# Patient Record
Sex: Female | Born: 1950
Health system: Southern US, Community
[De-identification: ages and names within clinical notes are randomized; demographics above are authoritative.]

## PROBLEM LIST (undated history)

## (undated) DIAGNOSIS — M94269 Chondromalacia, unspecified knee: Secondary | ICD-10-CM

## (undated) DIAGNOSIS — N762 Acute vulvitis: Secondary | ICD-10-CM

## (undated) DIAGNOSIS — IMO0001 Reserved for inherently not codable concepts without codable children: Secondary | ICD-10-CM

## (undated) DIAGNOSIS — J189 Pneumonia, unspecified organism: Secondary | ICD-10-CM

## (undated) DIAGNOSIS — I10 Essential (primary) hypertension: Secondary | ICD-10-CM

## (undated) DIAGNOSIS — N926 Irregular menstruation, unspecified: Secondary | ICD-10-CM

## (undated) DIAGNOSIS — R011 Cardiac murmur, unspecified: Secondary | ICD-10-CM

## (undated) DIAGNOSIS — K5790 Diverticulosis of intestine, part unspecified, without perforation or abscess without bleeding: Secondary | ICD-10-CM

## (undated) DIAGNOSIS — IMO0002 Reserved for concepts with insufficient information to code with codable children: Secondary | ICD-10-CM

## (undated) DIAGNOSIS — Z8679 Personal history of other diseases of the circulatory system: Secondary | ICD-10-CM

## (undated) DIAGNOSIS — K648 Other hemorrhoids: Secondary | ICD-10-CM

## (undated) DIAGNOSIS — M659 Synovitis and tenosynovitis, unspecified: Secondary | ICD-10-CM

## (undated) DIAGNOSIS — I2510 Atherosclerotic heart disease of native coronary artery without angina pectoris: Secondary | ICD-10-CM

## (undated) DIAGNOSIS — M329 Systemic lupus erythematosus, unspecified: Secondary | ICD-10-CM

## (undated) DIAGNOSIS — K219 Gastro-esophageal reflux disease without esophagitis: Secondary | ICD-10-CM

## (undated) DIAGNOSIS — R7302 Impaired glucose tolerance (oral): Secondary | ICD-10-CM

## (undated) DIAGNOSIS — I639 Cerebral infarction, unspecified: Secondary | ICD-10-CM

## (undated) DIAGNOSIS — M65969 Unspecified synovitis and tenosynovitis, unspecified lower leg: Secondary | ICD-10-CM

## (undated) DIAGNOSIS — E7800 Pure hypercholesterolemia, unspecified: Secondary | ICD-10-CM

## (undated) DIAGNOSIS — Z8619 Personal history of other infectious and parasitic diseases: Secondary | ICD-10-CM

## (undated) DIAGNOSIS — D649 Anemia, unspecified: Secondary | ICD-10-CM

## (undated) DIAGNOSIS — I219 Acute myocardial infarction, unspecified: Secondary | ICD-10-CM

## (undated) DIAGNOSIS — Z8673 Personal history of transient ischemic attack (TIA), and cerebral infarction without residual deficits: Secondary | ICD-10-CM

## (undated) DIAGNOSIS — E669 Obesity, unspecified: Secondary | ICD-10-CM

## (undated) DIAGNOSIS — E119 Type 2 diabetes mellitus without complications: Secondary | ICD-10-CM

## (undated) DIAGNOSIS — R896 Abnormal cytological findings in specimens from other organs, systems and tissues: Secondary | ICD-10-CM

## (undated) DIAGNOSIS — Z8639 Personal history of other endocrine, nutritional and metabolic disease: Secondary | ICD-10-CM

## (undated) DIAGNOSIS — D219 Benign neoplasm of connective and other soft tissue, unspecified: Secondary | ICD-10-CM

## (undated) HISTORY — PX: CORONARY ARTERY BYPASS GRAFT: SHX141

## (undated) HISTORY — DX: Acute vulvitis: N76.2

## (undated) HISTORY — PX: CARDIAC CATHETERIZATION: SHX172

## (undated) HISTORY — DX: Pneumonia, unspecified organism: J18.9

## (undated) HISTORY — PX: ROTATOR CUFF REPAIR: SHX139

## (undated) HISTORY — DX: Impaired glucose tolerance (oral): R73.02

## (undated) HISTORY — DX: Type 2 diabetes mellitus without complications: E11.9

## (undated) HISTORY — DX: Personal history of other diseases of the circulatory system: Z86.79

## (undated) HISTORY — DX: Synovitis and tenosynovitis, unspecified: M65.9

## (undated) HISTORY — PX: TUBAL LIGATION: SHX77

## (undated) HISTORY — DX: Essential (primary) hypertension: I10

## (undated) HISTORY — DX: Benign neoplasm of connective and other soft tissue, unspecified: D21.9

## (undated) HISTORY — DX: Irregular menstruation, unspecified: N92.6

## (undated) HISTORY — DX: Personal history of other infectious and parasitic diseases: Z86.19

## (undated) HISTORY — DX: Abnormal cytological findings in specimens from other organs, systems and tissues: R89.6

## (undated) HISTORY — DX: Diverticulosis of intestine, part unspecified, without perforation or abscess without bleeding: K57.90

## (undated) HISTORY — DX: Chondromalacia, unspecified knee: M94.269

## (undated) HISTORY — DX: Reserved for inherently not codable concepts without codable children: IMO0001

## (undated) HISTORY — PX: CATARACT EXTRACTION, BILATERAL: SHX1313

## (undated) HISTORY — DX: Unspecified synovitis and tenosynovitis, unspecified lower leg: M65.969

## (undated) HISTORY — DX: Acute myocardial infarction, unspecified: I21.9

## (undated) HISTORY — DX: Other hemorrhoids: K64.8

## (undated) HISTORY — DX: Systemic lupus erythematosus, unspecified: M32.9

## (undated) HISTORY — DX: Personal history of other endocrine, nutritional and metabolic disease: Z86.39

## (undated) HISTORY — DX: Anemia, unspecified: D64.9

## (undated) HISTORY — DX: Obesity, unspecified: E66.9

---

## 1998-02-02 ENCOUNTER — Inpatient Hospital Stay (HOSPITAL_COMMUNITY): Admission: AD | Admit: 1998-02-02 | Discharge: 1998-02-04 | Payer: Self-pay | Admitting: Cardiology

## 1998-03-24 ENCOUNTER — Emergency Department (HOSPITAL_COMMUNITY): Admission: EM | Admit: 1998-03-24 | Discharge: 1998-03-24 | Payer: Self-pay | Admitting: Emergency Medicine

## 1998-04-02 ENCOUNTER — Encounter: Admission: RE | Admit: 1998-04-02 | Discharge: 1998-07-01 | Payer: Self-pay | Admitting: Sports Medicine

## 1998-07-09 ENCOUNTER — Ambulatory Visit (HOSPITAL_COMMUNITY): Admission: RE | Admit: 1998-07-09 | Discharge: 1998-07-09 | Payer: Self-pay | Admitting: Nephrology

## 1998-07-20 ENCOUNTER — Ambulatory Visit (HOSPITAL_COMMUNITY): Admission: RE | Admit: 1998-07-20 | Discharge: 1998-07-20 | Payer: Self-pay | Admitting: Interventional Cardiology

## 1998-08-05 ENCOUNTER — Ambulatory Visit (HOSPITAL_COMMUNITY): Admission: RE | Admit: 1998-08-05 | Discharge: 1998-08-05 | Payer: Self-pay | Admitting: Interventional Cardiology

## 1998-12-03 ENCOUNTER — Ambulatory Visit (HOSPITAL_BASED_OUTPATIENT_CLINIC_OR_DEPARTMENT_OTHER): Admission: RE | Admit: 1998-12-03 | Discharge: 1998-12-03 | Payer: Self-pay | Admitting: Orthopedic Surgery

## 1998-12-10 ENCOUNTER — Ambulatory Visit (HOSPITAL_COMMUNITY): Admission: RE | Admit: 1998-12-10 | Discharge: 1998-12-10 | Payer: Self-pay | Admitting: Nephrology

## 1998-12-14 ENCOUNTER — Encounter: Admission: RE | Admit: 1998-12-14 | Discharge: 1999-03-14 | Payer: Self-pay | Admitting: Orthopedic Surgery

## 1998-12-23 ENCOUNTER — Encounter: Payer: Self-pay | Admitting: Nephrology

## 1998-12-23 ENCOUNTER — Inpatient Hospital Stay (HOSPITAL_COMMUNITY): Admission: AD | Admit: 1998-12-23 | Discharge: 1998-12-25 | Payer: Self-pay | Admitting: *Deleted

## 1999-03-10 ENCOUNTER — Ambulatory Visit (HOSPITAL_COMMUNITY): Admission: RE | Admit: 1999-03-10 | Discharge: 1999-03-10 | Payer: Self-pay | Admitting: Obstetrics and Gynecology

## 1999-03-10 ENCOUNTER — Encounter: Payer: Self-pay | Admitting: Obstetrics and Gynecology

## 1999-06-08 ENCOUNTER — Ambulatory Visit (HOSPITAL_COMMUNITY): Admission: RE | Admit: 1999-06-08 | Discharge: 1999-06-08 | Payer: Self-pay | Admitting: Nephrology

## 1999-07-27 ENCOUNTER — Encounter: Payer: Self-pay | Admitting: Obstetrics and Gynecology

## 1999-07-27 ENCOUNTER — Ambulatory Visit (HOSPITAL_COMMUNITY): Admission: RE | Admit: 1999-07-27 | Discharge: 1999-07-27 | Payer: Self-pay | Admitting: Obstetrics and Gynecology

## 1999-08-02 ENCOUNTER — Encounter: Payer: Self-pay | Admitting: Obstetrics and Gynecology

## 1999-08-02 ENCOUNTER — Ambulatory Visit (HOSPITAL_COMMUNITY): Admission: RE | Admit: 1999-08-02 | Discharge: 1999-08-02 | Payer: Self-pay | Admitting: Obstetrics and Gynecology

## 1999-10-12 ENCOUNTER — Encounter: Payer: Self-pay | Admitting: Interventional Cardiology

## 1999-10-12 ENCOUNTER — Ambulatory Visit (HOSPITAL_COMMUNITY): Admission: RE | Admit: 1999-10-12 | Discharge: 1999-10-12 | Payer: Self-pay | Admitting: Interventional Cardiology

## 1999-11-18 ENCOUNTER — Other Ambulatory Visit: Admission: RE | Admit: 1999-11-18 | Discharge: 1999-11-18 | Payer: Self-pay | Admitting: Obstetrics and Gynecology

## 1999-11-29 ENCOUNTER — Encounter: Payer: Self-pay | Admitting: Neurology

## 1999-11-29 ENCOUNTER — Ambulatory Visit (HOSPITAL_COMMUNITY): Admission: RE | Admit: 1999-11-29 | Discharge: 1999-11-29 | Payer: Self-pay | Admitting: Neurology

## 1999-12-17 ENCOUNTER — Emergency Department (HOSPITAL_COMMUNITY): Admission: EM | Admit: 1999-12-17 | Discharge: 1999-12-17 | Payer: Self-pay | Admitting: Emergency Medicine

## 2000-03-29 ENCOUNTER — Encounter (INDEPENDENT_AMBULATORY_CARE_PROVIDER_SITE_OTHER): Payer: Self-pay

## 2000-03-29 ENCOUNTER — Other Ambulatory Visit: Admission: RE | Admit: 2000-03-29 | Discharge: 2000-03-29 | Payer: Self-pay | Admitting: Obstetrics and Gynecology

## 2000-03-29 HISTORY — PX: OTHER SURGICAL HISTORY: SHX169

## 2000-08-02 ENCOUNTER — Encounter: Payer: Self-pay | Admitting: Obstetrics and Gynecology

## 2000-08-02 ENCOUNTER — Ambulatory Visit (HOSPITAL_COMMUNITY): Admission: RE | Admit: 2000-08-02 | Discharge: 2000-08-02 | Payer: Self-pay | Admitting: Obstetrics and Gynecology

## 2000-10-31 DIAGNOSIS — I7090 Unspecified atherosclerosis: Secondary | ICD-10-CM | POA: Insufficient documentation

## 2000-11-30 ENCOUNTER — Other Ambulatory Visit: Admission: RE | Admit: 2000-11-30 | Discharge: 2000-11-30 | Payer: Self-pay | Admitting: Obstetrics and Gynecology

## 2001-02-12 ENCOUNTER — Other Ambulatory Visit: Admission: RE | Admit: 2001-02-12 | Discharge: 2001-02-12 | Payer: Self-pay | Admitting: Obstetrics and Gynecology

## 2001-05-16 ENCOUNTER — Ambulatory Visit (HOSPITAL_COMMUNITY): Admission: RE | Admit: 2001-05-16 | Discharge: 2001-05-16 | Payer: Self-pay | Admitting: Nephrology

## 2001-05-16 ENCOUNTER — Encounter: Payer: Self-pay | Admitting: Nephrology

## 2001-07-17 ENCOUNTER — Encounter: Payer: Self-pay | Admitting: Nephrology

## 2001-07-17 ENCOUNTER — Ambulatory Visit (HOSPITAL_COMMUNITY): Admission: RE | Admit: 2001-07-17 | Discharge: 2001-07-17 | Payer: Self-pay | Admitting: Nephrology

## 2001-08-07 ENCOUNTER — Ambulatory Visit (HOSPITAL_COMMUNITY): Admission: RE | Admit: 2001-08-07 | Discharge: 2001-08-07 | Payer: Self-pay | Admitting: Obstetrics and Gynecology

## 2001-08-07 ENCOUNTER — Encounter: Payer: Self-pay | Admitting: Obstetrics and Gynecology

## 2001-12-12 ENCOUNTER — Other Ambulatory Visit: Admission: RE | Admit: 2001-12-12 | Discharge: 2001-12-12 | Payer: Self-pay | Admitting: Obstetrics and Gynecology

## 2002-08-08 ENCOUNTER — Encounter: Payer: Self-pay | Admitting: Obstetrics and Gynecology

## 2002-08-08 ENCOUNTER — Ambulatory Visit (HOSPITAL_COMMUNITY): Admission: RE | Admit: 2002-08-08 | Discharge: 2002-08-08 | Payer: Self-pay

## 2002-12-11 ENCOUNTER — Ambulatory Visit (HOSPITAL_COMMUNITY): Admission: RE | Admit: 2002-12-11 | Discharge: 2002-12-11 | Payer: Self-pay | Admitting: Gastroenterology

## 2002-12-11 ENCOUNTER — Encounter (INDEPENDENT_AMBULATORY_CARE_PROVIDER_SITE_OTHER): Payer: Self-pay | Admitting: *Deleted

## 2002-12-11 DIAGNOSIS — IMO0001 Reserved for inherently not codable concepts without codable children: Secondary | ICD-10-CM

## 2002-12-11 HISTORY — PX: UPPER GASTROINTESTINAL ENDOSCOPY: SHX188

## 2002-12-11 HISTORY — DX: Reserved for inherently not codable concepts without codable children: IMO0001

## 2002-12-17 ENCOUNTER — Other Ambulatory Visit: Admission: RE | Admit: 2002-12-17 | Discharge: 2002-12-17 | Payer: Self-pay | Admitting: Obstetrics and Gynecology

## 2003-03-17 ENCOUNTER — Encounter: Admission: RE | Admit: 2003-03-17 | Discharge: 2003-04-03 | Payer: Self-pay | Admitting: Occupational Medicine

## 2003-03-27 ENCOUNTER — Ambulatory Visit (HOSPITAL_COMMUNITY): Admission: RE | Admit: 2003-03-27 | Discharge: 2003-03-28 | Payer: Self-pay | Admitting: Interventional Cardiology

## 2003-03-27 ENCOUNTER — Encounter: Payer: Self-pay | Admitting: Interventional Cardiology

## 2003-03-29 ENCOUNTER — Inpatient Hospital Stay (HOSPITAL_COMMUNITY): Admission: EM | Admit: 2003-03-29 | Discharge: 2003-04-12 | Payer: Self-pay

## 2003-03-30 ENCOUNTER — Encounter: Payer: Self-pay | Admitting: Surgery

## 2003-03-31 ENCOUNTER — Encounter: Payer: Self-pay | Admitting: Surgery

## 2003-04-01 ENCOUNTER — Encounter: Payer: Self-pay | Admitting: Surgery

## 2003-04-04 ENCOUNTER — Encounter: Payer: Self-pay | Admitting: Surgery

## 2003-04-08 ENCOUNTER — Encounter: Payer: Self-pay | Admitting: Surgery

## 2003-05-05 ENCOUNTER — Encounter (HOSPITAL_COMMUNITY): Admission: RE | Admit: 2003-05-05 | Discharge: 2003-05-15 | Payer: Self-pay | Admitting: Interventional Cardiology

## 2003-05-19 ENCOUNTER — Encounter (HOSPITAL_COMMUNITY): Admission: RE | Admit: 2003-05-19 | Discharge: 2003-08-17 | Payer: Self-pay | Admitting: Interventional Cardiology

## 2003-08-12 ENCOUNTER — Encounter: Payer: Self-pay | Admitting: Obstetrics and Gynecology

## 2003-08-12 ENCOUNTER — Ambulatory Visit (HOSPITAL_COMMUNITY): Admission: RE | Admit: 2003-08-12 | Discharge: 2003-08-12 | Payer: Self-pay | Admitting: Obstetrics and Gynecology

## 2004-01-13 ENCOUNTER — Emergency Department (HOSPITAL_COMMUNITY): Admission: AD | Admit: 2004-01-13 | Discharge: 2004-01-13 | Payer: Self-pay | Admitting: Family Medicine

## 2004-04-07 ENCOUNTER — Other Ambulatory Visit: Admission: RE | Admit: 2004-04-07 | Discharge: 2004-04-07 | Payer: Self-pay | Admitting: Obstetrics and Gynecology

## 2004-08-24 ENCOUNTER — Ambulatory Visit (HOSPITAL_COMMUNITY): Admission: RE | Admit: 2004-08-24 | Discharge: 2004-08-24 | Payer: Self-pay | Admitting: Nephrology

## 2005-05-17 ENCOUNTER — Other Ambulatory Visit: Admission: RE | Admit: 2005-05-17 | Discharge: 2005-05-17 | Payer: Self-pay | Admitting: Obstetrics and Gynecology

## 2005-08-30 ENCOUNTER — Ambulatory Visit (HOSPITAL_COMMUNITY): Admission: RE | Admit: 2005-08-30 | Discharge: 2005-08-30 | Payer: Self-pay | Admitting: Obstetrics and Gynecology

## 2005-12-04 ENCOUNTER — Emergency Department (HOSPITAL_COMMUNITY): Admission: EM | Admit: 2005-12-04 | Discharge: 2005-12-04 | Payer: Self-pay | Admitting: Emergency Medicine

## 2006-03-01 ENCOUNTER — Encounter: Payer: Self-pay | Admitting: Interventional Cardiology

## 2006-07-25 ENCOUNTER — Encounter: Admission: RE | Admit: 2006-07-25 | Discharge: 2006-07-25 | Payer: Self-pay | Admitting: Obstetrics and Gynecology

## 2006-09-19 ENCOUNTER — Encounter: Admission: RE | Admit: 2006-09-19 | Discharge: 2006-09-19 | Payer: Self-pay | Admitting: Occupational Medicine

## 2006-09-27 ENCOUNTER — Other Ambulatory Visit: Admission: RE | Admit: 2006-09-27 | Discharge: 2006-09-27 | Payer: Self-pay | Admitting: Obstetrics and Gynecology

## 2007-01-08 ENCOUNTER — Ambulatory Visit (HOSPITAL_COMMUNITY): Admission: RE | Admit: 2007-01-08 | Discharge: 2007-01-08 | Payer: Self-pay | Admitting: Orthopedic Surgery

## 2007-08-02 ENCOUNTER — Ambulatory Visit (HOSPITAL_COMMUNITY): Admission: RE | Admit: 2007-08-02 | Discharge: 2007-08-02 | Payer: Self-pay | Admitting: Obstetrics and Gynecology

## 2007-11-18 ENCOUNTER — Emergency Department (HOSPITAL_COMMUNITY): Admission: EM | Admit: 2007-11-18 | Discharge: 2007-11-18 | Payer: Self-pay | Admitting: Emergency Medicine

## 2008-01-01 ENCOUNTER — Encounter: Admission: RE | Admit: 2008-01-01 | Discharge: 2008-01-01 | Payer: Self-pay | Admitting: Internal Medicine

## 2008-05-13 ENCOUNTER — Encounter (HOSPITAL_COMMUNITY): Admission: RE | Admit: 2008-05-13 | Discharge: 2008-07-02 | Payer: Self-pay | Admitting: Interventional Cardiology

## 2008-05-23 ENCOUNTER — Emergency Department (HOSPITAL_COMMUNITY): Admission: EM | Admit: 2008-05-23 | Discharge: 2008-05-23 | Payer: Self-pay | Admitting: Emergency Medicine

## 2008-07-01 DIAGNOSIS — J189 Pneumonia, unspecified organism: Secondary | ICD-10-CM

## 2008-07-01 HISTORY — DX: Pneumonia, unspecified organism: J18.9

## 2008-08-04 ENCOUNTER — Encounter: Admission: RE | Admit: 2008-08-04 | Discharge: 2008-08-04 | Payer: Self-pay | Admitting: Obstetrics and Gynecology

## 2008-08-19 ENCOUNTER — Ambulatory Visit (HOSPITAL_COMMUNITY): Admission: RE | Admit: 2008-08-19 | Discharge: 2008-08-19 | Payer: Self-pay | Admitting: Internal Medicine

## 2009-07-03 ENCOUNTER — Emergency Department (HOSPITAL_COMMUNITY): Admission: EM | Admit: 2009-07-03 | Discharge: 2009-07-03 | Payer: Self-pay | Admitting: Family Medicine

## 2009-07-26 ENCOUNTER — Emergency Department (HOSPITAL_COMMUNITY): Admission: EM | Admit: 2009-07-26 | Discharge: 2009-07-26 | Payer: Self-pay | Admitting: Emergency Medicine

## 2009-08-10 ENCOUNTER — Encounter: Admission: RE | Admit: 2009-08-10 | Discharge: 2009-08-10 | Payer: Self-pay | Admitting: Obstetrics and Gynecology

## 2010-02-08 ENCOUNTER — Emergency Department (HOSPITAL_COMMUNITY): Admission: EM | Admit: 2010-02-08 | Discharge: 2010-02-08 | Payer: Self-pay | Admitting: Family Medicine

## 2010-02-28 DIAGNOSIS — Z8619 Personal history of other infectious and parasitic diseases: Secondary | ICD-10-CM

## 2010-02-28 HISTORY — DX: Personal history of other infectious and parasitic diseases: Z86.19

## 2010-06-17 ENCOUNTER — Encounter (INDEPENDENT_AMBULATORY_CARE_PROVIDER_SITE_OTHER): Payer: Self-pay | Admitting: Interventional Cardiology

## 2010-06-17 ENCOUNTER — Ambulatory Visit (HOSPITAL_COMMUNITY): Admission: RE | Admit: 2010-06-17 | Discharge: 2010-06-17 | Payer: Self-pay | Admitting: Interventional Cardiology

## 2010-06-17 ENCOUNTER — Ambulatory Visit: Payer: Self-pay | Admitting: Vascular Surgery

## 2010-08-17 ENCOUNTER — Encounter: Admission: RE | Admit: 2010-08-17 | Discharge: 2010-08-17 | Payer: Self-pay | Admitting: Nephrology

## 2010-11-21 ENCOUNTER — Encounter: Payer: Self-pay | Admitting: Obstetrics and Gynecology

## 2010-11-21 ENCOUNTER — Encounter: Payer: Self-pay | Admitting: Orthopedic Surgery

## 2011-02-27 ENCOUNTER — Ambulatory Visit (INDEPENDENT_AMBULATORY_CARE_PROVIDER_SITE_OTHER): Payer: 59

## 2011-02-27 ENCOUNTER — Emergency Department (HOSPITAL_COMMUNITY): Payer: 59

## 2011-02-27 ENCOUNTER — Emergency Department (HOSPITAL_COMMUNITY)
Admission: EM | Admit: 2011-02-27 | Discharge: 2011-02-27 | Disposition: A | Discharge status: Home / Self Care | Payer: 59 | Attending: Emergency Medicine | Admitting: Emergency Medicine

## 2011-02-27 ENCOUNTER — Inpatient Hospital Stay (INDEPENDENT_AMBULATORY_CARE_PROVIDER_SITE_OTHER)
Admission: RE | Admit: 2011-02-27 | Discharge: 2011-02-27 | Disposition: A | Discharge status: Home / Self Care | Payer: 59 | Source: Ambulatory Visit | Attending: Family Medicine | Admitting: Family Medicine

## 2011-02-27 DIAGNOSIS — R509 Fever, unspecified: Secondary | ICD-10-CM

## 2011-02-27 DIAGNOSIS — E7800 Pure hypercholesterolemia, unspecified: Secondary | ICD-10-CM | POA: Insufficient documentation

## 2011-02-27 DIAGNOSIS — M329 Systemic lupus erythematosus, unspecified: Secondary | ICD-10-CM | POA: Insufficient documentation

## 2011-02-27 DIAGNOSIS — J029 Acute pharyngitis, unspecified: Secondary | ICD-10-CM | POA: Insufficient documentation

## 2011-02-27 DIAGNOSIS — I10 Essential (primary) hypertension: Secondary | ICD-10-CM | POA: Insufficient documentation

## 2011-02-27 DIAGNOSIS — I252 Old myocardial infarction: Secondary | ICD-10-CM | POA: Insufficient documentation

## 2011-02-27 DIAGNOSIS — Z951 Presence of aortocoronary bypass graft: Secondary | ICD-10-CM | POA: Insufficient documentation

## 2011-02-27 LAB — POCT URINALYSIS DIP (DEVICE)
Bilirubin Urine: NEGATIVE
Hgb urine dipstick: NEGATIVE
Ketones, ur: NEGATIVE mg/dL
pH: 5 (ref 5.0–8.0)

## 2011-02-27 LAB — CBC
HCT: 33.3 % — ABNORMAL LOW (ref 36.0–46.0)
Hemoglobin: 10.8 g/dL — ABNORMAL LOW (ref 12.0–15.0)
MCH: 27.8 pg (ref 26.0–34.0)
MCHC: 32.4 g/dL (ref 30.0–36.0)

## 2011-02-27 MED ORDER — IOHEXOL 300 MG/ML  SOLN
100.0000 mL | Freq: Once | INTRAMUSCULAR | Status: AC | PRN
  Administered 2011-02-27: 80 mL via INTRAVENOUS

## 2011-03-18 NOTE — Cardiovascular Report (Signed)
   NAME:  Deanna Simmons, Deanna Simmons NO.:  [id number]   MEDICAL RECORD NO.:  [id number]                   PATIENT TYPE:  REC   LOCATION:                                       FACILITY:  [facility]   PHYSICIAN:  Lesleigh Noe, M.D.            DATE OF BIRTH:  03/08/1951   DATE OF PROCEDURE:  DATE OF DISCHARGE:                              CARDIAC CATHETERIZATION   INDICATIONS:  Acute inferior myocardial infarction status post recent drug  eluting stent implantation for in-stent restenosis.   PROCEDURE PERFORMED:  1. Left heart catheterization.  2. Rescue angioplasty of the right coronary.  3. Failed AngioJet thrombectomy of the right coronary.   DESCRIPTION OF PROCEDURE:  The patient had undergone stent implantation on  Mar 27, 2003 as treatment for in-stent restenosis of the right coronary.  She was treated with overlapping Cypher stents.  She was discharged on Mar 28, 2003 and returned acutely on Mar 29, 2003 with onset of chest pain  abruptly.  In the ER she had tombstone ST elevation and was quickly prepped  and brought to the catheterization laboratory.  In the catheterization  laboratory   Dictation Ended At This Point.                                                Lesleigh Noe, M.D.    HWS/MEDQ  D:  06/16/2003  T:  06/17/2003  Job:  [id number]   cc:   Fayrene Fearing L. Deterding, M.D.  [address]  [place]  [place] [address]  Fax: [phone]

## 2011-03-18 NOTE — Op Note (Signed)
NAME:  Deanna Simmons, Deanna Simmons                         ACCOUNT NO.:  [id number]   MEDICAL RECORD NO.:  [id number]                   PATIENT TYPE:  INP   LOCATION:  2399                                 FACILITY:  [facility]   PHYSICIAN:  Evelene Croon, M.D.                  DATE OF BIRTH:  1951/03/23   DATE OF PROCEDURE:  03/30/2003  DATE OF DISCHARGE:                                 OPERATIVE REPORT   PREOPERATIVE DIAGNOSIS:  Two vessel coronary disease, status post acute  right coronary artery stent thrombosis.   POSTOPERATIVE DIAGNOSIS:  Two vessel coronary disease, status post acute  right coronary artery stent thrombosis.   OPERATION:  Median sternotomy, extracorporeal circulation, coronary artery  bypass graft surgery x1 using the saphenous vein graft to the posterior  descending coronary artery, endoscopic vein harvesting from the left thigh.   SURGEON:  Evelene Croon, M.D.   ASSISTANT:  Villa Herb, P.A.-C.   ANESTHESIA:  General endotracheal anesthesia.   CLINICAL HISTORY:  This patient is a 60 year old woman with a long history  of coronary disease who has undergone multiple percutaneous interventions on  the right coronary artery since 1991, by Dr. Katrinka Blazing.  The patient underwent  stenting of her right coronary artery last Thursday with Cypher stents due  to in-stent restenosis and de novo high grade stenosis between 2 stents that  had already been placed.   The patient returned last night with acute right coronary artery stent  thrombosis. She was taken emergently to the cath lab and Dr. Katrinka Blazing was able  to successfully open the right coronary artery with TIMII-III flow but some  residual thrombus.  The patient remained stable on Integrelin, heparin and  nitroglycerin.  After reviewing the angiogram, it was felt that coronary  artery bypass graft surgery was the best treatment to prevent further  ischemia and infarction.   Review of her old catheterization from last Thursday  showed that there was  also about 50% ostial stenosis of a second marginal branch. I discussed the  operative procedure of coronary bypass surgery with the patient and her  husband, and children; including alternatives, benefits and risks including  bleeding, blood transfusion, infection, graft failure, stroke, myocardial  infarction and death.  She understood and agreed to proceed.  She was anemic  and blood bank notified us this morning that she had antibodies in her  blood.  She had a previous blood transfusion. This took several hours for  the blood bank to identify the antibodies and to find blood that was  suitable for her.   DESCRIPTION OF PROCEDURE:  The patient was taken to the operating room,  placed on the table in the supine position.  After induction of general  endotracheal anesthesia, a Foley catheter was placed in the bladder using  sterile technique.  Then the chest, abdomen and both lower extremities were  prepped  draped in  the usual sterile manner.   The chest was entered through a median sternotomy incision and the  pericardium was opened in the midline. She had old pericarditis with dense  adhesions completely obliterating the pericardial cavity.  At the same time,  a segment of the greater saphenous vein was harvested from the left leg  using endoscopic vein harvest technique.  There was approximately 1 scissor  length of vein in the upper thigh which was of good size and good quality.  Below this, the vein became small and very diffusely diseased, and I did not  feel it was suitable for using to bypass the obtuse marginal branch. This  vessel only had about 50% stenosis and certainly, was not clinically  significant at this time. Given the quality of the vein, and the fact that  it would require a stented dissection on the lateral wall of the heart; due  to the adhesions, I did not feel that the benefit outweighed the risk.  Therefore, plans were made only to  graft the posterior descending coronary  artery.   Then dissection was performed to expose the right atrium and ascending  aorta. Then the patient was heparinized and when an adequate active clotting  time was achieved, the distal ascending aorta was cannulated using a 20  French aortic cannula for arterial inflow.  Venous outflow was achieved  using a 2 stage venous cannula through the right atrial appendage.  An  antegrade cardioplegia and vent cannula was inserted in the aortic root.   The patient was placed on cardiopulmonary bypass. The inferior wall of the  heart was dissected from the adhesions enough to expose the posterior  descending coronary artery. This was a medium sized vessel.  Then the aorta  was cross-clamped and 600 cc of cold blood antegrade cardioplegia was  administered in the aortic root with quick arrest of the heart.  The patient  was kept at normothermia. Topical hypothermic iced saline was used.   Then the distal anastomosis was performed of the posterior descending  coronary artery. The internal diameter of this vessel was about 1.75 mm. The  conduit used was a segment of greater saphenous vein and the anastomosis  performed in an end-to-side manner using continuous 7-0 Prolene suture. Flow  was administered through the graft and was excellent.  Then the proximal  anastomosis was performed to the aortic root in a end-to-side manner using  continuous 6-0 Prolene suture.  The cross-clamp was then removed, with a  time of 20 minutes.  There was spontaneous return of sinus rhythm. The  proximal and distal anastomoses appeared hemostatic and lie of the graft was  satisfactory.  A graft marker was placed around the proximal anastomosis.  Two temporary right ventricular and right atrial pacing wires were placed  above the skin.   When the patient had rewarmed to 37 degrees Centigrade, she was weaned from cardiopulmonary bypass on no inotropic agents.  Total bypass  time was 36  minutes.  Cardiac function appeared excellent, with cardiac output 4.5  L/minute.  Protamine was given, and the aortic and venous cannulas were  removed without difficulty.  Hemostasis was achieved. The patient was given  Aprotinin for this case, and she was on heparin, Plavix, aspirin and  Integrelin. Then a single chest tube was placed in the anterior mediastinum.  The sternum was then closed with #6 stainless steel wires.  The fascia was  closed with continuous #1 Vicryl suture, subcutaneous tissue was closed  with  continuous 2-0 Vicryl and skin with 3-0 Vicryl subcuticular closure. The  lower extremity vein harvest sites were closed in layers in a similar  manner.  The sponge, instrument and needle counts were correct according to  the scrub  nurse. Dry sterile dressings were applied around the incisions around the  chest tubes, which were hooked to Pleur-evac suction. The patient remained  hemodynamically stable and was transported to the SICU in guarded but stable  condition.                                                  Evelene Croon, M.D.    BB/MEDQ  D:  03/30/2003  T:  03/30/2003  Job:  [id number]   cc:   Lyn Records III, M.D.  [address]  [address]  [place]  [place] [address]  Fax: [phone]   Cath Lab  [facility]

## 2011-03-18 NOTE — Consult Note (Signed)
NAME:  Deanna Simmons, Deanna Simmons                         ACCOUNT NO.:  [id number]   MEDICAL RECORD NO.:  [id number]                   PATIENT TYPE:  INP   LOCATION:  2924                                 FACILITY:  [facility]   PHYSICIAN:  Evelene Croon, M.D.                  DATE OF BIRTH:  29-Oct-1951   DATE OF CONSULTATION:  03/30/2003  DATE OF DISCHARGE:                                   CONSULTATION   REFERRING PHYSICIAN:  Lyn Records, M.D.   REASON FOR CONSULTATION:  Two-vessel coronary artery disease with acute  inferior MI secondary to right coronary artery stent thrombosis.   HISTORY OF PRESENT ILLNESS:  This patient is a 60 year old black female with  history of coronary artery disease who has undergone multiple percutaneous  interventions on the right coronary artery since 1991.  Most recently on Mar 27, 2003, she returned with restenosis in her right coronary artery and had  60 mm overlapping Cipher stents in the right coronary artery to treat this  restenosis as well as a de novo high-grade mid right coronary artery  stenosis between two previously placed stents.  She has been on Plavix and  aspirin.  About 7:15 p.m. last night, she developed sudden severe chest  pressure, nausea and diaphoresis that was unrelieved by sublingual  nitroglycerin.  She came to the emergency room and was diagnosed with an  acute inferior MI with ST elevations inferiorly.  Emergent catheterization  was performed which showed acute stent thrombosis initial the right coronary  artery.  This was successfully opened to less than 20% stenosis with diffuse  irregularity within the stents and thrombus present.  There was TIMI-3 flow.  Her symptoms resolved.  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  She has remained stable overnight on IV Integrilin, nitroglycerin,  and Plavix.   PAST MEDICAL HISTORY:  1. Significant for coronary disease mentioned above, status post multiple  percutaneous interventions.  2. She has history of hypercholesterolemia.  3. Status post hysterectomy.  4. Status post right rotator cuff repair.   REVIEW OF SYSTEMS:  General:  She denies any recent weight changes.  She  denies any fever or chills.  Eyes negative.  HEENT:  Negative.  Endocrine:  She denies diabetes or hypothyroidism.  Cardiovascular:  As above.  She  denies PND and orthopnea.  She has had no peripheral edema or palpitations.  Respiratory:  She denies cough or sputum production.  GI:  She has had no  nausea or vomiting.  She denies melena and bright red blood per rectum.  GU:  She denies dysuria and hematuria.  Neurological:  She has had no focal  weakness or numbness.  She denies dizziness and syncope.  She has never had  a TIA or stroke.  Musculoskeletal:  Negative.  Vascular:  She denies  claudication and phlebitis.   ALLERGIES:  PENICILLIN, TICLID.   FAMILY HISTORY:  Strongly positive for coronary disease in both her parents.  A brother died in his late 61s of myocardial infarction.   SOCIAL HISTORY:  She works in Herbalist at [employer].  She is a  nonsmoker.   PHYSICAL EXAMINATION:  VITAL SIGNS:  Blood pressure 165/85, pulse 65 and  regular, respiratory rate 16 and nonlabored.  Oxygen saturation is 100% on 2  L nasal cannula.  GENERAL:  She is an obese black female in no distress.  HEENT:  Normocephalic, atraumatic.  Pupils are equal and reactive to light  and accommodation.  Extraocular muscles are intact.  Throat is clear.  NECK:  Normal carotid pulses bilaterally.  No bruits.  There is no  adenopathy or thyromegaly.  CARDIAC:  Regular rate and rhythm with normal S1 and S2.  No murmur, rub, or  gallop.  LUNGS:  Clear.  ABDOMEN:  Active bowel sounds.  Abdomen is soft and obese.  No palpable  masses or organomegaly.  EXTREMITIES:  No peripheral edema.  There is some oozing from the right  groin where her sheath is located.  NEUROLOGIC:  Alert and  oriented x 3.  Motor and sensory exam grossly normal.  SKIN:  Warm and dry.   IMPRESSION:  [name] has two-vessel coronary disease, status post acute  right coronary artery stent thrombosis opened with percutaneous  intervention.  She is at high risk for rethrombosis.  I agree with  proceeding with coronary artery bypass graft surgery as the best treatment  to prevent this complication.  I will probably also plan on bypassing her  second obtuse marginal branch which has about 50% ostial stenosis.  I  discussed the operative procedure with the patient including alternatives,  benefits and risks including bleeding, blood transfusion, infection, stroke,  myocardial infarction, and death.  She understands and agrees to proceed.  Will plan to do this sometime this morning.                                                Evelene Croon, M.D.    BB/MEDQ  D:  03/30/2003  T:  03/30/2003  Job:  [id number]   cc:   Lyn Records III, M.D.  [address]  [address]  [place]  [place] [address]  Fax: [phone]

## 2011-03-18 NOTE — Discharge Summary (Signed)
NAME:  Deanna Simmons, Deanna Simmons                         ACCOUNT NO.:  [id number]   MEDICAL RECORD NO.:  [id number]                   PATIENT TYPE:  INP   LOCATION:  2041                                 FACILITY:  [facility]   PHYSICIAN:  Ranelle Oyster, M.D.             DATE OF BIRTH:  07-15-1951   DATE OF ADMISSION:  03/29/2003  DATE OF DISCHARGE:  04/12/2003                                 DISCHARGE SUMMARY   ADMISSION DIAGNOSIS:  Coronary artery disease with subacute stent thrombosis  of the right coronary artery.   PAST MEDICAL HISTORY:  1. Coronary artery disease, status post multiple percutaneous interventions     since 1991.  2. Hypercholesteremia.  3. Lupus erythematosus, followed  by Dr. Darrick Penna.   PAST SURGICAL HISTORY:  1. Hysterectomy.  2. Right rotator cuff repair.   ALLERGIES:  PENICILLIN AND TICLID.   POSTOPERATIVE DIAGNOSIS:  Two-vessel coronary artery disease, status post  acute right coronary artery stent thrombosis, status post coronary artery  bypass grafting (emergent ).   BRIEF HISTORY:  Deanna Simmons is a 60 year old African American female. As  above she has had multiple  percutaneous interventions in the right coronary  artery since 1991, most recently on Mar 27, 2003. She had a 60-mm  overlapping Cypher stents placed in the right coronary artery to treat  restenosis of her right coronary artery. In the evening of April 29, 2003,  she developed sudden severe chest pressure, nausea and diaphoresis which was  unrelieved by her sublingual nitroglycerin.   HOSPITAL COURSE:  She came to the emergency room at [facility] An  admitting EKG revealed changes consistent with an acute inferior myocardial  infarction. She was taken emergently to the cardiac catheterization lab. The  catheterization revealed acute stent thrombosis in the right coronary  artery. This was successfully opened to less than 20% of stenosis with  diffuse irregularity within the stent  and thrombus present.   Because of these findings, a cardiac surgery consult was requested. Dr.  Laneta Simmers evaluated her early the next morning. Dr. Laneta Simmers felt that Mrs.  Simmons was at a high risk for re thrombosis of her stents, therefore he  recommended proceeding with coronary artery bypass grafting. The procedure  and its benefits were discussed  with Deanna Simmons and she agreed with the  surgery.   Later that day Deanna Simmons was brought to the operating room and underwent  the following  surgical procedure with Dr. Laneta Simmers:  Coronary artery bypass  grafting x1, the saphenous vein was grafted to the posterior descending  artery. The findings at the time of the procedure included old pericarditis  with diffuse disease and pericardial adhesions. She had only 1 vein graft  placed because there was only enough  good vein to graft on the posterior  descending artery.   She tolerated this procedure reasonably well, transferring in stable  condition to the SICU. She  remained hemodynamically stable in the immediate  postoperative period. She was  extubated several hours after arrival on the intensive care unit and awoke  from anesthesia neurologically intact. Postoperative issues have been mainly  anemia and fever.   On postoperative day #4 her hemoglobin was 7.1. She was transfused with 1  unit of packed cells. The next morning her temperature was  101. Her left  leg incision was clean and dry but there was some erythema noted. She was  started on a course of vancomycin for position wound infection for  the  following  findings. Her symptoms of cellulitis cleared up very quickly,  however, her temperature continued to persist intermittently.   On postoperative day #9, she had a temperature spike to 102.9. Again on  postoperative day #9 her hemoglobin was 7.1. She was typed and crossed and  transfused with 2 units of packed cells. During the type and cross matching,  Dr. Luisa Hart found  that she had high levels of 2 very significant antibodies  that were not present when her blood was cross matched in the perioperative  period. He felt that she must have had an undetectable level  of these  antibodies and then transfusion for surgery acted as a booster. This could  be a reason for her fevers as well as her drop in hemoglobin. Deanna Simmons  was afebrile on the morning of postoperative day #10 and has remained that  way since.   Because of her persistent elevated temperature  and her history of lupus, a  primary care consultation  was requested. Her primary care physician for  this is Dr. Darrick Penna. His partner, Dr. Marjory Sneddon consulted with  her.  Since the blood antibody seemed to be a probable cause of her fever.  Dr. Kathrene Bongo felt that lupus was probably not really causing the fever,  however, she felt it was prudent to run compliment levels.  The results of  these blood tests were not immediately available.   Deanna Simmons is now making very good progress in recovery from her surgery.  On the morning of April 11, 2003, postoperative day #12, she was feeling  pretty well. Her blood pressure is 100/70. She had a low-grade temperature  of 100.1.  Her lungs were clear. Her heart is in normal sinus rhythm. Her  incisions are clean and dry with no signs of infection. Her ambulation is  improving. Her pain is well controlled. Her vancomycin was discontinued  today. If she remains afebrile overnight, she will be ready for discharge to  home in the morning.  Her hemoglobin will be checked prior to discharge.   LABORATORY DATA:  On April 10, 2003, CBC, white blood cells 10.2, hemoglobin  8.8, hematocrit 24.6, platelets 302. Chemistries:  Sodium 140, potassium  4.0, BUN 11, creatinine 0.8, glucose  104. Calcium 9.0.   CONDITION ON DISCHARGE:  Improved.   DISCHARGE MEDICATIONS:  Toprol XL 25 mg p.o. every day. Resume the  following  home medications:  1. Lipitor 80  mg p.o. every day.  2. Enteric coated aspirin 325 mg p.o. every day.  3. Iron, multivitamin and Tums she used prior to admission.  4. For pain she can have Tylox 1 to  2 tablets q.4-6h. p.r.n. pain or     Tylenol  325 mg 1 to  2 tablets q.4-6h. p.r.n. mild pain.   DISCHARGE INSTRUCTIONS:  For activity she has been asked to refrain from any  driving, heavy lifting, pushing or  pulling. Also she was instructed to  continue her breathing exercises and daily walking. Her diet should be a  heart healthy diet. Wound care she may shower at home.  If the incisions  become red, hot, swollen or draining, she is to call Dr. Sharee Pimple office.   FOLLOW UP:  1. Dr. Katrinka Blazing would like to see her in his  office in approximately 2 weeks.     She has been asked to call to arrange an appointment. She will have a     chest x-ray within that day.  2.     Dr. Laneta Simmers would like to see her  at the [facility] office on Tuesday, May 06, 2003, at 11:30 a.m.  3. She has been instructed to follow up with Dr. Darrick Penna in approximately     1 month to follow up on his compliment blood tests.                                               Ranelle Oyster, M.D.    ZTS/MEDQ  D:  04/11/2003  T:  04/12/2003  Job:  [id number]   cc:   Lyn Records III, M.D.  [address]  [address]  [place]  [place] [address]  Fax: [phone]   Llana Aliment. Deterding, M.D.  [address]  [place]  [place] [address]  Fax: [phone]

## 2011-05-02 ENCOUNTER — Encounter: Payer: 59 | Attending: Internal Medicine

## 2011-07-01 ENCOUNTER — Inpatient Hospital Stay (INDEPENDENT_AMBULATORY_CARE_PROVIDER_SITE_OTHER)
Admission: RE | Admit: 2011-07-01 | Discharge: 2011-07-01 | Disposition: A | Discharge status: Home / Self Care | Payer: 59 | Source: Ambulatory Visit | Attending: Emergency Medicine | Admitting: Emergency Medicine

## 2011-07-01 DIAGNOSIS — J309 Allergic rhinitis, unspecified: Secondary | ICD-10-CM

## 2011-07-01 DIAGNOSIS — R05 Cough: Secondary | ICD-10-CM

## 2011-07-01 DIAGNOSIS — R059 Cough, unspecified: Secondary | ICD-10-CM

## 2011-08-01 ENCOUNTER — Other Ambulatory Visit: Payer: Self-pay | Admitting: Obstetrics and Gynecology

## 2011-08-01 DIAGNOSIS — Z1231 Encounter for screening mammogram for malignant neoplasm of breast: Secondary | ICD-10-CM

## 2011-08-23 ENCOUNTER — Ambulatory Visit
Admission: RE | Admit: 2011-08-23 | Discharge: 2011-08-23 | Disposition: A | Discharge status: Home / Self Care | Payer: 59 | Source: Ambulatory Visit | Attending: Obstetrics and Gynecology | Admitting: Obstetrics and Gynecology

## 2011-08-23 DIAGNOSIS — Z1231 Encounter for screening mammogram for malignant neoplasm of breast: Secondary | ICD-10-CM

## 2011-09-11 ENCOUNTER — Emergency Department (INDEPENDENT_AMBULATORY_CARE_PROVIDER_SITE_OTHER)
Admission: EM | Admit: 2011-09-11 | Discharge: 2011-09-11 | Disposition: A | Discharge status: Home / Self Care | Payer: 59 | Source: Home / Self Care | Attending: Emergency Medicine | Admitting: Emergency Medicine

## 2011-09-11 DIAGNOSIS — R05 Cough: Secondary | ICD-10-CM

## 2011-09-11 DIAGNOSIS — J040 Acute laryngitis: Secondary | ICD-10-CM

## 2011-09-11 DIAGNOSIS — R059 Cough, unspecified: Secondary | ICD-10-CM

## 2011-09-11 HISTORY — DX: Atherosclerotic heart disease of native coronary artery without angina pectoris: I25.10

## 2011-09-11 HISTORY — DX: Pure hypercholesterolemia, unspecified: E78.00

## 2011-09-11 MED ORDER — DOXYCYCLINE HYCLATE 100 MG PO CAPS: 100.0000 mg | ORAL_CAPSULE | Freq: Two times a day (BID) | ORAL | Status: AC

## 2011-09-11 MED ORDER — PREDNISONE 20 MG PO TABS: 20.0000 mg | ORAL_TABLET | Freq: Every day | ORAL | Status: AC

## 2011-09-11 NOTE — ED Provider Notes (Addendum)
History     CSN: [id number] Arrival date & time: 09/11/2011  7:52 PM   First MD Initiated Contact with Patient 09/11/11 1915      Chief Complaint  Patient presents with  . URI    Pt has URI and was seen at PCP and now has laryngitis    (Consider location/radiation/quality/duration/timing/severity/associated sxs/prior treatment) Patient is a 60 y.o. female presenting with URI. The history is provided by the patient.  URI The primary symptoms include sore throat and cough. Primary symptoms do not include fever, nausea or rash. Primary symptoms comment: hoarssness The problem has not changed since onset. The cough is recurrent. The cough is non-productive. The sputum is clear.  Symptoms associated with the illness include congestion. The illness is not associated with chills.    Past Medical History  Diagnosis Date  . Asthma   . Coronary artery disease   . Hypercholesteremia     Past Surgical History  Procedure Date  . Coronary artery bypass graft   . Rotator cuff repair     History reviewed. No pertinent family history.  History  Substance Use Topics  . Smoking status: Not on file  . Smokeless tobacco: Not on file  . Alcohol Use: No    OB History    Grav Para Term Preterm Abortions TAB SAB Ect Mult Living                  Review of Systems  Constitutional: Negative for fever and chills.  HENT: Positive for congestion and sore throat.   Respiratory: Positive for cough.   Gastrointestinal: Negative for nausea.  Skin: Negative for rash.    Allergies  Erythromycin base; Penicillins; and Singulair  Home Medications   Current Outpatient Rx  Name Route Sig Dispense Refill  . PROAIR HFA IN Inhalation Inhale into the lungs.      . BECLOMETHASONE DIPROPIONATE 40 MCG/ACT IN AERS Inhalation Inhale 2 puffs into the lungs 2 (two) times daily.      Marland Kitchen CETIRIZINE HCL 1 MG/ML PO SYRP Oral Take by mouth daily.      Marland Kitchen FLUTICASONE PROPIONATE (INHAL) 50 MCG/BLIST IN AEPB  Inhalation Inhale 1 puff into the lungs 2 (two) times daily.      Marland Kitchen METOPROLOL TARTRATE 25 MG PO TABS Oral Take 25 mg by mouth 2 (two) times daily.      Marland Kitchen ROSUVASTATIN CALCIUM 40 MG PO TABS Oral Take by mouth daily.      Marland Kitchen DOXYCYCLINE HYCLATE 100 MG PO CAPS Oral Take 1 capsule (100 mg total) by mouth 2 (two) times daily. 20 capsule 0  . PREDNISONE 20 MG PO TABS Oral Take 1 tablet (20 mg total) by mouth daily. 5 tablet 0    BP 113/61  Pulse 74  Temp(Src) 98 F (36.7 C) (Oral)  Resp 16  SpO2 98%  Physical Exam  Constitutional: She appears well-nourished.  HENT:  Head: Normocephalic.  Right Ear: External ear normal.  Left Ear: External ear normal.  Mouth/Throat: Uvula is midline, oropharynx is clear and moist and mucous membranes are normal.  Neck: Normal range of motion.  Pulmonary/Chest: Effort normal and breath sounds normal. She has no wheezes.  Neurological: She is alert.  Skin: Skin is warm.    ED Course  Procedures (including critical care time)  Labs Reviewed - No data to display No results found.   1. Laryngitis acute   2. Cough       MDM  Uncomplicated laryngitis  Jimmie Molly, MD 09/11/11 2039  Jimmie Molly, MD 09/11/11 2040

## 2012-01-21 ENCOUNTER — Emergency Department (HOSPITAL_COMMUNITY)
Admission: EM | Admit: 2012-01-21 | Discharge: 2012-01-22 | Disposition: A | Discharge status: Home / Self Care | Payer: 59 | Attending: Emergency Medicine | Admitting: Emergency Medicine

## 2012-01-21 DIAGNOSIS — J45909 Unspecified asthma, uncomplicated: Secondary | ICD-10-CM | POA: Insufficient documentation

## 2012-01-21 DIAGNOSIS — E7800 Pure hypercholesterolemia, unspecified: Secondary | ICD-10-CM | POA: Insufficient documentation

## 2012-01-21 DIAGNOSIS — S93409A Sprain of unspecified ligament of unspecified ankle, initial encounter: Secondary | ICD-10-CM | POA: Insufficient documentation

## 2012-01-21 DIAGNOSIS — X500XXA Overexertion from strenuous movement or load, initial encounter: Secondary | ICD-10-CM | POA: Insufficient documentation

## 2012-01-21 DIAGNOSIS — S80212A Abrasion, left knee, initial encounter: Secondary | ICD-10-CM

## 2012-01-21 DIAGNOSIS — S93402A Sprain of unspecified ligament of left ankle, initial encounter: Secondary | ICD-10-CM

## 2012-01-21 DIAGNOSIS — I2510 Atherosclerotic heart disease of native coronary artery without angina pectoris: Secondary | ICD-10-CM | POA: Insufficient documentation

## 2012-01-21 DIAGNOSIS — M25519 Pain in unspecified shoulder: Secondary | ICD-10-CM | POA: Insufficient documentation

## 2012-01-21 DIAGNOSIS — Z79899 Other long term (current) drug therapy: Secondary | ICD-10-CM | POA: Insufficient documentation

## 2012-01-21 DIAGNOSIS — IMO0002 Reserved for concepts with insufficient information to code with codable children: Secondary | ICD-10-CM | POA: Insufficient documentation

## 2012-01-21 DIAGNOSIS — M25512 Pain in left shoulder: Secondary | ICD-10-CM

## 2012-01-22 ENCOUNTER — Emergency Department (HOSPITAL_COMMUNITY): Payer: 59

## 2012-01-22 ENCOUNTER — Encounter (HOSPITAL_COMMUNITY): Payer: Self-pay | Admitting: Emergency Medicine

## 2012-01-22 MED ORDER — HYDROCODONE-ACETAMINOPHEN 5-325 MG PO TABS: 1.0000 | ORAL_TABLET | ORAL | Status: AC | PRN

## 2012-01-22 MED ORDER — HYDROCODONE-ACETAMINOPHEN 5-325 MG PO TABS
1.0000 | ORAL_TABLET | Freq: Once | ORAL | Status: AC
  Administered 2012-01-22: 1 via ORAL
  Filled 2012-01-22: qty 1

## 2012-01-22 NOTE — ED Provider Notes (Signed)
Medical screening examination/treatment/procedure(s) were performed by non-physician practitioner and as supervising physician I was immediately available for consultation/collaboration.   Glynn Octave, MD 01/22/12 [phone]

## 2012-01-22 NOTE — ED Provider Notes (Signed)
History     CSN: [id number]  Arrival date & time 01/21/12  2305   First MD Initiated Contact with Patient 01/22/12 0004      Chief Complaint  Patient presents with  . Ankle Injury  . Abrasion    (Consider location/radiation/quality/duration/timing/severity/associated sxs/prior treatment) HPI Comments: Patient here with left ankle pain s/p twisting the ankle after tripping up some stairs, has abrasion to left knee as well and mild pain with movement of left shoulder - patient with swelling noted to both medial and lateral malleolus - unable to bear weight on the ankle since then - denies pain to the knee.  Patient is a 61 y.o. female presenting with lower extremity injury. The history is provided by the patient. No language interpreter was used.  Ankle Injury This is a new problem. The current episode started today. The problem occurs constantly. The problem has been unchanged. Associated symptoms include arthralgias, joint swelling and myalgias. Pertinent negatives include no abdominal pain, anorexia, change in bowel habit, chest pain, chills, congestion, coughing, diaphoresis, fatigue, fever, headaches, nausea, neck pain, numbness, rash, sore throat, swollen glands, urinary symptoms, vertigo, visual change, vomiting or weakness. The symptoms are aggravated by standing and walking. She has tried nothing for the symptoms. The treatment provided no relief.    Past Medical History  Diagnosis Date  . Asthma   . Coronary artery disease   . Hypercholesteremia     Past Surgical History  Procedure Date  . Coronary artery bypass graft   . Rotator cuff repair     History reviewed. No pertinent family history.  History  Substance Use Topics  . Smoking status: Not on file  . Smokeless tobacco: Not on file  . Alcohol Use: No    OB History    Grav Para Term Preterm Abortions TAB SAB Ect Mult Living                  Review of Systems  Constitutional: Negative for fever, chills,  diaphoresis and fatigue.  HENT: Negative for congestion, sore throat and neck pain.   Respiratory: Negative for cough.   Cardiovascular: Negative for chest pain.  Gastrointestinal: Negative for nausea, vomiting, abdominal pain, anorexia and change in bowel habit.  Musculoskeletal: Positive for myalgias, joint swelling and arthralgias.  Skin: Negative for rash.  Neurological: Negative for vertigo, weakness, numbness and headaches.  All other systems reviewed and are negative.    Allergies  Erythromycin base; Penicillins; Singulair; and Ticlid  Home Medications   Current Outpatient Rx  Name Route Sig Dispense Refill  . BECLOMETHASONE DIPROPIONATE 40 MCG/ACT IN AERS Inhalation Inhale 2 puffs into the lungs 2 (two) times daily.      Marland Kitchen CETIRIZINE HCL 10 MG PO TABS Oral Take 10 mg by mouth daily.    Marland Kitchen FLUTICASONE PROPIONATE (INHAL) 50 MCG/BLIST IN AEPB Inhalation Inhale 1 puff into the lungs 2 (two) times daily.      Marland Kitchen METOPROLOL SUCCINATE ER 25 MG PO TB24 Oral Take 25 mg by mouth daily.    Marland Kitchen ROSUVASTATIN CALCIUM 40 MG PO TABS Oral Take 40 mg by mouth daily.     Marland Kitchen PROAIR HFA IN Inhalation Inhale into the lungs.        BP 125/71  Pulse 75  Temp 98.6 F (37 C)  Resp 18  SpO2 96%  Physical Exam  Nursing note and vitals reviewed. Constitutional: She is oriented to person, place, and time. She appears well-developed and well-nourished. No distress.  HENT:  Head: Normocephalic and atraumatic.  Right Ear: External ear normal.  Left Ear: External ear normal.  Nose: Nose normal.  Mouth/Throat: Oropharynx is clear and moist. No oropharyngeal exudate.  Eyes: Conjunctivae are normal. Pupils are equal, round, and reactive to light. No scleral icterus.  Neck: Normal range of motion. Neck supple.  Cardiovascular: Normal rate, regular rhythm and normal heart sounds.  Exam reveals no gallop and no friction rub.   No murmur heard. Pulmonary/Chest: Effort normal and breath sounds normal. No  respiratory distress. She exhibits no tenderness.  Abdominal: Soft. Bowel sounds are normal. She exhibits no distension. There is no tenderness.  Musculoskeletal:       Left shoulder: She exhibits tenderness. She exhibits normal range of motion, no bony tenderness, no swelling, no effusion, no crepitus and no deformity.       Left knee: She exhibits normal range of motion, no swelling and no effusion. no tenderness found.       Left ankle: She exhibits swelling. She exhibits normal range of motion, no ecchymosis, no deformity, no laceration and normal pulse. tenderness. Lateral malleolus and medial malleolus tenderness found. Achilles tendon normal.       Legs:      Feet:  Lymphadenopathy:    She has no cervical adenopathy.  Neurological: She is alert and oriented to person, place, and time. No cranial nerve deficit.  Skin: Skin is warm and dry. No rash noted. No erythema. No pallor.  Psychiatric: She has a normal mood and affect. Her behavior is normal. Judgment and thought content normal.    ED Course  Procedures (including critical care time)  Labs Reviewed - No data to display Dg Ankle Complete Left  01/22/2012  *RADIOLOGY REPORT*  Clinical Data: Ankle injury.  Ankle pain and swelling.  LEFT ANKLE COMPLETE - 3+ VIEW  Comparison: None.  Findings: Prominent soft tissue swelling is seen along the lateral and anterior aspect of the ankle joint.  No evidence of ankle joint effusion.  No evidence of fracture or dislocation.  Mild peripheral vascular calcification is seen as well as a plantar calcaneal spur.  IMPRESSION: Anterior and lateral soft tissue swelling.  No evidence of fracture or dislocation.  Original Report Authenticated By: Danae Orleans, M.D.     Left ankle sprain Left knee abrasion    MDM  Patient without evidence of fracture or dislocation to the ankle - marked swelling noted particularly lateral portion of ankle - will place patient in CAM walker because I believe that  she will be quite unsteady on crutches.  She already has an orthopedist and will follow up with them.          Izola Price [place], [place] 01/22/12 0111

## 2012-01-22 NOTE — ED Notes (Signed)
Per pt: twisted left ankle while walking on stairs and fell with abrasion to left knee. Ankle is swollen on lateral left side. And painful, unable to bear weight.

## 2012-01-22 NOTE — Discharge Instructions (Signed)
Abrasions Abrasions are skin scrapes. Their treatment depends on how large and deep the abrasion is. Abrasions do not extend through all layers of the skin. A cut or lesion through all skin layers is called a laceration. HOME CARE INSTRUCTIONS   If you were given a dressing, change it at least once a day or as instructed by your caregiver. If the bandage sticks, soak it off with a solution of water or hydrogen peroxide.   Twice a day, wash the area with soap and water to remove all the cream/ointment. You may do this in a sink, under a tub faucet, or in a shower. Rinse off the soap and pat dry with a clean towel. Look for signs of infection (see below).   Reapply cream/ointment according to your caregiver's instruction. This will help prevent infection and keep the bandage from sticking. Telfa or gauze over the wound and under the dressing or wrap will also help keep the bandage from sticking.   If the bandage becomes wet, dirty, or develops a foul smell, change it as soon as possible.   Only take over-the-counter or prescription medicines for pain, discomfort, or fever as directed by your caregiver.  SEEK IMMEDIATE MEDICAL CARE IF:   Increasing pain in the wound.   Signs of infection develop: redness, swelling, surrounding area is tender to touch, or pus coming from the wound.   You have a fever.   Any foul smell coming from the wound or dressing.  Most skin wounds heal within ten days. Facial wounds heal faster. However, an infection may occur despite proper treatment. You should have the wound checked for signs of infection within 24 to 48 hours or sooner if problems arise. If you were not given a wound-check appointment, look closely at the wound yourself on the second day for early signs of infection listed above. MAKE SURE YOU:   Understand these instructions.   Will watch your condition.   Will get help right away if you are not doing well or get worse.  Document Released:  07/27/2005 Document Revised: 10/06/2011 Document Reviewed: 09/20/2011 [facility] Patient Information 2012 [place], [place].Ankle Sprain An ankle sprain is an injury to the strong, fibrous tissues (ligaments) that hold the bones of your ankle joint together.  CAUSES Ankle sprain usually is caused by a fall or by twisting your ankle. People who participate in sports are more prone to these types of injuries.  SYMPTOMS  Symptoms of ankle sprain include:  Pain in your ankle. The pain may be present at rest or only when you are trying to stand or walk.   Swelling.   Bruising. Bruising may develop immediately or within 1 to 2 days after your injury.   Difficulty standing or walking.  DIAGNOSIS  Your caregiver will ask you details about your injury and perform a physical exam of your ankle to determine if you have an ankle sprain. During the physical exam, your caregiver will press and squeeze specific areas of your foot and ankle. Your caregiver will try to move your ankle in certain ways. An X-ray exam may be done to be sure a bone was not broken or a ligament did not separate from one of the bones in your ankle (avulsion).  TREATMENT  Certain types of braces can help stabilize your ankle. Your caregiver can make a recommendation for this. Your caregiver may recommend the use of medication for pain. If your sprain is severe, your caregiver may refer you to a surgeon who  helps to restore function to parts of your skeletal system (orthopedist) or a physical therapist. HOME CARE INSTRUCTIONS  Apply ice to your injury for 1 to 2 days or as directed by your caregiver. Applying ice helps to reduce inflammation and pain.  Put ice in a plastic bag.   Place a towel between your skin and the bag.   Leave the ice on for 15 to 20 minutes at a time, every 2 hours while you are awake.   Take over-the-counter or prescription medicines for pain, discomfort, or fever only as directed by your caregiver.   Keep  your injured leg elevated, when possible, to lessen swelling.   If your caregiver recommends crutches, use them as instructed. Gradually, put weight on the affected ankle. Continue to use crutches or a cane until you can walk without feeling pain in your ankle.   If you have a plaster splint, wear the splint as directed by your caregiver. Do not rest it on anything harder than a pillow the first 24 hours. Do not put weight on it. Do not get it wet. You may take it off to take a shower or bath.   You may have been given an elastic bandage to wear around your ankle to provide support. If the elastic bandage is too tight (you have numbness or tingling in your foot or your foot becomes cold and blue), adjust the bandage to make it comfortable.   If you have an air splint, you may blow more air into it or let air out to make it more comfortable. You may take your splint off at night and before taking a shower or bath.   Wiggle your toes in the splint several times per day if you are able.  SEEK MEDICAL CARE IF:   You have an increase in bruising, swelling, or pain.   Your toes feel cold.   Pain relief is not achieved with medication.  SEEK IMMEDIATE MEDICAL CARE IF: Your toes are numb or blue or you have severe pain. MAKE SURE YOU:   Understand these instructions.   Will watch your condition.   Will get help right away if you are not doing well or get worse.  Document Released: 10/17/2005 Document Revised: 10/06/2011 Document Reviewed: 05/21/2008 [facility] Patient Information 2012 [place], [place].Shoulder Pain The shoulder is a ball and socket joint. Many muscles and tendons hold the joint together. Many types of injuries and medical problems can cause pain in one or more parts of the shoulder. HOME CARE  If your doctor feels the problem is not serious, it may help to do the following:  Put ice on the area.   Put ice in a plastic bag.   Place a towel between your skin and the bag.    Leave the ice on for 15 to 20 minutes, 3 to 4 times a day.   Do this for the first 2 day or as told by your doctor.   Stop using cold packs if they do not help with the pain.   Do not take your sling off (except to shower or bathe) until you see your doctor. When taking off the sling, move the arm as little as possible.   Take medicine as told by your doctor.   Keep all follow-up appointments.  GET HELP RIGHT AWAY IF:   The arm, hand, or fingers are numb or tingling.   The arm, hand, or fingers are puffy (swollen), painful, or turn white or blue.  You have trouble moving your hand and fingers on the injured side.   You have chest pain or shortness of breath.   New pain happens in the arm, hand, or fingers.   The hand or fingers on the injured side become cold.   The medicine is not helping the pain go away.  MAKE SURE YOU:   Understand these instructions.   Will watch your condition.   Will get help right away if you are not doing well or get worse.  Document Released: 04/04/2008 Document Revised: 10/06/2011 Document Reviewed: 04/04/2008 [facility] Patient Information 2012 [place], [place].

## 2012-04-18 ENCOUNTER — Ambulatory Visit: Payer: Self-pay | Admitting: Obstetrics and Gynecology

## 2012-06-05 ENCOUNTER — Other Ambulatory Visit: Payer: Self-pay | Admitting: Obstetrics and Gynecology

## 2012-06-05 DIAGNOSIS — Z1231 Encounter for screening mammogram for malignant neoplasm of breast: Secondary | ICD-10-CM

## 2012-06-18 DIAGNOSIS — D219 Benign neoplasm of connective and other soft tissue, unspecified: Secondary | ICD-10-CM | POA: Insufficient documentation

## 2012-06-18 DIAGNOSIS — Z8639 Personal history of other endocrine, nutritional and metabolic disease: Secondary | ICD-10-CM

## 2012-06-18 DIAGNOSIS — M659 Synovitis and tenosynovitis, unspecified: Secondary | ICD-10-CM | POA: Insufficient documentation

## 2012-06-18 DIAGNOSIS — Z8619 Personal history of other infectious and parasitic diseases: Secondary | ICD-10-CM

## 2012-06-18 DIAGNOSIS — D649 Anemia, unspecified: Secondary | ICD-10-CM | POA: Insufficient documentation

## 2012-06-18 DIAGNOSIS — I10 Essential (primary) hypertension: Secondary | ICD-10-CM | POA: Insufficient documentation

## 2012-06-18 DIAGNOSIS — N926 Irregular menstruation, unspecified: Secondary | ICD-10-CM | POA: Insufficient documentation

## 2012-06-18 DIAGNOSIS — Z8679 Personal history of other diseases of the circulatory system: Secondary | ICD-10-CM | POA: Insufficient documentation

## 2012-06-18 DIAGNOSIS — I252 Old myocardial infarction: Secondary | ICD-10-CM | POA: Insufficient documentation

## 2012-06-18 DIAGNOSIS — M329 Systemic lupus erythematosus, unspecified: Secondary | ICD-10-CM | POA: Insufficient documentation

## 2012-06-18 DIAGNOSIS — N762 Acute vulvitis: Secondary | ICD-10-CM | POA: Insufficient documentation

## 2012-06-18 DIAGNOSIS — K648 Other hemorrhoids: Secondary | ICD-10-CM | POA: Insufficient documentation

## 2012-06-18 DIAGNOSIS — IMO0001 Reserved for inherently not codable concepts without codable children: Secondary | ICD-10-CM | POA: Insufficient documentation

## 2012-06-18 DIAGNOSIS — I219 Acute myocardial infarction, unspecified: Secondary | ICD-10-CM

## 2012-06-18 DIAGNOSIS — I25810 Atherosclerosis of coronary artery bypass graft(s) without angina pectoris: Secondary | ICD-10-CM | POA: Insufficient documentation

## 2012-06-18 DIAGNOSIS — M94269 Chondromalacia, unspecified knee: Secondary | ICD-10-CM | POA: Insufficient documentation

## 2012-06-18 DIAGNOSIS — E669 Obesity, unspecified: Secondary | ICD-10-CM | POA: Insufficient documentation

## 2012-06-18 DIAGNOSIS — E7800 Pure hypercholesterolemia, unspecified: Secondary | ICD-10-CM | POA: Insufficient documentation

## 2012-06-21 ENCOUNTER — Ambulatory Visit (INDEPENDENT_AMBULATORY_CARE_PROVIDER_SITE_OTHER): Payer: 59 | Admitting: Obstetrics and Gynecology

## 2012-06-21 ENCOUNTER — Encounter: Payer: Self-pay | Admitting: Obstetrics and Gynecology

## 2012-06-21 VITALS — BP 128/70 | Ht 63.25 in | Wt 247.5 lb

## 2012-06-21 DIAGNOSIS — Z01419 Encounter for gynecological examination (general) (routine) without abnormal findings: Secondary | ICD-10-CM

## 2012-06-21 DIAGNOSIS — D259 Leiomyoma of uterus, unspecified: Secondary | ICD-10-CM

## 2012-06-21 DIAGNOSIS — D219 Benign neoplasm of connective and other soft tissue, unspecified: Secondary | ICD-10-CM

## 2012-06-21 NOTE — Progress Notes (Signed)
Subjective:    Deanna Simmons is a 61 y.o. female G3P3 who presents for annual exam.    Pt with occ night sweats and hot flashes. Pt uses OTC regimen when sx's worsen with relief.   Last Pap: 02/16/2010 WNL: Yes Regular Periods:no Contraception: none  Monthly Breast exam:yes Tetanus<7yrs:yes Nl.Bladder Function:yes Daily BMs:yes Healthy Diet:yes Calcium:yes Mammogram:yes Date of Mammogram: 08/22/2012 Exercise:yes Have often Exercise: daily Seatbelt: yes Abuse at home: no Stressful work:no Sigmoid-colonoscopy: approx 30yrs ago-wnl per pt. Due 2014 per Dr. Wilmon Pali Density: Yes 03/02/2010-wnl PCP: Dr. Kevan Ny Change in PMH: no change Change in FMH:no change BMI=44   The following portions of the patient's history were reviewed and updated as appropriate: allergies, current medications, past family history, past medical history, past social history, past surgical history and problem list.  Review of Systems Pertinent items are noted in HPI. Gastrointestinal:No change in bowel habits, no abdominal pain, no rectal bleeding Genitourinary:negative for dysuria, frequency, hematuria, nocturia and urinary incontinence    Objective:     BP 128/70  Ht 5' 3.25" (1.607 m)  Wt 247 lb 8 oz (112.265 kg)  BMI 43.50 kg/m2  Weight:  Wt Readings from Last 1 Encounters:  06/21/12 247 lb 8 oz (112.265 kg)     BMI: Body mass index is 43.50 kg/(m^2). General Appearance: Alert, appropriate appearance for age. No acute distress HEENT: Grossly normal Neck / Thyroid: Supple, no masses, nodes or enlargement Lungs: clear to auscultation bilaterally Back: No CVA tenderness Breast Exam: Exam is limited by body habituswith large pendolous breasts, but No masses or nodes.No dimpling, nipple retraction or discharge. Cardiovascular: Regular rate and rhythm. S1, S2, no murmur Gastrointestinal: Soft, non-tender, no masses or organomegaly Pelvic Exam: External genitalia: normal general  appearance Vaginal: atrophic mucosa Cervix: normal appearance Adnexa: non palpable Uterus: Exam is limited by body habitus but no enlargement appreciated Rectovaginal: normal rectal, no masses Lymphatic Exam: Non-palpable nodes in neck, clavicular, axillary, or inguinal regions Skin: no rash or abnormalities Neurologic: Normal gait and speech, no tremor  Psychiatric: Alert and oriented, appropriate affect.    Urinalysis:Not done    Assessment:  Asymptomatic with hx of fibroids  Normal gyn exam Menopause    Plan:   mammogram pap smear due 2014 return annually or prn

## 2012-08-23 ENCOUNTER — Ambulatory Visit
Admission: RE | Admit: 2012-08-23 | Discharge: 2012-08-23 | Disposition: A | Discharge status: Home / Self Care | Payer: 59 | Source: Ambulatory Visit | Attending: Obstetrics and Gynecology | Admitting: Obstetrics and Gynecology

## 2012-08-23 DIAGNOSIS — Z1231 Encounter for screening mammogram for malignant neoplasm of breast: Secondary | ICD-10-CM

## 2012-10-27 ENCOUNTER — Ambulatory Visit (INDEPENDENT_AMBULATORY_CARE_PROVIDER_SITE_OTHER): Payer: 59 | Admitting: Family Medicine

## 2012-10-27 VITALS — BP 148/88 | HR 80 | Temp 98.8°F | Resp 18 | Ht 63.5 in | Wt 248.0 lb

## 2012-10-27 DIAGNOSIS — R0789 Other chest pain: Secondary | ICD-10-CM

## 2012-10-27 DIAGNOSIS — R071 Chest pain on breathing: Secondary | ICD-10-CM

## 2012-10-27 MED ORDER — NITROGLYCERIN 0.4 MG SL SUBL: 0.4000 mg | SUBLINGUAL_TABLET | SUBLINGUAL | Status: DC | PRN

## 2012-10-27 MED ORDER — METAXALONE 800 MG PO TABS: 800.0000 mg | ORAL_TABLET | Freq: Three times a day (TID) | ORAL | Status: DC

## 2012-10-27 NOTE — Progress Notes (Signed)
Subjective:    Patient ID: Deanna Simmons, female    DOB: 08-Sep-1951, 61 y.o.   MRN: [id number] Chief Complaint  Patient presents with  . Cough    has had cold/ cough for a while now with some left sided pain in chest    HPI  Pt is having pain under her left breast, it began a few days prev. She was coming down w/ a cold - told to use saline nasal spray and she got better but then pain begins. Left-sided chest pain travels underneath axilla to back.  It goes away if she takes ibuprofen but will come back after it wears off for few hours.  Is worse when she lays on that left side. Is not tender to palpation, feels better w/ stretching it.  Has not doen any exertion since she develops pain but ambulation does not make it worse. No SHoB, no diaphoresis, no n/v.    Has had a MI w/ 1 vessel cabg around 2002 - last seen by cardiology Dr. Verdis Prime about 6 mos ago. Last stress test was about 1 1/2 yrs ago.  No CP until now.   No indigestion or heartburn.   No cough Did not take BP medication today - just didn't but has it with her and will take now.  Pain is unchanged w/ deep breathing, feels a little better with palpation, unchanged with arm movement.    CP is not typical of her prior anginal pain.  Is just dull, constant pain.  Only thing that makes it worse is trying to sit up, only thing that makes it better is palpation or ibuprofen.  Past Medical History  Diagnosis Date  . Asthma   . Coronary artery disease   . Hypercholesteremia   . Obesity   . Systemic lupus erythematosus     with nephritis  . Anemia   . Chondromalacia of knee   . Synovitis of knee   . Hypertension   . Pneumonia 07/2008  . Fibroids     h/o  . Internal hemorrhoids     h/o  . Diverticulum 12/11/2002    in cecum  . H/O tinea cruris 02/2010  . History of ASCVD     s/p CABG  . H/O elevated lipids   . Vulvitis     h/o  . Abnormal finding on Pap smear, ASCUS   . Irregular menses     h/o  . MI (myocardial  infarction) age 68   Current Outpatient Prescriptions on File Prior to Visit  Medication Sig Dispense Refill  . Albuterol Sulfate (PROAIR HFA IN) Inhale 1-2 puffs into the lungs every 6 (six) hours as needed. wheezing      . aspirin 81 MG EC tablet Take 81 mg by mouth daily. Swallow whole.      . beclomethasone (QVAR) 40 MCG/ACT inhaler Inhale 2 puffs into the lungs 2 (two) times daily.        Marland Kitchen Bioflavonoid Products (VITAMIN C) CHEW Chew 1 tablet by mouth daily.      . cetirizine (ZYRTEC) 10 MG tablet Take 10 mg by mouth daily.      . Cholecalciferol (VITAMIN D3) 2000 UNITS TABS Take 1 tablet by mouth daily.      . fish oil-omega-3 fatty acids 1000 MG capsule Take 2 g by mouth daily.      . metoprolol succinate (TOPROL-XL) 25 MG 24 hr tablet Take 25 mg by mouth daily.      Marland Kitchen  Multiple Vitamins-Minerals (MULTIVITAL PO) Take 1 tablet by mouth daily.      . rosuvastatin (CRESTOR) 40 MG tablet Take 40 mg by mouth daily.       . [DISCONTINUED] metoprolol tartrate (LOPRESSOR) 25 MG tablet Take 25 mg by mouth daily.        No current facility-administered medications on file prior to visit.   Allergies  Allergen Reactions  . Biaxin (Clarithromycin) Itching  . Cardiolite (Technetium-27m) Itching  . Ciprofloxacin Itching  . Erythromycin Base Itching  . Montelukast Sodium Itching  . Niaspan (Niacin Er) Other (See Comments)    flushing  . Penicillins Itching  . Prilosec (Omeprazole) Itching  . Ticlid (Ticlopidine Hcl) Itching    Review of Systems  Constitutional: Negative for diaphoresis.  Respiratory: Negative for cough, chest tightness, shortness of breath and wheezing.   Cardiovascular: Positive for chest pain.  Gastrointestinal: Negative for nausea, vomiting and abdominal pain.  Musculoskeletal: Positive for myalgias, back pain and arthralgias. Negative for joint swelling and gait problem.  Skin: Negative for rash.  Neurological: Negative for weakness and numbness.  Hematological:  Negative for adenopathy.  Psychiatric/Behavioral: Positive for sleep disturbance.      BP 148/88  Pulse 80  Temp(Src) 98.8 F (37.1 C)  Resp 18  Ht 5' 3.5" (1.613 m)  Wt 248 lb (112.492 kg)  BMI 43.24 kg/m2  SpO2 99% Objective:   Physical Exam  Constitutional: She is oriented to person, place, and time. She appears well-developed and well-nourished. No distress.  HENT:  Head: Normocephalic and atraumatic.  Right Ear: Tympanic membrane, external ear and ear canal normal.  Left Ear: Tympanic membrane, external ear and ear canal normal.  Nose: Nose normal. No mucosal edema or rhinorrhea.  Mouth/Throat: Uvula is midline, oropharynx is clear and moist and mucous membranes are normal. No oropharyngeal exudate.  Eyes: Conjunctivae are normal. Right eye exhibits no discharge. Left eye exhibits no discharge. No scleral icterus.  Neck: Normal range of motion. Neck supple.  Cardiovascular: Normal rate, regular rhythm and intact distal pulses.   Murmur heard.  Decrescendo systolic murmur is present with a grade of 3/6  Pulmonary/Chest: Effort normal and breath sounds normal. She exhibits tenderness and bony tenderness.  Abdominal: Soft. Bowel sounds are normal. She exhibits no distension and no mass. There is no tenderness. There is no rebound and no guarding.  Lymphadenopathy:    She has no cervical adenopathy.  Neurological: She is alert and oriented to person, place, and time.  Skin: Skin is warm and dry. She is not diaphoretic. No erythema.  Psychiatric: She has a normal mood and affect. Her behavior is normal.      [facility] reading (PRIMARY) by  Dr. Clelia Croft. EKG:  NSR, no ischemic changes.    Assessment & Plan:  Left-sided chest wall pain - Plan: EKG 12-Lead - no nsaids due to h/o CAD but try muscle relaxant and ice.  To ER for cardiac w/u if worsens.  Meds ordered this encounter  Medications  . nitroGLYCERIN (NITROSTAT) 0.4 MG SL tablet    Sig: Place 1 tablet (0.4 mg total) under  the tongue every 5 (five) minutes as needed for chest pain.    Dispense:  50 tablet    Refill:  3  . metaxalone (SKELAXIN) 800 MG tablet    Sig: Take 1 tablet (800 mg total) by mouth 3 (three) times daily.    Dispense:  30 tablet    Refill:  0

## 2012-10-28 ENCOUNTER — Emergency Department (HOSPITAL_COMMUNITY): Payer: 59

## 2012-10-28 ENCOUNTER — Encounter (HOSPITAL_COMMUNITY): Payer: Self-pay | Admitting: *Deleted

## 2012-10-28 ENCOUNTER — Emergency Department (HOSPITAL_COMMUNITY)
Admission: EM | Admit: 2012-10-28 | Discharge: 2012-10-28 | Disposition: A | Discharge status: Home / Self Care | Payer: 59 | Attending: Emergency Medicine | Admitting: Emergency Medicine

## 2012-10-28 DIAGNOSIS — Z7982 Long term (current) use of aspirin: Secondary | ICD-10-CM | POA: Insufficient documentation

## 2012-10-28 DIAGNOSIS — E669 Obesity, unspecified: Secondary | ICD-10-CM | POA: Insufficient documentation

## 2012-10-28 DIAGNOSIS — Z79899 Other long term (current) drug therapy: Secondary | ICD-10-CM | POA: Insufficient documentation

## 2012-10-28 DIAGNOSIS — Z8742 Personal history of other diseases of the female genital tract: Secondary | ICD-10-CM | POA: Insufficient documentation

## 2012-10-28 DIAGNOSIS — Z862 Personal history of diseases of the blood and blood-forming organs and certain disorders involving the immune mechanism: Secondary | ICD-10-CM | POA: Insufficient documentation

## 2012-10-28 DIAGNOSIS — I2510 Atherosclerotic heart disease of native coronary artery without angina pectoris: Secondary | ICD-10-CM | POA: Insufficient documentation

## 2012-10-28 DIAGNOSIS — J45909 Unspecified asthma, uncomplicated: Secondary | ICD-10-CM | POA: Insufficient documentation

## 2012-10-28 DIAGNOSIS — Z8701 Personal history of pneumonia (recurrent): Secondary | ICD-10-CM | POA: Insufficient documentation

## 2012-10-28 DIAGNOSIS — R079 Chest pain, unspecified: Secondary | ICD-10-CM | POA: Insufficient documentation

## 2012-10-28 DIAGNOSIS — Z951 Presence of aortocoronary bypass graft: Secondary | ICD-10-CM | POA: Insufficient documentation

## 2012-10-28 DIAGNOSIS — Z8719 Personal history of other diseases of the digestive system: Secondary | ICD-10-CM | POA: Insufficient documentation

## 2012-10-28 DIAGNOSIS — Z872 Personal history of diseases of the skin and subcutaneous tissue: Secondary | ICD-10-CM | POA: Insufficient documentation

## 2012-10-28 DIAGNOSIS — I252 Old myocardial infarction: Secondary | ICD-10-CM | POA: Insufficient documentation

## 2012-10-28 DIAGNOSIS — E7800 Pure hypercholesterolemia, unspecified: Secondary | ICD-10-CM | POA: Insufficient documentation

## 2012-10-28 DIAGNOSIS — I10 Essential (primary) hypertension: Secondary | ICD-10-CM | POA: Insufficient documentation

## 2012-10-28 DIAGNOSIS — Z8739 Personal history of other diseases of the musculoskeletal system and connective tissue: Secondary | ICD-10-CM | POA: Insufficient documentation

## 2012-10-28 LAB — CBC WITH DIFFERENTIAL/PLATELET
Basophils Absolute: 0 10*3/uL (ref 0.0–0.1)
Basophils Relative: 0 % (ref 0–1)
Eosinophils Absolute: 0.2 10*3/uL (ref 0.0–0.7)
Eosinophils Relative: 3 % (ref 0–5)
MCH: 27.7 pg (ref 26.0–34.0)
MCHC: 31.7 g/dL (ref 30.0–36.0)
MCV: 87.4 fL (ref 78.0–100.0)
Neutrophils Relative %: 47 % (ref 43–77)
Platelets: 267 10*3/uL (ref 150–400)
RBC: 3.82 MIL/uL — ABNORMAL LOW (ref 3.87–5.11)
RDW: 12.5 % (ref 11.5–15.5)

## 2012-10-28 LAB — TROPONIN I: Troponin I: <0.3 ng/mL (ref <0.30)

## 2012-10-28 LAB — BASIC METABOLIC PANEL
BUN: 8 mg/dL (ref 6–23)
Calcium: 9.3 mg/dL (ref 8.4–10.5)
Creatinine, Ser: 0.56 mg/dL (ref 0.50–1.10)
GFR calc non Af Amer: >90 mL/min (ref >90)
Glucose, Bld: 136 mg/dL — ABNORMAL HIGH (ref 70–99)
Sodium: 133 mEq/L — ABNORMAL LOW (ref 135–145)

## 2012-10-28 MED ORDER — HYDROCODONE-ACETAMINOPHEN 5-325 MG PO TABS
2.0000 | ORAL_TABLET | Freq: Once | ORAL | Status: AC
  Administered 2012-10-28: 2 via ORAL
  Filled 2012-10-28: qty 2

## 2012-10-28 MED ORDER — HYDROCODONE-ACETAMINOPHEN 5-325 MG PO TABS: 1.0000 | ORAL_TABLET | Freq: Three times a day (TID) | ORAL | Status: DC | PRN

## 2012-10-28 NOTE — ED Notes (Signed)
Wickline MD at bedside  

## 2012-10-28 NOTE — ED Notes (Signed)
PT states understanding of discharge instructions 

## 2012-10-28 NOTE — ED Provider Notes (Signed)
History     CSN: [id number]  Arrival date & time 10/28/12  0229   First MD Initiated Contact with Patient 10/28/12 0401      Chief Complaint  Patient presents with  . Chest Pain     Patient is a 61 y.o. female presenting with chest pain. The history is provided by the patient.  Chest Pain The chest pain began 2 days ago. Chest pain occurs constantly. The chest pain is unchanged. The severity of the pain is moderate. The quality of the pain is described as aching. Radiates to: left axilla. Chest pain is worsened by certain positions. Pertinent negatives for primary symptoms include no fever, no fatigue, no syncope, no shortness of breath, no cough, no abdominal pain, no vomiting and no dizziness.  Pertinent negatives for associated symptoms include no diaphoresis and no weakness. She tried nitroglycerin and NSAIDs for the symptoms.   pt reports constant left sided CP for over 2 days No trauma/fall No sob/weakness/nausea/vomiting No focal weakness She went to bed with pain and she woke up with pain and wanted to be evaluated Seen at urgent care for similar complaint and given skelaxin with minimal improvement   She took NTG but no improvement in symptoms She took ibuprofen with some improvement She reports this is not similar to prior anginal symptoms She has otherwise been well though did report some symptoms of cold recently  Past Medical History  Diagnosis Date  . Asthma   . Coronary artery disease   . Hypercholesteremia   . Obesity   . Systemic lupus erythematosus     with nephritis  . Anemia   . Chondromalacia of knee   . Synovitis of knee   . Hypertension   . Pneumonia 07/2008  . Fibroids     h/o  . Internal hemorrhoids     h/o  . Diverticulum 12/11/2002    in cecum  . H/O tinea cruris 02/2010  . History of ASCVD     s/p CABG  . H/O elevated lipids   . Vulvitis     h/o  . Abnormal finding on Pap smear, ASCUS   . Irregular menses     h/o  . MI (myocardial  infarction) age 74    Past Surgical History  Procedure Date  . Coronary artery bypass graft   . Rotator cuff repair   . Cesarean section   . Tubal ligation   . Upper gastrointestinal endoscopy 12/11/2002  . Endometrial curettage 03/29/2000    Family History  Problem Relation Age of Onset  . Breast cancer Mother   . Prostate cancer Father   . Diabetes Mother   . Diabetes Father   . Stroke Mother   . Heart attack Paternal Uncle     2 paternal uncles  . Ovarian cancer Cousin     History  Substance Use Topics  . Smoking status: Never Smoker   . Smokeless tobacco: Never Used  . Alcohol Use: No    OB History    Grav Para Term Preterm Abortions TAB SAB Ect Mult Living   3 3       1 4       Review of Systems  Constitutional: Negative for fever, diaphoresis and fatigue.  Respiratory: Negative for cough and shortness of breath.   Cardiovascular: Positive for chest pain. Negative for syncope.  Gastrointestinal: Negative for vomiting and abdominal pain.  Neurological: Negative for dizziness and weakness.  All other systems reviewed and are negative.  Allergies  Biaxin; Cardiolite; Ciprofloxacin; Erythromycin base; Montelukast sodium; Niaspan; Penicillins; Prilosec; and Ticlid  Home Medications   Current Outpatient Rx  Name  Route  Sig  Dispense  Refill  . PROAIR HFA IN   Inhalation   Inhale 1-2 puffs into the lungs every 6 (six) hours as needed. wheezing         . ASPIRIN 81 MG PO TBEC   Oral   Take 81 mg by mouth daily. Swallow whole.         . BECLOMETHASONE DIPROPIONATE 40 MCG/ACT IN AERS   Inhalation   Inhale 2 puffs into the lungs 2 (two) times daily.           Marland Kitchen CETIRIZINE HCL 10 MG PO TABS   Oral   Take 10 mg by mouth daily.         Marland Kitchen VITAMIN D3 2000 UNITS PO TABS   Oral   Take 1 tablet by mouth daily.         Marland Kitchen ESOMEPRAZOLE MAGNESIUM 20 MG PO PACK   Oral   Take 20 mg by mouth daily before breakfast.         . OMEGA-3 FATTY ACIDS  1000 MG PO CAPS   Oral   Take 2 g by mouth daily.         Marland Kitchen FLUTICASONE PROPIONATE 50 MCG/ACT NA SUSP   Nasal   Place 2 sprays into the nose daily as needed. Nasal congestion         . METAXALONE 800 MG PO TABS   Oral   Take 1 tablet (800 mg total) by mouth 3 (three) times daily.   30 tablet   0   . METOPROLOL SUCCINATE ER 25 MG PO TB24   Oral   Take 25 mg by mouth daily.         . MULTIVITAL PO   Oral   Take 1 tablet by mouth daily.         Marland Kitchen NITROGLYCERIN 0.4 MG SL SUBL   Sublingual   Place 1 tablet (0.4 mg total) under the tongue every 5 (five) minutes as needed for chest pain.   50 tablet   3   . ROSUVASTATIN CALCIUM 40 MG PO TABS   Oral   Take 40 mg by mouth daily.          Marland Kitchen VITAMIN C PO CHEW   Oral   Chew 1 tablet by mouth daily.           BP 174/73  Pulse 94  Temp 98.5 F (36.9 C) (Oral)  Resp 18  SpO2 94%  Physical Exam CONSTITUTIONAL: Well developed/well nourished HEAD AND FACE: Normocephalic/atraumatic EYES: EOMI/PERRL ENMT: Mucous membranes moist NECK: supple no meningeal signs SPINE:entire spine nontender CV: S1/S2 noted,murmur noted (pt reports this is chronic) No rub or gallop noted LUNGS: Lungs are clear to auscultation bilaterally, no apparent distress Chest - tenderness reproduced with movement in bed and lying on that side ABDOMEN: soft, nontender, no rebound or guarding GU:no cva tenderness NEURO: Pt is awake/alert, moves all extremitiesx4, no focal motor deficit EXTREMITIES: pulses normal, full ROM, no edema or calf tenderness noted SKIN: warm, color normal PSYCH: no abnormalities of mood noted  ED Course  Procedures Labs Reviewed  BASIC METABOLIC PANEL - Abnormal; Notable for the following:    Sodium 133 (*)     Glucose, Bld 136 (*)     All other components within normal limits  CBC WITH DIFFERENTIAL -  Abnormal; Notable for the following:    RBC 3.82 (*)     Hemoglobin 10.6 (*)     HCT 33.4 (*)     All other  components within normal limits  TROPONIN I  TROPONIN I   Dg Chest 2 View  10/28/2012  *RADIOLOGY REPORT*  Clinical Data: Chest pain.  CHEST - 2 VIEW  Comparison: Chest radiograph performed 02/27/2011  Findings: The lungs are well-aerated and clear.  There is no evidence of focal opacification, pleural effusion or pneumothorax.  The heart is borderline normal in size; the patient is status post median sternotomy, with evidence of prior CABG.  No acute osseous abnormalities are seen.  IMPRESSION: No acute cardiopulmonary process seen.   Original Report Authenticated By: Tonia Ghent, M.D.       5:36 AM Pt well appearing, no distress.  Has had persistent CP for two days.  No other symptoms.  Seen at local urgent care (records reviewed) and similar story.  Her history is very atypical for ACS.  I doubt PE/Dissection at this time 6:07 AM Pt improved.  Reports most of her pain is worse lying on left side.  Not worse with lying flat or sitting forward No exertional pain.  No pleuritic pain Two troponins negative and no sig. Change in her EKG.  Though this does not rule out ACS completely, it is reassuring given her story is atypical.  Doubt pericarditis/myocarditis MDM  Nursing notes including past medical history and social history reviewed and considered in documentation Labs/vital reviewed and considered xrays reviewed and considered Previous records reviewed and considered - h/o CABG        Date: 10/28/2012 0241am  Rate: 86  Rhythm: normal sinus rhythm  QRS Axis: normal  Intervals: normal  ST/T Wave abnormalities: nonspecific ST changes  Conduction Disutrbances:none    Date: 10/28/2012 0435  Rate: 61  Rhythm: normal sinus rhythm  QRS Axis: normal  Intervals: normal  ST/T Wave abnormalities: nonspecific ST changes  Conduction Disutrbances:none  Narrative Interpretation:   Old EKG Reviewed: unchanged from previous EKG earlier tonight      Joya Gaskins,  MD 10/28/12 [phone]

## 2012-10-28 NOTE — ED Notes (Addendum)
C/o CP, seen by PCP in office (see H&P & EKG), denies change or difference from exam, "just not better and woke me up", intermittant, now more constant, worse with movement, some relief with ibuprofen, no change with ntg, h/o MI & CABG. "different from anginal pain in the past". Alert, NAD, calm, interactive, skin W&D, resps e/u, speaking in clear complete sentences. LS CTA. No labs drawn this am at PCP office or CXR. Acuity made according to card hx.

## 2012-11-20 ENCOUNTER — Other Ambulatory Visit: Payer: Self-pay | Admitting: Obstetrics and Gynecology

## 2012-12-11 ENCOUNTER — Telehealth: Payer: Self-pay | Admitting: Obstetrics and Gynecology

## 2012-12-15 ENCOUNTER — Other Ambulatory Visit: Payer: Self-pay

## 2013-01-09 ENCOUNTER — Encounter: Payer: Self-pay | Admitting: *Deleted

## 2013-01-09 ENCOUNTER — Encounter: Payer: 59 | Attending: Internal Medicine | Admitting: *Deleted

## 2013-01-09 DIAGNOSIS — Z713 Dietary counseling and surveillance: Secondary | ICD-10-CM | POA: Insufficient documentation

## 2013-01-09 DIAGNOSIS — E669 Obesity, unspecified: Secondary | ICD-10-CM | POA: Insufficient documentation

## 2013-01-09 NOTE — Progress Notes (Signed)
Medical Nutrition Therapy:  Appt start time: 1645 end time:  1745.  Assessment:  Patient is a Runner, broadcasting/film/video at [employer]. She is here today for weight loss counseling. She reports that she has started exercising and being more mindful about healthy food choices and portion size. She reports snacking on sweets in the evening.   WEIGHT: 250.9 pounds BMI: 41.8   MEDICATIONS: Reviewed in chart   DIETARY INTAKE:   Usual eating pattern includes 3 meals and 3 or more snacks per day.  24-hr recall:  B ( AM): cereal (honey nut cheerios, special K fruit and yogurt) with 1% milk, or oatmeal with water, coffee/tea, boiled egg, slice of bacon Snk ( AM): Honey nut cheerios dry, chex mix, almonds  L ( PM): In cafeteria, salad or chicken salad sandwich with pretzels, water, yogurt Snk ( PM): Popcorn, crackers D ( PM): Grilled cheese, cereal, meat with vegetables, pasta/beans/rice, slice of bread, water Snk ( PM): cereal Beverages: Green tea, water, unsweetened tea  Usual physical activity: Elliptical at home 4 days weekly 30 min   Estimated energy needs: 1700 calories 213 g carbohydrates 106 g protein 47 g fat  Progress Towards Goal(s):  In progress.   Nutritional Diagnosis:  [place]-3.3 Overweight/obesity As related to history of excessive energy intake and physical inactivity.  As evidenced by BMI 41.8.    Intervention:  Nutrition counseling. Discussed strategies for weight loss including eating regular meals and snacks, label reading, portion control, and exercise.   Goals:  1. 1700 calories daily for 1 pound weight loss per week.  2. Read labels for serving size and calories.  3. Monitor portion size.  4. Continue to exercise 4 days weekly for at least 30 minutes.   Handouts given during visit include:  Weight loss tips  5 day sample meal plan  Yellow portion card  Monitoring/Evaluation:  Dietary intake, exercise, and body weight in 1 month(s).

## 2013-02-20 ENCOUNTER — Encounter: Payer: 59 | Attending: Internal Medicine | Admitting: *Deleted

## 2013-02-20 ENCOUNTER — Encounter: Payer: Self-pay | Admitting: *Deleted

## 2013-02-20 VITALS — Ht 65.0 in | Wt 252.9 lb

## 2013-02-20 DIAGNOSIS — Z713 Dietary counseling and surveillance: Secondary | ICD-10-CM | POA: Insufficient documentation

## 2013-02-20 DIAGNOSIS — E669 Obesity, unspecified: Secondary | ICD-10-CM | POA: Insufficient documentation

## 2013-02-20 NOTE — Progress Notes (Addendum)
Medical Nutrition Therapy:  Appt start time: 1700 end time:  1730.  Assessment:  Patient here for a follow up visit for weight loss. Her weight is up 2.9 pounds (from 150 pounds 1 month ago). She reports that she has not been exercising due to her granddaughter staying with her. However, she reports that she can exercise (elliptical, bike, walking) at work during breaks. She has been limiting fried food, but snacking on sweets and portion control are still issues.   MEDICATIONS: Reviewed   DIETARY INTAKE:   Usual eating pattern includes 3 meals and 3 snacks per day.  24-hr recall:  B ( AM): Egg whites, [place] sausage, oatmeal, coffee, water  Snk ( AM): Honey nut cheerios dry, chex mix, almonds  L ( PM): In cafeteria, salad or chicken salad sandwich with pretzels, water, yogurt  Snk ( PM): Popcorn, crackers  D ( PM): Grilled cheese, cereal, meat with vegetables, pasta/beans/rice, slice of bread, water  Snk ( PM): cereal  Beverages: Green tea, water, unsweetened tea  Usual physical activity: None currently, was using elliptical at home 4 days weekly for 30 minutes  Estimated energy needs: 1700 calories  213 g carbohydrates  106 g protein  47 g fat  Progress Towards Goal(s):  In progress.   Nutritional Diagnosis:  [place]-3.3 Overweight/obesity As related to history of excessive energy intake and physical inactivity. As evidenced by BMI 41.8.    Intervention:  Nutrition counseling. I reinforced basic concepts of weight loss including, healthy snacking, portion control, and exercise.   Goals:  1. 1500-1700 calories daily for >1 pound weight loss per week.  2.  Monitor portion size. Limit meat portions to 3 ounces, 1-2 servings of starch at meals, and fill half of plate with vegetables. Use MyFitnessPal to track diet.  3. Eat moderate portions of sweets if desired. Choose healthy snacks (fruit, yogurt, popcorn, nuts) more frequently 4. Work back up to exercising 4 days weekly for at least  30 minutes.   Handouts given during visit include:  1200 calorie 5 day meal plan  Monitoring/Evaluation:  Dietary intake, exercise, and body weight in 2 month(s).

## 2013-02-20 NOTE — Patient Instructions (Signed)
Goals:  1. 1500-1700 calories daily for >1 pound weight loss per week.  2.  Monitor portion size. Limit meat portions to 3 ounces, 1-2 servings of starch at meals, and fill half of plate with vegetables.  3. Eat moderate portions of sweets if desired. Choose healthy snacks (fruit, yogurt, popcorn, nuts) more frequently 4. Work back up to exercising 4 days weekly for at least 30 minutes.

## 2013-03-22 ENCOUNTER — Ambulatory Visit (INDEPENDENT_AMBULATORY_CARE_PROVIDER_SITE_OTHER): Payer: Self-pay | Admitting: Family Medicine

## 2013-03-22 DIAGNOSIS — E119 Type 2 diabetes mellitus without complications: Secondary | ICD-10-CM

## 2013-03-22 NOTE — Progress Notes (Signed)
Patient presents for 3 month follow up DM as part of the employee sponsored Link to Wellness Program. Medications have been reviewed. I have also discussed with patient lifestyle interventions such as diet and exercise. Full documentation of this visit can be found in the Caretracker documenting system through [facility] ([facility]). Patient has set a series of personal goals and will follow up in 3 months for further review of DM.  

## 2013-03-28 NOTE — Progress Notes (Signed)
Patient ID: Deanna Simmons, female   DOB: 09/16/1951, 62 y.o.   MRN: [id number] ATTENDING PHYSICIAN NOTE: I have reviewed the chart and agree with the plan as detailed above. Denny Levy MD Pager [phone]

## 2013-04-24 ENCOUNTER — Ambulatory Visit: Payer: 59 | Admitting: *Deleted

## 2013-06-11 ENCOUNTER — Other Ambulatory Visit (HOSPITAL_COMMUNITY): Payer: Self-pay | Admitting: Interventional Cardiology

## 2013-06-11 DIAGNOSIS — I2510 Atherosclerotic heart disease of native coronary artery without angina pectoris: Secondary | ICD-10-CM

## 2013-06-11 DIAGNOSIS — I350 Nonrheumatic aortic (valve) stenosis: Secondary | ICD-10-CM

## 2013-06-17 ENCOUNTER — Encounter (HOSPITAL_COMMUNITY)
Admission: RE | Admit: 2013-06-17 | Discharge: 2013-06-17 | Disposition: A | Discharge status: Home / Self Care | Payer: 59 | Source: Ambulatory Visit | Attending: Interventional Cardiology | Admitting: Interventional Cardiology

## 2013-06-17 VITALS — BP 142/62

## 2013-06-17 DIAGNOSIS — I2510 Atherosclerotic heart disease of native coronary artery without angina pectoris: Secondary | ICD-10-CM

## 2013-06-17 DIAGNOSIS — I219 Acute myocardial infarction, unspecified: Secondary | ICD-10-CM

## 2013-06-17 MED ORDER — REGADENOSON 0.4 MG/5ML IV SOLN
INTRAVENOUS | Status: AC
  Administered 2013-06-17: 0.4 mg via INTRAVENOUS
  Filled 2013-06-17: qty 5

## 2013-06-17 MED ORDER — REGADENOSON 0.4 MG/5ML IV SOLN
0.4000 mg | Freq: Once | INTRAVENOUS | Status: AC
  Administered 2013-06-17: 0.4 mg via INTRAVENOUS

## 2013-06-18 ENCOUNTER — Encounter (HOSPITAL_COMMUNITY)
Admission: RE | Admit: 2013-06-18 | Discharge: 2013-06-18 | Disposition: A | Discharge status: Home / Self Care | Payer: 59 | Source: Ambulatory Visit | Attending: Interventional Cardiology | Admitting: Interventional Cardiology

## 2013-06-18 ENCOUNTER — Ambulatory Visit (HOSPITAL_COMMUNITY)
Admission: RE | Admit: 2013-06-18 | Discharge: 2013-06-18 | Disposition: A | Discharge status: Home / Self Care | Payer: 59 | Source: Ambulatory Visit | Attending: Interventional Cardiology | Admitting: Interventional Cardiology

## 2013-06-18 DIAGNOSIS — I080 Rheumatic disorders of both mitral and aortic valves: Secondary | ICD-10-CM | POA: Insufficient documentation

## 2013-06-18 DIAGNOSIS — E119 Type 2 diabetes mellitus without complications: Secondary | ICD-10-CM | POA: Insufficient documentation

## 2013-06-18 DIAGNOSIS — I2510 Atherosclerotic heart disease of native coronary artery without angina pectoris: Secondary | ICD-10-CM | POA: Insufficient documentation

## 2013-06-18 DIAGNOSIS — I350 Nonrheumatic aortic (valve) stenosis: Secondary | ICD-10-CM

## 2013-06-18 DIAGNOSIS — I10 Essential (primary) hypertension: Secondary | ICD-10-CM | POA: Insufficient documentation

## 2013-06-18 DIAGNOSIS — I252 Old myocardial infarction: Secondary | ICD-10-CM | POA: Insufficient documentation

## 2013-06-18 DIAGNOSIS — E785 Hyperlipidemia, unspecified: Secondary | ICD-10-CM | POA: Insufficient documentation

## 2013-06-18 MED ORDER — TECHNETIUM TC 99M SESTAMIBI GENERIC - CARDIOLITE
30.0000 | Freq: Once | INTRAVENOUS | Status: AC | PRN
  Administered 2013-06-18: 30 via INTRAVENOUS

## 2013-06-18 MED ORDER — TECHNETIUM TC 99M SESTAMIBI GENERIC - CARDIOLITE
30.0000 | Freq: Once | INTRAVENOUS | Status: AC | PRN
  Administered 2013-06-17: 30 via INTRAVENOUS

## 2013-06-18 NOTE — Progress Notes (Signed)
[facility]  2D Echocardiogram completed.  Norelle Runnion L Kasondra Junod, RDCS 06/18/2013 9:58 AM

## 2013-07-05 ENCOUNTER — Other Ambulatory Visit: Payer: Self-pay

## 2013-07-05 DIAGNOSIS — Z1231 Encounter for screening mammogram for malignant neoplasm of breast: Secondary | ICD-10-CM

## 2013-08-14 ENCOUNTER — Ambulatory Visit (INDEPENDENT_AMBULATORY_CARE_PROVIDER_SITE_OTHER): Payer: 59 | Admitting: Family Medicine

## 2013-08-14 VITALS — BP 140/90 | HR 76 | Temp 98.6°F | Resp 16 | Ht 65.0 in | Wt 254.0 lb

## 2013-08-14 DIAGNOSIS — T310 Burns involving less than 10% of body surface: Secondary | ICD-10-CM

## 2013-08-14 MED ORDER — SILVER SULFADIAZINE 1 % EX CREA: TOPICAL_CREAM | Freq: Two times a day (BID) | CUTANEOUS | Status: DC

## 2013-08-14 MED ORDER — CEPHALEXIN 500 MG PO CAPS: 500.0000 mg | ORAL_CAPSULE | Freq: Four times a day (QID) | ORAL | Status: DC

## 2013-08-14 NOTE — Patient Instructions (Signed)
Continue the sulfadiazine cream twice daily until completely healed.  If you have any worsening of the burn - any more tenderness, more swelling, more redness, any drainage, any mass develop - then start the oral antibiotic cephalexin and come back to clinic for recheck.   Burn Care Your skin is a natural barrier to infection. It is the largest organ of your body. Burns damage this natural protection. To help prevent infection, it is very important to follow your caregiver's instructions in the care of your burn. Burns are classified as:  First degree. There is only redness of the skin (erythema). No scarring is expected.  Second degree. There is blistering of the skin. Scarring may occur with deeper burns.  Third degree. All layers of the skin are injured, and scarring is expected. HOME CARE INSTRUCTIONS   Wash your hands well before changing your bandage.  Change your bandage as often as directed by your caregiver.  Remove the old bandage. If the bandage sticks, you may soak it off with cool, clean water.  Cleanse the burn thoroughly but gently with mild soap and water.  Pat the area dry with a clean, dry cloth.  Apply a thin layer of antibacterial cream to the burn.  Apply a clean bandage as instructed by your caregiver.  Keep the bandage as clean and dry as possible.  Elevate the affected area for the first 24 hours, then as instructed by your caregiver.  Only take over-the-counter or prescription medicines for pain, discomfort, or fever as directed by your caregiver. SEEK IMMEDIATE MEDICAL CARE IF:   You develop excessive pain.  You develop redness, tenderness, swelling, or red streaks near the burn.  The burned area develops yellowish-white fluid (pus) or a bad smell.  You have a fever. MAKE SURE YOU:   Understand these instructions.  Will watch your condition.  Will get help right away if you are not doing well or get worse. Document Released: 10/17/2005  Document Revised: 01/09/2012 Document Reviewed: 03/09/2011 [facility] Patient Information 2014 [place], [place].
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
## 2013-08-20 ENCOUNTER — Telehealth: Payer: Self-pay

## 2013-08-20 NOTE — Telephone Encounter (Signed)
Patient states she faxed over some ppw on the 16th to be completed by Dr. Clelia Croft. States that it is in regards to insurance information. Wants to know if we have received it.   [phone]

## 2013-08-23 NOTE — Telephone Encounter (Signed)
Did not see any ppw in Dr Alver Fisher box for this patient or at the Disability desk. Will check again.

## 2013-08-26 ENCOUNTER — Ambulatory Visit
Admission: RE | Admit: 2013-08-26 | Discharge: 2013-08-26 | Disposition: A | Discharge status: Home / Self Care | Payer: 59 | Source: Ambulatory Visit

## 2013-08-26 DIAGNOSIS — Z1231 Encounter for screening mammogram for malignant neoplasm of breast: Secondary | ICD-10-CM

## 2013-08-26 NOTE — Telephone Encounter (Signed)
Haven't seen anything on this patient.

## 2013-08-27 NOTE — Telephone Encounter (Signed)
I received the form and will put in Dr Alver Fisher box for completion.

## 2013-08-28 ENCOUNTER — Telehealth: Payer: Self-pay
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
## 2013-08-29 ENCOUNTER — Ambulatory Visit (INDEPENDENT_AMBULATORY_CARE_PROVIDER_SITE_OTHER): Payer: 59 | Admitting: Physician Assistant

## 2013-08-29 VITALS — BP 130/72 | HR 72 | Temp 98.3°F | Resp 18 | Ht 64.0 in | Wt 254.8 lb

## 2013-08-29 DIAGNOSIS — J069 Acute upper respiratory infection, unspecified: Secondary | ICD-10-CM

## 2013-08-29 MED ORDER — IPRATROPIUM BROMIDE 0.03 % NA SOLN: 2.0000 | Freq: Two times a day (BID) | NASAL | Status: DC

## 2013-08-29 NOTE — Patient Instructions (Signed)
Use the Qvar inhaler, 2 puffs twice each day. Use Flonase nasal spray, 2 sprays in each nostril, each day. Use the Zyrtec as before. Use the Ventolin (albuterol) inhaler every 4 hours, as needed for cough, shortness of breath or wheezing. The NEW nasal spray, Atrovent (ipratropium bromide) is to use 2 sprays in each nostril, twice each day.  Get plenty of rest and drink at least 64 ounces of water daily.

## 2013-08-29 NOTE — Telephone Encounter (Signed)
Notified pt form is ready 

## 2013-08-29 NOTE — Progress Notes (Signed)
  Subjective:    Patient ID: Deanna Simmons, female    DOB: 04/26/1951, 62 y.o.   MRN: [id number]  HPI This 62 y.o. female presents for evaluation of illness that began this afternoon. Was around someone who was coughing yesterday.  After lunch today (which she took outside, where it was "kind of chilly"), she developed nasal congestion and cough.  Took a dose on her Ventolin and Flonase. Took 2 Advil.  Was coming here to pick up some papers, so decided to check in an be seen.  "I just didn't feel good."  No fever/chills, no N/V/D.  No unexplained myalgias or arthralgias.  Cough is non-productive.  Feels a tickle in the throat.  Medications, allergies, past medical history, surgical history, family history, social history and problem list reviewed.   Review of Systems As above.    Objective:   Physical Exam  Blood pressure 130/72, pulse 72, temperature 98.3 F (36.8 C), temperature source Oral, resp. rate 18, height 5\' 4"  (1.626 m), weight 254 lb 12.8 oz (115.577 kg), SpO2 99.00%. Body mass index is 43.71 kg/(m^2). Well-developed, well nourished BF who is awake, alert and oriented, in NAD. HEENT: Arma/AT, PERRL, EOMI.  Sclera and conjunctiva are clear.  EAC are patent, TMs are normal in appearance. Nasal mucosa is pink and moist. OP is clear. Neck: supple, non-tender, no lymphadenopathy, thyromegaly. Heart: RRR, systolic murmur (previously evaluated) Lungs: normal effort, CTA Abdomen: normo-active bowel sounds, supple, non-tender, no mass or organomegaly. Extremities: no cyanosis, clubbing or edema. Skin: warm and dry without rash. Psychologic: good mood and appropriate affect, normal speech and behavior.      Assessment & Plan:  Viral URI with cough - Plan: ipratropium (ATROVENT) 0.03 % nasal spray  Anticipatory guidance.  Supportive care.  RTC PRN.  Fernande Bras, PA-C Physician Assistant-Certified [facility]

## 2013-08-29 NOTE — Telephone Encounter (Signed)
Form completed.

## 2013-09-20 ENCOUNTER — Encounter: Payer: Self-pay | Admitting: Family Medicine

## 2013-12-24 ENCOUNTER — Ambulatory Visit (INDEPENDENT_AMBULATORY_CARE_PROVIDER_SITE_OTHER): Payer: 59 | Admitting: Family Medicine

## 2013-12-24 VITALS — BP 132/78 | HR 73 | Temp 97.9°F | Resp 18 | Ht 64.0 in | Wt 256.4 lb

## 2013-12-24 DIAGNOSIS — R7309 Other abnormal glucose: Secondary | ICD-10-CM

## 2013-12-24 DIAGNOSIS — H9193 Unspecified hearing loss, bilateral: Secondary | ICD-10-CM

## 2013-12-24 DIAGNOSIS — R42 Dizziness and giddiness: Secondary | ICD-10-CM

## 2013-12-24 DIAGNOSIS — J309 Allergic rhinitis, unspecified: Secondary | ICD-10-CM

## 2013-12-24 DIAGNOSIS — H6980 Other specified disorders of Eustachian tube, unspecified ear: Secondary | ICD-10-CM

## 2013-12-24 DIAGNOSIS — H9190 Unspecified hearing loss, unspecified ear: Secondary | ICD-10-CM

## 2013-12-24 DIAGNOSIS — R739 Hyperglycemia, unspecified: Secondary | ICD-10-CM

## 2013-12-24 LAB — POCT CBC
Granulocyte percent: 42 %G (ref 37–80)
HEMATOCRIT: 38.4 % (ref 37.7–47.9)
Hemoglobin: 11.4 g/dL — AB (ref 12.2–16.2)
LYMPH, POC: 3.8 — AB (ref 0.6–3.4)
MCH, POC: 27.8 pg (ref 27–31.2)
MCHC: 29.7 g/dL — AB (ref 31.8–35.4)
MCV: 93.6 fL (ref 80–97)
MID (cbc): 0.6 (ref 0–0.9)
MPV: 8.2 fL (ref 0–99.8)
POC Granulocyte: 3.1 (ref 2–6.9)
POC LYMPH %: 50.3 % — AB (ref 10–50)
POC MID %: 7.7 %M (ref 0–12)
Platelet Count, POC: 264 10*3/uL (ref 142–424)
RBC: 4.1 M/uL (ref 4.04–5.48)
RDW, POC: 12.3 %
WBC: 7.5 10*3/uL (ref 4.6–10.2)

## 2013-12-24 LAB — GLUCOSE, POCT (MANUAL RESULT ENTRY): POC Glucose: 193 mg/dl — AB (ref 70–99)

## 2013-12-24 LAB — POCT GLYCOSYLATED HEMOGLOBIN (HGB A1C): HEMOGLOBIN A1C: 5.6

## 2013-12-24 NOTE — Progress Notes (Addendum)
Subjective:  This chart was scribed for Deanna Forts, MD by Donato Schultz, Medical Scribe. This patient was seen in Room 9 and the patient's care was started at 7:14 PM.   Patient ID: Deanna Simmons, female    DOB: 1951/06/05, 63 y.o.   MRN: [id number]  HPI HPI Comments: Deanna Simmons is a 63 y.o. female who presents to the [facility] complaining of intermittent, mild lightheadedness and constant ear "muffledness" that started yesterday.  The patient states that she has not been very lightheaded today.  She lists intermittent, associated dizziness with the lightheadedness.  She states that the ear "muffledness" is aggravated when she speaks.  She denies headache, blurred vision, numbness or tingling, tinnitus, nausea, post nasal drip, hearing loss, ear pain, cough, chest pain, sneezing, itchy eyes or throat, rhinorrhea, and nasal congestion as associated symptoms.  The patient denies using a nasal spray for her symptoms; she does have Flonase and Ocean nasal saline mist at home but has not been using them.  She denies experiencing these symptoms in the past.  She states that she takes Zyrtec everyday.  She denies starting any new medications.  The patient denies having a history of Vertigo.  She states that her PCP is Dr. Inda Merlin.  The patient states that she works at [employer].   She has known glucose intolerance; her fasting sugars run 100-120.  Recent HgbA1c with Dr. Inda Merlin was "normal".   Past Medical History  Diagnosis Date  . Asthma   . Coronary artery disease   . Hypercholesteremia   . Obesity   . Systemic lupus erythematosus     with nephritis  . Anemia   . Chondromalacia of knee   . Synovitis of knee   . Hypertension   . Pneumonia 07/2008  . Fibroids     h/o  . Internal hemorrhoids     h/o  . Diverticulum 12/11/2002    in cecum  . H/O tinea cruris 02/2010  . History of ASCVD     s/p CABG  . H/O elevated lipids   . Vulvitis     h/o  . Abnormal  finding on Pap smear, ASCUS   . Irregular menses     h/o  . MI (myocardial infarction) age 2   Past Surgical History  Procedure Laterality Date  . Coronary artery bypass graft    . Rotator cuff repair    . Cesarean section    . Tubal ligation    . Upper gastrointestinal endoscopy  12/11/2002  . Endometrial curettage  03/29/2000   Family History  Problem Relation Age of Onset  . Breast cancer Mother   . Diabetes Mother   . Stroke Mother   . Prostate cancer Father   . Diabetes Father   . Hyperlipidemia Father   . Heart attack Paternal Uncle     2 paternal uncles  . Ovarian cancer Cousin    History   Social History  . Marital Status: Married    Spouse Name: Alvester Chou    Number of Children: 4  . Years of Education: 12+   Occupational History  . C[employer] MANAGEMENT [employer]   Social History Main Topics  . Smoking status: Former Research scientist (life sciences)  . Smokeless tobacco: Never Used  . Alcohol Use: Yes     Comment: occasional  . Drug Use: No  . Sexual Activity: Not on file   Other Topics Concern  . Not on file   Social  History Narrative   Lives with her husband. Their children are grown, 1 lives in [place], 1 in [place], [place],  1 in [place], [place], and one in [place], [place].   Allergies  Allergen Reactions  . Biaxin [Clarithromycin] Itching  . Cardiolite [Technetium-91m] Itching  . Ciprofloxacin Itching  . Erythromycin Base Itching  . Montelukast Sodium Itching  . Niaspan [Niacin Er] Other (See Comments)    flushing  . Penicillins Itching  . Prilosec [Omeprazole] Itching  . Ticlid [Ticlopidine Hcl] Itching    Review of Systems  Constitutional: Negative for fever, chills, diaphoresis and fatigue.  HENT: Positive for hearing loss. Negative for congestion, ear discharge, ear pain, facial swelling, postnasal drip, rhinorrhea, sinus pressure, sneezing, sore throat, tinnitus, trouble swallowing and voice change.   Eyes: Negative for itching and visual disturbance.    Respiratory: Negative for cough and shortness of breath.   Cardiovascular: Negative for chest pain.  Gastrointestinal: Negative for nausea.  Neurological: Positive for dizziness and light-headedness. Negative for tremors, syncope, facial asymmetry, speech difficulty, weakness, numbness and headaches.     Objective:  Physical Exam  Nursing note and vitals reviewed. Constitutional: She is oriented to person, place, and time. She appears well-developed and well-nourished. No distress.  HENT:  Head: Normocephalic and atraumatic.  Right Ear: External ear normal.  Left Ear: External ear normal.  Nose: Nose normal. No mucosal edema or rhinorrhea. Right sinus exhibits no maxillary sinus tenderness and no frontal sinus tenderness. Left sinus exhibits no maxillary sinus tenderness and no frontal sinus tenderness.  Mouth/Throat: Oropharynx is clear and moist. No oropharyngeal exudate.  Eyes: Conjunctivae and EOM are normal. Pupils are equal, round, and reactive to light.  Neck: Normal range of motion. Neck supple. No thyromegaly present.  Cardiovascular: Normal rate and regular rhythm.   Murmur heard.  Systolic murmur is present with a grade of 2/6  Pulmonary/Chest: Effort normal and breath sounds normal. She has no wheezes. She has no rales.  Musculoskeletal: Normal range of motion.  Lymphadenopathy:    She has no cervical adenopathy.  Neurological: She is alert and oriented to person, place, and time. No cranial nerve deficit. She exhibits normal muscle tone. She displays a negative Romberg sign. Coordination normal.  Finger-to-nose intact.  Skin: Skin is warm and dry. She is not diaphoretic.  Psychiatric: She has a normal mood and affect. Her behavior is normal. Judgment and thought content normal.   Results for orders placed in visit on 12/24/13  POCT CBC      Result Value Ref Range   WBC 7.5  4.6 - 10.2 K/uL   Lymph, poc 3.8 (*) 0.6 - 3.4   POC LYMPH PERCENT 50.3 (*) 10 - 50 %L   MID  (cbc) 0.6  0 - 0.9   POC MID % 7.7  0 - 12 %M   POC Granulocyte 3.1  2 - 6.9   Granulocyte percent 42.0  37 - 80 %G   RBC 4.10  4.04 - 5.48 M/uL   Hemoglobin 11.4 (*) 12.2 - 16.2 g/dL   HCT, POC 38.4  37.7 - 47.9 %   MCV 93.6  80 - 97 fL   MCH, POC 27.8  27 - 31.2 pg   MCHC 29.7 (*) 31.8 - 35.4 g/dL   RDW, POC 12.3     Platelet Count, POC 264  142 - 424 K/uL   MPV 8.2  0 - 99.8 fL  GLUCOSE, POCT (MANUAL RESULT ENTRY)      Result  Value Ref Range   POC Glucose 193 (*) 70 - 99 mg/dl   AUDIOMETRY PERFORMED AND NORMAL.  BP 132/78  Pulse 73  Temp(Src) 97.9 F (36.6 C) (Oral)  Resp 18  Ht 5\' 4"  (1.626 m)  Wt 256 lb 6.4 oz (116.302 kg)  BMI 43.99 kg/m2  SpO2 96% Assessment & Plan:  Decreased hearing of both ears  Dizziness - Plan: POCT CBC, POCT glucose (manual entry), Comprehensive metabolic panel  Allergic rhinitis, cause unspecified  Hyperglycemia - Plan: POCT glycosylated hemoglobin (Hb A1C)  1.  Decreased hearing B: New. Consistent with eustachian tube dysfunction.  Continue Zyrtec 10mg  daily.  Start Flonase and Ocean nasal saline spray daily.   2 Eustachian tube dysfunction B:  New.  Continue Zyrtec; start Flonase and Ocean nasal saline spray daily. 3. Dizziness/lightheaded:  New.  Associated with elevated blood sugar.  Obtain CMET. Mild symptoms currently.  RTC for acute worsening. 4.  Allergic Rhinitis:  Persistent with likely worsening; restart Flonase; continue Zyrtec. 5.  Glucose Intolerance/hyperglycemia: worsening; advised to monitor sugars daily alternating with fasting and post-prandial checks.  Follow-up with PCP; likely contributing to lightheaded sensation.  No orders of the defined types were placed in this encounter.    I personally performed the services described in this documentation, which was scribed in my presence. The recorded information has been reviewed and is accurate.  Deanna Simmons, M.D.  [facility] [address] [place], [place]  [address] [phone] phone [phone] fax

## 2013-12-24 NOTE — Patient Instructions (Signed)
1.  Restart Flonase 2 sprays in each nostril daily.   2.  Recommend using nasal saline once daily. 3.  Check blood sugar once daily.

## 2013-12-25 ENCOUNTER — Encounter: Payer: Self-pay | Admitting: Family Medicine

## 2013-12-25 LAB — COMPREHENSIVE METABOLIC PANEL
ALT: 24 U/L (ref 0–35)
AST: 24 U/L (ref 0–37)
Albumin: 4.1 g/dL (ref 3.5–5.2)
Alkaline Phosphatase: 81 U/L (ref 39–117)
BILIRUBIN TOTAL: 0.3 mg/dL (ref 0.2–1.2)
BUN: 13 mg/dL (ref 6–23)
CALCIUM: 9.3 mg/dL (ref 8.4–10.5)
CHLORIDE: 102 meq/L (ref 96–112)
CO2: 29 meq/L (ref 19–32)
CREATININE: 0.78 mg/dL (ref 0.50–1.10)
GLUCOSE: 167 mg/dL — AB (ref 70–99)
Potassium: 4.7 mEq/L (ref 3.5–5.3)
Sodium: 137 mEq/L (ref 135–145)
TOTAL PROTEIN: 7.2 g/dL (ref 6.0–8.3)

## 2013-12-26 ENCOUNTER — Encounter: Payer: Self-pay | Admitting: Family Medicine

## 2014-01-14 ENCOUNTER — Other Ambulatory Visit (HOSPITAL_COMMUNITY): Payer: Self-pay | Admitting: Interventional Cardiology

## 2014-02-21 ENCOUNTER — Other Ambulatory Visit: Payer: Self-pay | Admitting: Obstetrics and Gynecology

## 2014-02-21 DIAGNOSIS — N63 Unspecified lump in unspecified breast: Secondary | ICD-10-CM

## 2014-02-28 ENCOUNTER — Ambulatory Visit
Admission: RE | Admit: 2014-02-28 | Discharge: 2014-02-28 | Disposition: A | Discharge status: Home / Self Care | Payer: 59 | Source: Ambulatory Visit | Attending: Obstetrics and Gynecology | Admitting: Obstetrics and Gynecology

## 2014-02-28 DIAGNOSIS — N63 Unspecified lump in unspecified breast: Secondary | ICD-10-CM

## 2014-03-02 ENCOUNTER — Ambulatory Visit (INDEPENDENT_AMBULATORY_CARE_PROVIDER_SITE_OTHER): Payer: 59 | Admitting: Physician Assistant

## 2014-03-02 VITALS — BP 110/62 | HR 68 | Temp 98.2°F | Resp 18 | Ht 64.0 in | Wt 254.0 lb

## 2014-03-02 DIAGNOSIS — M79609 Pain in unspecified limb: Secondary | ICD-10-CM

## 2014-03-02 DIAGNOSIS — M79659 Pain in unspecified thigh: Secondary | ICD-10-CM

## 2014-03-02 DIAGNOSIS — T24119A Burn of first degree of unspecified thigh, initial encounter: Secondary | ICD-10-CM

## 2014-03-02 DIAGNOSIS — T310 Burns involving less than 10% of body surface: Secondary | ICD-10-CM

## 2014-03-02 DIAGNOSIS — T24111A Burn of first degree of right thigh, initial encounter: Secondary | ICD-10-CM
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
## 2014-03-02 NOTE — Patient Instructions (Signed)
Use the silvadene cream at home if the skin becomes raw and if it feels better on the burn. Call me if there is any sign of infection around the burn - this will be low likelihood due to the type of burn that you have.

## 2014-03-07 ENCOUNTER — Other Ambulatory Visit: Payer: 59

## 2014-03-11 ENCOUNTER — Ambulatory Visit (INDEPENDENT_AMBULATORY_CARE_PROVIDER_SITE_OTHER): Payer: 59 | Admitting: *Deleted

## 2014-03-11 DIAGNOSIS — E7800 Pure hypercholesterolemia, unspecified: Secondary | ICD-10-CM

## 2014-03-11 DIAGNOSIS — I10 Essential (primary) hypertension: Secondary | ICD-10-CM

## 2014-03-11 LAB — LIPID PANEL
CHOLESTEROL: 120 mg/dL (ref 0–200)
HDL: 41 mg/dL (ref >39.00)
LDL Cholesterol: 66 mg/dL (ref 0–99)
Total CHOL/HDL Ratio: 3
Triglycerides: 65 mg/dL (ref 0.0–149.0)
VLDL: 13 mg/dL (ref 0.0–40.0)

## 2014-03-11 LAB — HEPATIC FUNCTION PANEL
ALT: 23 U/L (ref 0–35)
AST: 22 U/L (ref 0–37)
Albumin: 3.6 g/dL (ref 3.5–5.2)
Alkaline Phosphatase: 61 U/L (ref 39–117)
BILIRUBIN DIRECT: 0 mg/dL (ref 0.0–0.3)
TOTAL PROTEIN: 7.1 g/dL (ref 6.0–8.3)
Total Bilirubin: 0.5 mg/dL (ref 0.2–1.2)

## 2014-03-13 ENCOUNTER — Telehealth: Payer: Self-pay

## 2014-03-13 NOTE — Telephone Encounter (Signed)
lmom.All at target. Recheck 1 year

## 2014-03-13 NOTE — Telephone Encounter (Signed)
Message copied by Lamar Laundry on Thu Mar 13, 2014 10:50 AM ------      Message from: Daneen Schick      Created: Wed Mar 12, 2014  8:09 AM       All at target. Recheck 1 year ------

## 2014-04-05 ENCOUNTER — Ambulatory Visit: Payer: 59

## 2014-04-05 ENCOUNTER — Encounter: Payer: 59 | Attending: Internal Medicine

## 2014-04-05 VITALS — Ht 65.0 in | Wt 253.6 lb

## 2014-04-05 DIAGNOSIS — Z713 Dietary counseling and surveillance: Secondary | ICD-10-CM | POA: Insufficient documentation

## 2014-04-05 DIAGNOSIS — E119 Type 2 diabetes mellitus without complications: Secondary | ICD-10-CM | POA: Insufficient documentation

## 2014-04-05 NOTE — Progress Notes (Signed)
Patient was seen on 04/05/14 for the complete diabetes self-management series at [facility]. This is a part of the [employer].  Current A1c = 6.5%  Handouts given during class include:  Living Well with Diabetes book  Carb Counting and Meal Planning book  Meal Plan Card  Carbohydrate guide  Meal planning worksheet  Low Sodium Flavoring Tips  The diabetes portion plate  Low Carbohydrate Snack Suggestions  A1c to eAG Conversion Chart  Diabetes Medications  Stress Management  Diabetes Recommended Care Schedule  Diabetes Success Plan  Core Class Satisfaction Survey  The following learning objectives were met by the patient during this course:  Describe diabetes  State some common risk factors for diabetes  Defines the role of glucose and insulin  Identifies type of diabetes and pathophysiology  Describe the relationship between diabetes and cardiovascular risk  State the members of the Healthcare Team  States the rationale for glucose monitoring  State when to test glucose  State their individual Target Range  State the importance of logging glucose readings  Describe how to interpret glucose readings  Identifies A1C target  Explain the correlation between A1c and eAG values  State symptoms and treatment of high blood glucose  State symptoms and treatment of low blood glucose  Explain proper technique for glucose testing  Identifies proper sharps disposal  Describe the role of different macronutrients on glucose  Explain how carbohydrates affect blood glucose  State what foods contain the most carbohydrates  Demonstrate carbohydrate counting  Demonstrate how to read Nutrition Facts food label  Describe effects of various fats on heart health  Describe the importance of good nutrition for health and healthy eating strategies  Describe techniques for managing your shopping, cooking and meal  planning  List strategies to follow meal plan when dining out  Describe the effects of alcohol on glucose and how to use it safely   State the amount of activity recommended for healthy living   Describe activities suitable for individual needs   Identify ways to regularly incorporate activity into daily life   Identify barriers to activity and ways to over come these barriers  Identify diabetes medications being personally used and their primary action for lowering glucose and possible side effects   Describe role of stress on blood glucose and develop strategies to address psychosocial issues   Identify diabetes complications and ways to prevent them  Explain how to manage diabetes during illness   Evaluate success in meeting personal goal   Establish 2-3 goals that they will plan to diligently work on until they return for the  61-monthfollow-up visit  Goals:  Follow Diabetes Meal Plan as instructed  Eat 3 meals and 2 snacks, every 3-5 hrs  Limit carbohydrate intake to 45 grams carbohydrate/meal Limit carbohydrate intake to 15 grams carbohydrate/snack Add lean protein foods to meals/snacks  Monitor glucose levels as instructed by your doctor  Aim for 15-30 mins of physical activity daily as tolerated  Bring food record and glucose log to all healthcare visits  Your patient has established the following 4 month goals in their individualized success plan: I will count my carb choices at most meals and snacks I will increase my activity level at least 5 days a week I will take my diabetes medications as scheduled Will reduce my risk factors by eating less carbohydrates and lower my fat intake I will test my glucose at least 2 times a day, 3  days a week To help manage stress I will do exercise at least 3 times a week  Your patient has identified these potential barriers to change:  Motivation   Your patient has identified their diabetes self-care support plan as  [employer]  Support Group  American Diabetes Association web site  Self motivation  spiritual   Plan: Attend Core 4 in 4 months

## 2014-05-08 ENCOUNTER — Telehealth: Payer: Self-pay

## 2014-05-08 ENCOUNTER — Other Ambulatory Visit (HOSPITAL_COMMUNITY): Payer: Self-pay | Admitting: Interventional Cardiology

## 2014-05-08 MED ORDER — ROSUVASTATIN CALCIUM 40 MG PO TABS: 40.0000 mg | ORAL_TABLET | Freq: Every day | ORAL | Status: DC

## 2014-05-08 MED ORDER — EZETIMIBE 10 MG PO TABS: 10.0000 mg | ORAL_TABLET | Freq: Every day | ORAL | Status: DC

## 2014-05-09 NOTE — Telephone Encounter (Signed)
refilled 

## 2014-05-10 ENCOUNTER — Ambulatory Visit: Payer: 59

## 2014-07-14 ENCOUNTER — Other Ambulatory Visit: Payer: Self-pay

## 2014-07-14 DIAGNOSIS — Z1231 Encounter for screening mammogram for malignant neoplasm of breast: Secondary | ICD-10-CM

## 2014-07-18 ENCOUNTER — Ambulatory Visit (INDEPENDENT_AMBULATORY_CARE_PROVIDER_SITE_OTHER): Payer: 59 | Admitting: Interventional Cardiology

## 2014-07-18 ENCOUNTER — Encounter: Payer: Self-pay | Admitting: Interventional Cardiology

## 2014-07-18 VITALS — BP 146/80 | HR 72 | Ht 65.0 in | Wt 256.0 lb

## 2014-07-18 DIAGNOSIS — I350 Nonrheumatic aortic (valve) stenosis: Secondary | ICD-10-CM

## 2014-07-18 DIAGNOSIS — E7800 Pure hypercholesterolemia, unspecified: Secondary | ICD-10-CM

## 2014-07-18 DIAGNOSIS — I252 Old myocardial infarction: Secondary | ICD-10-CM

## 2014-07-18 DIAGNOSIS — I25810 Atherosclerosis of coronary artery bypass graft(s) without angina pectoris: Secondary | ICD-10-CM

## 2014-07-18 DIAGNOSIS — I10 Essential (primary) hypertension: Secondary | ICD-10-CM

## 2014-07-18 DIAGNOSIS — I359 Nonrheumatic aortic valve disorder, unspecified: Secondary | ICD-10-CM

## 2014-07-18 NOTE — Patient Instructions (Signed)
Your physician recommends that you continue on your current medications as directed. Please refer to the Current Medication list given to you today.  Your physician has requested that you have an echocardiogram. Echocardiography is a painless test that uses sound waves to create images of your heart. It provides your doctor with information about the size and shape of your heart and how well your heart's chambers and valves are working. This procedure takes approximately one hour. There are no restrictions for this procedure. (To be scheduled 3-4 weeks prior to office visit)  Your physician wants you to follow-up in: 1 year with Dr.Smith  You will receive a reminder letter in the mail two months in advance. If you don't receive a letter, please call our office to schedule the follow-up appointment.

## 2014-07-18 NOTE — Progress Notes (Signed)
Patient ID: FLOSSIE WEXLER, female   DOB: Mar 02, 1951, 63 y.o.   MRN: [id number]    [address] [place], [place]  [address] Phone: [phone] Fax:  [phone]  Date:  07/18/2014   ID:  IKEA DEMICCO, DOB May 27, 1951, MRN [id number]  PCP:  Henrine Screws, MD   ASSESSMENT:  1. Coronary atherosclerosis with prior bypass surgery, saphenous vein graft to RCA do to restenosis and acute stent thrombosis. Asymptomatic 2. Moderate asymptomatic aortic stenosis 3. Hypertension 4. Hyperlipidemia 5. Diabetes mellitus  PLAN:  1. Maintain an active lifestyle 2. 2-D Doppler echocardiogram 2-4 weeks prior to the next office visit in one year to followup aortic stenosis and compare with a 2014 study 3. Call if angina, dyspnea, or syncope.   SUBJECTIVE: MELONEY FELD is a 63 y.o. female denies cardiopulmonary complaints. Having left knee trouble and therefore not able to be as active as she would like. She denies orthopnea PND. No palpitations or syncope. No lower extremity swelling.   Wt Readings from Last 3 Encounters:  07/18/14 256 lb (116.121 kg)  04/05/14 253 lb 9.6 oz (115.032 kg)  03/02/14 254 lb (115.214 kg)     Past Medical History  Diagnosis Date  . Asthma   . Coronary artery disease   . Hypercholesteremia   . Obesity   . Systemic lupus erythematosus     with nephritis  . Anemia   . Chondromalacia of knee   . Synovitis of knee   . Hypertension   . Pneumonia 07/2008  . Fibroids     h/o  . Internal hemorrhoids     h/o  . Diverticulum 12/11/2002    in cecum  . H/O tinea cruris 02/2010  . History of ASCVD     s/p CABG  . H/O elevated lipids   . Vulvitis     h/o  . Abnormal finding on Pap smear, ASCUS   . Irregular menses     h/o  . MI (myocardial infarction) age 41  . Glucose intolerance (impaired glucose tolerance)   . Diabetes mellitus without complication     Current Outpatient Prescriptions  Medication Sig Dispense Refill  . Albuterol  Sulfate (PROAIR HFA IN) Inhale 1-2 puffs into the lungs every 6 (six) hours as needed. wheezing      . aspirin 81 MG EC tablet Take 81 mg by mouth daily. Swallow whole.      . beclomethasone (QVAR) 40 MCG/ACT inhaler Inhale 2 puffs into the lungs 2 (two) times daily.        Marland Kitchen Bioflavonoid Products (VITAMIN C) CHEW Chew 1 tablet by mouth daily.      . Calcium-Vitamin D (CALTRATE 600 PLUS-VIT D PO) Take 1 tablet by mouth daily.      . cetirizine (ZYRTEC) 10 MG tablet Take 10 mg by mouth daily.      . Cholecalciferol (VITAMIN D3) 2000 UNITS TABS Take 1 tablet by mouth daily.      . CRESTOR 40 MG tablet TAKE 1 TABLET BY MOUTH ONCE DAILY  90 tablet  0  . ezetimibe (ZETIA) 10 MG tablet Take 1 tablet (10 mg total) by mouth daily.  30 tablet  2  . fish oil-omega-3 fatty acids 1000 MG capsule Take 2 g by mouth daily.      . fluticasone (FLONASE) 50 MCG/ACT nasal spray Place 2 sprays into the nose daily as needed. Nasal congestion      . ipratropium (ATROVENT) 0.03 % nasal  spray Place 2 sprays into the nose 2 (two) times daily.  30 mL  0  . metoprolol succinate (TOPROL-XL) 25 MG 24 hr tablet Take 25 mg by mouth daily.      . Multiple Vitamins-Minerals (MULTIVITAL PO) Take 1 tablet by mouth daily.      . nitroGLYCERIN (NITROSTAT) 0.4 MG SL tablet Place 1 tablet (0.4 mg total) under the tongue every 5 (five) minutes as needed for chest pain.  50 tablet  3  . pantoprazole (PROTONIX) 20 MG tablet Take 20 mg by mouth daily.       . silver sulfADIAZINE (SILVADENE) 1 % cream Apply topically 2 (two) times daily.  50 g  0  . VALTREX 500 MG tablet TAKE 1 TABLET BY MOUTH TWICE DAILY AS NEEDED  60 tablet  7  . [DISCONTINUED] metoprolol tartrate (LOPRESSOR) 25 MG tablet Take 25 mg by mouth daily.        No current facility-administered medications for this visit.    Allergies:    Allergies  Allergen Reactions  . Biaxin [Clarithromycin] Itching  . Cardiolite [Technetium-2m] Itching  . Ciprofloxacin Itching  .  Erythromycin Base Itching  . Montelukast Sodium Itching  . Niaspan [Niacin Er] Other (See Comments)    flushing  . Penicillins Itching  . Prilosec [Omeprazole] Itching  . Ticlid [Ticlopidine Hcl] Itching    Social History:  The patient  reports that she has quit smoking. She has never used smokeless tobacco. She reports that she drinks alcohol. She reports that she does not use illicit drugs.   ROS:  Please see the history of present illness.   Appetite is stable. No claudication. No neurological complaints   All other systems reviewed and negative.   OBJECTIVE: VS:  BP 146/80  Pulse 72  Ht 5\' 5"  (1.651 m)  Wt 256 lb (116.121 kg)  BMI 42.60 kg/m2 Well nourished, well developed, in no acute distress, obese HEENT: normal Neck: JVD flat. Carotid bruit bilateral and transmitted from the aortic valve  Cardiac:  normal S1, S2; RRR; 2-3/6 crescendo decrescendo murmur of aortic stenosis Lungs:  clear to auscultation bilaterally, no wheezing, rhonchi or rales Abd: soft, nontender, no hepatomegaly Ext: Edema absent. Pulses 2+  Skin: warm and dry Neuro:  CNs 2-12 intact, no focal abnormalities noted  EKG: Normal sinus rhythm and    small inferior Q waves    Signed, Illene Labrador III, MD 07/18/2014 1:51 PM

## 2014-08-04 ENCOUNTER — Encounter: Payer: 59 | Attending: Internal Medicine

## 2014-08-04 DIAGNOSIS — Z713 Dietary counseling and surveillance: Secondary | ICD-10-CM | POA: Diagnosis not present

## 2014-08-04 DIAGNOSIS — E119 Type 2 diabetes mellitus without complications: Secondary | ICD-10-CM | POA: Insufficient documentation

## 2014-08-05 NOTE — Progress Notes (Signed)
Class start time: 9:10   End time: 10:05  Patient was seen on 08/04/2014 for a review of the series of three diabetes self-management courses at [facility]. The following learning objectives were met by the patient during this class:    Reviewed blood glucose monitoring and interpretation including the recommended target ranges and Hgb A1c.    Reviewed on carb counting, importance of regularly scheduled meals/snacks, and meal planning.    Reviewed the effects of physical activity on glucose levels and long-term glucose control.  Recommended goal of 150 minutes of physical activity/week.   Reviewed patient medications and discussed role of medication on blood glucose and possible side effects.   Discussed strategies to manage stress, psychosocial issues, and other obstacles to diabetes management.   Encouraged moderate weight reduction to improve glucose levels.     Reviewed short-term complications: hyper- and hypo-glycemia.  Discussed causes, symptoms, and treatment options.   Reviewed prevention, detection, and treatment of long-term complications.  Discussed the role of prolonged elevated glucose levels on body systems.  Goals:  Follow Diabetes Meal Plan as instructed  Eat 3 meals and 2 snacks, every 3-5 hrs  Limit carbohydrate intake to 45 grams carbohydrate/meal Limit carbohydrate intake to 15 grams carbohydrate/snack Add lean protein foods to meals/snacks  Monitor glucose levels as instructed by your doctor  Aim for goal of 15-30 mins of physical activity daily as tolerated  Bring food record and glucose log to your next nutrition visit

## 2014-08-27 ENCOUNTER — Ambulatory Visit
Admission: RE | Admit: 2014-08-27 | Discharge: 2014-08-27 | Disposition: A | Discharge status: Home / Self Care | Payer: 59 | Source: Ambulatory Visit

## 2014-08-27 DIAGNOSIS — Z1231 Encounter for screening mammogram for malignant neoplasm of breast: Secondary | ICD-10-CM

## 2014-08-28 ENCOUNTER — Other Ambulatory Visit: Payer: Self-pay | Admitting: Obstetrics and Gynecology

## 2014-08-28 DIAGNOSIS — R928 Other abnormal and inconclusive findings on diagnostic imaging of breast: Secondary | ICD-10-CM

## 2014-09-01 ENCOUNTER — Other Ambulatory Visit: Payer: Self-pay | Admitting: Interventional Cardiology

## 2014-09-01 ENCOUNTER — Encounter: Payer: Self-pay | Admitting: Interventional Cardiology

## 2014-09-12 ENCOUNTER — Ambulatory Visit
Admission: RE | Admit: 2014-09-12 | Discharge: 2014-09-12 | Disposition: A | Discharge status: Home / Self Care | Payer: 59 | Source: Ambulatory Visit | Attending: Obstetrics and Gynecology | Admitting: Obstetrics and Gynecology

## 2014-09-12 DIAGNOSIS — R928 Other abnormal and inconclusive findings on diagnostic imaging of breast: Secondary | ICD-10-CM

## 2015-01-12 ENCOUNTER — Telehealth: Payer: Self-pay | Admitting: Interventional Cardiology

## 2015-01-12 NOTE — Telephone Encounter (Signed)
New Msg         Pt calling.   Would like a call back no details given.

## 2015-01-12 NOTE — Telephone Encounter (Signed)
Returned pt call.pt sts that her husband has been prescribed Welchol. She likes the way it taste and has been taking it periodically.sher would like to know if Dr.Smith would recommend it, instead of one of her other cholesterol medications.adv her that I will fwd the question to Dr.Smith and call back with his recommendation.pt verbalized understanding.

## 2015-01-13 NOTE — Telephone Encounter (Signed)
I would not change to Welchol.

## 2015-01-14 NOTE — Telephone Encounter (Signed)
Pt aware of Dr.Smith's response. He would not change to [facility]. Pt verbalized understanding.

## 2015-01-14 NOTE — Telephone Encounter (Signed)
Follow up  ° ° ° °Returning call back to nurse  °

## 2015-01-14 NOTE — Telephone Encounter (Signed)
called to give pt Dr.Smith's recommendation.lmtcb

## 2015-02-06 ENCOUNTER — Other Ambulatory Visit: Payer: Self-pay | Admitting: Obstetrics and Gynecology

## 2015-02-06 DIAGNOSIS — R921 Mammographic calcification found on diagnostic imaging of breast: Secondary | ICD-10-CM

## 2015-03-06 ENCOUNTER — Ambulatory Visit (INDEPENDENT_AMBULATORY_CARE_PROVIDER_SITE_OTHER): Payer: 59 | Admitting: Internal Medicine

## 2015-03-06 ENCOUNTER — Ambulatory Visit (INDEPENDENT_AMBULATORY_CARE_PROVIDER_SITE_OTHER): Payer: 59

## 2015-03-06 VITALS — BP 132/80 | HR 82 | Temp 98.1°F | Resp 16 | Ht 64.0 in | Wt 250.0 lb

## 2015-03-06 DIAGNOSIS — M79644 Pain in right finger(s): Secondary | ICD-10-CM

## 2015-03-06 DIAGNOSIS — M25531 Pain in right wrist: Secondary | ICD-10-CM

## 2015-03-06 NOTE — Patient Instructions (Signed)
Your thumb xrays were normal. No fractures or dislocations. Please wear the splint when you are busy doing things for the next week. When home please be sure to take the splint off and get good range of motion with your thumb. Applying ice to the area several times day for next few days will help. Please let us know if you're not better in 1 week.

## 2015-03-06 NOTE — Progress Notes (Signed)
   Subjective:    Patient ID: Deanna Simmons, female    DOB: [birth year], 64 y.o.   MRN: [id number]  Chief Complaint  Patient presents with  . Hand Pain    fell on right hand today at noon    No chest pain, SOB, HA, dizziness, vision change, N/V, diarrhea, constipation, dysuria, urinary urgency or frequency, myalgias, arthralgias or rash.  HPI  64 yof presents after fall onto right hand today.   Was walking and tripped. Fell onto right hand, unsure how exactly her hand landed. Base of right thumb has been hurting since fall. Did not hit head. No other pain.   Review of Systems No fever. No chills.     Objective:   Physical Exam  Constitutional: She is oriented to person, place, and time. She appears well-developed and well-nourished.  Non-toxic appearance. She does not have a sickly appearance. She does not appear ill. No distress.  BP 132/80 mmHg  Pulse 82  Temp(Src) 98.1 F (36.7 C)  Resp 16  Ht 5\' 4"  (1.626 m)  Wt 250 lb (113.399 kg)  BMI 42.89 kg/m2  SpO2 98%   Musculoskeletal:       Right wrist: She exhibits normal range of motion, no tenderness, no bony tenderness, no effusion and no deformity.       Right hand: She exhibits tenderness, bony tenderness and swelling. She exhibits normal capillary refill. Normal sensation noted. Normal strength noted.       Hands: Tenderness, swelling over right thumb mcp joint. Decreased rom thumb due to pain. Normal cap refill. Normal sensation. No ttp over wrist bones. No anatomic snuffbox tenderness. No laxity with thumb movement.   Neurological: She is alert and oriented to person, place, and time.   [facility] reading (PRIMARY) by  Dr. Laney Pastor. Right wrist Findings: Normal Right thumb Findings: Normal     Assessment & Plan:   64 yof presents after fall onto right hand today.   Thumb pain, right - Plan: DG Finger Thumb Right Wrist pain, acute, right - Plan: DG Wrist Complete Right --Normal xrays --suspect strain --thumb spica  when active, range of motion with thumb while at home resting --rest, ice for next couple days --rtc if no improvement one wk  Julieta Gutting, PA-C Physician Assistant-Certified [facility]  03/06/2015 5:47 PM  I have participated in the care of this patient with the Advanced Practice Provider and agree with Diagnosis and Plan as documented. Robert P. Laney Pastor, M.D.

## 2015-03-16 ENCOUNTER — Ambulatory Visit
Admission: RE | Admit: 2015-03-16 | Discharge: 2015-03-16 | Disposition: A | Discharge status: Home / Self Care | Payer: 59 | Source: Ambulatory Visit | Attending: Obstetrics and Gynecology | Admitting: Obstetrics and Gynecology

## 2015-03-16 DIAGNOSIS — R921 Mammographic calcification found on diagnostic imaging of breast: Secondary | ICD-10-CM

## 2015-04-27 ENCOUNTER — Other Ambulatory Visit: Payer: Self-pay

## 2015-04-30 ENCOUNTER — Telehealth: Payer: Self-pay

## 2015-05-05 ENCOUNTER — Other Ambulatory Visit: Payer: Self-pay | Admitting: Interventional Cardiology

## 2015-05-26 ENCOUNTER — Ambulatory Visit: Payer: 59 | Admitting: *Deleted

## 2015-06-02 ENCOUNTER — Other Ambulatory Visit: Payer: Self-pay | Admitting: *Deleted

## 2015-06-02 NOTE — Patient Outreach (Signed)
[facility]) Care Management   06/02/2015  Deanna Simmons 1951-04-11 [id number]  Deanna Simmons is an 64 y.o. female who presents for routine Link To Wellness follow up for Type II DM self management assistance.  Subjective:  Deanna Simmons has no complaints. Says she continues to exercise at least 5 times a week but is frustrated that she cannot lose weight. Says she is very proud of her son who has lost 30+ lbs by healthy eating and exercise. Says she has an appointment for her annual wellness exam on 07/01/15.  Objective:   Review of Systems  Constitutional: Negative.     Physical Exam  Constitutional: She is oriented to person, place, and time.  Neurological: She is alert and oriented to person, place, and time.  Skin: Skin is warm and dry.  Psychiatric: She has a normal mood and affect. Her behavior is normal. Judgment and thought content normal.   Filed Vitals:   06/02/15 1531  BP: 108/72    Current Medications:   Current Outpatient Prescriptions  Medication Sig Dispense Refill  . Albuterol Sulfate (PROAIR HFA IN) Inhale 1-2 puffs into the lungs every 6 (six) hours as needed. wheezing    . aspirin 81 MG EC tablet Take 81 mg by mouth daily. Swallow whole.    . beclomethasone (QVAR) 40 MCG/ACT inhaler Inhale 2 puffs into the lungs 2 (two) times daily.      Marland Kitchen BIOGAIA PROBIOTIC (BIOGAIA PROBIOTIC) LIQD Take by mouth daily at 8 pm.    . cetirizine (ZYRTEC) 10 MG tablet Take 10 mg by mouth daily.    . Cholecalciferol (VITAMIN D3) 2000 UNITS TABS Take 1 tablet by mouth daily.    . CRESTOR 40 MG tablet TAKE 1 TABLET BY MOUTH ONCE DAILY 90 tablet 0  . ezetimibe (ZETIA) 10 MG tablet Take 1 tablet (10 mg total) by mouth daily. 30 tablet 2  . fluticasone (FLONASE) 50 MCG/ACT nasal spray Place 2 sprays into the nose daily as needed. Nasal congestion    . ipratropium (ATROVENT) 0.03 % nasal spray Place 2 sprays into the nose 2 (two) times daily. 30 mL 0  . magnesium gluconate  (MAGONATE) 500 MG tablet Take 500 mg by mouth once.    . metFORMIN (GLUCOPHAGE) 500 MG tablet Take by mouth 2 (two) times daily with a meal. 1/2 tablet daily    . metoprolol succinate (TOPROL-XL) 25 MG 24 hr tablet TAKE 1 TABLET BY MOUTH ONCE DAILY 90 tablet 3  . Multiple Vitamins-Minerals (MULTIVITAL PO) Take 1 tablet by mouth daily.    . pantoprazole (PROTONIX) 20 MG tablet Take 20 mg by mouth daily.     Marland Kitchen VALTREX 500 MG tablet TAKE 1 TABLET BY MOUTH TWICE DAILY AS NEEDED 60 tablet 7  . Bioflavonoid Products (VITAMIN C) CHEW Chew 1 tablet by mouth daily.    . fish oil-omega-3 fatty acids 1000 MG capsule Take 2 g by mouth daily.    . nitroGLYCERIN (NITROSTAT) 0.4 MG SL tablet Place 1 tablet (0.4 mg total) under the tongue every 5 (five) minutes as needed for chest pain. (Patient not taking: Reported on 06/02/2015) 50 tablet 3  . [DISCONTINUED] metoprolol tartrate (LOPRESSOR) 25 MG tablet Take 25 mg by mouth daily.      No current facility-administered medications for this visit.   Functional Status Survey: Is the patient deaf or have difficulty hearing?: No Does the patient have difficulty seeing, even when wearing glasses/contacts?: No Does the patient have difficulty  concentrating, remembering, or making decisions?: No Does the patient have difficulty walking or climbing stairs?: No Does the patient have difficulty dressing or bathing?: No Does the patient have difficulty doing errands alone such as visiting a doctor's office or shopping?: No  THN CM Care Plan Problem One        Patient Outreach from 06/02/2015 in [place] Problem One  Type II DM with good glycemic control as evidenced by 99% of home CBG readings meeting target and A1C= 6.7% on 06/23/14   Care Plan for Problem One  Active   THN Long Term Goal (31-90 days)  Ongoing good glycemic control as evidenced by meeting A1C target of <7.0% and 75% of home CBG reading meeting target   [place] Goal Start  Date  06/03/15   Interventions for Problem One Long Term Goal  reviewed basic pathophysiology of Type II DM, reviewed CBG meter history, discussed treatment of hypoglycemia as she had one CBG reading= 59,  congratulated Deanna Simmons on her consistent exercise routine, discussed positive effect of exercise on insulin sensitivity, reviewed DM medication of Metformin and mechanism of action , reviewed upcoming appointments with MD, advised Deanna Simmons she will be contacted about  timing of Link To Wellness based on lab results when she sees Dr. Inda Merlin  on 07/01/15       Fall/Depression Screening:    PHQ 2/9 Scores 04/05/2014  PHQ - 2 Score 0    Assessment:  [employer] employee and Link To Wellness member with Type II Dm, HTN and dyslipidemia currently meeting treatment targets.  Plan:  RNCM to fax today's office visit note to Dr. Inda Merlin. RNCM will meet at least twice yearly and as needed with patient per Link To Wellness program guidelines to assist with Type II DM, HTN and dyslipidemia self-management and assess patient's progress toward mutually set goals.  Barrington Ellison RN,CCM,CDE [place] Management Coordinator Link To Wellness Office Phone [phone] Office Fax [phone]

## 2015-06-04 NOTE — Addendum Note (Signed)
Addended by: Barrington Ellison on: 06/04/2015 01:36 PM   Modules accepted: Medications

## 2015-06-05 ENCOUNTER — Other Ambulatory Visit: Payer: Self-pay

## 2015-06-05 ENCOUNTER — Other Ambulatory Visit: Payer: Self-pay | Admitting: Interventional Cardiology

## 2015-06-05 MED ORDER — EZETIMIBE 10 MG PO TABS: 10.0000 mg | ORAL_TABLET | Freq: Every day | ORAL | Status: DC

## 2015-07-01 ENCOUNTER — Encounter: Payer: Self-pay | Admitting: *Deleted

## 2015-07-23 NOTE — Patient Outreach (Signed)
Deanna Simmons saw Dr. Inda Merlin on 8/31 for her complete physical exam and her A1C was 6.5%. She will see him again on 10/20/15. Will contact Zonya about Link To Wellness follow up in January. Barrington Ellison RN,CCM,CDE [place] Management Coordinator Link To Wellness Office Phone [phone] Office Fax [phone]

## 2015-08-03 ENCOUNTER — Encounter: Payer: Self-pay | Admitting: Interventional Cardiology

## 2015-08-03 ENCOUNTER — Ambulatory Visit (INDEPENDENT_AMBULATORY_CARE_PROVIDER_SITE_OTHER): Payer: 59 | Admitting: Interventional Cardiology

## 2015-08-03 DIAGNOSIS — I25810 Atherosclerosis of coronary artery bypass graft(s) without angina pectoris: Secondary | ICD-10-CM | POA: Diagnosis not present

## 2015-08-03 DIAGNOSIS — R0989 Other specified symptoms and signs involving the circulatory and respiratory systems: Secondary | ICD-10-CM

## 2015-08-03 DIAGNOSIS — I350 Nonrheumatic aortic (valve) stenosis: Secondary | ICD-10-CM

## 2015-08-03 DIAGNOSIS — E7800 Pure hypercholesterolemia, unspecified: Secondary | ICD-10-CM

## 2015-08-03 DIAGNOSIS — I10 Essential (primary) hypertension: Secondary | ICD-10-CM

## 2015-08-03 NOTE — Patient Instructions (Signed)
Medication Instructions:  Your physician recommends that you continue on your current medications as directed. Please refer to the Current Medication list given to you today.   Labwork: None ordered  Testing/Procedures: Your physician has requested that you have an echocardiogram. Echocardiography is a painless test that uses sound waves to create images of your heart. It provides your doctor with information about the size and shape of your heart and how well your heart's chambers and valves are working. This procedure takes approximately one hour. There are no restrictions for this procedure.   Your physician has requested that you have a carotid duplex. This test is an ultrasound of the carotid arteries in your neck. It looks at blood flow through these arteries that supply the brain with blood. Allow one hour for this exam. There are no restrictions or special instructions.    Follow-Up: Your physician wants you to follow-up in: 1 year with Dr.Smith You will receive a reminder letter in the mail two months in advance. If you don't receive a letter, please call our office to schedule the follow-up appointment.   Any Other Special Instructions Will Be Listed Below (If Applicable). Your physician discussed the importance of regular exercise and recommended that you start or continue a regular exercise program for good health.

## 2015-08-03 NOTE — Progress Notes (Signed)
Cardiology Office Note   Date:  08/03/2015   ID:  Deanna Simmons, DOB [birth year], MRN [id number]  PCP:  Henrine Screws, MD  Cardiologist:  Sinclair Grooms, MD   Chief Complaint  Patient presents with  . Coronary Artery Disease  . Cardiac Valve Problem    AS      History of Present Illness: Deanna Simmons is a 64 y.o. female who presents for CAD with emergency CABG for failed PCI (remote), systolic murmur compatible with aortic stenosis, hypertension, hyperlipidemia, obesity, and diabetes mellitus.  Deanna Simmons is doing well. She has had some dyspnea related to diabetes. This has been a seasonal issue with her over time. She has not had angina. There is some dyspnea on exertion. She denies orthopnea, PND, and chest pain.  Past Medical History  Diagnosis Date  . Asthma   . Coronary artery disease   . Hypercholesteremia   . Obesity   . Systemic lupus erythematosus ([place])     with nephritis  . Anemia   . Chondromalacia of knee   . Synovitis of knee   . Hypertension   . Pneumonia 07/2008  . Fibroids     h/o  . Internal hemorrhoids     h/o  . Diverticulum 12/11/2002    in cecum  . H/O tinea cruris 02/2010  . History of ASCVD     s/p CABG  . H/O elevated lipids   . Vulvitis     h/o  . Abnormal finding on Pap smear, ASCUS   . Irregular menses     h/o  . MI (myocardial infarction) ([place]) age 71  . Glucose intolerance (impaired glucose tolerance)   . Diabetes mellitus without complication [facility])     Past Surgical History  Procedure Laterality Date  . Coronary artery bypass graft    . Rotator cuff repair    . Cesarean section    . Tubal ligation    . Upper gastrointestinal endoscopy  12/11/2002  . Endometrial curettage  03/29/2000     Current Outpatient Prescriptions  Medication Sig Dispense Refill  . Albuterol Sulfate (PROAIR HFA IN) Inhale 1-2 puffs into the lungs every 6 (six) hours as needed. wheezing    . aspirin 81 MG EC tablet Take 81 mg by mouth  daily. Swallow whole.    . beclomethasone (QVAR) 40 MCG/ACT inhaler Inhale 2 puffs into the lungs 2 (two) times daily.      . benzonatate (TESSALON) 200 MG capsule Take 200 mg by mouth daily.  0  . BIOGAIA PROBIOTIC (BIOGAIA PROBIOTIC) LIQD Take 15 mLs by mouth daily at 8 pm.     . cetirizine (ZYRTEC) 10 MG tablet Take 10 mg by mouth daily.    . Cholecalciferol (VITAMIN D3) 2000 UNITS TABS Take 1 tablet by mouth daily.    . CRESTOR 40 MG tablet TAKE 1 TABLET BY MOUTH ONCE DAILY 90 tablet 0  . ezetimibe (ZETIA) 10 MG tablet Take 1 tablet (10 mg total) by mouth daily. 30 tablet 11  . fluticasone (FLONASE) 50 MCG/ACT nasal spray Place 2 sprays into the nose daily as needed. Nasal congestion    . Magnesium 500 MG TABS Take 1 tablet by mouth daily.     . metFORMIN (GLUCOPHAGE) 500 MG tablet Take by mouth 2 (two) times daily with a meal. 1/2 tablet daily    . metFORMIN (GLUCOPHAGE) 500 MG tablet Take 250 mg by mouth daily.    . Multiple Vitamins-Minerals (MULTIVITAL PO)  Take 1 tablet by mouth daily.    . nitroGLYCERIN (NITROSTAT) 0.4 MG SL tablet Place 0.4 mg under the tongue every 5 (five) minutes as needed for chest pain (3 doses MAX).    Marland Kitchen pantoprazole (PROTONIX) 40 MG tablet Take 40 mg by mouth daily.  0  . valACYclovir (VALTREX) 500 MG tablet Take 500 mg by mouth daily as needed (cold sores/blisters).    . [DISCONTINUED] metoprolol tartrate (LOPRESSOR) 25 MG tablet Take 25 mg by mouth daily.      No current facility-administered medications for this visit.    Allergies:   Biaxin; Cardiolite; Ciprofloxacin; Erythromycin base; Montelukast sodium; Niaspan; Penicillins; Prilosec; and Ticlid    Social History:  The patient  reports that she has quit smoking. She has never used smokeless tobacco. She reports that she drinks alcohol. She reports that she does not use illicit drugs.   Family History:  The patient's family history includes Breast cancer in her mother; Diabetes in her father and  mother; Heart attack in her paternal uncle; Hyperlipidemia in her father; Ovarian cancer in her cousin; Prostate cancer in her father; Stroke in her mother.    ROS:  Please see the history of present illness.   Otherwise, review of systems are positive for snoring, excessive sweating, increasing weight, exertional intolerance, and occasional wheezing when asthma is poorly controlled.   All other systems are reviewed and negative.    PHYSICAL EXAM: VS:  BP 134/70 mmHg  Pulse 72  Ht 5\' 5"  (1.651 m)  Wt 114.034 kg (251 lb 6.4 oz)  BMI 41.84 kg/m2 , BMI Body mass index is 41.84 kg/(m^2). GEN: Well nourished, well developed, in no acute distress HEENT: normal Neck: no JVD, but there is a right carotid bruit. Cardiac: RRR.  There is 5-6/3 systolic murmur of AS. There is no rrub, or gallop. There is no edema. Respiratory:  clear to auscultation bilaterally, normal work of breathing. GI: soft, nontender, nondistended, + BS MS: no deformity or atrophy Skin: warm and dry, no rash Neuro:  Strength and sensation are intact Psych: euthymic mood, full affect   EKG:  EKG is ordered today. The ekg reveals a normal tracing with the exception of a nondiagnostic inferior Q waves. Overall essentially a normal tracing   Recent Labs: No results found for requested labs within last 365 days.    Lipid Panel    Component Value Date/Time   CHOL 120 03/11/2014 0841   TRIG 65.0 03/11/2014 0841   HDL 41.00 03/11/2014 0841   CHOLHDL 3 03/11/2014 0841   VLDL 13.0 03/11/2014 0841   LDLCALC 66 03/11/2014 0841      Wt Readings from Last 3 Encounters:  08/03/15 114.034 kg (251 lb 6.4 oz)  03/06/15 113.399 kg (250 lb)  07/18/14 116.121 kg (256 lb)      Other studies Reviewed: Additional studies/ records that were reviewed today include: No evidence of prior carotid Doppler evaluation. The findings include prior echo 2 years ago demonstrated mild left ear..    ASSESSMENT AND PLAN:  1. Coronary  artery disease involving autologous vein coronary bypass graft without angina pectoris No angina  2. Essential hypertension Good control  3. Moderate aortic stenosis Significant murmur heard right upper sternal border  4. Hypercholesteremia On therapy and followed by primary care  5. Right carotid bruit  Current medicines are reviewed at length with the patient today.  The patient has the following concerns regarding medicines: None.  The following changes/actions have been instituted:  2-D Doppler echocardiogram for aortic stenosis follow-up  Carotid Doppler bilateral  Aerobic activity and weight loss  Labs/ tests ordered today include:   Orders Placed This Encounter  Procedures  . EKG 12-Lead  . Echocardiogram     Disposition:   FU with HS in 1 year  Signed, Sinclair Grooms, MD  08/03/2015 11:17 AM    [address], [place], [place]  [address] Phone: [phone]; Fax: [phone]

## 2015-08-05 ENCOUNTER — Encounter (HOSPITAL_COMMUNITY): Payer: 59

## 2015-08-11 ENCOUNTER — Other Ambulatory Visit: Payer: Self-pay | Admitting: Obstetrics and Gynecology

## 2015-08-11 DIAGNOSIS — R921 Mammographic calcification found on diagnostic imaging of breast: Secondary | ICD-10-CM

## 2015-08-20 ENCOUNTER — Ambulatory Visit (HOSPITAL_COMMUNITY): Payer: 59

## 2015-08-25 ENCOUNTER — Other Ambulatory Visit: Payer: Self-pay

## 2015-08-25 ENCOUNTER — Ambulatory Visit (HOSPITAL_COMMUNITY)
Admission: RE | Admit: 2015-08-25 | Discharge: 2015-08-25 | Disposition: A | Discharge status: Home / Self Care | Payer: 59 | Source: Ambulatory Visit | Attending: Interventional Cardiology | Admitting: Interventional Cardiology

## 2015-08-25 ENCOUNTER — Ambulatory Visit (HOSPITAL_BASED_OUTPATIENT_CLINIC_OR_DEPARTMENT_OTHER): Payer: 59

## 2015-08-25 DIAGNOSIS — M329 Systemic lupus erythematosus, unspecified: Secondary | ICD-10-CM | POA: Diagnosis not present

## 2015-08-25 DIAGNOSIS — R0989 Other specified symptoms and signs involving the circulatory and respiratory systems: Secondary | ICD-10-CM | POA: Diagnosis not present

## 2015-08-25 DIAGNOSIS — Z951 Presence of aortocoronary bypass graft: Secondary | ICD-10-CM | POA: Insufficient documentation

## 2015-08-25 DIAGNOSIS — Z87891 Personal history of nicotine dependence: Secondary | ICD-10-CM | POA: Diagnosis not present

## 2015-08-25 DIAGNOSIS — E119 Type 2 diabetes mellitus without complications: Secondary | ICD-10-CM | POA: Diagnosis not present

## 2015-08-25 DIAGNOSIS — E785 Hyperlipidemia, unspecified: Secondary | ICD-10-CM | POA: Diagnosis not present

## 2015-08-25 DIAGNOSIS — Z8249 Family history of ischemic heart disease and other diseases of the circulatory system: Secondary | ICD-10-CM | POA: Diagnosis not present

## 2015-08-25 DIAGNOSIS — J45909 Unspecified asthma, uncomplicated: Secondary | ICD-10-CM | POA: Insufficient documentation

## 2015-08-25 DIAGNOSIS — I352 Nonrheumatic aortic (valve) stenosis with insufficiency: Secondary | ICD-10-CM | POA: Diagnosis not present

## 2015-08-25 DIAGNOSIS — I350 Nonrheumatic aortic (valve) stenosis: Secondary | ICD-10-CM

## 2015-08-25 DIAGNOSIS — I10 Essential (primary) hypertension: Secondary | ICD-10-CM | POA: Diagnosis not present

## 2015-08-25 DIAGNOSIS — I6523 Occlusion and stenosis of bilateral carotid arteries: Secondary | ICD-10-CM | POA: Diagnosis not present

## 2015-08-25 DIAGNOSIS — I2510 Atherosclerotic heart disease of native coronary artery without angina pectoris: Secondary | ICD-10-CM | POA: Insufficient documentation

## 2015-08-26 ENCOUNTER — Other Ambulatory Visit: Payer: Self-pay | Admitting: Cardiovascular Disease

## 2015-08-26 ENCOUNTER — Telehealth: Payer: Self-pay | Admitting: Interventional Cardiology

## 2015-08-26 ENCOUNTER — Other Ambulatory Visit: Payer: Self-pay | Admitting: *Deleted

## 2015-08-26 DIAGNOSIS — I350 Nonrheumatic aortic (valve) stenosis: Secondary | ICD-10-CM

## 2015-08-26 MED ORDER — METOPROLOL TARTRATE 25 MG PO TABS: 25.0000 mg | ORAL_TABLET | Freq: Two times a day (BID) | ORAL | Status: DC

## 2015-08-26 NOTE — Telephone Encounter (Signed)
New problem    Pt stated someone called her yesterday.

## 2015-08-26 NOTE — Telephone Encounter (Signed)
-----   Message from Belva Crome, MD sent at 08/25/2015  8:05 PM EDT ----- Aortic valve problem has progressed to moderate severity. This still requires close clinical follow-up. Repeat 2-D Doppler echo in one year. Aggressive blood pressure control is also necessary as she has significant LVH. Increase metoprolol tartrate to 25 mg twice a day if she is taking this medication only once per day as the medication list suggests.

## 2015-08-26 NOTE — Telephone Encounter (Signed)
Informed patient of results and verbal understanding expressed.  Repeat ECHO ordered to be scheduled in 1 year.  Instructed patient to INCREASE METOPROLOL to 25 mg BID as she verified she is taking medication once daily now. Patient agrees with treatment plan.

## 2015-08-26 NOTE — Telephone Encounter (Signed)
F/u   Pt returning Rn phone call concerning Echo results. Please call back and discuss.

## 2015-09-17 ENCOUNTER — Ambulatory Visit
Admission: RE | Admit: 2015-09-17 | Discharge: 2015-09-17 | Disposition: A | Discharge status: Home / Self Care | Payer: 59 | Source: Ambulatory Visit | Attending: Obstetrics and Gynecology | Admitting: Obstetrics and Gynecology

## 2015-09-17 DIAGNOSIS — R921 Mammographic calcification found on diagnostic imaging of breast: Secondary | ICD-10-CM

## 2015-09-22 ENCOUNTER — Ambulatory Visit: Payer: 59 | Admitting: Allergy and Immunology

## 2015-09-23 ENCOUNTER — Other Ambulatory Visit: Payer: Self-pay | Admitting: Interventional Cardiology

## 2015-09-23 NOTE — Telephone Encounter (Signed)
Pt's Rx was sent to pt's pharmacy as requested. Confirmation received.  °

## 2015-10-13 ENCOUNTER — Other Ambulatory Visit: Payer: Self-pay | Admitting: Allergy and Immunology

## 2015-11-03 ENCOUNTER — Other Ambulatory Visit: Payer: Self-pay | Admitting: *Deleted

## 2015-11-03 NOTE — Patient Outreach (Signed)
Secure e-mail to [facility] at her [facility] e-mail address requesting she provide dates and times that she can meet this month for routine Link To Wellness follow up. Await response from [facility].  Barrington Ellison RN,CCM,CDE [place] Management Coordinator Link To Wellness Office Phone [phone] Office Fax [phone]

## 2015-11-10 DIAGNOSIS — J40 Bronchitis, not specified as acute or chronic: Secondary | ICD-10-CM | POA: Diagnosis not present

## 2015-11-10 DIAGNOSIS — K219 Gastro-esophageal reflux disease without esophagitis: Secondary | ICD-10-CM | POA: Diagnosis not present

## 2015-11-10 DIAGNOSIS — J342 Deviated nasal septum: Secondary | ICD-10-CM | POA: Diagnosis not present

## 2015-11-10 DIAGNOSIS — R490 Dysphonia: Secondary | ICD-10-CM | POA: Diagnosis not present

## 2015-11-10 DIAGNOSIS — J329 Chronic sinusitis, unspecified: Secondary | ICD-10-CM | POA: Diagnosis not present

## 2015-11-10 MED FILL — PANTOPRAZOLE SOD DR 20 MG T: 20 | 90 days supply | Qty: 180 | Fill #0

## 2015-11-10 MED FILL — AZITHROMYCIN 250 MG TABLET: 250 | 10 days supply | Qty: 11 | Fill #0

## 2015-11-10 MED FILL — FLUCONAZOLE 150 MG TABLET: 150 | 9 days supply | Qty: 3 | Fill #0

## 2015-11-11 ENCOUNTER — Other Ambulatory Visit: Payer: Self-pay | Admitting: *Deleted

## 2015-11-11 NOTE — Patient Outreach (Signed)
Left message on patient's confidential work extension with request for Cinda to e-mail dates and times that she can meet for routine Link To Wellness follow up. Barrington Ellison RN,CCM,CDE [place] Management Coordinator Link To Wellness Office Phone [phone] Office Fax [phone]

## 2015-11-24 MED FILL — EZETIMIBE 10 MG TABLET: 10 | 90 days supply | Qty: 90 | Fill #4

## 2015-11-24 MED FILL — METOPROLOL TARTRATE 25 MG T: 25 | 30 days supply | Qty: 60 | Fill #2

## 2015-11-26 MED FILL — MAGNESIUM OXIDE 500 MG TAB: 500 | 100 days supply | Qty: 100 | Fill #2

## 2015-11-30 DIAGNOSIS — J069 Acute upper respiratory infection, unspecified: Secondary | ICD-10-CM | POA: Diagnosis not present

## 2015-11-30 DIAGNOSIS — J45909 Unspecified asthma, uncomplicated: Secondary | ICD-10-CM | POA: Diagnosis not present

## 2015-12-01 ENCOUNTER — Other Ambulatory Visit: Payer: Self-pay | Admitting: *Deleted

## 2015-12-01 MED FILL — VITAMIN D 2,000 UNIT SOFTGE: 50 MCG | 100 days supply | Qty: 100 | Fill #2

## 2015-12-02 ENCOUNTER — Encounter: Payer: Self-pay | Admitting: *Deleted

## 2015-12-02 NOTE — Patient Outreach (Signed)
[facility]) Care Management   12/01/15  Deanna Simmons 10/27/1951 [id number]  [name] is an 65 y.o. female who presents to the [place] Management office for routine Link To Wellness follow up for self management assistance with Type II DM, HTN and hyperlipidemia.  Subjective:  Deanna Simmons says she saw a MD yesterday for symptoms of head cold and asthma exacerbation. She said she also saw a ENT for chronic hoarseness and he increased her Protonix. Says she also saw Dr. Pernell Dupre for cardiology follow up on 08/03/15, had a cardiac echo and carotid duplex and received a good check up.  Objective:   Review of Systems  Constitutional: Negative.   Nose with frequent clear discharge.  Physical Exam  Constitutional: She is oriented to person, place, and time. She appears well-developed and well-nourished.  Respiratory: Effort normal.  Neurological: She is alert and oriented to person, place, and time.  Skin: Skin is warm and dry.  Psychiatric: She has a normal mood and affect. Her behavior is normal. Judgment and thought content normal.   Filed Vitals:   12/01/15 1636  BP: 125/80   Filed Weights   12/01/15 1636  Weight: 249 lb 12.8 oz (113.309 kg)    Current Medications:   Current Outpatient Prescriptions  Medication Sig Dispense Refill  . Albuterol Sulfate (PROAIR HFA IN) Inhale 1-2 puffs into the lungs every 6 (six) hours as needed. wheezing    . aspirin 81 MG EC tablet Take 81 mg by mouth daily. Swallow whole.    . beclomethasone (QVAR) 40 MCG/ACT inhaler Inhale 2 puffs into the lungs 2 (two) times daily.      Marland Kitchen BIOGAIA PROBIOTIC (BIOGAIA PROBIOTIC) LIQD Take 15 mLs by mouth daily at 8 pm.     . cetirizine (ZYRTEC) 10 MG tablet Take 10 mg by mouth daily.    . Cholecalciferol (VITAMIN D3) 2000 UNITS TABS Take 1 tablet by mouth daily.    Marland Kitchen ezetimibe (ZETIA) 10 MG tablet Take 1 tablet (10 mg total) by mouth daily. 30 tablet  11  . fluticasone (FLONASE) 50 MCG/ACT nasal spray Place 2 sprays into the nose daily as needed. Nasal congestion    . Magnesium 500 MG TABS Take 1 tablet by mouth daily.     . metFORMIN (GLUCOPHAGE) 500 MG tablet Take by mouth 2 (two) times daily with a meal. 1/2 tablet daily    . metFORMIN (GLUCOPHAGE) 500 MG tablet Take 250 mg by mouth daily.    . metoprolol tartrate (LOPRESSOR) 25 MG tablet Take 1 tablet (25 mg total) by mouth 2 (two) times daily. 60 tablet 6  . Multiple Vitamins-Minerals (MULTIVITAL PO) Take 1 tablet by mouth daily.    . pantoprazole (PROTONIX) 40 MG tablet TAKE 1 TABLET BY MOUTH ONCE DAILY FOR REFLUX 90 tablet 0  . rosuvastatin (CRESTOR) 40 MG tablet Take 1 tablet (40 mg total) by mouth daily. 90 tablet 3  . sodium chloride (OCEAN) 0.65 % SOLN nasal spray Place 1 spray into both nostrils as needed for congestion.    . benzonatate (TESSALON) 200 MG capsule Take 200 mg by mouth daily. Reported on 12/01/2015  0  . nitroGLYCERIN (NITROSTAT) 0.4 MG SL tablet Place 0.4 mg under the tongue every 5 (five) minutes as needed for chest pain (3 doses MAX). Reported on 12/01/2015    . valACYclovir (VALTREX) 500 MG tablet Take 500 mg by mouth daily as needed (cold sores/blisters). Reported on 12/01/2015  No current facility-administered medications for this visit.    Functional Status:   In your present state of health, do you have any difficulty performing the following activities: 12/01/2015 12/01/2015  Hearing? N N  Vision? N N  Difficulty concentrating or making decisions? N N  Walking or climbing stairs? N N  Dressing or bathing? N N  Doing errands, shopping? N N    Fall/Depression Screening:    PHQ 2/9 Scores 12/01/2015 06/02/2015 04/05/2014  PHQ - 2 Score 0 0 0    Assessment:   [employer] employee with Type II DM, HTN and hyperlipidemia and CAD. Meeting  treatment targets for all chronic disease states.   Plan:  [facility] CM Care Plan Problem One        Most Recent Value    Care Plan Problem One  Type II DM with good glycemic control as evidenced by 99% of home CBG readings meeting target and A1C= 6.7% on 06/23/14   Role Documenting the Problem One  Care Management [place] for Problem One  Active   [facility] Long Term Goal (31-90 days)  Ongoing good glycemic control as evidenced by meeting Hgb A1C target of <7.0% and 75% of home CBG reading meeting target and ongoing good control of HTN and hyperlipidemia as evidenced by BP readings <140/<90 and normal lipid profile at each assessment    [facility] Long Term Goal Start Date  12/01/15   [facility] Long Term Goal Met Date     Interventions for Problem One Long Term Goal  reviewed basic pathophysiology of Type II DM, reviewed changes to health history, reviewed CBG meter history, reviewed DM medication of Metformin and mechanism of action , reviewed upcoming appointments with MD, advised [name] she will be contacted about  timing of next Link To Wellness visit based on lab results when she sees Dr. Inda Merlin on 02/23/15    RNCM to fax today's office visit note to Dr. Inda Merlin. RNCM will meet quarterly and as needed with patient per Link To Wellness program guidelines to assist with Type II DM, HTN and hyperlipidemia self-management and assess patient's progress toward mutually set goals. Barrington Ellison RN,CCM,CDE [place] Management Coordinator Link To Wellness Office Phone [phone] Office Fax [phone]

## 2015-12-22 DIAGNOSIS — J069 Acute upper respiratory infection, unspecified: Secondary | ICD-10-CM | POA: Diagnosis not present

## 2015-12-22 DIAGNOSIS — J45909 Unspecified asthma, uncomplicated: Secondary | ICD-10-CM | POA: Diagnosis not present

## 2015-12-24 DIAGNOSIS — J069 Acute upper respiratory infection, unspecified: Secondary | ICD-10-CM | POA: Diagnosis not present

## 2015-12-24 MED FILL — predniSONE 10 MG (21) TBPK: 10 | 6 days supply | Qty: 21 | Fill #0

## 2015-12-24 MED FILL — HYDROCODONE-CHLORPHENIRAM S: 10-8 | 10 days supply | Qty: 100 | Fill #0

## 2015-12-24 MED FILL — AZITHROMYCIN 250 MG TABLET: 250 | 5 days supply | Qty: 6 | Fill #0

## 2015-12-25 MED FILL — TRUE METRIX GLUCOSE TEST ST: 90 days supply | Qty: 100 | Fill #0

## 2015-12-25 MED FILL — TRUEplus LANCETS 30G MISC: 90 days supply | Qty: 100 | Fill #0

## 2016-01-01 MED FILL — METOPROLOL TARTRATE 25 MG T: 25 | 90 days supply | Qty: 180 | Fill #3

## 2016-01-01 MED FILL — FLUCONAZOLE 150 MG TABLET: 150 | 9 days supply | Qty: 3 | Fill #1

## 2016-01-05 ENCOUNTER — Ambulatory Visit (INDEPENDENT_AMBULATORY_CARE_PROVIDER_SITE_OTHER): Payer: 59 | Admitting: Allergy and Immunology

## 2016-01-05 ENCOUNTER — Encounter: Payer: Self-pay | Admitting: Allergy and Immunology

## 2016-01-05 VITALS — BP 112/70 | HR 80 | Resp 16

## 2016-01-05 DIAGNOSIS — J387 Other diseases of larynx: Secondary | ICD-10-CM

## 2016-01-05 DIAGNOSIS — H1010 Acute atopic conjunctivitis, unspecified eye: Secondary | ICD-10-CM

## 2016-01-05 DIAGNOSIS — J4521 Mild intermittent asthma with (acute) exacerbation: Secondary | ICD-10-CM

## 2016-01-05 DIAGNOSIS — J309 Allergic rhinitis, unspecified: Secondary | ICD-10-CM

## 2016-01-05 DIAGNOSIS — K219 Gastro-esophageal reflux disease without esophagitis: Secondary | ICD-10-CM

## 2016-01-05 MED ORDER — MONTELUKAST SODIUM 10 MG PO TABS: ORAL_TABLET | ORAL | Status: DC

## 2016-01-05 MED ORDER — RANITIDINE HCL 300 MG PO TABS: ORAL_TABLET | ORAL | Status: DC

## 2016-01-05 NOTE — Progress Notes (Signed)
Follow-up Note  Referring Provider: Josetta Huddle, MD Primary Provider: Henrine Screws, MD Date of Office Visit: 01/05/2016  Subjective:   Deanna Simmons (DOB: 22-Dec-1950) is a 65 y.o. female who returns to the [facility] on 01/05/2016 in re-evaluation of the following:  HPI Comments: Nohelani returns to this clinic in evaluation of respiratory tract symptoms that developed over 1 month ago. At that point in time she developed a constellation of symptoms including cough and nasal congestion and raspiness and hoarseness and phlegm production or car her to go to the urgent care center on 2 occasions and have prednisone administered twice and azithromycin as well as Tussionex. She did start her action plan for asthma flare which included high-dose inhaled Qvar and used Ventolin. She is slightly better at this point in time although she still continues to have cough and some phlegm production and a little bit raspy. She no longer has any nasal congestion or ugly nasal discharge or anosmia. Apparently she had her pantoprazole increase to twice a day for some persistent issues with laryngitis earlier this year which appeared to take care of that issue. Prior to this point in time she really did not have any significant problems with her asthma or her nose.    Current outpatient prescriptions:  .  albuterol (VENTOLIN HFA) 108 (90 Base) MCG/ACT inhaler, Inhale 2 puffs into the lungs every 6 (six) hours as needed for wheezing or shortness of breath., Disp: , Rfl:  .  aspirin 81 MG EC tablet, Take 81 mg by mouth daily. Swallow whole., Disp: , Rfl:  .  beclomethasone (QVAR) 40 MCG/ACT inhaler, Inhale 2 puffs into the lungs 2 (two) times daily.  , Disp: , Rfl:  .  cetirizine (ZYRTEC) 10 MG tablet, Take 10 mg by mouth daily., Disp: , Rfl:  .  chlorpheniramine-HYDROcodone (TUSSIONEX) 10-8 MG/5ML SUER, , Disp: , Rfl: 0 .  Cholecalciferol (VITAMIN D3) 2000 UNITS TABS, Take 1 tablet by  mouth daily., Disp: , Rfl:  .  Dextromethorphan-Guaifenesin (MUCINEX DM PO), Take by mouth as needed., Disp: , Rfl:  .  ezetimibe (ZETIA) 10 MG tablet, Take 1 tablet (10 mg total) by mouth daily., Disp: 30 tablet, Rfl: 11 .  fluconazole (DIFLUCAN) 150 MG tablet, , Disp: , Rfl: 2 .  fluticasone (FLONASE) 50 MCG/ACT nasal spray, Place 2 sprays into the nose daily as needed. Nasal congestion, Disp: , Rfl:  .  Magnesium Oxide 500 MG TABS, , Disp: , Rfl: 3 .  metFORMIN (GLUCOPHAGE) 500 MG tablet, Take 250 mg by mouth daily., Disp: , Rfl:  .  metoprolol tartrate (LOPRESSOR) 25 MG tablet, Take 1 tablet (25 mg total) by mouth 2 (two) times daily., Disp: 60 tablet, Rfl: 6 .  Multiple Vitamins-Minerals (MULTIVITAL PO), Take 1 tablet by mouth daily., Disp: , Rfl:  .  nitroGLYCERIN (NITROSTAT) 0.4 MG SL tablet, Place 0.4 mg under the tongue every 5 (five) minutes as needed for chest pain (3 doses MAX). Reported on 12/01/2015, Disp: , Rfl:  .  pantoprazole (PROTONIX) 20 MG tablet, , Disp: , Rfl: 6 .  Probiotic Product (PROBIOTIC DAILY PO), Take by mouth daily., Disp: , Rfl:  .  rosuvastatin (CRESTOR) 40 MG tablet, Take 1 tablet (40 mg total) by mouth daily., Disp: 90 tablet, Rfl: 3 .  sodium chloride (OCEAN) 0.65 % SOLN nasal spray, Place 1 spray into both nostrils as needed for congestion., Disp: , Rfl:  .  valACYclovir (VALTREX) 500 MG  tablet, Take 500 mg by mouth daily as needed (cold sores/blisters). Reported on 12/01/2015, Disp: , Rfl:  .  benzonatate (TESSALON) 200 MG capsule, Take 200 mg by mouth daily. Reported on 01/05/2016, Disp: , Rfl: 0 .  BIOGAIA PROBIOTIC (BIOGAIA PROBIOTIC) LIQD, Take 15 mLs by mouth daily at 8 pm. Reported on 01/05/2016, Disp: , Rfl:  .  pantoprazole (PROTONIX) 40 MG tablet, TAKE 1 TABLET BY MOUTH ONCE DAILY FOR REFLUX, Disp: 90 tablet, Rfl: 0 .  TRUE METRIX BLOOD GLUCOSE TEST test strip, , Disp: , Rfl: 3 .  TRUEPLUS LANCETS 30G MISC, , Disp: , Rfl: 3  Outpatient Prescriptions  Prior to Visit  Medication Sig Dispense Refill  . aspirin 81 MG EC tablet Take 81 mg by mouth daily. Swallow whole.    . beclomethasone (QVAR) 40 MCG/ACT inhaler Inhale 2 puffs into the lungs 2 (two) times daily.      . cetirizine (ZYRTEC) 10 MG tablet Take 10 mg by mouth daily.    . Cholecalciferol (VITAMIN D3) 2000 UNITS TABS Take 1 tablet by mouth daily.    Marland Kitchen ezetimibe (ZETIA) 10 MG tablet Take 1 tablet (10 mg total) by mouth daily. 30 tablet 11  . fluticasone (FLONASE) 50 MCG/ACT nasal spray Place 2 sprays into the nose daily as needed. Nasal congestion    . metFORMIN (GLUCOPHAGE) 500 MG tablet Take 250 mg by mouth daily.    . metoprolol tartrate (LOPRESSOR) 25 MG tablet Take 1 tablet (25 mg total) by mouth 2 (two) times daily. 60 tablet 6  . Multiple Vitamins-Minerals (MULTIVITAL PO) Take 1 tablet by mouth daily.    . nitroGLYCERIN (NITROSTAT) 0.4 MG SL tablet Place 0.4 mg under the tongue every 5 (five) minutes as needed for chest pain (3 doses MAX). Reported on 12/01/2015    . rosuvastatin (CRESTOR) 40 MG tablet Take 1 tablet (40 mg total) by mouth daily. 90 tablet 3  . sodium chloride (OCEAN) 0.65 % SOLN nasal spray Place 1 spray into both nostrils as needed for congestion.    . valACYclovir (VALTREX) 500 MG tablet Take 500 mg by mouth daily as needed (cold sores/blisters). Reported on 12/01/2015    . Magnesium 500 MG TABS Take 1 tablet by mouth daily.     . benzonatate (TESSALON) 200 MG capsule Take 200 mg by mouth daily. Reported on 01/05/2016  0  . BIOGAIA PROBIOTIC (BIOGAIA PROBIOTIC) LIQD Take 15 mLs by mouth daily at 8 pm. Reported on 01/05/2016    . pantoprazole (PROTONIX) 40 MG tablet TAKE 1 TABLET BY MOUTH ONCE DAILY FOR REFLUX 90 tablet 0  . Albuterol Sulfate (PROAIR HFA IN) Inhale 1-2 puffs into the lungs every 6 (six) hours as needed. Reported on 01/05/2016    . metFORMIN (GLUCOPHAGE) 500 MG tablet Take by mouth 2 (two) times daily with a meal. 1/2 tablet daily     No  facility-administered medications prior to visit.    Past Medical History  Diagnosis Date  . Asthma   . Coronary artery disease   . Hypercholesteremia   . Obesity   . Systemic lupus erythematosus ([place])     with nephritis  . Anemia   . Chondromalacia of knee   . Synovitis of knee   . Hypertension   . Pneumonia 07/2008  . Fibroids     h/o  . Internal hemorrhoids     h/o  . Diverticulum 12/11/2002    in cecum  . H/O tinea cruris 02/2010  . History  of ASCVD     s/p CABG  . H/O elevated lipids   . Vulvitis     h/o  . Abnormal finding on Pap smear, ASCUS   . Irregular menses     h/o  . MI (myocardial infarction) ([place]) age 73  . Glucose intolerance (impaired glucose tolerance)   . Diabetes mellitus without complication [facility])     Past Surgical History  Procedure Laterality Date  . Coronary artery bypass graft    . Rotator cuff repair    . Cesarean section    . Tubal ligation    . Upper gastrointestinal endoscopy  12/11/2002  . Endometrial curettage  03/29/2000    Allergies  Allergen Reactions  . Biaxin [Clarithromycin] Itching  . Cardiolite [Technetium-37m] Itching  . Ciprofloxacin Itching  . Erythromycin Base Itching  . Montelukast Sodium Itching  . Niaspan [Niacin Er] Other (See Comments)    flushing  . Penicillins Itching  . Prilosec [Omeprazole] Itching  . Ticlid [Ticlopidine Hcl] Itching    Review of systems negative except as noted in HPI / PMHx or noted below:  Review of Systems  Constitutional: Negative.   HENT: Negative.   Eyes: Negative.   Respiratory: Negative.   Cardiovascular: Negative.   Gastrointestinal: Negative.   Genitourinary: Negative.   Musculoskeletal: Negative.   Skin: Negative.   Neurological: Negative.   Endo/Heme/Allergies: Negative.   Psychiatric/Behavioral: Negative.      Objective:   Filed Vitals:   01/05/16 1741  BP: 112/70  Pulse: 80  Resp: 16          Physical Exam  Constitutional: She is well-developed,  well-nourished, and in no distress.  Slight cough, raspy voice  HENT:  Head: Normocephalic.  Right Ear: Tympanic membrane, external ear and ear canal normal.  Left Ear: Tympanic membrane, external ear and ear canal normal.  Nose: Mucosal edema (Erythematous) present. No rhinorrhea.  Mouth/Throat: Uvula is midline, oropharynx is clear and moist and mucous membranes are normal. No oropharyngeal exudate.  Eyes: Conjunctivae are normal.  Neck: Trachea normal. No tracheal tenderness present. No tracheal deviation present. No thyromegaly present.  Cardiovascular: Normal rate, regular rhythm, S1 normal and S2 normal.   Murmur (systolic ) heard. Pulmonary/Chest: Breath sounds normal. No stridor. No respiratory distress. She has no wheezes. She has no rales.  Musculoskeletal: She exhibits no edema.  Lymphadenopathy:       Head (right side): No tonsillar adenopathy present.       Head (left side): No tonsillar adenopathy present.    She has no cervical adenopathy.    She has no axillary adenopathy.  Neurological: She is alert. Gait normal.  Skin: No rash noted. She is not diaphoretic. No erythema. Nails show no clubbing.  Psychiatric: Mood and affect normal.    Diagnostics:    Spirometry was performed and demonstrated an FEV1 of 2.05 at 101 % of predicted.  The patient had an Asthma Control Test with the following results: ACT Total Score: 15.    Assessment and Plan:   1. Asthma, not well controlled, mild intermittent, with acute exacerbation   2. Allergic rhinoconjunctivitis   3. LPRD (laryngopharyngeal reflux disease)     1. "Action plan" for asthma flare:   A. Qvar 80 3 inhalations 3 times per day with spacer  B. add ranitidine 300 mg in the evening to Protonix 40 mg twice a day  C. use Proventil HFA 2 puffs every 4-6 hours if needed  2. Flonase 1 spray each nostril  twice a day  3. Montelukast 10 mg one tablet one time per day  4. Zyrtec 10 mg one tablet one time per day  5.  If needed:   A. Mucinex DM 2 tablets twice a day  B. May continue Tussionex prescription  C. nasal saline wash several times a day  6. Further evaluation and treatment?  7. Return to clinic in 6 months or earlier if problem   Giavanna will use the therapy described above in an attempt to clear out her respiratory tract inflammation and irritation. I suspect that this was probably viral in nature as the initiating event and now she is just dealing with a lot of the damage left behind. Given her rather significant reflux disease this is probably also contributing to part of this problem as she is probably refluxing into an inflamed airway while she coughs. We'll have her get very aggressive about treating both inflammation and reflux as noted above and she'll keep in contact with me noting her response. If she does well I will see her back in this clinic in 6 months or earlier if there is a problem.  Allena Katz, MD [place]

## 2016-01-05 NOTE — Patient Instructions (Addendum)
  1. "Action plan" for asthma flare:   A. Qvar 80 3 inhalations 3 times per day with spacer  B. add ranitidine 300 mg in the evening to Protonix 40 mg twice a day  C. use Proventil HFA 2 puffs every 4-6 hours if needed  2. Flonase 1 spray each nostril twice a day  3. Montelukast 10 mg one tablet one time per day  4. Zyrtec 10 mg one tablet one time per day  5. If needed:   A. Mucinex DM 2 tablets twice a day  B. May continue Tussionex prescription  C. nasal saline wash several times a day  6. Further evaluation and treatment?  7. Return to clinic in 6 months or earlier if problem

## 2016-01-18 MED FILL — raNITIdine HCL 300 MG TABS: 300 | 30 days supply | Qty: 30 | Fill #0

## 2016-01-29 ENCOUNTER — Encounter: Payer: 59 | Attending: Internal Medicine | Admitting: *Deleted

## 2016-01-29 ENCOUNTER — Encounter: Payer: Self-pay | Admitting: *Deleted

## 2016-01-29 VITALS — Ht 65.0 in | Wt 248.6 lb

## 2016-01-29 DIAGNOSIS — Z713 Dietary counseling and surveillance: Secondary | ICD-10-CM | POA: Diagnosis not present

## 2016-01-29 DIAGNOSIS — E119 Type 2 diabetes mellitus without complications: Secondary | ICD-10-CM

## 2016-01-29 NOTE — Patient Instructions (Signed)
Plan:  Aim for 2 Carb Choices per meal (30 grams) +/- 1 either way  Aim for 0-1 Carbs per snack if hungry  Include protein in moderation with your meals and snacks Consider reading food labels for Total Carbohydrate of foods Continue with your activity level daily as tolerated Consider checking BG at alternate times per day   Continue taking medication as directed by MD

## 2016-01-29 NOTE — Progress Notes (Signed)
Diabetes Self-Management Education  Visit Type: First/Initial  Appt. Start Time: 0800 Appt. End Time: 0930  01/29/2016  [name], identified by name and date of birth, is a 65 y.o. female with a diagnosis of Diabetes: Type 2.   ASSESSMENT  Height 5' 5"  (1.651 m), weight 248 lb 9.6 oz (112.764 kg). Body mass index is 41.37 kg/(m^2).      Diabetes Self-Management Education - 01/29/16 0818    Visit Information   Visit Type First/Initial   Initial Visit   Diabetes Type Type 2   Are you currently following a meal plan? No   Are you taking your medications as prescribed? Yes   Date Diagnosed 3 years ago   Health Coping   How would you rate your overall health? Good   Psychosocial Assessment   Patient Belief/Attitude about Diabetes Motivated to manage diabetes   Self-care barriers None   Self-management support Doctor's office;Church;Friends;CDE visits;Family;Diabetes magazine or newsletters   Other persons present Patient   Patient Concerns Nutrition/Meal planning   Special Needs None   Preferred Learning Style Auditory   Learning Readiness Ready   What is the last grade level you completed in school? some college   Complications   Last HgB A1C per patient/outside source 6.7 %   How often do you check your blood sugar? 3-4 times / week   Fasting Blood glucose range (mg/dL) 70-129   Number of hypoglycemic episodes per month 0   Have you had a dilated eye exam in the past 12 months? Yes   Have you had a dental exam in the past 12 months? Yes   Are you checking your feet? Yes   How many days per week are you checking your feet? 7   Dietary Intake   Breakfast before breakfast at work, will have a shake with kale or other greens, fresh fruit and sometimes OJ, almond milk, Later inAM: bagel with cream cheese, 2 bacon, boiled egg OR yogurt with granola   Snack (morning) occasionally fresh fruit OR yogurt   Lunch left overs OR cafeteria hot meal OR soup and salad or  sandwich   Snack (afternoon) popcorn if anything   Dinner lean meat, starch, vegetables, occasionally bread   Snack (evening) if anythiing, ginger snaps OR sesame seed snacks   Beverage(s) coffee with creamer, water OR 1/2 tea with lemonade,    Exercise   Exercise Type Light (walking / raking leaves)   How many days per week to you exercise? 3   How many minutes per day do you exercise? 30   Total minutes per week of exercise 90   Patient Education   Previous Diabetes Education Yes (please comment)  attended Core Classes and has met with RD and CDE several times   Disease state  Definition of diabetes, type 1 and 2, and the diagnosis of diabetes;Factors that contribute to the development of diabetes   Nutrition management  Role of diet in the treatment of diabetes and the relationship between the three main macronutrients and blood glucose level;Food label reading, portion sizes and measuring food.;Carbohydrate counting   Medications Reviewed patients medication for diabetes, action, purpose, timing of dose and side effects.   Monitoring Identified appropriate SMBG and/or A1C goals.   Psychosocial adjustment Helped patient identify a support system for diabetes management   Individualized Goals (developed by patient)   Nutrition Follow meal plan discussed   Physical Activity 30 minutes per day   Medications take my medication as prescribed  Monitoring  test blood glucose pre and post meals as discussed   Outcomes   Expected Outcomes Demonstrated interest in learning. Expect positive outcomes   Future DMSE PRN   Program Status Completed      Individualized Plan for Diabetes Self-Management Training:   Learning Objective:  Patient will have a greater understanding of diabetes self-management. Patient education plan is to attend individual and/or group sessions per assessed needs and concerns.   Plan:   Patient Instructions  Plan:  Aim for 2 Carb Choices per meal (30 grams) +/- 1  either way  Aim for 0-1 Carbs per snack if hungry  Include protein in moderation with your meals and snacks Consider reading food labels for Total Carbohydrate of foods Continue with your activity level daily as tolerated Consider checking BG at alternate times per day   Continue taking medication as directed by MD      Expected Outcomes:  Demonstrated interest in learning. Expect positive outcomes  Education material provided: Food label handouts, A1C conversion sheet, Meal plan card and Carbohydrate counting sheet, Support Group flyer  If problems or questions, patient to contact team via:  Phone and Email  Future DSME appointment: PRN

## 2016-02-02 ENCOUNTER — Telehealth: Payer: Self-pay

## 2016-02-02 NOTE — Telephone Encounter (Signed)
Patient was last seen on 01/05/16 by Dr. Neldon Mc. She sated she thought Dr. Neldon Mc knew she is allergic to Montelukast and can not take it. Also She stated she has tried 1/2 of fexofenadine because zyrtec isn't working for her anymore. She has been having a lot of coughing,sneezing and asthma flare ups.  [place] patient pharmacy.  Please Advise  Thanks

## 2016-02-02 NOTE — Telephone Encounter (Signed)
Please advise 

## 2016-02-03 ENCOUNTER — Other Ambulatory Visit: Payer: Self-pay

## 2016-02-03 MED ORDER — BUDESONIDE-FORMOTEROL FUMARATE 160-4.5 MCG/ACT IN AERO: 2.0000 | INHALATION_SPRAY | Freq: Two times a day (BID) | RESPIRATORY_TRACT | Status: DC

## 2016-02-03 MED FILL — SYMBICORT 160-4.5 MCG INH: 160-4.5 | 30 days supply | Qty: 10 | Fill #0

## 2016-02-03 NOTE — Telephone Encounter (Signed)
Spoke with pt, informed her of the plan, and sent in symbicort to the pharmacy

## 2016-02-03 NOTE — Telephone Encounter (Signed)
Please inform patient that she can increase zyrtec 10mg  to twice a day, use mucinex DM twice a day, and start Symbicort 160 two inhalations twice a day. Can hold off on Qvar for now while using symbicort. If this does not work will need a CXR and a visit with me in clinic.

## 2016-02-11 MED FILL — ROSUVASTATIN CALCIUM 40 MG: 40 | 90 days supply | Qty: 90 | Fill #2

## 2016-02-11 MED FILL — PANTOPRAZOLE SOD DR 20 MG T: 20 | 90 days supply | Qty: 180 | Fill #1

## 2016-02-11 MED FILL — metFORMIN HCL 500 MG TABS: 500 | 90 days supply | Qty: 90 | Fill #0

## 2016-02-23 DIAGNOSIS — M1990 Unspecified osteoarthritis, unspecified site: Secondary | ICD-10-CM | POA: Diagnosis not present

## 2016-02-23 DIAGNOSIS — M3214 Glomerular disease in systemic lupus erythematosus: Secondary | ICD-10-CM | POA: Diagnosis not present

## 2016-02-23 DIAGNOSIS — E669 Obesity, unspecified: Secondary | ICD-10-CM | POA: Diagnosis not present

## 2016-02-23 DIAGNOSIS — E785 Hyperlipidemia, unspecified: Secondary | ICD-10-CM | POA: Diagnosis not present

## 2016-02-23 DIAGNOSIS — I2510 Atherosclerotic heart disease of native coronary artery without angina pectoris: Secondary | ICD-10-CM | POA: Diagnosis not present

## 2016-02-23 DIAGNOSIS — E119 Type 2 diabetes mellitus without complications: Secondary | ICD-10-CM | POA: Diagnosis not present

## 2016-02-23 DIAGNOSIS — I129 Hypertensive chronic kidney disease with stage 1 through stage 4 chronic kidney disease, or unspecified chronic kidney disease: Secondary | ICD-10-CM | POA: Diagnosis not present

## 2016-02-23 DIAGNOSIS — D649 Anemia, unspecified: Secondary | ICD-10-CM | POA: Diagnosis not present

## 2016-02-23 DIAGNOSIS — Z889 Allergy status to unspecified drugs, medicaments and biological substances status: Secondary | ICD-10-CM | POA: Diagnosis not present

## 2016-03-02 ENCOUNTER — Other Ambulatory Visit: Payer: Self-pay

## 2016-03-02 ENCOUNTER — Telehealth: Payer: Self-pay | Admitting: Allergy and Immunology

## 2016-03-02 NOTE — Telephone Encounter (Signed)
Spoke with patient. She states that her primary care provider would like for her to stop her Protonix secondary to possible long term damage to her kidneys. Patient states that he gave her the OK to continue Zantac. Do you recommend anything different?

## 2016-03-02 NOTE — Telephone Encounter (Signed)
Patient cal

## 2016-03-02 NOTE — Telephone Encounter (Signed)
Please inform patient that she can see how everything goes while using only her ranitidine. Please have her keep in contact with [place] about response to treatment change.

## 2016-03-02 NOTE — Telephone Encounter (Signed)
Pt called and said that her dr wants her to stop the nexium because it can cause some kind damage. Call her [phone].

## 2016-03-22 MED FILL — VITAMIN D 2,000 UNIT SOFTGE: 50 MCG | 100 days supply | Qty: 100 | Fill #3

## 2016-03-22 MED FILL — raNITIdine HCL 300 MG TABS: 300 | 30 days supply | Qty: 30 | Fill #1

## 2016-03-22 MED FILL — CERTAVITE-ANTIOXIDANT TAB: 130 days supply | Qty: 130 | Fill #1

## 2016-03-23 MED FILL — MAGNESIUM OXIDE 500 MG TAB: 500 | 100 days supply | Qty: 100 | Fill #0

## 2016-03-29 ENCOUNTER — Ambulatory Visit (INDEPENDENT_AMBULATORY_CARE_PROVIDER_SITE_OTHER): Payer: 59 | Admitting: Allergy and Immunology

## 2016-03-29 ENCOUNTER — Encounter: Payer: Self-pay | Admitting: Allergy and Immunology

## 2016-03-29 VITALS — BP 114/60 | HR 76 | Resp 18

## 2016-03-29 DIAGNOSIS — J309 Allergic rhinitis, unspecified: Secondary | ICD-10-CM

## 2016-03-29 DIAGNOSIS — J4540 Moderate persistent asthma, uncomplicated: Secondary | ICD-10-CM

## 2016-03-29 DIAGNOSIS — J387 Other diseases of larynx: Secondary | ICD-10-CM | POA: Diagnosis not present

## 2016-03-29 DIAGNOSIS — K219 Gastro-esophageal reflux disease without esophagitis: Secondary | ICD-10-CM

## 2016-03-29 DIAGNOSIS — H1010 Acute atopic conjunctivitis, unspecified eye: Secondary | ICD-10-CM | POA: Diagnosis not present

## 2016-03-29 NOTE — Patient Instructions (Addendum)
  1. Symbicort 160 two inhalations one time per day  2. "Action plan" for asthma flare:   A. Qvar 80 3 inhalations 3 times per day with spacer  B. use Proventil HFA 2 puffs every 4-6 hours if needed  C. Increase Symbicort to two inhalations two times per day  3. Flonase 1 spray each nostril one -two times a day  4. Zyrtec 10 mg one tablet one time per day if needed  5. Ranitidine 300 mg one tablet in evening. Can try to add an additional 150 or 300 mg in morning to see if this helps with reflux  6. Decrease caffeine consumption: aim for none  7. Further evaluation and treatment?  8. Return to clinic in 3 months or earlier if problem

## 2016-03-29 NOTE — Progress Notes (Signed)
Follow-up Note  Referring Provider: Josetta Huddle, MD Primary Provider: Henrine Screws, MD Date of Office Visit: 03/29/2016  Subjective:   Deanna Simmons (DOB: 06-15-1951) is a 65 y.o. female who returns to the [place] on 03/29/2016 in re-evaluation of the following:  HPI: Sia returns to this clinic in evaluation of her asthma and allergic rhinitis and LPR.   This spring we changed her from Qvar to Symbicort because she continued to have significant problems with wheezing and coughing and was using her bronchodilator commonly. She is doing much better at this point in time while utilizing this medication and rarely uses a short acting bronchodilator at this point. However it should be noted that she always tapers off her controller agent for asthma once she starts feeling well better. This has been a repetitive issue concerning these agents.  Her reflux been active. Her kidney doctor who she sees one time per year told her to stop pantoprazole because of the risk of kidney damage. It does not sound as though she has any kidney damage and it sounds as though she sees her nephrologist as a remnant of seeing his practice for primary care a long time ago. Since she has stopped this medication she has had very significant reflux with regurgitation on a common basis while only using ranitidine. She questions whether or not she can increase her ranitidine. She does continue to drink about 20 ounces of coffee a day. Fortunately, she is not been having a tremendous amount of throat problems associated with her reflux while using her ranitidine.  Her nose is been doing quite well while intermittently using Flonase. She's not had any episodes of sinusitis.    Medication List           aspirin 81 MG EC tablet  Take 81 mg by mouth daily. Swallow whole.     budesonide-formoterol 160-4.5 MCG/ACT inhaler  Commonly known as:  SYMBICORT  Inhale 2 puffs into the lungs 2  (two) times daily.     cetirizine 10 MG tablet  Commonly known as:  ZYRTEC  Take 10 mg by mouth daily.     chlorpheniramine-HYDROcodone 10-8 MG/5ML Suer  Commonly known as:  TUSSIONEX     ezetimibe 10 MG tablet  Commonly known as:  ZETIA  Take 1 tablet (10 mg total) by mouth daily.     fluconazole 150 MG tablet  Commonly known as:  DIFLUCAN     fluticasone 50 MCG/ACT nasal spray  Commonly known as:  FLONASE  Place 2 sprays into the nose daily as needed. Nasal congestion     Magnesium Oxide 500 MG Tabs     metFORMIN 500 MG tablet  Commonly known as:  GLUCOPHAGE  Take 250 mg by mouth daily.     metoprolol tartrate 25 MG tablet  Commonly known as:  LOPRESSOR  Take 1 tablet (25 mg total) by mouth 2 (two) times daily.     [place] DM PO  Take by mouth as needed. Reported on 03/29/2016     MULTIVITAL PO  Take 1 tablet by mouth daily.     nitroGLYCERIN 0.4 MG SL tablet  Commonly known as:  NITROSTAT  Place 0.4 mg under the tongue every 5 (five) minutes as needed for chest pain (3 doses MAX). Reported on 12/01/2015     pantoprazole 40 MG tablet  Commonly known as:  PROTONIX  TAKE 1 TABLET BY MOUTH ONCE DAILY FOR REFLUX  PROBIOTIC DAILY PO  Take by mouth daily.     ranitidine 300 MG tablet  Commonly known as:  ZANTAC  Take one tablet each evening during flare up as directed     rosuvastatin 40 MG tablet  Commonly known as:  CRESTOR  Take 1 tablet (40 mg total) by mouth daily.     sodium chloride 0.65 % Soln nasal spray  Commonly known as:  OCEAN  Place 1 spray into both nostrils as needed for congestion.     TRUE METRIX BLOOD GLUCOSE TEST test strip  Generic drug:  glucose blood     TRUEPLUS LANCETS 30G Misc     valACYclovir 500 MG tablet  Commonly known as:  VALTREX  Take 500 mg by mouth daily as needed (cold sores/blisters). Reported on 12/01/2015     VENTOLIN HFA 108 (90 Base) MCG/ACT inhaler  Generic drug:  albuterol  Inhale 2 puffs into the lungs every  6 (six) hours as needed for wheezing or shortness of breath.     Vitamin D3 2000 units Tabs  Take 1 tablet by mouth daily.        Past Medical History  Diagnosis Date  . Asthma   . Coronary artery disease   . Hypercholesteremia   . Obesity   . Systemic lupus erythematosus ([place])     with nephritis  . Anemia   . Chondromalacia of knee   . Synovitis of knee   . Hypertension   . Pneumonia 07/2008  . Fibroids     h/o  . Internal hemorrhoids     h/o  . Diverticulum 12/11/2002    in cecum  . H/O tinea cruris 02/2010  . History of ASCVD     s/p CABG  . H/O elevated lipids   . Vulvitis     h/o  . Abnormal finding on Pap smear, ASCUS   . Irregular menses     h/o  . MI (myocardial infarction) ([place]) age 27  . Glucose intolerance (impaired glucose tolerance)   . Diabetes mellitus without complication [facility])     Past Surgical History  Procedure Laterality Date  . Coronary artery bypass graft    . Rotator cuff repair    . Cesarean section    . Tubal ligation    . Upper gastrointestinal endoscopy  12/11/2002  . Endometrial curettage  03/29/2000    Allergies  Allergen Reactions  . Biaxin [Clarithromycin] Itching  . Cardiolite [Technetium-25m] Itching  . Ciprofloxacin Itching  . Erythromycin Base Itching  . Montelukast Sodium Itching  . Niaspan [Niacin Er] Other (See Comments)    flushing  . Penicillins Itching  . Prilosec [Omeprazole] Itching  . Ticlid [Ticlopidine Hcl] Itching    Review of systems negative except as noted in HPI / PMHx or noted below:  Review of Systems  Constitutional: Negative.   HENT: Negative.   Eyes: Negative.   Respiratory: Negative.   Cardiovascular: Negative.   Gastrointestinal: Negative.   Genitourinary: Negative.   Musculoskeletal: Negative.   Skin: Negative.   Neurological: Negative.   Endo/Heme/Allergies: Negative.   Psychiatric/Behavioral: Negative.      Objective:   Filed Vitals:   03/29/16 1754  BP: 114/60  Pulse: 76    Resp: 18          Physical Exam  Constitutional: She is well-developed, well-nourished, and in no distress.  HENT:  Head: Normocephalic.  Right Ear: Tympanic membrane, external ear and ear canal normal.  Left Ear: Tympanic membrane, external  ear and ear canal normal.  Nose: Nose normal. No mucosal edema or rhinorrhea.  Mouth/Throat: Uvula is midline, oropharynx is clear and moist and mucous membranes are normal. No oropharyngeal exudate.  Eyes: Conjunctivae are normal.  Neck: Trachea normal. No tracheal tenderness present. No tracheal deviation present. No thyromegaly present.  Cardiovascular: Normal rate, regular rhythm, S1 normal, S2 normal and normal heart sounds.   No murmur heard. Pulmonary/Chest: Breath sounds normal. No stridor. No respiratory distress. She has no wheezes. She has no rales.  Musculoskeletal: She exhibits no edema.  Lymphadenopathy:       Head (right side): No tonsillar adenopathy present.       Head (left side): No tonsillar adenopathy present.    She has no cervical adenopathy.  Neurological: She is alert. Gait normal.  Skin: No rash noted. She is not diaphoretic. No erythema. Nails show no clubbing.  Psychiatric: Mood and affect normal.    Diagnostics:    Spirometry was performed and demonstrated an FEV1 of 1.79 at 88 % of predicted.  The patient had an Asthma Control Test with the following results: ACT Total Score: 18.    Assessment and Plan:   1. Asthma, moderate persistent, well-controlled   2. Allergic rhinoconjunctivitis   3. LPRD (laryngopharyngeal reflux disease)     1. Symbicort 160 two inhalations one time per day  2. "Action plan" for asthma flare:   A. Qvar 80 3 inhalations 3 times per day with spacer  B. use Proventil HFA 2 puffs every 4-6 hours if needed  C. Increase Symbicort to two inhalations two times per day  3. Flonase 1 spray each nostril one -two times a day  4. Zyrtec 10 mg one tablet one time per day if  needed  5. Ranitidine 300 mg one tablet in evening. Can try to add an additional 150 or 300 mg in morning to see if this helps with reflux  6. Decrease caffeine consumption: aim for none  7. Further evaluation and treatment?  8. Return to clinic in 3 months or earlier if problem  Brielyn appears to have very significant reflux and I don't know if she's going to do well on just ranitidine. I did recommend that she decrease and try to taper off caffeine and she can certainly try to increase her ranitidine doses as mentioned above. I think she would do better to use Symbicort on a regular basis rather than intermittently to prevent her from developing significant problems with her lungs and of course she can always use Flonase during periods in time in which she is developing significant upper airway symptoms. To make things as convenient as possible for her and to hopefully alleviate her fear of using medications on a regular basis i will try to just give her Symbicort one time per day as a preventative agent. I did give her an action plan to initiate should she develop significant asthma flare which would include the addition of Qvar to Symbicort. We'll see up things go over the course of the next 3 months. I'll see her back in his clinic at that point in time or earlier if there is a problem.  Allena Katz, MD [place]

## 2016-03-31 ENCOUNTER — Other Ambulatory Visit: Payer: Self-pay | Admitting: Allergy and Immunology

## 2016-03-31 MED FILL — QVAR 80 MCG ORAL INHALER: 80 | 30 days supply | Qty: 9 | Fill #0

## 2016-03-31 MED FILL — VENTOLIN HFA 90 MCG INHALER: 108 (90 BAS | 16 days supply | Qty: 18 | Fill #0

## 2016-03-31 MED FILL — SYMBICORT 160-4.5 MCG INH: 160-4.5 | 30 days supply | Qty: 10 | Fill #1

## 2016-04-14 DIAGNOSIS — R109 Unspecified abdominal pain: Secondary | ICD-10-CM | POA: Diagnosis not present

## 2016-04-14 DIAGNOSIS — K219 Gastro-esophageal reflux disease without esophagitis: Secondary | ICD-10-CM | POA: Diagnosis not present

## 2016-04-15 MED FILL — raNITIdine HCL 300 MG TABS: 300 | 90 days supply | Qty: 90 | Fill #2

## 2016-04-19 DIAGNOSIS — D509 Iron deficiency anemia, unspecified: Secondary | ICD-10-CM | POA: Diagnosis not present

## 2016-04-19 DIAGNOSIS — E559 Vitamin D deficiency, unspecified: Secondary | ICD-10-CM | POA: Diagnosis not present

## 2016-04-19 DIAGNOSIS — I2510 Atherosclerotic heart disease of native coronary artery without angina pectoris: Secondary | ICD-10-CM | POA: Diagnosis not present

## 2016-04-19 DIAGNOSIS — Z7984 Long term (current) use of oral hypoglycemic drugs: Secondary | ICD-10-CM | POA: Diagnosis not present

## 2016-04-19 DIAGNOSIS — E785 Hyperlipidemia, unspecified: Secondary | ICD-10-CM | POA: Diagnosis not present

## 2016-04-19 DIAGNOSIS — E119 Type 2 diabetes mellitus without complications: Secondary | ICD-10-CM | POA: Diagnosis not present

## 2016-04-19 DIAGNOSIS — Z23 Encounter for immunization: Secondary | ICD-10-CM | POA: Diagnosis not present

## 2016-04-19 DIAGNOSIS — Z6841 Body Mass Index (BMI) 40.0 and over, adult: Secondary | ICD-10-CM | POA: Diagnosis not present

## 2016-04-22 ENCOUNTER — Other Ambulatory Visit: Payer: Self-pay | Admitting: Gastroenterology

## 2016-05-04 MED FILL — EZETIMIBE 10 MG TABLET: 10 | 90 days supply | Qty: 90 | Fill #5

## 2016-05-04 MED FILL — METOPROLOL TARTRATE 25 MG T: 25 | 30 days supply | Qty: 60 | Fill #4

## 2016-05-04 MED FILL — MAGNESIUM OXIDE 500 MG TAB: 500 | 80 days supply | Qty: 80 | Fill #1

## 2016-05-04 MED FILL — metFORMIN HCL 500 MG TABS: 500 | 90 days supply | Qty: 90 | Fill #1

## 2016-05-05 MED FILL — ROSUVASTATIN CALCIUM 40 MG: 40 | 90 days supply | Qty: 90 | Fill #3

## 2016-06-01 ENCOUNTER — Other Ambulatory Visit: Payer: Self-pay | Admitting: Interventional Cardiology

## 2016-06-01 MED FILL — METOPROLOL TARTRATE 25 MG T: 25 | 90 days supply | Qty: 180 | Fill #0

## 2016-06-16 ENCOUNTER — Ambulatory Visit: Payer: Self-pay | Admitting: *Deleted

## 2016-06-16 ENCOUNTER — Other Ambulatory Visit: Payer: Self-pay | Admitting: Obstetrics and Gynecology

## 2016-06-16 DIAGNOSIS — R921 Mammographic calcification found on diagnostic imaging of breast: Secondary | ICD-10-CM

## 2016-06-21 ENCOUNTER — Other Ambulatory Visit: Payer: Self-pay | Admitting: *Deleted

## 2016-06-22 ENCOUNTER — Ambulatory Visit (INDEPENDENT_AMBULATORY_CARE_PROVIDER_SITE_OTHER): Payer: 59 | Admitting: Family Medicine

## 2016-06-22 ENCOUNTER — Encounter: Payer: Self-pay | Admitting: Family Medicine

## 2016-06-22 ENCOUNTER — Ambulatory Visit (INDEPENDENT_AMBULATORY_CARE_PROVIDER_SITE_OTHER): Payer: 59

## 2016-06-22 ENCOUNTER — Encounter: Payer: Self-pay | Admitting: *Deleted

## 2016-06-22 VITALS — BP 120/74 | HR 82 | Temp 97.9°F | Resp 17 | Ht 65.0 in | Wt 253.0 lb

## 2016-06-22 DIAGNOSIS — M25571 Pain in right ankle and joints of right foot: Secondary | ICD-10-CM | POA: Diagnosis not present

## 2016-06-22 DIAGNOSIS — M7989 Other specified soft tissue disorders: Secondary | ICD-10-CM | POA: Diagnosis not present

## 2016-06-22 DIAGNOSIS — S99921A Unspecified injury of right foot, initial encounter: Secondary | ICD-10-CM | POA: Diagnosis not present

## 2016-06-22 MED ORDER — TRAMADOL HCL 50 MG PO TABS: 50.0000 mg | ORAL_TABLET | Freq: Three times a day (TID) | ORAL | 0 refills | Status: DC | PRN

## 2016-06-22 NOTE — Patient Instructions (Addendum)
  Your xray looks good.  Use the Tramadol for pain relief as needed.  You can take this every 6 - 8 hours if you need to.  Use ice as well for relief.  You should feel much better over the next several days.  If not, come back and see [place]   IF you received an x-ray today, you will receive an invoice from [facility]. Please contact [facility] at [phone] with questions or concerns regarding your invoice.   IF you received labwork today, you will receive an invoice from [employer]. Please contact [facility] at [phone] with questions or concerns regarding your invoice.   Our billing staff will not be able to assist you with questions regarding bills from these companies.  You will be contacted with the lab results as soon as they are available. The fastest way to get your results is to activate your My Chart account. Instructions are located on the last page of this paperwork. If you have not heard from [place] regarding the results in 2 weeks, please contact this office.

## 2016-06-22 NOTE — Progress Notes (Signed)
Deanna Simmons is a 65 y.o. female who presents to Urgent Care today for Right ankle pain:  1.  Right ankle pain:  Started on Monday when she dropped a pallet of about 40 water bottles onto her Right ankle.  Immediate pain. Noted bruising that started soon after.  She's been taking some infrequent OTC analgesics without much pain.  Has not needed a brace or crutches or other support to walk.    ROS as above.    PMH reviewed. Patient is a nonsmoker.   Past Medical History:  Diagnosis Date  . Abnormal finding on Pap smear, ASCUS   . Anemia   . Asthma   . Chondromalacia of knee   . Coronary artery disease   . Diabetes mellitus without complication ([place])   . Diverticulum 12/11/2002   in cecum  . Fibroids    h/o  . Glucose intolerance (impaired glucose tolerance)   . H/O elevated lipids   . H/O tinea cruris 02/2010  . History of ASCVD    s/p CABG  . Hypercholesteremia   . Hypertension   . Internal hemorrhoids    h/o  . Irregular menses    h/o  . MI (myocardial infarction) ([place]) age 9  . Obesity   . Pneumonia 07/2008  . Synovitis of knee   . Systemic lupus erythematosus ([place])    with nephritis  . Vulvitis    h/o   Past Surgical History:  Procedure Laterality Date  . CESAREAN SECTION    . CORONARY ARTERY BYPASS GRAFT    . Endometrial curettage  03/29/2000  . ROTATOR CUFF REPAIR    . TUBAL LIGATION    . UPPER GASTROINTESTINAL ENDOSCOPY  12/11/2002    Medications reviewed. Current Outpatient Prescriptions  Medication Sig Dispense Refill  . aspirin 81 MG EC tablet Take 81 mg by mouth daily. Swallow whole.    . budesonide-formoterol (SYMBICORT) 160-4.5 MCG/ACT inhaler Inhale 2 puffs into the lungs 2 (two) times daily. 10.2 g 5  . cetirizine (ZYRTEC) 10 MG tablet Take 10 mg by mouth daily.    . chlorpheniramine-HYDROcodone (TUSSIONEX) 10-8 MG/5ML SUER   0  . Cholecalciferol (VITAMIN D3) 2000 UNITS TABS Take 1 tablet by mouth daily.    Marland Kitchen Dextromethorphan-Guaifenesin (MUCINEX  DM PO) Take by mouth as needed. Reported on 03/29/2016    . ezetimibe (ZETIA) 10 MG tablet Take 1 tablet (10 mg total) by mouth daily. 30 tablet 11  . fluconazole (DIFLUCAN) 150 MG tablet   2  . fluticasone (FLONASE) 50 MCG/ACT nasal spray Place 2 sprays into the nose daily as needed. Nasal congestion    . Magnesium Oxide 500 MG TABS   3  . metFORMIN (GLUCOPHAGE) 500 MG tablet Take 250 mg by mouth daily.    . metoprolol tartrate (LOPRESSOR) 25 MG tablet TAKE 1 TABLET BY MOUTH TWICE A DAY 180 tablet 0  . Multiple Vitamins-Minerals (MULTIVITAL PO) Take 1 tablet by mouth daily.    . nitroGLYCERIN (NITROSTAT) 0.4 MG SL tablet Place 0.4 mg under the tongue every 5 (five) minutes as needed for chest pain (3 doses MAX). Reported on 12/01/2015    . Probiotic Product (PROBIOTIC DAILY PO) Take by mouth daily.    Marland Kitchen QVAR 80 MCG/ACT inhaler INHALE 2 PUFFS DAILY TO PREVENT COUGH/WHEEZE. RINSE, GARGLE, AND SPIT AFTER USE (3 PUFFS 3 X DAILY WITH FLARE UPS) 26.1 g 5  . ranitidine (ZANTAC) 300 MG tablet Take one tablet each evening during flare up as directed  30 tablet 5  . rosuvastatin (CRESTOR) 40 MG tablet Take 1 tablet (40 mg total) by mouth daily. 90 tablet 3  . sodium chloride (OCEAN) 0.65 % SOLN nasal spray Place 1 spray into both nostrils as needed for congestion.    . TRUE METRIX BLOOD GLUCOSE TEST test strip   3  . TRUEPLUS LANCETS 30G MISC   3  . valACYclovir (VALTREX) 500 MG tablet Take 500 mg by mouth daily as needed (cold sores/blisters). Reported on 12/01/2015    . VENTOLIN HFA 108 (90 Base) MCG/ACT inhaler INHALE 2 PUFFS BY MOUTH EVERY 4-6 HOURS AS NEEDED FOR COUGH OR WHEEZE 18 g 1  . pantoprazole (PROTONIX) 40 MG tablet TAKE 1 TABLET BY MOUTH ONCE DAILY FOR REFLUX (Patient not taking: Reported on 03/29/2016) 90 tablet 0   No current facility-administered medications for this visit.      Physical Exam:  BP 120/74 (BP Location: Right Arm, Patient Position: Sitting, Cuff Size: Large)   Pulse 82    Temp 97.9 F (36.6 C) (Oral)   Resp 17   Ht 5\' 5"  (1.651 m)   Wt 253 lb (114.8 kg)   SpO2 98%   BMI 42.10 kg/m  Gen:  Alert, cooperative patient who appears stated age in no acute distress.  Vital signs reviewed. HEENT: EOMI,  MMM Pulm:  Clear to auscultation bilaterally with good air movement.  No wheezes or rales noted.   Cardiac:  Regular rate and rhythm  Exts: Non edematous BL  LE, warm and well perfused.  MSK:  Has area of bruising that's about 2.5 cm in diameter noted about 1 cm superior to medial malleolus on the Right.  TTP here.  Starting to lighten in color.  Also with dependent bruising noted inferior to medial malleolus.  Non tender throughout ankle except where the area of impact was above the malleolus.  No joint laxity of ankle.  No syndesmotic pain.  Drawer test negative.   Assessment and Plan:  1.  Bruising and ankle pain: - secondary to impact from water bottles falling on her ankle - negative xrays, no fracture.  Only moderate level of tenderness at bruising, which corresponds with negative fracture. - She is able to ambulate well around the room and up and down the hall without limping/antalgic gait.  - Plan to treat with analgesia.  Ice.  It is healing already.  Ankle is strong and nontender, no evidence of ankle sprain.  - FU with [place] if no improvement.  FU sooner if worsening.

## 2016-06-22 NOTE — Patient Outreach (Signed)
Deanna Simmons) Care Management   06/21/2016  Deanna Simmons January 16, 1951 [id number]  Deanna Simmons is an 65 y.o. female who presents to the [place] [facility] office for routine Link To Wellness follow up for self management assistance with Type II DM, HTN, hyperlipidemia and morbid obesity.  Subjective: Deanna Simmons says she enrolled in the Diabetes Prevention Program being offered by [facility] with weekly 45 minute classes held at [facility]. She says she's disappointed that she has gained weight despite being an active participate in the program. She says she has been told by her orthopedist that she needs left total knee replacement due to a work related injury years ago but she has not decided if she will pursue the surgery as she has concerns related to her comorbidities.  She says she dropped a case of water on her right ankle and is having some pain along with bruising. She says if it is not better by tomorrow , she will see a MD. She says she saw Dr. Jimmy Footman for her chronic kidney disease and she was told to stop Protonix due to side effect of potential worsening kidney damage.  She also says she was told her lupus is in remission. She continues to monitor her blood pressure and blood sugar at home.  Objective:   Review of Systems  Constitutional: Negative.     Physical Exam  Constitutional: She is oriented to person, place, and time. She appears well-developed and well-nourished.  Respiratory: Effort normal.  Neurological: She is alert and oriented to person, place, and time.  Psychiatric: She has a normal mood and affect. Her behavior is normal. Judgment and thought content normal.   Filed Weights   06/21/16 1002  Weight: 250 lb 6.4 oz (113.6 kg)   Vitals:   06/21/16 1002  BP: 110/88   Feet examined, moderate size bruise on right lower extremity, above right foot, inner aspect of leg. Bilateral pedal  pulses.  Encounter Medications:   Outpatient Encounter Prescriptions as of 06/21/2016  Medication Sig Note  . aspirin 81 MG EC tablet Take 81 mg by mouth daily. Swallow whole.   . budesonide-formoterol (SYMBICORT) 160-4.5 MCG/ACT inhaler Inhale 2 puffs into the lungs 2 (two) times daily. 06/21/2016: Takes as needed , usually needs on very hot days and at season change  . cetirizine (ZYRTEC) 10 MG tablet Take 10 mg by mouth daily.   . Cholecalciferol (VITAMIN D3) 2000 UNITS TABS Take 1 tablet by mouth daily.   Marland Kitchen ezetimibe (ZETIA) 10 MG tablet Take 1 tablet (10 mg total) by mouth daily.   . fluconazole (DIFLUCAN) 150 MG tablet  06/21/2016: Takes for yeast infection after antibiotic use  . fluticasone (FLONASE) 50 MCG/ACT nasal spray Place 2 sprays into the nose daily as needed. Nasal congestion 06/21/2016: Takes prn  . Magnesium Oxide 500 MG TABS  06/21/2016: For leg cramps at night  . metFORMIN (GLUCOPHAGE) 500 MG tablet Take 250 mg by mouth daily. 06/21/2016: Takes one 500 mg tablet daily  . metoprolol tartrate (LOPRESSOR) 25 MG tablet TAKE 1 TABLET BY MOUTH TWICE A DAY   . Multiple Vitamins-Minerals (MULTIVITAL PO) Take 1 tablet by mouth daily.   . Probiotic Product (PROBIOTIC DAILY PO) Take by mouth daily.   Marland Kitchen QVAR 80 MCG/ACT inhaler INHALE 2 PUFFS DAILY TO PREVENT COUGH/WHEEZE. RINSE, GARGLE, AND SPIT AFTER USE (3 PUFFS 3 X DAILY WITH FLARE UPS) 06/21/2016: uses as needed  . ranitidine (ZANTAC) 300  MG tablet Take one tablet each evening during flare up as directed 06/21/2016: 150 mg tablet twice daily  . rosuvastatin (CRESTOR) 40 MG tablet Take 1 tablet (40 mg total) by mouth daily.   . sodium chloride (OCEAN) 0.65 % SOLN nasal spray Place 1 spray into both nostrils as needed for congestion. 06/21/2016: Uses prn  . TRUE METRIX BLOOD GLUCOSE TEST test strip  01/05/2016: Received from: External Pharmacy  . TRUEPLUS LANCETS 30G MISC  01/05/2016: Received from: External Pharmacy  . valACYclovir (VALTREX)  500 MG tablet Take 500 mg by mouth daily as needed (cold sores/blisters). Reported on 12/01/2015   . VENTOLIN HFA 108 (90 Base) MCG/ACT inhaler INHALE 2 PUFFS BY MOUTH EVERY 4-6 HOURS AS NEEDED FOR COUGH OR WHEEZE 06/21/2016: Uses prn  . chlorpheniramine-HYDROcodone (TUSSIONEX) 10-8 MG/5ML SUER  01/05/2016: Received from: External Pharmacy  . Dextromethorphan-Guaifenesin (MUCINEX DM PO) Take by mouth as needed. Reported on 03/29/2016   . nitroGLYCERIN (NITROSTAT) 0.4 MG SL tablet Place 0.4 mg under the tongue every 5 (five) minutes as needed for chest pain (3 doses MAX). Reported on 12/01/2015    No facility-administered encounter medications on file as of 06/21/2016.     Functional Status:   In your present state of health, do you have any difficulty performing the following activities: 12/01/2015 12/01/2015  Hearing? N N  Vision? N N  Difficulty concentrating or making decisions? N N  Walking or climbing stairs? N N  Dressing or bathing? N N  Doing errands, shopping? N N  Some recent data might be hidden    Fall/Depression Screening:    PHQ 2/9 Scores 06/22/2016 01/29/2016 12/01/2015 06/02/2015 04/05/2014  PHQ - 2 Score 0 0 0 0 0    Assessment:  [employer] employee with Type II DM, HTN, hyperlipidemia and morbid obesity. Meeting treatment targets for DM and HTN.  Plan:  [facility] CM Care Plan Problem One   Flowsheet Row Most Recent Value  Care Plan Problem One Type II DM with good glycemic control as evidenced by 99% of home CBG readings meeting target and A1C= 6.6% on 04/19/16, HTN meeting treatment targets, hyperlipidemia with LDL= 100 and triglycerides= 183 on 04/19/16, morbid obesity with 2 lbs weight gain recently  Role Documenting the Problem One  Care Management Coordinator  Care Plan for Problem One  Active  [facility] Long Term Goal (31-90 days)  Ongoing good glycemic control as evidenced by meeting A1C target of <7.0% and 75% of home CBG reading meeting target and ongoing good control of HTN, improved  hyperlipidemia as evidenced by normal lipid profile at next check, ongoing good control of HTN as evidenced by BP readings <140/<90, morbid obesity with evidence of weight loss or no weight gain at each assessment  Interventions for Problem One Long Term Goal  reviewed basic pathophysiology of Type II DM, reviewed changes to health history, reviewed CBG meter history, reviewed DM medication of Metformin and mechanism of action , reviewed home BP readings and reviewed treatment targets, reviewed all labs drawn at MD office visit on 6/20 and provided handout on lipid management and discussed strategies to  improve LDL and triglycerides, discussed 2018 program changes, since Journie is participating in the Diabetes Prevention Program, will arrange Link To Wellness follow up in 2018     RNCM to fax today's office visit note to Dr. Inda Merlin. RNCM will meet as needed, since Ayako is participating in the Diabetes Prevention Program until May 2018, with patient per [facility] To Wellness program  guidelines to assist with Type II DM, HTN, hyperlipidemia and morbid obesity self-management and assess patient's progress toward mutually set goals.   Barrington Ellison RN,CCM,CDE [place] Management Coordinator Link To Wellness Office Phone [phone] Office Fax [phone]

## 2016-06-23 ENCOUNTER — Telehealth: Payer: Self-pay | Admitting: Interventional Cardiology

## 2016-06-23 ENCOUNTER — Other Ambulatory Visit: Payer: Self-pay | Admitting: Interventional Cardiology

## 2016-06-23 ENCOUNTER — Ambulatory Visit (HOSPITAL_COMMUNITY)
Admission: RE | Admit: 2016-06-23 | Discharge: 2016-06-23 | Disposition: A | Discharge status: Home / Self Care | Payer: 59 | Source: Ambulatory Visit | Attending: Cardiology | Admitting: Cardiology

## 2016-06-23 DIAGNOSIS — H3589 Other specified retinal disorders: Secondary | ICD-10-CM | POA: Diagnosis not present

## 2016-06-23 DIAGNOSIS — H3509 Other intraretinal microvascular abnormalities: Secondary | ICD-10-CM

## 2016-06-23 DIAGNOSIS — H34211 Partial retinal artery occlusion, right eye: Secondary | ICD-10-CM | POA: Diagnosis not present

## 2016-06-23 DIAGNOSIS — I6523 Occlusion and stenosis of bilateral carotid arteries: Secondary | ICD-10-CM | POA: Insufficient documentation

## 2016-06-23 DIAGNOSIS — E119 Type 2 diabetes mellitus without complications: Secondary | ICD-10-CM | POA: Diagnosis not present

## 2016-06-23 DIAGNOSIS — I2510 Atherosclerotic heart disease of native coronary artery without angina pectoris: Secondary | ICD-10-CM | POA: Insufficient documentation

## 2016-06-23 DIAGNOSIS — I10 Essential (primary) hypertension: Secondary | ICD-10-CM | POA: Insufficient documentation

## 2016-06-23 DIAGNOSIS — E7800 Pure hypercholesterolemia, unspecified: Secondary | ICD-10-CM | POA: Diagnosis not present

## 2016-06-23 DIAGNOSIS — H579 Unspecified disorder of eye and adnexa: Secondary | ICD-10-CM

## 2016-06-23 DIAGNOSIS — H34219 Partial retinal artery occlusion, unspecified eye: Secondary | ICD-10-CM

## 2016-06-23 DIAGNOSIS — R0989 Other specified symptoms and signs involving the circulatory and respiratory systems: Secondary | ICD-10-CM

## 2016-06-23 NOTE — Telephone Encounter (Signed)
New Message  Jeani Hawking from [facility] Call to request an order for a STAT Carotid to be preformed for the findings of Vascular Occlusion in eye. Please call back to discuss

## 2016-06-23 NOTE — Telephone Encounter (Signed)
Returned call to Lennon at [employer] office.She stated patient was in today with blurred vision in rt eye.Dr.Gould requesting carotid dopplers to be done ASAP for vascular occulusion rt eye.Carotid dopplers scheduled today at 4:15 pm at [facility] office.Jeani Hawking requested report to be faxed to Dr.Sigmund Delman Cheadle at fax # [phone].

## 2016-06-24 DIAGNOSIS — H34211 Partial retinal artery occlusion, right eye: Secondary | ICD-10-CM | POA: Diagnosis not present

## 2016-06-24 NOTE — Telephone Encounter (Signed)
Copy of carotid results faxed to Dr.Gould's office to the fax # listed below

## 2016-06-29 ENCOUNTER — Telehealth: Payer: Self-pay | Admitting: Interventional Cardiology

## 2016-06-29 DIAGNOSIS — H34211 Partial retinal artery occlusion, right eye: Secondary | ICD-10-CM | POA: Diagnosis not present

## 2016-06-29 MED ORDER — ASPIRIN EC 325 MG PO TBEC: 325.0000 mg | DELAYED_RELEASE_TABLET | Freq: Every day | ORAL | 0 refills | Status: DC

## 2016-06-29 NOTE — Telephone Encounter (Signed)
-----   Message from Belva Crome, MD sent at 06/26/2016  7:17 PM EDT ----- Less than 40% obstruction noted bilaterally.

## 2016-06-29 NOTE — Telephone Encounter (Signed)
New Message:    Pt would like to know if her doppler results are back from last week please?

## 2016-06-29 NOTE — Telephone Encounter (Signed)
Spoke with pt. Pt aware of Carotid results with verbal understanding. Pt wanted to make Dr.Smith aware that she is being treated by Dr.Gould in Opthalmology for a vascular occulusion rt eye, this is the reason why her Carotid dopp was ordered to be repeated. Pt sts that she was given eye drops, adv to increase Asa to 325mg  daily. Adv pt that I will fwd the update to Dr.Smith and call back if he has anything to add. Pt agreeable Pt sts that she is due to see Dr.Smith in October 2017. appt sch for 10/3 @ 9am  Pt voiced appreciation for the assistance.

## 2016-07-07 DIAGNOSIS — N644 Mastodynia: Secondary | ICD-10-CM | POA: Diagnosis not present

## 2016-07-11 ENCOUNTER — Other Ambulatory Visit: Payer: Self-pay | Admitting: Obstetrics and Gynecology

## 2016-07-11 DIAGNOSIS — R921 Mammographic calcification found on diagnostic imaging of breast: Secondary | ICD-10-CM

## 2016-07-11 DIAGNOSIS — N644 Mastodynia: Secondary | ICD-10-CM

## 2016-07-12 DIAGNOSIS — H539 Unspecified visual disturbance: Secondary | ICD-10-CM | POA: Diagnosis not present

## 2016-07-13 DIAGNOSIS — H34211 Partial retinal artery occlusion, right eye: Secondary | ICD-10-CM | POA: Diagnosis not present

## 2016-07-14 ENCOUNTER — Ambulatory Visit
Admission: RE | Admit: 2016-07-14 | Discharge: 2016-07-14 | Disposition: A | Discharge status: Home / Self Care | Payer: 59 | Source: Ambulatory Visit | Attending: Obstetrics and Gynecology | Admitting: Obstetrics and Gynecology

## 2016-07-14 DIAGNOSIS — N644 Mastodynia: Secondary | ICD-10-CM

## 2016-07-15 ENCOUNTER — Telehealth: Payer: Self-pay

## 2016-07-15 NOTE — Telephone Encounter (Signed)
Patient is faxing form that the insurance needs filled out by Dr. Mingo Amber. Please look out for fax and fax back to the patient. Patients needs it filled out soon as possible! Fax: [phone]

## 2016-07-18 ENCOUNTER — Other Ambulatory Visit: Payer: Self-pay | Admitting: Interventional Cardiology

## 2016-07-18 ENCOUNTER — Other Ambulatory Visit: Payer: Self-pay | Admitting: Allergy and Immunology

## 2016-07-18 MED FILL — VITAMIN D 2,000 UNIT SOFTGE: 50 MCG | 90 days supply | Qty: 90 | Fill #0

## 2016-07-18 MED FILL — raNITIdine HCL 300 MG TABS: 300 | 30 days supply | Qty: 30 | Fill #3

## 2016-07-18 NOTE — Telephone Encounter (Signed)
I can complete this when I'm back at [facility] on Wednesday.

## 2016-07-19 MED FILL — MAGNESIUM OXIDE 500 MG TAB: 500 | 120 days supply | Qty: 120 | Fill #0

## 2016-07-19 MED FILL — raNITIdine HCL 300 MG TABS: 300 | 30 days supply | Qty: 30 | Fill #0

## 2016-07-19 NOTE — Telephone Encounter (Signed)
Please make sure this form is placed in the FMLA/DIsability box once it is completed so that we can get a copy of it placed in the patients chart and faxed to the right place.   Not sure why this wasn't sent to me first, I handle all insurance request forms and record releases. So just FYI for next time please place these in my box at the check out desk.   Thank you!

## 2016-07-20 NOTE — Telephone Encounter (Signed)
I have completed this.  Based on my note, I didn't take her out of work.  I also don't see a letter in that stating anything about work.  I'll put this in the box.

## 2016-07-21 NOTE — Telephone Encounter (Signed)
Paperwork scanned and faxed back to patient on 07/21/16

## 2016-08-02 ENCOUNTER — Encounter: Payer: Self-pay | Admitting: Interventional Cardiology

## 2016-08-02 ENCOUNTER — Ambulatory Visit (INDEPENDENT_AMBULATORY_CARE_PROVIDER_SITE_OTHER): Payer: 59 | Admitting: Interventional Cardiology

## 2016-08-02 VITALS — BP 126/72 | HR 68 | Ht 65.0 in | Wt 250.4 lb

## 2016-08-02 DIAGNOSIS — I639 Cerebral infarction, unspecified: Secondary | ICD-10-CM

## 2016-08-02 DIAGNOSIS — I4891 Unspecified atrial fibrillation: Secondary | ICD-10-CM

## 2016-08-02 DIAGNOSIS — E7800 Pure hypercholesterolemia, unspecified: Secondary | ICD-10-CM | POA: Diagnosis not present

## 2016-08-02 DIAGNOSIS — I25708 Atherosclerosis of coronary artery bypass graft(s), unspecified, with other forms of angina pectoris: Secondary | ICD-10-CM | POA: Diagnosis not present

## 2016-08-02 DIAGNOSIS — I10 Essential (primary) hypertension: Secondary | ICD-10-CM

## 2016-08-02 DIAGNOSIS — I350 Nonrheumatic aortic (valve) stenosis: Secondary | ICD-10-CM

## 2016-08-02 NOTE — Patient Instructions (Signed)
Medication Instructions:  Your physician recommends that you continue on your current medications as directed. Please refer to the Current Medication list given to you today.   Labwork: none  Testing/Procedures: Your physician has recommended that you wear an event monitor. Event monitors are medical devices that record the heart's electrical activity. Doctors most often [place] these monitors to diagnose arrhythmias. Arrhythmias are problems with the speed or rhythm of the heartbeat. The monitor is a small, portable device. You can wear one while you do your normal daily activities. This is usually used to diagnose what is causing palpitations/syncope (passing out).    Follow-Up: Your physician wants you to follow-up in: 12 months.  You will receive a reminder letter in the mail two months in advance. If you don't receive a letter, please call our office to schedule the follow-up appointment.   Any Other Special Instructions Will Be Listed Below (If Applicable).     If you need a refill on your cardiac medications before your next appointment, please call your pharmacy.

## 2016-08-02 NOTE — Progress Notes (Signed)
Cardiology Office Note    Date:  08/02/2016   ID:  Deanna Simmons, DOB Jun 14, 1951, MRN [id number]  PCP:  Henrine Screws, MD  Cardiologist: Sinclair Grooms, MD   Chief Complaint  Patient presents with  . Coronary Artery Disease  . Congestive Heart Failure    History of Present Illness:  Deanna Simmons is a 65 y.o. female with CAD, prior coronary bypass grafting, essential hypertension, asthma, diabetes mellitus, recent cryptogenic stroke manifesting as right retinal embolus, moderate aortic stenosis, morbid obesity, and suspicion for sleep apnea.  In August 2017, [place] developed partial blindness in her right eye. She was evaluated by ophthalmology and felt to have had a retinal embolus. Carotid Doppler did not reveal evidence of ruptured plaque or significant obstruction although there was 40% bilateral plaque. Previous echoes have not demonstrated embolic sources. Her echo for this year is pending. Overall, vision has improved in the right eye. Ophthalmology is increased aspirin to 325 mg daily. She is tolerating that well.  She is exercising on a regular basis. Weight has been difficult to lose. She denies angina. She denies palpitations. She snores significantly.    Past Medical History:  Diagnosis Date  . Abnormal finding on Pap smear, ASCUS   . Anemia   . Asthma   . Chondromalacia of knee   . Coronary artery disease   . Diabetes mellitus without complication ([place])   . Diverticulum 12/11/2002   in cecum  . Fibroids    h/o  . Glucose intolerance (impaired glucose tolerance)   . H/O elevated lipids   . H/O tinea cruris 02/2010  . History of ASCVD    s/p CABG  . Hypercholesteremia   . Hypertension   . Internal hemorrhoids    h/o  . Irregular menses    h/o  . MI (myocardial infarction) age 3  . Obesity   . Pneumonia 07/2008  . Synovitis of knee   . Systemic lupus erythematosus ([place])    with nephritis  . Vulvitis    h/o    Past Surgical History:    Procedure Laterality Date  . CESAREAN SECTION    . CORONARY ARTERY BYPASS GRAFT    . Endometrial curettage  03/29/2000  . ROTATOR CUFF REPAIR    . TUBAL LIGATION    . UPPER GASTROINTESTINAL ENDOSCOPY  12/11/2002    Current Medications: Outpatient Medications Prior to Visit  Medication Sig Dispense Refill  . aspirin EC 325 MG tablet Take 1 tablet (325 mg total) by mouth daily.  0  . budesonide-formoterol (SYMBICORT) 160-4.5 MCG/ACT inhaler Inhale 2 puffs into the lungs 2 (two) times daily. 10.2 g 5  . cetirizine (ZYRTEC) 10 MG tablet Take 10 mg by mouth daily.    . chlorpheniramine-HYDROcodone (TUSSIONEX) 10-8 MG/5ML SUER   0  . Cholecalciferol (VITAMIN D3) 2000 UNITS TABS Take 1 tablet by mouth daily.    Marland Kitchen Dextromethorphan-Guaifenesin (MUCINEX DM PO) Take by mouth as needed. Reported on 03/29/2016    . ezetimibe (ZETIA) 10 MG tablet TAKE 1 TABLET BY MOUTH ONCE DAILY 30 tablet 1  . fluconazole (DIFLUCAN) 150 MG tablet Take 150 mg by mouth as needed (itching/ yeast infections after antibiotic use).   2  . fluticasone (FLONASE) 50 MCG/ACT nasal spray Place 2 sprays into the nose daily as needed. Nasal congestion    . Magnesium Oxide 500 MG TABS   3  . metFORMIN (GLUCOPHAGE) 500 MG tablet Take 250 mg by mouth daily.    Marland Kitchen  metoprolol tartrate (LOPRESSOR) 25 MG tablet TAKE 1 TABLET BY MOUTH TWICE A DAY 180 tablet 0  . Multiple Vitamins-Minerals (MULTIVITAL PO) Take 1 tablet by mouth daily.    . nitroGLYCERIN (NITROSTAT) 0.4 MG SL tablet Place 0.4 mg under the tongue every 5 (five) minutes as needed for chest pain (3 doses MAX). Reported on 12/01/2015    . QVAR 80 MCG/ACT inhaler INHALE 2 PUFFS DAILY TO PREVENT COUGH/WHEEZE. RINSE, GARGLE, AND SPIT AFTER USE (3 PUFFS 3 X DAILY WITH FLARE UPS) 26.1 g 5  . ranitidine (ZANTAC) 300 MG tablet TAKE 1 TABLET BY MOUTH EACH EVENING DURING FLARE UP AS DIRECTED 30 tablet 1  . rosuvastatin (CRESTOR) 40 MG tablet Take 1 tablet (40 mg total) by mouth daily. 90  tablet 3  . sodium chloride (OCEAN) 0.65 % SOLN nasal spray Place 1 spray into both nostrils as needed for congestion.    . traMADol (ULTRAM) 50 MG tablet Take 1 tablet (50 mg total) by mouth every 8 (eight) hours as needed. 30 tablet 0  . TRUE METRIX BLOOD GLUCOSE TEST test strip   3  . TRUEPLUS LANCETS 30G MISC   3  . valACYclovir (VALTREX) 500 MG tablet Take 500 mg by mouth daily as needed (cold sores/blisters). Reported on 12/01/2015    . VENTOLIN HFA 108 (90 Base) MCG/ACT inhaler INHALE 2 PUFFS BY MOUTH EVERY 4-6 HOURS AS NEEDED FOR COUGH OR WHEEZE 18 g 1  . pantoprazole (PROTONIX) 40 MG tablet TAKE 1 TABLET BY MOUTH ONCE DAILY FOR REFLUX (Patient not taking: Reported on 08/02/2016) 90 tablet 0  . Probiotic Product (PROBIOTIC DAILY PO) Take by mouth daily.     No facility-administered medications prior to visit.      Allergies:   Biaxin [clarithromycin]; Cardiolite [technetium-78m]; Ciprofloxacin; Erythromycin base; Montelukast sodium; Niaspan [niacin er]; Penicillins; Prilosec [omeprazole]; Singulair [montelukast]; and Ticlid [ticlopidine hcl]   Social History   Social History  . Marital status: Married    Spouse name: Alvester Chou  . Number of children: 4  . Years of education: 12+   Occupational History  . CARE MANAGEMENT [employer]   Social History Main Topics  . Smoking status: Former Research scientist (life sciences)  . Smokeless tobacco: Never Used  . Alcohol use Yes     Comment: occasional  . Drug use: No  . Sexual activity: Not Asked   Other Topics Concern  . None   Social History Narrative   Lives with her husband. Their children are grown, 1 lives in [place], 1 in [place], [place],  1 in [place], [place], and one in [place], [place].     Family History:  The patient's family history includes Breast cancer in her mother; Diabetes in her father and mother; Heart attack in her paternal uncle; Hyperlipidemia in her father; Ovarian cancer in her cousin; Prostate cancer in her father; Stroke in  her mother.   ROS:   Please see the history of present illness.    Lower back discomfort. Decreased vision in the right eye as noted above.  All other systems reviewed and are negative.   PHYSICAL EXAM:   VS:  BP 126/72   Pulse 68   Ht 5\' 5"  (1.651 m)   Wt 250 lb 6.4 oz (113.6 kg)   BMI 41.67 kg/m    GEN: Well nourished, well developed, in no acute distress  HEENT: normal  Neck: no JVD, carotid bruits, or masses Cardiac: RRR; no murmurs, rubs, or gallops,no edema  Respiratory:  clear  to auscultation bilaterally, normal work of breathing GI: soft, nontender, nondistended, + BS MS: no deformity or atrophy  Skin: warm and dry, no rash Neuro:  Alert and Oriented x 3, Strength and sensation are intact Psych: euthymic mood, full affect  Wt Readings from Last 3 Encounters:  08/02/16 250 lb 6.4 oz (113.6 kg)  06/22/16 253 lb (114.8 kg)  06/21/16 250 lb 6.4 oz (113.6 kg)      Studies/Labs Reviewed:   EKG:  EKG  ECG reveals normal sinus rhythm and normal appearance.  Recent Labs: No results found for requested labs within last 8760 hours.   Lipid Panel    Component Value Date/Time   CHOL 120 03/11/2014 0841   TRIG 65.0 03/11/2014 0841   HDL 41.00 03/11/2014 0841   CHOLHDL 3 03/11/2014 0841   VLDL 13.0 03/11/2014 0841   LDLCALC 66 03/11/2014 0841    Additional studies/ records that were reviewed today include:  Reviewed carotid Doppler study. Bilateral 40% plaque. Heterogeneous plaque, bilaterally. Stable 1-39% bilateral ICA stenosis. Normal subclavian arteries, bilaterally. Patent vertebral arteries with antegrade flow.   Echo done last October 2016: Study Conclusions  - Left ventricle: The cavity size was normal. Wall thickness was   increased in a pattern of moderate LVH. Systolic function was   normal. The estimated ejection fraction was in the range of 60%   to 65%. Wall motion was normal; there were no regional wall   motion abnormalities. Doppler  parameters are consistent with   abnormal left ventricular relaxation (grade 1 diastolic   dysfunction). The E/e&' ratio is between 8-15, suggesting   indeterminate LV filling pressure. - Aortic valve: Moderate calcific aortic stenosis. There was mild   regurgitation. Mean gradient (S): 21 mm Hg. Peak gradient (S): 48   mm Hg. Valve area (VTI): 1.49 cm^2. Valve area (Vmax): 1.45 cm^2.   Valve area (Vmean): 1.62 cm^2. - Mitral valve: Calcified annulus. There was mild regurgitation. - Left atrium: The atrium was moderately dilated at 42 ml/m2. - Right atrium: The atrium was mildly dilated. - Tricuspid valve: There was trivial regurgitation. - Pulmonary arteries: PA peak pressure: 22 mm Hg (S). - Inferior vena cava: The vessel was normal in size. The   respirophasic diameter changes were in the normal range (= 50%),   consistent with normal central venous pressure.  Impressions:  - Compared to a prior echo in 2014, there is now moderate aortic   stenosis - AVA around 1.4-1.5 cm2 - mean gradient has increased   from 18 mmHg to 21 mmHg.   ASSESSMENT:    1. Coronary artery disease of bypass graft of native heart with stable angina pectoris ([place])   2. Essential hypertension   3. Moderate aortic stenosis   4. Hypercholesteremia   5. Cryptogenic stroke ([place])   6. Atrial fibrillation, unspecified type ([place])      PLAN:  In order of problems listed above:  1. Asymptomatic with reference to angina. Encourage aerobic activity and risk factor modification. 2. Blood pressure is under really good control. Target is 130/90 mmHg or less with history of diabetes and vascular disease. 3. Echocardiogram will be repeated shortly. She had moderate aortic stenosis/year. We discussed the natural history of aortic stenosis and we'll continue to follow closely. Aortic valve could've been a potential source of embolic debris that caused her right eye embolus. 4. Followed by primary care. LDL target  should be less than 70. 5. This involved her right eye. Significant carotid disease  is been excluded. We need to exclude the possibility of occult atrial fibrillation neck are an embolic source. 6. This is a suspected diagnosis. The monitor will be done to exclude this diagnosis. She has multiple risk factors including obesity, diabetes, hypertension, vascular disease, suspected sleep apnea.    Medication Adjustments/Labs and Tests Ordered: Current medicines are reviewed at length with the patient today.  Concerns regarding medicines are outlined above.  Medication changes, Labs and Tests ordered today are listed in the Patient Instructions below. Patient Instructions  Medication Instructions:  Your physician recommends that you continue on your current medications as directed. Please refer to the Current Medication list given to you today.   Labwork: none  Testing/Procedures: Your physician has recommended that you wear an event monitor. Event monitors are medical devices that record the heart's electrical activity. Doctors most often [place] these monitors to diagnose arrhythmias. Arrhythmias are problems with the speed or rhythm of the heartbeat. The monitor is a small, portable device. You can wear one while you do your normal daily activities. This is usually used to diagnose what is causing palpitations/syncope (passing out).    Follow-Up: Your physician wants you to follow-up in: 12 months.  You will receive a reminder letter in the mail two months in advance. If you don't receive a letter, please call our office to schedule the follow-up appointment.   Any Other Special Instructions Will Be Listed Below (If Applicable).     If you need a refill on your cardiac medications before your next appointment, please call your pharmacy.      Signed, Sinclair Grooms, MD  08/02/2016 9:43 AM    [facility] [place], [place], [place]  [address] Phone: [phone]; Fax: [phone]

## 2016-08-10 ENCOUNTER — Ambulatory Visit (INDEPENDENT_AMBULATORY_CARE_PROVIDER_SITE_OTHER): Payer: 59

## 2016-08-10 DIAGNOSIS — I4891 Unspecified atrial fibrillation: Secondary | ICD-10-CM

## 2016-08-10 DIAGNOSIS — I639 Cerebral infarction, unspecified: Secondary | ICD-10-CM

## 2016-08-12 DIAGNOSIS — I4891 Unspecified atrial fibrillation: Secondary | ICD-10-CM | POA: Diagnosis not present

## 2016-08-19 ENCOUNTER — Other Ambulatory Visit: Payer: Self-pay | Admitting: Interventional Cardiology

## 2016-08-19 DIAGNOSIS — H2513 Age-related nuclear cataract, bilateral: Secondary | ICD-10-CM | POA: Diagnosis not present

## 2016-08-19 DIAGNOSIS — H34211 Partial retinal artery occlusion, right eye: Secondary | ICD-10-CM | POA: Diagnosis not present

## 2016-08-19 MED FILL — ROSUVASTATIN CALCIUM 40 MG: 40 | 60 days supply | Qty: 60 | Fill #4

## 2016-08-19 MED FILL — EZETIMIBE 10 MG TABLET: 10 | 30 days supply | Qty: 30 | Fill #0

## 2016-08-19 NOTE — Telephone Encounter (Signed)
If followed by PCP, they should order meds.  Thanks!

## 2016-08-19 NOTE — Telephone Encounter (Signed)
Please advise on refill requests as the patient was just seen but last lipid panel in epic is from 2015. Per office visit lipids are followed by pcp. Thanks, MI

## 2016-08-22 DIAGNOSIS — Z6841 Body Mass Index (BMI) 40.0 and over, adult: Secondary | ICD-10-CM | POA: Diagnosis not present

## 2016-08-22 DIAGNOSIS — D259 Leiomyoma of uterus, unspecified: Secondary | ICD-10-CM | POA: Diagnosis not present

## 2016-08-22 DIAGNOSIS — Z01411 Encounter for gynecological examination (general) (routine) with abnormal findings: Secondary | ICD-10-CM | POA: Diagnosis not present

## 2016-08-24 ENCOUNTER — Ambulatory Visit (HOSPITAL_COMMUNITY): Payer: 59 | Attending: Cardiovascular Disease

## 2016-08-24 ENCOUNTER — Other Ambulatory Visit: Payer: Self-pay

## 2016-08-24 DIAGNOSIS — E785 Hyperlipidemia, unspecified: Secondary | ICD-10-CM | POA: Diagnosis not present

## 2016-08-24 DIAGNOSIS — E559 Vitamin D deficiency, unspecified: Secondary | ICD-10-CM | POA: Diagnosis not present

## 2016-08-24 DIAGNOSIS — Z79899 Other long term (current) drug therapy: Secondary | ICD-10-CM | POA: Diagnosis not present

## 2016-08-24 DIAGNOSIS — I080 Rheumatic disorders of both mitral and aortic valves: Secondary | ICD-10-CM | POA: Diagnosis not present

## 2016-08-24 DIAGNOSIS — H349 Unspecified retinal vascular occlusion: Secondary | ICD-10-CM | POA: Diagnosis not present

## 2016-08-24 DIAGNOSIS — Z0000 Encounter for general adult medical examination without abnormal findings: Secondary | ICD-10-CM | POA: Diagnosis not present

## 2016-08-24 DIAGNOSIS — I2510 Atherosclerotic heart disease of native coronary artery without angina pectoris: Secondary | ICD-10-CM | POA: Diagnosis not present

## 2016-08-24 DIAGNOSIS — I10 Essential (primary) hypertension: Secondary | ICD-10-CM | POA: Diagnosis not present

## 2016-08-24 DIAGNOSIS — E119 Type 2 diabetes mellitus without complications: Secondary | ICD-10-CM | POA: Diagnosis not present

## 2016-08-24 DIAGNOSIS — I350 Nonrheumatic aortic (valve) stenosis: Secondary | ICD-10-CM

## 2016-08-24 DIAGNOSIS — R252 Cramp and spasm: Secondary | ICD-10-CM | POA: Diagnosis not present

## 2016-08-24 DIAGNOSIS — D509 Iron deficiency anemia, unspecified: Secondary | ICD-10-CM | POA: Diagnosis not present

## 2016-08-31 ENCOUNTER — Ambulatory Visit (INDEPENDENT_AMBULATORY_CARE_PROVIDER_SITE_OTHER): Payer: 59 | Admitting: Physician Assistant

## 2016-08-31 VITALS — BP 110/68 | HR 79 | Temp 98.2°F | Resp 16 | Ht 65.0 in | Wt 250.0 lb

## 2016-08-31 DIAGNOSIS — J069 Acute upper respiratory infection, unspecified: Secondary | ICD-10-CM | POA: Diagnosis not present

## 2016-08-31 DIAGNOSIS — B9789 Other viral agents as the cause of diseases classified elsewhere: Secondary | ICD-10-CM | POA: Diagnosis not present

## 2016-08-31 MED ORDER — BENZONATATE 100 MG PO CAPS: 100.0000 mg | ORAL_CAPSULE | Freq: Three times a day (TID) | ORAL | 0 refills | Status: DC | PRN

## 2016-08-31 NOTE — Progress Notes (Signed)
[facility] [address], [place] [address] [phone]  Date:  08/31/2016   Name:  Deanna Simmons   DOB:  07-Feb-1951   MRN:  [id number]  PCP:  Henrine Screws, MD    History of Present Illness:  Deanna Simmons is a 65 y.o. female patient who presents to [facility] for cc of cough, congestion. Patient has had several days of congestion and cough. At first she assumed that this may be some allergies. She is having cough that is nonproductive. She feels some tightness in her chest however no shortness of breath. For the tightness in her chest, she has used her Qvar and Symbicort. She is not using the albuterol at this time. She does not do the Qvar and Symbicort daily as instructed. She will likely due to the Qvar more because the Symbicort makes her throat scratchy. She denies any fever. There is no ear pain. She has mild sore throat. She hydrates very little.     Patient Active Problem List   Diagnosis Date Noted  . Right carotid bruit 08/03/2015  . Moderate aortic stenosis 07/18/2014  . CAD (coronary artery disease) of artery bypass graft   . Hypercholesteremia   . Obesity   . Systemic lupus erythematosus ([place])   . Anemia   . Chondromalacia of knee   . Synovitis of knee   . Hypertension   . Fibroids   . Internal hemorrhoids   . Diverticulum   . H/O tinea cruris   . H/O elevated lipids   . Vulvitis   . Abnormal finding on Pap smear, ASCUS   . Irregular menses   . Old MI (myocardial infarction)     Past Medical History:  Diagnosis Date  . Abnormal finding on Pap smear, ASCUS   . Anemia   . Asthma   . Chondromalacia of knee   . Coronary artery disease   . Diabetes mellitus without complication ([place])   . Diverticulum 12/11/2002   in cecum  . Fibroids    h/o  . Glucose intolerance (impaired glucose tolerance)   . H/O elevated lipids   . H/O tinea cruris 02/2010  . History of ASCVD    s/p CABG  . Hypercholesteremia   . Hypertension   .  Internal hemorrhoids    h/o  . Irregular menses    h/o  . MI (myocardial infarction) age 49  . Obesity   . Pneumonia 07/2008  . Synovitis of knee   . Systemic lupus erythematosus ([place])    with nephritis  . Vulvitis    h/o    Past Surgical History:  Procedure Laterality Date  . CESAREAN SECTION    . CORONARY ARTERY BYPASS GRAFT    . Endometrial curettage  03/29/2000  . ROTATOR CUFF REPAIR    . TUBAL LIGATION    . UPPER GASTROINTESTINAL ENDOSCOPY  12/11/2002    Social History  Substance Use Topics  . Smoking status: Former Research scientist (life sciences)  . Smokeless tobacco: Never Used  . Alcohol use Yes     Comment: occasional    Family History  Problem Relation Age of Onset  . Breast cancer Mother   . Diabetes Mother   . Stroke Mother   . Prostate cancer Father   . Diabetes Father   . Hyperlipidemia Father   . Heart attack Paternal Uncle     2 paternal uncles  . Ovarian cancer Cousin     Allergies  Allergen Reactions  .  Biaxin [Clarithromycin] Itching  . Cardiolite [Technetium-54m] Itching  . Ciprofloxacin Itching  . Erythromycin Base Itching  . Montelukast Sodium Itching  . Niaspan [Niacin Er] Other (See Comments)    flushing  . Penicillins Itching  . Prilosec [Omeprazole] Itching  . Singulair [Montelukast]   . Ticlid [Ticlopidine Hcl] Itching    Medication list has been reviewed and updated.  Current Outpatient Prescriptions on File Prior to Visit  Medication Sig Dispense Refill  . budesonide-formoterol (SYMBICORT) 160-4.5 MCG/ACT inhaler Inhale 2 puffs into the lungs 2 (two) times daily. 10.2 g 5  . cetirizine (ZYRTEC) 10 MG tablet Take 10 mg by mouth daily.    . Cholecalciferol (VITAMIN D3) 2000 UNITS TABS Take 1 tablet by mouth daily.    Marland Kitchen Dextromethorphan-Guaifenesin (MUCINEX DM PO) Take by mouth as needed. Reported on 03/29/2016    . ezetimibe (ZETIA) 10 MG tablet TAKE 1 TABLET BY MOUTH ONCE DAILY 30 tablet 1  . fluconazole (DIFLUCAN) 150 MG tablet Take 150 mg by mouth  as needed (itching/ yeast infections after antibiotic use).   2  . fluticasone (FLONASE) 50 MCG/ACT nasal spray Place 2 sprays into the nose daily as needed. Nasal congestion    . Magnesium Oxide 500 MG TABS   3  . metFORMIN (GLUCOPHAGE) 500 MG tablet Take 250 mg by mouth daily.    . metoprolol tartrate (LOPRESSOR) 25 MG tablet TAKE 1 TABLET BY MOUTH TWICE A DAY 180 tablet 0  . Multiple Vitamins-Minerals (MULTIVITAL PO) Take 1 tablet by mouth daily.    . nitroGLYCERIN (NITROSTAT) 0.4 MG SL tablet Place 0.4 mg under the tongue every 5 (five) minutes as needed for chest pain (3 doses MAX). Reported on 12/01/2015    . Probiotic Product (ULTRAFLORA IMMUNE HEALTH PO) Take 1 tablet by mouth at bedtime.    Marland Kitchen QVAR 80 MCG/ACT inhaler INHALE 2 PUFFS DAILY TO PREVENT COUGH/WHEEZE. RINSE, GARGLE, AND SPIT AFTER USE (3 PUFFS 3 X DAILY WITH FLARE UPS) 26.1 g 5  . ranitidine (ZANTAC) 300 MG tablet TAKE 1 TABLET BY MOUTH EACH EVENING DURING FLARE UP AS DIRECTED 30 tablet 1  . rosuvastatin (CRESTOR) 40 MG tablet Take 1 tablet (40 mg total) by mouth daily. 90 tablet 3  . sodium chloride (OCEAN) 0.65 % SOLN nasal spray Place 1 spray into both nostrils as needed for congestion.    . TRUE METRIX BLOOD GLUCOSE TEST test strip   3  . TRUEPLUS LANCETS 30G MISC   3  . valACYclovir (VALTREX) 500 MG tablet Take 500 mg by mouth daily as needed (cold sores/blisters). Reported on 12/01/2015    . VENTOLIN HFA 108 (90 Base) MCG/ACT inhaler INHALE 2 PUFFS BY MOUTH EVERY 4-6 HOURS AS NEEDED FOR COUGH OR WHEEZE 18 g 1  . aspirin EC 325 MG tablet Take 1 tablet (325 mg total) by mouth daily. (Patient not taking: Reported on 08/31/2016)  0  . chlorpheniramine-HYDROcodone (TUSSIONEX) 10-8 MG/5ML SUER   0  . traMADol (ULTRAM) 50 MG tablet Take 1 tablet (50 mg total) by mouth every 8 (eight) hours as needed. (Patient not taking: Reported on 08/31/2016) 30 tablet 0   No current facility-administered medications on file prior to visit.      ROS ROS otherwise unremarkable unless listed above.  Physical Examination: BP 110/68 (BP Location: Right Arm, Patient Position: Sitting, Cuff Size: Small)   Pulse 79   Temp 98.2 F (36.8 C) (Oral)   Resp 16   Ht 5\' 5"  (1.651 m)  Wt 250 lb (113.4 kg)   SpO2 97%   BMI 41.60 kg/m  Ideal Body Weight: Weight in (lb) to have BMI = 25: 149.9  Physical Exam  Constitutional: She is oriented to person, place, and time. She appears well-developed and well-nourished. No distress.  HENT:  Head: Normocephalic and atraumatic.  Right Ear: Tympanic membrane, external ear and ear canal normal.  Left Ear: Tympanic membrane, external ear and ear canal normal.  Nose: Mucosal edema and rhinorrhea present. Right sinus exhibits no maxillary sinus tenderness and no frontal sinus tenderness. Left sinus exhibits no maxillary sinus tenderness and no frontal sinus tenderness.  Mouth/Throat: No uvula swelling. No oropharyngeal exudate, posterior oropharyngeal edema or posterior oropharyngeal erythema.  Eyes: Conjunctivae and EOM are normal. Pupils are equal, round, and reactive to light.  Cardiovascular: Normal rate and regular rhythm.  Exam reveals no gallop, no distant heart sounds and no friction rub.   No murmur heard. Pulmonary/Chest: Effort normal. No apnea. No respiratory distress. She has no decreased breath sounds. She has no wheezes. She has no rhonchi.  Lymphadenopathy:       Head (right side): No submandibular, no tonsillar, no preauricular and no posterior auricular adenopathy present.       Head (left side): No submandibular, no tonsillar, no preauricular and no posterior auricular adenopathy present.  Neurological: She is alert and oriented to person, place, and time.  Skin: She is not diaphoretic.  Psychiatric: She has a normal mood and affect. Her behavior is normal.     Assessment and Plan: Deanna Simmons is a 65 y.o. female who is here today for cough and congestion. This appears  to be an upper respiratory infection of viral etiology. There is postnasal drip. I'm advising that she continue the Flonase and her Zyrtec. We will also to the Qvar daily for preventative treatment. Educated her on the use of this preventative drug as well as using the inhaler for rescue and when to seek urgent treatment. She'll follow-up if her symptoms do not improve in the next 6 days.  If her symptoms do not improve, we can suggest doxycycline 100mg  bid for 7 days at that time. Advised hydration as well as Mucinex. Viral URI - Plan: benzonatate (TESSALON) 100 MG capsule  Ivar Drape, PA-C [facility] 11/3/201710:52 AM

## 2016-08-31 NOTE — Patient Instructions (Addendum)
Please do the mucinex 1200mg  every 12 hours.  I would like you to make sure you are hydrating well with 64 oz of water.   Do the qvar as prescribed.  This is a preventative inhaler to avoid asthma and bronchospasms.  So is the symbicort.  The albuterol is your rescue inhaler and used when you are having trouble breathing. Upper Respiratory Infection, Adult Most upper respiratory infections (URIs) are a viral infection of the air passages leading to the lungs. A URI affects the nose, throat, and upper air passages. The most common type of URI is nasopharyngitis and is typically referred to as "the common cold." URIs run their course and usually go away on their own. Most of the time, a URI does not require medical attention, but sometimes a bacterial infection in the upper airways can follow a viral infection. This is called a secondary infection. Sinus and middle ear infections are common types of secondary upper respiratory infections. Bacterial pneumonia can also complicate a URI. A URI can worsen asthma and chronic obstructive pulmonary disease (COPD). Sometimes, these complications can require emergency medical care and may be life threatening.  CAUSES Almost all URIs are caused by viruses. A virus is a type of germ and can spread from one person to another.  RISKS FACTORS You may be at risk for a URI if:   You smoke.   You have chronic heart or lung disease.  You have a weakened defense (immune) system.   You are very young or very old.   You have nasal allergies or asthma.  You work in crowded or poorly ventilated areas.  You work in health care facilities or schools. SIGNS AND SYMPTOMS  Symptoms typically develop 2-3 days after you come in contact with a cold virus. Most viral URIs last 7-10 days. However, viral URIs from the influenza virus (flu virus) can last 14-18 days and are typically more severe. Symptoms may include:   Runny or stuffy (congested) nose.   Sneezing.    Cough.   Sore throat.   Headache.   Fatigue.   Fever.   Loss of appetite.   Pain in your forehead, behind your eyes, and over your cheekbones (sinus pain).  Muscle aches.  DIAGNOSIS  Your health care provider may diagnose a URI by:  Physical exam.  Tests to check that your symptoms are not due to another condition such as:  Strep throat.  Sinusitis.  Pneumonia.  Asthma. TREATMENT  A URI goes away on its own with time. It cannot be cured with medicines, but medicines may be prescribed or recommended to relieve symptoms. Medicines may help:  Reduce your fever.  Reduce your cough.  Relieve nasal congestion. HOME CARE INSTRUCTIONS   Take medicines only as directed by your health care provider.   Gargle warm saltwater or take cough drops to comfort your throat as directed by your health care provider.  Use a warm mist humidifier or inhale steam from a shower to increase air moisture. This may make it easier to breathe.  Drink enough fluid to keep your urine clear or pale yellow.   Eat soups and other clear broths and maintain good nutrition.   Rest as needed.   Return to work when your temperature has returned to normal or as your health care provider advises. You may need to stay home longer to avoid infecting others. You can also use a face mask and careful hand washing to prevent spread of the virus.  Increase the usage of your inhaler if you have asthma.   Do not use any tobacco products, including cigarettes, chewing tobacco, or electronic cigarettes. If you need help quitting, ask your health care provider. PREVENTION  The best way to protect yourself from getting a cold is to practice good hygiene.   Avoid oral or hand contact with people with cold symptoms.   Wash your hands often if contact occurs.  There is no clear evidence that vitamin C, vitamin E, echinacea, or exercise reduces the chance of developing a cold. However, it is  always recommended to get plenty of rest, exercise, and practice good nutrition.  SEEK MEDICAL CARE IF:   You are getting worse rather than better.   Your symptoms are not controlled by medicine.   You have chills.  You have worsening shortness of breath.  You have brown or red mucus.  You have yellow or brown nasal discharge.  You have pain in your face, especially when you bend forward.  You have a fever.  You have swollen neck glands.  You have pain while swallowing.  You have white areas in the back of your throat. SEEK IMMEDIATE MEDICAL CARE IF:   You have severe or persistent:  Headache.  Ear pain.  Sinus pain.  Chest pain.  You have chronic lung disease and any of the following:  Wheezing.  Prolonged cough.  Coughing up blood.  A change in your usual mucus.  You have a stiff neck.  You have changes in your:  Vision.  Hearing.  Thinking.  Mood. MAKE SURE YOU:   Understand these instructions.  Will watch your condition.  Will get help right away if you are not doing well or get worse.   This information is not intended to replace advice given to you by your health care provider. Make sure you discuss any questions you have with your health care provider.   Document Released: 04/12/2001 Document Revised: 03/03/2015 Document Reviewed: 01/22/2014 Elsevier Interactive Patient Education 2016 [employer].   IF you received an x-ray today, you will receive an invoice from [facility]. Please contact [facility] at [phone] with questions or concerns regarding your invoice.   IF you received labwork today, you will receive an invoice from [employer]. Please contact [facility] at [phone] with questions or concerns regarding your invoice.   Our billing staff will not be able to assist you with questions regarding bills from these companies.  You will be contacted with the lab results as  soon as they are available. The fastest way to get your results is to activate your My Chart account. Instructions are located on the last page of this paperwork. If you have not heard from [place] regarding the results in 2 weeks, please contact this office.

## 2016-09-01 ENCOUNTER — Other Ambulatory Visit: Payer: Self-pay | Admitting: Interventional Cardiology

## 2016-09-01 MED FILL — raNITIdine HCL 300 MG TABS: 300 | 30 days supply | Qty: 30 | Fill #1

## 2016-09-01 MED FILL — BENZONATATE 100 MG CAPSULE: 100 | 6 days supply | Qty: 40 | Fill #0

## 2016-09-01 MED FILL — METOPROLOL TARTRATE 25 MG T: 25 | 90 days supply | Qty: 180 | Fill #0

## 2016-09-01 MED FILL — VENTOLIN HFA 90 MCG INHALER: 108 (90 BAS | 16 days supply | Qty: 18 | Fill #1

## 2016-09-06 ENCOUNTER — Encounter (INDEPENDENT_AMBULATORY_CARE_PROVIDER_SITE_OTHER): Payer: Self-pay | Admitting: Orthopaedic Surgery

## 2016-09-06 ENCOUNTER — Ambulatory Visit (INDEPENDENT_AMBULATORY_CARE_PROVIDER_SITE_OTHER): Payer: Self-pay

## 2016-09-06 ENCOUNTER — Ambulatory Visit (INDEPENDENT_AMBULATORY_CARE_PROVIDER_SITE_OTHER): Payer: Worker's Compensation | Admitting: Orthopaedic Surgery

## 2016-09-06 VITALS — BP 122/75 | HR 72 | Ht 65.0 in | Wt 248.0 lb

## 2016-09-06 DIAGNOSIS — G8929 Other chronic pain: Secondary | ICD-10-CM | POA: Insufficient documentation

## 2016-09-06 DIAGNOSIS — M25562 Pain in left knee: Secondary | ICD-10-CM

## 2016-09-06 NOTE — Progress Notes (Addendum)
Office Visit Note   Patient: Deanna Simmons           Date of Birth: May 22, 1951           MRN: [id number] Visit Date: 09/06/2016 Requested by: insurance company workers comp PCP: Henrine Screws, MD  Subjective: Chief Complaint  Patient presents with  . Left Knee - Pain   65 yo female with OTJI to left knee.  Patient is here complaining of left knee pain, s/p DOI  February 2007, OTJI when she hit her knee on a desk.  She has been seen at [facility] by Dr Noemi Chapel since then.  When her knee hurts her, it throbs anteriorly.  It occasionaly gives way and she has to sit down, she has not fallen.  She says the left knee is always swollen.  She has had many cortisone injections, then had Supartz injection.  That helped her.  Then [facility] sent her here for second opinion.  Patient states WC wants her to have surgery, TKA.  Patient says she is fine with the shots.                  Review of Systems  Constitutional: Negative for chills and diaphoresis.  HENT: Negative for ear discharge, ear pain and nosebleeds.   Eyes: Negative for discharge and visual disturbance.  Respiratory: Negative for cough, choking and shortness of breath.   Cardiovascular: Negative for chest pain and palpitations.  Gastrointestinal: Negative for abdominal distention and abdominal pain.  Endocrine: Negative for cold intolerance and heat intolerance.  Genitourinary: Negative for flank pain and hematuria.  Skin: Negative for rash and wound.  Neurological: Negative for seizures and speech difficulty.  Hematological: Negative for adenopathy. Does not bruise/bleed easily.  Psychiatric/Behavioral: Negative for agitation and suicidal ideas.     Assessment & Plan: Visit Diagnoses:  1. Chronic pain of left knee   On-the-job injury with some knee osteoarthritis primarily medial worse than the lateral and patellofemoral.  Plan: Currently patient sleeping well she is able to go wherever she wants she states although cut the knee  occasionally aches it does not stop her from doing anything that she wants to do. She had open heart surgery with single bypass which is doing well. Currently she does not want to consider total knee arthroplasty and once to wait until her symptoms are bad enough that it stops her from doing things that she needs to do. If she did not have coronary artery disease hypertension should be a good candidate for occasional or daily anti-inflammatories. With our condition and hypertension this is increases her cardiac risks. She's done well with occasional disco injection she states they gave her better relief than the cortisone. Currently there is no need for further surgery and she'll continue to work on walking work on weight loss to help her heart stay with her cardiac rehabilitation exercise program she could add an exercise bike which should bother her knee and also consider elliptical. Her [facility] rehabilitation nurse Sydnee Levans RN, CCM was present today fax number [phone] Follow-Up Instructions: Return if symptoms worsen or fail to improve.   Orders:  Orders Placed This Encounter  Procedures  . XR Knee 1-2 Views Left   No orders of the defined types were placed in this encounter.     Procedures: No procedures performed   Clinical Data: No additional findings.  Objective: Vital Signs: BP 122/75   Pulse 72   Ht 5\' 5"  (1.651 m)   Wt  248 lb (112.5 kg)   BMI 41.27 kg/m   Physical Exam  Constitutional: She is oriented to person, place, and time. She appears well-developed.  HENT:  Head: Normocephalic.  Right Ear: External ear normal.  Left Ear: External ear normal.  Eyes: Pupils are equal, round, and reactive to light.  Neck: No tracheal deviation present. No thyromegaly present.  Cardiovascular: Normal rate.   Pulmonary/Chest: Effort normal.  Abdominal: Soft.  Neurological: She is alert and oriented to person, place, and time.  Skin: Skin is warm and dry.  Psychiatric: She  has a normal mood and affect. Her behavior is normal.    Ortho Exam patient is able to get from sitting to standing position temporally. She is ambulatory without significant knee limp. She has good hip range of motion crepitus with both knee range of motion. Chest full extension good quad strength flexion 120 bilaterally. Tenderness more on the medial lateral joint line on the left knee minimal on the right knee. Patellar tracking is good she has considerable patellofemoral crepitus with extension negative apprehension no subluxation. No pitting edema ankle range of motion is normal negative straight leg raise negative positive compression test.  Specialty Comments:  No specialty comments available.  Imaging: Xr Knee 1-2 Views Left  Result Date: 09/06/2016 Standing AP x-ray of the knee demonstrates the medial and lateral spurring with medial joint space narrowing worse on the left knee than right knee. She has some patellar spurring as well noted on AP x-ray. Impression  moderate left knee OA. Mild right knee OA.:    PMFS History: Patient Active Problem List   Diagnosis Date Noted  . Chronic pain of left knee 09/06/2016  . Right carotid bruit 08/03/2015  . Moderate aortic stenosis 07/18/2014  . CAD (coronary artery disease) of artery bypass graft   . Hypercholesteremia   . Obesity   . Systemic lupus erythematosus ([place])   . Anemia   . Chondromalacia of knee   . Synovitis of knee   . Hypertension   . Fibroids   . Internal hemorrhoids   . Diverticulum   . H/O tinea cruris   . H/O elevated lipids   . Vulvitis   . Abnormal finding on Pap smear, ASCUS   . Irregular menses   . Old MI (myocardial infarction)    Past Medical History:  Diagnosis Date  . Abnormal finding on Pap smear, ASCUS   . Anemia   . Asthma   . Chondromalacia of knee   . Coronary artery disease   . Diabetes mellitus without complication ([place])   . Diverticulum 12/11/2002   in cecum  . Fibroids    h/o  .  Glucose intolerance (impaired glucose tolerance)   . H/O elevated lipids   . H/O tinea cruris 02/2010  . History of ASCVD    s/p CABG  . Hypercholesteremia   . Hypertension   . Internal hemorrhoids    h/o  . Irregular menses    h/o  . MI (myocardial infarction) age 72  . Obesity   . Pneumonia 07/2008  . Synovitis of knee   . Systemic lupus erythematosus ([place])    with nephritis  . Vulvitis    h/o    Family History  Problem Relation Age of Onset  . Breast cancer Mother   . Diabetes Mother   . Stroke Mother   . Prostate cancer Father   . Diabetes Father   . Hyperlipidemia Father   . Heart attack Paternal Uncle  2 paternal uncles  . Ovarian cancer Cousin     Past Surgical History:  Procedure Laterality Date  . CESAREAN SECTION    . CORONARY ARTERY BYPASS GRAFT    . Endometrial curettage  03/29/2000  . ROTATOR CUFF REPAIR    . TUBAL LIGATION    . UPPER GASTROINTESTINAL ENDOSCOPY  12/11/2002   Social History   Occupational History  . CARE MANAGEMENT [employer]   Social History Main Topics  . Smoking status: Former Research scientist (life sciences)  . Smokeless tobacco: Never Used  . Alcohol use Yes     Comment: occasional  . Drug use: No  . Sexual activity: Not on file

## 2016-09-14 ENCOUNTER — Telehealth: Payer: Self-pay | Admitting: Interventional Cardiology

## 2016-09-14 NOTE — Telephone Encounter (Signed)
Pt is calling to speak with nurse wanting to ask about heart monitor/ and results

## 2016-09-14 NOTE — Telephone Encounter (Signed)
Informed pt we are still waiting for monitor to be downloaded for Dr. Tamala Julian to review and that I will call as soon as results are available.  Pt verbalized understanding and was appreciative for results.

## 2016-09-16 ENCOUNTER — Other Ambulatory Visit: Payer: Self-pay | Admitting: Allergy and Immunology

## 2016-09-16 MED ORDER — RANITIDINE HCL 300 MG PO TABS: ORAL_TABLET | ORAL | 0 refills | Status: DC

## 2016-09-16 MED FILL — EZETIMIBE 10 MG TABLET: 10 | 30 days supply | Qty: 30 | Fill #1

## 2016-09-16 MED FILL — metFORMIN HCL 500 MG TABS: 500 | 90 days supply | Qty: 90 | Fill #2

## 2016-09-16 NOTE — Telephone Encounter (Signed)
Patient called requesting a refill for Zantac. She said the pharmacy told her that our office said she needed to be seen. I looked up her last visit and it was 03-29-16. She told me no, she has been here recently. All I could find was a prescription for Zantac that was sent on 07-19-16. She said she was here recently because she was given a breathing test.

## 2016-09-16 NOTE — Telephone Encounter (Signed)
Gave pt a 90 day refill. Spoke with pt, had her schedule an appointment to be seen.

## 2016-09-20 ENCOUNTER — Ambulatory Visit
Admission: RE | Admit: 2016-09-20 | Discharge: 2016-09-20 | Disposition: A | Discharge status: Home / Self Care | Payer: 59 | Source: Ambulatory Visit | Attending: Obstetrics and Gynecology | Admitting: Obstetrics and Gynecology

## 2016-09-20 DIAGNOSIS — R921 Mammographic calcification found on diagnostic imaging of breast: Secondary | ICD-10-CM | POA: Diagnosis not present

## 2016-09-27 ENCOUNTER — Encounter: Payer: Self-pay | Admitting: Allergy and Immunology

## 2016-09-27 ENCOUNTER — Ambulatory Visit (INDEPENDENT_AMBULATORY_CARE_PROVIDER_SITE_OTHER): Payer: 59 | Admitting: Allergy and Immunology

## 2016-09-27 VITALS — BP 114/78 | HR 68 | Resp 20

## 2016-09-27 DIAGNOSIS — J4540 Moderate persistent asthma, uncomplicated: Secondary | ICD-10-CM | POA: Diagnosis not present

## 2016-09-27 DIAGNOSIS — J3089 Other allergic rhinitis: Secondary | ICD-10-CM

## 2016-09-27 DIAGNOSIS — K219 Gastro-esophageal reflux disease without esophagitis: Secondary | ICD-10-CM | POA: Diagnosis not present

## 2016-09-27 MED ORDER — RANITIDINE HCL 300 MG PO TABS: ORAL_TABLET | ORAL | 1 refills | Status: DC

## 2016-09-27 NOTE — Progress Notes (Signed)
Follow-up Note  Referring Provider: Josetta Huddle, MD Primary Provider: Henrine Screws, MD Date of Office Visit: 09/27/2016  Subjective:   Deanna Simmons (DOB: 1951/01/14) is a 65 y.o. female who returns to the [facility] [place] on 09/27/2016 in re-evaluation of the following:  HPI: Deanna Simmons returns to this clinic in evaluation of her asthma and allergic rhinitis and LPR. I've not seen her in his clinic since May 2017.  She has had very good control of her asthma. She uses her medications relatively unconventional only using Symbicort at points in time when she does develop some problems with her lower airways. She has not had activate an action plan which includes a combination of Symbicort and Qvar. She has not required a systemic steroid to treat an asthma exacerbation. She rarely uses a short acting bronchodilator.  Her upper airways are doing relatively well while intermittently using Flonase.  Her reflux has been doing relatively well while using ranitidine 300 mg daily. She still has about 16 ounces of coffee per day.  She did receive the flu vaccine this year.    Medication List      aspirin 81 MG tablet Take 81 mg by mouth daily.   budesonide-formoterol 160-4.5 MCG/ACT inhaler Commonly known as:  SYMBICORT Inhale 2 puffs into the lungs 2 (two) times daily.   cetirizine 10 MG tablet Commonly known as:  ZYRTEC Take 10 mg by mouth daily.   ezetimibe 10 MG tablet Commonly known as:  ZETIA TAKE 1 TABLET BY MOUTH ONCE DAILY   fluticasone 50 MCG/ACT nasal spray Commonly known as:  FLONASE Place 2 sprays into the nose daily as needed. Nasal congestion   Magnesium Oxide 500 MG Tabs   metFORMIN 500 MG tablet Commonly known as:  GLUCOPHAGE Take 250 mg by mouth daily.   metoprolol tartrate 25 MG tablet Commonly known as:  LOPRESSOR TAKE 1 TABLET BY MOUTH TWICE DAILY   MUCINEX DM PO Take by mouth as needed. Reported on 03/29/2016   MULTIVITAL  PO Take 1 tablet by mouth daily.   nitroGLYCERIN 0.4 MG SL tablet Commonly known as:  NITROSTAT Place 0.4 mg under the tongue every 5 (five) minutes as needed for chest pain (3 doses MAX). Reported on 12/01/2015   QVAR 80 MCG/ACT inhaler Generic drug:  beclomethasone INHALE 2 PUFFS DAILY TO PREVENT COUGH/WHEEZE. RINSE, GARGLE, AND SPIT AFTER USE (3 PUFFS 3 X DAILY WITH FLARE UPS)   ranitidine 300 MG tablet Commonly known as:  ZANTAC TAKE 1 TABLET BY MOUTH EACH EVENING DURING FLARE UP AS DIRECTED   rosuvastatin 40 MG tablet Commonly known as:  CRESTOR Take 1 tablet (40 mg total) by mouth daily.   sodium chloride 0.65 % Soln nasal spray Commonly known as:  OCEAN Place 1 spray into both nostrils as needed for congestion.   TRUE METRIX BLOOD GLUCOSE TEST test strip Generic drug:  glucose blood   TRUEPLUS LANCETS 30G Misc   ULTRAFLORA IMMUNE HEALTH PO Take 1 tablet by mouth at bedtime.   valACYclovir 500 MG tablet Commonly known as:  VALTREX Take 500 mg by mouth daily as needed (cold sores/blisters). Reported on 12/01/2015   VENTOLIN HFA 108 (90 Base) MCG/ACT inhaler Generic drug:  albuterol INHALE 2 PUFFS BY MOUTH EVERY 4-6 HOURS AS NEEDED FOR COUGH OR WHEEZE   Vitamin D3 2000 units Tabs Take 1 tablet by mouth daily.       Past Medical History:  Diagnosis Date  . Abnormal finding  on Pap smear, ASCUS   . Anemia   . Asthma   . Chondromalacia of knee   . Coronary artery disease   . Diabetes mellitus without complication ([place])   . Diverticulum 12/11/2002   in cecum  . Fibroids    h/o  . Glucose intolerance (impaired glucose tolerance)   . H/O elevated lipids   . H/O tinea cruris 02/2010  . History of ASCVD    s/p CABG  . Hypercholesteremia   . Hypertension   . Internal hemorrhoids    h/o  . Irregular menses    h/o  . MI (myocardial infarction) age 41  . Obesity   . Pneumonia 07/2008  . Synovitis of knee   . Systemic lupus erythematosus ([place])    with  nephritis  . Vulvitis    h/o    Past Surgical History:  Procedure Laterality Date  . CESAREAN SECTION    . CORONARY ARTERY BYPASS GRAFT    . Endometrial curettage  03/29/2000  . ROTATOR CUFF REPAIR    . TUBAL LIGATION    . UPPER GASTROINTESTINAL ENDOSCOPY  12/11/2002    Allergies  Allergen Reactions  . Biaxin [Clarithromycin] Itching  . Cardiolite [Technetium-1m] Itching  . Ciprofloxacin Itching  . Erythromycin Base Itching  . Montelukast Sodium Itching  . Niaspan [Niacin Er] Other (See Comments)    flushing  . Penicillins Itching  . Prilosec [Omeprazole] Itching  . Singulair [Montelukast]   . Ticlid [Ticlopidine Hcl] Itching    Review of systems negative except as noted in HPI / PMHx or noted below:  Review of Systems  Constitutional: Negative.   HENT: Negative.   Eyes: Negative.        Recent partial retinal artery occlusion with retinal damage affecting right eye August 2017  Respiratory: Negative.   Cardiovascular: Negative.   Gastrointestinal: Negative.   Genitourinary: Negative.   Musculoskeletal: Negative.   Skin: Negative.   Neurological: Negative.   Endo/Heme/Allergies: Negative.   Psychiatric/Behavioral: Negative.      Objective:   Vitals:   09/27/16 1751  BP: 114/78  Pulse: 68  Resp: 20          Physical Exam  Constitutional: She is well-developed, well-nourished, and in no distress.  HENT:  Head: Normocephalic.  Right Ear: Tympanic membrane, external ear and ear canal normal.  Left Ear: Tympanic membrane, external ear and ear canal normal.  Nose: Nose normal. No mucosal edema or rhinorrhea.  Mouth/Throat: Uvula is midline, oropharynx is clear and moist and mucous membranes are normal. No oropharyngeal exudate.  Eyes: Conjunctivae are normal.  Neck: Trachea normal. No tracheal tenderness present. No tracheal deviation present. No thyromegaly present.  Cardiovascular: Normal rate, regular rhythm, S1 normal, S2 normal and normal heart  sounds.   No murmur heard. Pulmonary/Chest: Breath sounds normal. No stridor. No respiratory distress. She has no wheezes. She has no rales.  Musculoskeletal: She exhibits no edema.  Lymphadenopathy:       Head (right side): No tonsillar adenopathy present.       Head (left side): No tonsillar adenopathy present.    She has no cervical adenopathy.  Neurological: She is alert. Gait normal.  Skin: No rash noted. She is not diaphoretic. No erythema. Nails show no clubbing.  Psychiatric: Mood and affect normal.    Diagnostics:    Spirometry was performed and demonstrated an FEV1 of 1.83 at 91 % of predicted.  Assessment and Plan:   1. Asthma, moderate persistent, well-controlled  2. Other allergic rhinitis   3. LPRD (laryngopharyngeal reflux disease)     1. Symbicort 160 two inhalations 1-2 times per day depending on asthma activity  2. "Action plan" for asthma flare:   A. Qvar 80 3 inhalations 3 times per day with spacer  B. use Proventil HFA 2 puffs every 4-6 hours if needed  C. Increase Symbicort to two inhalations two times per day  3. Flonase 1 spray each nostril one -two times a day during periods of upper airway symptoms  4. Zyrtec 10 mg one tablet one time per day if needed  5. Ranitidine 300 mg daily.    6. Decrease caffeine consumption: aim for none  7. Return to clinic in 6 months or earlier if problem  Although while using her medications in a relatively unconventional manner this appears to be working for [employer] quite well. We'll continue her on a plan of using anti-inflammatory agents for both her upper and lower airways and addressing the issue with her reflux on a consistent basis. I'll see her back in this clinic in approximately 6 months or earlier if there is a problem.  Allena Katz, MD [place]

## 2016-09-27 NOTE — Patient Instructions (Addendum)
  1. Symbicort 160 two inhalations 1-2 times per day depending on asthma activity  2. "Action plan" for asthma flare:   A. Qvar 80 3 inhalations 3 times per day with spacer  B. use Proventil HFA 2 puffs every 4-6 hours if needed  C. Increase Symbicort to two inhalations two times per day  3. Flonase 1 spray each nostril one -two times a day during periods of upper airway symptoms  4. Zyrtec 10 mg one tablet one time per day if needed  5. Ranitidine 300 mg daily.    6. Decrease caffeine consumption: aim for none  7. Return to clinic in 6 months or earlier if problem

## 2016-10-03 ENCOUNTER — Other Ambulatory Visit: Payer: Self-pay | Admitting: Interventional Cardiology

## 2016-10-11 MED FILL — TRUEplus LANCETS 30G MISC: 90 days supply | Qty: 100 | Fill #1

## 2016-10-11 MED FILL — raNITIdine HCL 300 MG TABS: 300 | 90 days supply | Qty: 90 | Fill #0

## 2016-10-11 MED FILL — TRUE METRIX GLUCOSE TEST ST: 90 days supply | Qty: 100 | Fill #1

## 2016-10-11 MED FILL — EZETIMIBE 10 MG TABLET: 10 | 90 days supply | Qty: 90 | Fill #0

## 2016-11-10 MED FILL — VITAMIN D 2,000 UNIT SOFTGE: 50 MCG | 90 days supply | Qty: 90 | Fill #1

## 2016-11-11 DIAGNOSIS — M65319 Trigger thumb, unspecified thumb: Secondary | ICD-10-CM | POA: Diagnosis not present

## 2016-11-11 MED FILL — CERTAVITE-ANTIOXIDANT TAB: 90 days supply | Qty: 130 | Fill #0

## 2016-11-18 DIAGNOSIS — H2513 Age-related nuclear cataract, bilateral: Secondary | ICD-10-CM | POA: Diagnosis not present

## 2016-11-18 DIAGNOSIS — H34211 Partial retinal artery occlusion, right eye: Secondary | ICD-10-CM | POA: Diagnosis not present

## 2016-12-15 ENCOUNTER — Other Ambulatory Visit: Payer: Self-pay | Admitting: Interventional Cardiology

## 2016-12-15 MED FILL — METOPROLOL TARTRATE 25 MG T: 25 | 90 days supply | Qty: 180 | Fill #1

## 2016-12-15 MED FILL — metFORMIN HCL 500 MG TABS: 500 | 90 days supply | Qty: 90 | Fill #3

## 2016-12-15 MED FILL — ROSUVASTATIN CALCIUM 40 MG: 40 | 90 days supply | Qty: 90 | Fill #0

## 2017-01-05 MED FILL — EZETIMIBE 10 MG TABLET: 10 | 90 days supply | Qty: 90 | Fill #1

## 2017-01-05 MED FILL — MAGNESIUM OXIDE 500 MG TAB: 500 | 100 days supply | Qty: 100 | Fill #1

## 2017-01-05 MED FILL — raNITIdine HCL 300 MG TABS: 300 | 90 days supply | Qty: 90 | Fill #1

## 2017-01-17 ENCOUNTER — Telehealth: Payer: Self-pay | Admitting: Allergy and Immunology

## 2017-01-17 NOTE — Telephone Encounter (Signed)
which inhaler does she use currently becasue she is having a flare up from cleaning.

## 2017-01-17 NOTE — Telephone Encounter (Signed)
Informed patient to increase Symbicort 2 puff twice daily and to use Qvar 80 - 3 puffs three times daily per Dr. Neldon Mc action plan. Also to call us with an update in a week.

## 2017-01-18 DIAGNOSIS — J040 Acute laryngitis: Secondary | ICD-10-CM | POA: Diagnosis not present

## 2017-02-03 ENCOUNTER — Other Ambulatory Visit: Payer: Self-pay | Admitting: Allergy and Immunology

## 2017-02-03 ENCOUNTER — Telehealth: Payer: Self-pay | Admitting: Allergy and Immunology

## 2017-02-03 MED ORDER — PREDNISONE 10 MG PO TABS: 30.0000 mg | ORAL_TABLET | Freq: Every day | ORAL | 0 refills | Status: DC

## 2017-02-03 MED FILL — predniSONE 10 MG TABS: 10 | 3 days supply | Qty: 9 | Fill #0

## 2017-02-03 NOTE — Telephone Encounter (Signed)
Please ensure she is doing her asthma action plan per Dr. Neldon Mc with symbicort and Qvar.    She should use her albuterol 2 puffs every 4 hours for next several days.  Please send prednisone 30mg  x 3 days.    She can continue use tessalon perles.   She should let us know how she is doing next week.   If she is needed to use albuterol more than every 4 hours or she is having difficulty breathing she should be evaluted in Urgent care or ED over weekend.

## 2017-02-03 NOTE — Telephone Encounter (Signed)
Advised pt. Prednisone sent to pharmacy. Advised pt to call next week to let us know how she is doing.

## 2017-02-03 NOTE — Telephone Encounter (Signed)
Pt states she feels very weak and can not stop coughing. I asked her if she had a cold, and she didn't think so. She has been using her inhalers as prescribed. She has also been taking tessalon pearls with no relief. She would like to know what else she can take for her cough. Please advise.

## 2017-02-03 NOTE — Telephone Encounter (Signed)
patient cant stop coughing - what can she take in addition to her current medications??

## 2017-02-05 ENCOUNTER — Ambulatory Visit (INDEPENDENT_AMBULATORY_CARE_PROVIDER_SITE_OTHER): Payer: 59

## 2017-02-05 ENCOUNTER — Ambulatory Visit (HOSPITAL_COMMUNITY)
Admission: EM | Admit: 2017-02-05 | Discharge: 2017-02-05 | Disposition: A | Discharge status: Home / Self Care | Payer: 59 | Attending: Emergency Medicine | Admitting: Emergency Medicine

## 2017-02-05 ENCOUNTER — Encounter (HOSPITAL_COMMUNITY): Payer: Self-pay | Admitting: Emergency Medicine

## 2017-02-05 DIAGNOSIS — J45901 Unspecified asthma with (acute) exacerbation: Secondary | ICD-10-CM

## 2017-02-05 DIAGNOSIS — R05 Cough: Secondary | ICD-10-CM

## 2017-02-05 MED ORDER — IPRATROPIUM-ALBUTEROL 0.5-2.5 (3) MG/3ML IN SOLN
3.0000 mL | Freq: Once | RESPIRATORY_TRACT | Status: AC
  Administered 2017-02-05: 3 mL via RESPIRATORY_TRACT

## 2017-02-05 MED ORDER — DOXYCYCLINE HYCLATE 100 MG PO CAPS: 100.0000 mg | ORAL_CAPSULE | Freq: Two times a day (BID) | ORAL | 0 refills | Status: DC

## 2017-02-05 MED ORDER — IPRATROPIUM-ALBUTEROL 0.5-2.5 (3) MG/3ML IN SOLN
RESPIRATORY_TRACT | Status: AC
  Filled 2017-02-05: qty 3

## 2017-02-05 MED ORDER — SODIUM CHLORIDE 0.9 % IN NEBU
INHALATION_SOLUTION | RESPIRATORY_TRACT | Status: AC
  Filled 2017-02-05: qty 3

## 2017-02-05 NOTE — ED Triage Notes (Signed)
The patient presented to the [facility] with a complaint of a cough x 4 days. The patient stated that her asthma provider prescribed her prednisone and an increase in her inhaler use that has not helped.

## 2017-02-05 NOTE — ED Provider Notes (Signed)
CSN: [id number]     Arrival date & time 02/05/17  1210 History   First MD Initiated Contact with Patient 02/05/17 1311     Chief Complaint  Patient presents with  . Cough   (Consider location/radiation/quality/duration/timing/severity/associated sxs/prior Treatment) HPI 66 year old white female with history of asthma comes in with the above complaint of cough. Cuff started a few days ago and her primary care physician called her in a prednisone taper. States that she has not really had any improvement. She is also been using her inhalers along delsym and zyrtec. Productive cough with clear sputum. Hasn't having some postnasal drainage which is also clear. Some shortness of breath and wheezing. States that cough is worse at night when she is lying down. Denies chest pain, sore throat, fever/chills, sinus pain. Denies calf pain or leg swelling.  Patient asked if this could be the flu. Past Medical History:  Diagnosis Date  . Abnormal finding on Pap smear, ASCUS   . Anemia   . Asthma   . Chondromalacia of knee   . Coronary artery disease   . Diabetes mellitus without complication ([place])   . Diverticulum 12/11/2002   in cecum  . Fibroids    h/o  . Glucose intolerance (impaired glucose tolerance)   . H/O elevated lipids   . H/O tinea cruris 02/2010  . History of ASCVD    s/p CABG  . Hypercholesteremia   . Hypertension   . Internal hemorrhoids    h/o  . Irregular menses    h/o  . MI (myocardial infarction) age 41  . Obesity   . Pneumonia 07/2008  . Synovitis of knee   . Systemic lupus erythematosus ([place])    with nephritis  . Vulvitis    h/o   Past Surgical History:  Procedure Laterality Date  . CESAREAN SECTION    . CORONARY ARTERY BYPASS GRAFT    . Endometrial curettage  03/29/2000  . ROTATOR CUFF REPAIR    . TUBAL LIGATION    . UPPER GASTROINTESTINAL ENDOSCOPY  12/11/2002   Family History  Problem Relation Age of Onset  . Breast cancer Mother   . Diabetes Mother   . Stroke  Mother   . Prostate cancer Father   . Diabetes Father   . Hyperlipidemia Father   . Heart attack Paternal Uncle     2 paternal uncles  . Ovarian cancer Cousin    Social History  Substance Use Topics  . Smoking status: Former Research scientist (life sciences)  . Smokeless tobacco: Never Used  . Alcohol use Yes     Comment: occasional   OB History    Gravida Para Term Preterm AB Living   3 3       4    SAB TAB Ectopic Multiple Live Births         1       Review of Systems  Constitutional: Negative for activity change, chills and fever.  HENT: Positive for postnasal drip. Negative for ear pain, sinus pain, sinus pressure, sneezing, sore throat and trouble swallowing.   Eyes: Negative for discharge and itching.  Respiratory: Positive for cough, shortness of breath and wheezing. Negative for choking.   Cardiovascular: Negative for chest pain and leg swelling.  Gastrointestinal: Negative for abdominal pain, nausea and vomiting.  Genitourinary: Negative.   Psychiatric/Behavioral: Negative.     Allergies  Biaxin [clarithromycin]; Cardiolite [technetium-51m]; Ciprofloxacin; Erythromycin base; Montelukast sodium; Niaspan [niacin er]; Penicillins; Prilosec [omeprazole]; Singulair [montelukast]; and Ticlid [ticlopidine hcl]  Home Medications  Prior to Admission medications   Medication Sig Start Date End Date Taking? Authorizing Provider  aspirin 81 MG tablet Take 81 mg by mouth daily.    Historical Provider, MD  benzonatate (TESSALON) 100 MG capsule Take 1-2 capsules (100-200 mg total) by mouth 3 (three) times daily as needed for cough. 08/31/16   Dorian Heckle English, PA  budesonide-formoterol (SYMBICORT) 160-4.5 MCG/ACT inhaler Inhale 2 puffs into the lungs 2 (two) times daily. 02/03/16   Jiles Prows, MD  cetirizine (ZYRTEC) 10 MG tablet Take 10 mg by mouth daily.    Historical Provider, MD  Cholecalciferol (VITAMIN D3) 2000 UNITS TABS Take 1 tablet by mouth daily.    Historical Provider, MD   Dextromethorphan-Guaifenesin [facility] DM PO) Take by mouth as needed. Reported on 03/29/2016    Historical Provider, MD  doxycycline (VIBRAMYCIN) 100 MG capsule Take 1 capsule (100 mg total) by mouth 2 (two) times daily. 02/05/17   Lanae Crumbly, PA-C  ezetimibe (ZETIA) 10 MG tablet Take 1 tablet (10 mg total) by mouth daily. 12/15/16   Belva Crome, MD  fluconazole (DIFLUCAN) 150 MG tablet Take 150 mg by mouth as needed (itching/ yeast infections after antibiotic use).  01/01/16   Historical Provider, MD  fluticasone (FLONASE) 50 MCG/ACT nasal spray Place 2 sprays into the nose daily as needed. Nasal congestion    Historical Provider, MD  Magnesium Oxide 500 MG TABS  11/26/15   Historical Provider, MD  metFORMIN (GLUCOPHAGE) 500 MG tablet Take 250 mg by mouth daily.    Historical Provider, MD  metoprolol tartrate (LOPRESSOR) 25 MG tablet TAKE 1 TABLET BY MOUTH TWICE DAILY 09/01/16   Belva Crome, MD  Multiple Vitamins-Minerals (MULTIVITAL PO) Take 1 tablet by mouth daily.    Historical Provider, MD  nitroGLYCERIN (NITROSTAT) 0.4 MG SL tablet Place 0.4 mg under the tongue every 5 (five) minutes as needed for chest pain (3 doses MAX). Reported on 12/01/2015    Historical Provider, MD  predniSONE (DELTASONE) 10 MG tablet Take 3 tablets (30 mg total) by mouth daily with breakfast. 02/03/17   Shaylar Charmian Muff, MD  Probiotic Product ([place] PO) Take 1 tablet by mouth at bedtime.    Historical Provider, MD  QVAR 80 MCG/ACT inhaler INHALE 2 PUFFS DAILY TO PREVENT COUGH/WHEEZE. RINSE, GARGLE, AND SPIT AFTER USE (3 PUFFS 3 X DAILY WITH FLARE UPS) 03/31/16   Jiles Prows, MD  ranitidine (ZANTAC) 300 MG tablet TAKE 1 TABLET BY MOUTH EACH EVENING DURING FLARE UP AS DIRECTED 09/27/16   Jiles Prows, MD  rosuvastatin (CRESTOR) 40 MG tablet TAKE 1 TABLET BY MOUTH DAILY. 12/15/16   Belva Crome, MD  sodium chloride (OCEAN) 0.65 % SOLN nasal spray Place 1 spray into both nostrils as needed for  congestion.    Historical Provider, MD  TRUE METRIX BLOOD GLUCOSE TEST test strip  12/25/15   Historical Provider, MD  TRUEPLUS LANCETS 30G MISC  12/25/15   Historical Provider, MD  valACYclovir (VALTREX) 500 MG tablet Take 500 mg by mouth daily as needed (cold sores/blisters). Reported on 12/01/2015    Historical Provider, MD  VENTOLIN HFA 108 (90 Base) MCG/ACT inhaler INHALE 2 PUFFS BY MOUTH EVERY 4-6 HOURS AS NEEDED FOR COUGH OR WHEEZE 03/31/16   Jiles Prows, MD   Meds Ordered and Administered this Visit   Medications  ipratropium-albuterol (DUONEB) 0.5-2.5 (3) MG/3ML nebulizer solution 3 mL (3 mLs Nebulization Given 02/05/17 1359)    BP Marland Kitchen)  147/65 (BP Location: Right Arm)   Pulse 96   Temp 99.4 F (37.4 C) (Oral)   Resp 18   SpO2 98%  No data found.   Physical Exam  Constitutional: She is oriented to person, place, and time. She appears well-developed and well-nourished. No distress.  HENT:  Head: Normocephalic and atraumatic.  Right Ear: External ear normal.  Left Ear: External ear normal.  Mouth/Throat: Oropharynx is clear and moist. No oropharyngeal exudate.  Eyes: EOM are normal. Pupils are equal, round, and reactive to light.  Neck: Normal range of motion.  Cardiovascular: Normal rate.   Murmur (systolic) heard. Pulmonary/Chest: No respiratory distress. She has wheezes (Few scattered).  Abdominal: There is no tenderness.  Musculoskeletal:  Bilateral calves are nontender. No lower extremity pitting edema. Neurovascularly intact.  Lymphadenopathy:    She has no cervical adenopathy.  Neurological: She is alert and oriented to person, place, and time.  Skin: Skin is warm and dry.  Psychiatric: She has a normal mood and affect.    Urgent Care Course     Procedures (including critical care time)  Labs Review Labs Reviewed - No data to display  Imaging Review Dg Chest 2 View  Result Date: 02/05/2017 CLINICAL DATA:  Initial evaluation for acute cough for 5 days.  History of asthma. EXAM: CHEST  2 VIEW COMPARISON:  Prior radiograph from 10/28/2012. FINDINGS: Median sternotomy wires with underlying CABG markers noted. Mild cardiomegaly. Mediastinal silhouette within normal limits. Lungs normally inflated. Scattered peribronchial thickening present. No consolidative airspace disease. No pulmonary edema or pleural effusion. No pneumothorax. No acute osseus abnormality. IMPRESSION: 1. Scattered peribronchial thickening, which may reflect sequelae of acute bronchiolitis and/or reactive airways disease. No radiographic evidence for alveolar pneumonia. 2. Stable mild cardiomegaly with sequelae of prior CABG. Electronically Signed   By: Jeannine Boga M.D.   On: 02/05/2017 13:30     Visual Acuity Review  Right Eye Distance:   Left Eye Distance:   Bilateral Distance:    Right Eye Near:   Left Eye Near:    Bilateral Near:         MDM   1. Moderate asthma with exacerbation, unspecified whether persistent    Patient was given a breathing treatment today in the clinic. States that she was feeling better after. She'll continue all of her current inhalers, cough and allergy medication. Was given a prescription for doxycycline 100 mg po bid 7 days.  Recommend she contact her primary care physician Dr. Josetta Huddle tomorrow to schedule follow-up visit. If symptoms worsen she understands to go immediately to the emergency room for further evaluation. Case discussed with my attending Dr Melynda Ripple.     Lanae Crumbly, PA-C 02/05/17 1435

## 2017-02-05 NOTE — ED Notes (Signed)
Bed: UC01 Expected date:  Expected time:  Means of arrival:  Comments: 

## 2017-02-05 NOTE — Discharge Instructions (Signed)
Continue using her inhalers.  If you begin to have worsening cough shortness of breath or wheezing you should go immediately to the emergency room for further evaluation.

## 2017-02-06 DIAGNOSIS — J45901 Unspecified asthma with (acute) exacerbation: Secondary | ICD-10-CM | POA: Diagnosis not present

## 2017-02-06 MED FILL — predniSONE 10 MG (21) TBPK: 10 | 6 days supply | Qty: 21 | Fill #0

## 2017-02-09 MED FILL — ACCU-CHEK FASTCLIX LANCETS: 90 days supply | Qty: 204 | Fill #0

## 2017-02-10 MED FILL — VITAMIN D 2,000 UNIT SOFTGE: 50 MCG | 90 days supply | Qty: 90 | Fill #2

## 2017-02-13 MED FILL — SYMBICORT 160-4.5 MCG INH: 160-4.5 | 30 days supply | Qty: 10 | Fill #0

## 2017-02-14 MED FILL — ACCU-CHEK GUIDE TEST STRIP: 90 days supply | Qty: 100 | Fill #0

## 2017-02-15 DIAGNOSIS — H6121 Impacted cerumen, right ear: Secondary | ICD-10-CM | POA: Diagnosis not present

## 2017-02-15 DIAGNOSIS — J040 Acute laryngitis: Secondary | ICD-10-CM | POA: Diagnosis not present

## 2017-02-15 DIAGNOSIS — H9201 Otalgia, right ear: Secondary | ICD-10-CM | POA: Diagnosis not present

## 2017-02-15 MED FILL — AZITHROMYCIN 250 MG TAB: 250 | 5 days supply | Qty: 6 | Fill #0

## 2017-02-21 MED FILL — FLUCONAZOLE 150 MG TABLET: 150 | 9 days supply | Qty: 3 | Fill #0

## 2017-02-23 DIAGNOSIS — I2510 Atherosclerotic heart disease of native coronary artery without angina pectoris: Secondary | ICD-10-CM | POA: Diagnosis not present

## 2017-02-23 DIAGNOSIS — E785 Hyperlipidemia, unspecified: Secondary | ICD-10-CM | POA: Diagnosis not present

## 2017-02-23 DIAGNOSIS — H6121 Impacted cerumen, right ear: Secondary | ICD-10-CM | POA: Diagnosis not present

## 2017-02-23 DIAGNOSIS — H9201 Otalgia, right ear: Secondary | ICD-10-CM | POA: Diagnosis not present

## 2017-02-23 DIAGNOSIS — E119 Type 2 diabetes mellitus without complications: Secondary | ICD-10-CM | POA: Diagnosis not present

## 2017-02-23 DIAGNOSIS — H349 Unspecified retinal vascular occlusion: Secondary | ICD-10-CM | POA: Diagnosis not present

## 2017-02-23 DIAGNOSIS — D509 Iron deficiency anemia, unspecified: Secondary | ICD-10-CM | POA: Diagnosis not present

## 2017-02-23 DIAGNOSIS — E559 Vitamin D deficiency, unspecified: Secondary | ICD-10-CM | POA: Diagnosis not present

## 2017-03-15 MED FILL — METFORMIN HCL ER 500 MG TAB: 500 | 90 days supply | Qty: 180 | Fill #0

## 2017-03-17 MED FILL — ROSUVASTATIN CALCIUM 40 MG: 40 | 90 days supply | Qty: 90 | Fill #1

## 2017-03-20 DIAGNOSIS — H2513 Age-related nuclear cataract, bilateral: Secondary | ICD-10-CM | POA: Diagnosis not present

## 2017-03-20 DIAGNOSIS — H34211 Partial retinal artery occlusion, right eye: Secondary | ICD-10-CM | POA: Diagnosis not present

## 2017-03-22 ENCOUNTER — Telehealth: Payer: Self-pay | Admitting: Interventional Cardiology

## 2017-03-22 DIAGNOSIS — H6121 Impacted cerumen, right ear: Secondary | ICD-10-CM | POA: Diagnosis not present

## 2017-03-22 DIAGNOSIS — J9801 Acute bronchospasm: Secondary | ICD-10-CM | POA: Diagnosis not present

## 2017-03-22 DIAGNOSIS — J069 Acute upper respiratory infection, unspecified: Secondary | ICD-10-CM | POA: Diagnosis not present

## 2017-03-22 DIAGNOSIS — E785 Hyperlipidemia, unspecified: Secondary | ICD-10-CM | POA: Diagnosis not present

## 2017-03-22 DIAGNOSIS — E119 Type 2 diabetes mellitus without complications: Secondary | ICD-10-CM | POA: Diagnosis not present

## 2017-03-22 DIAGNOSIS — H349 Unspecified retinal vascular occlusion: Secondary | ICD-10-CM | POA: Diagnosis not present

## 2017-03-22 DIAGNOSIS — Z7984 Long term (current) use of oral hypoglycemic drugs: Secondary | ICD-10-CM | POA: Diagnosis not present

## 2017-03-22 DIAGNOSIS — I2510 Atherosclerotic heart disease of native coronary artery without angina pectoris: Secondary | ICD-10-CM | POA: Diagnosis not present

## 2017-03-24 ENCOUNTER — Ambulatory Visit (HOSPITAL_COMMUNITY)
Admission: RE | Admit: 2017-03-24 | Discharge: 2017-03-24 | Disposition: A | Discharge status: Home / Self Care | Payer: 59 | Source: Ambulatory Visit | Attending: Cardiovascular Disease | Admitting: Cardiovascular Disease

## 2017-03-24 ENCOUNTER — Other Ambulatory Visit (HOSPITAL_COMMUNITY): Payer: Self-pay | Admitting: Internal Medicine

## 2017-03-24 DIAGNOSIS — R0989 Other specified symptoms and signs involving the circulatory and respiratory systems: Secondary | ICD-10-CM

## 2017-03-24 DIAGNOSIS — H349 Unspecified retinal vascular occlusion: Secondary | ICD-10-CM | POA: Diagnosis not present

## 2017-03-24 DIAGNOSIS — I6523 Occlusion and stenosis of bilateral carotid arteries: Secondary | ICD-10-CM | POA: Insufficient documentation

## 2017-03-28 DIAGNOSIS — H2513 Age-related nuclear cataract, bilateral: Secondary | ICD-10-CM | POA: Diagnosis not present

## 2017-03-28 DIAGNOSIS — H34211 Partial retinal artery occlusion, right eye: Secondary | ICD-10-CM | POA: Diagnosis not present

## 2017-04-06 ENCOUNTER — Encounter (HOSPITAL_COMMUNITY): Payer: 59

## 2017-04-07 MED FILL — EZETIMIBE 10 MG TABLET: 10 | 90 days supply | Qty: 90 | Fill #2

## 2017-04-13 MED FILL — METOPROLOL TARTRATE 25 MG T: 25 | 90 days supply | Qty: 180 | Fill #2

## 2017-04-17 ENCOUNTER — Other Ambulatory Visit: Payer: Self-pay | Admitting: Allergy and Immunology

## 2017-04-17 MED FILL — VALACYCLOVIR HCL 500 MG TAB: 500 | 30 days supply | Qty: 60 | Fill #0

## 2017-04-18 MED ORDER — RANITIDINE HCL 150 MG PO TABS: 150.0000 mg | ORAL_TABLET | Freq: Two times a day (BID) | ORAL | 0 refills | Status: DC

## 2017-04-24 DIAGNOSIS — H2513 Age-related nuclear cataract, bilateral: Secondary | ICD-10-CM | POA: Diagnosis not present

## 2017-04-24 DIAGNOSIS — H34211 Partial retinal artery occlusion, right eye: Secondary | ICD-10-CM | POA: Diagnosis not present

## 2017-04-27 MED FILL — raNITIdine HCL 150 MG TABS: 150 | 30 days supply | Qty: 60 | Fill #0

## 2017-04-27 MED FILL — MAGNESIUM OXIDE 500 MG TAB: 500 | 100 days supply | Qty: 100 | Fill #2

## 2017-05-01 MED FILL — ACCU-CHEK GUIDE TEST STRIP: 90 days supply | Qty: 200 | Fill #0

## 2017-05-01 MED FILL — ACCU-CHEK FASTCLIX LANCETS: 90 days supply | Qty: 200 | Fill #0

## 2017-05-04 MED FILL — VITAMIN D 2,000 UNIT SOFTGE: 50 MCG | 90 days supply | Qty: 90 | Fill #3

## 2017-05-23 ENCOUNTER — Ambulatory Visit (INDEPENDENT_AMBULATORY_CARE_PROVIDER_SITE_OTHER): Payer: 59 | Admitting: Allergy and Immunology

## 2017-05-23 ENCOUNTER — Encounter: Payer: Self-pay | Admitting: Allergy and Immunology

## 2017-05-23 VITALS — BP 102/64 | HR 68 | Resp 18

## 2017-05-23 DIAGNOSIS — K219 Gastro-esophageal reflux disease without esophagitis: Secondary | ICD-10-CM

## 2017-05-23 DIAGNOSIS — I2510 Atherosclerotic heart disease of native coronary artery without angina pectoris: Secondary | ICD-10-CM | POA: Diagnosis not present

## 2017-05-23 DIAGNOSIS — J3089 Other allergic rhinitis: Secondary | ICD-10-CM

## 2017-05-23 DIAGNOSIS — M65311 Trigger thumb, right thumb: Secondary | ICD-10-CM | POA: Diagnosis not present

## 2017-05-23 DIAGNOSIS — H349 Unspecified retinal vascular occlusion: Secondary | ICD-10-CM | POA: Diagnosis not present

## 2017-05-23 DIAGNOSIS — J4540 Moderate persistent asthma, uncomplicated: Secondary | ICD-10-CM

## 2017-05-23 DIAGNOSIS — E785 Hyperlipidemia, unspecified: Secondary | ICD-10-CM | POA: Diagnosis not present

## 2017-05-23 DIAGNOSIS — M79673 Pain in unspecified foot: Secondary | ICD-10-CM | POA: Diagnosis not present

## 2017-05-23 DIAGNOSIS — E119 Type 2 diabetes mellitus without complications: Secondary | ICD-10-CM | POA: Diagnosis not present

## 2017-05-23 MED ORDER — BECLOMETHASONE DIPROP HFA 80 MCG/ACT IN AERB
INHALATION_SPRAY | RESPIRATORY_TRACT | 3 refills | Status: DC
Start: 2017-05-23 — End: 2018-07-24

## 2017-05-23 MED ORDER — RANITIDINE HCL 150 MG PO TABS: 150.0000 mg | ORAL_TABLET | Freq: Two times a day (BID) | ORAL | 1 refills | Status: DC

## 2017-05-23 NOTE — Patient Instructions (Addendum)
  1. Symbicort 160 two inhalations 2 inhalations 2 times per day    2. "Action plan" for asthma flare:   A. Qvar 80 REDIHALER 3 inhalations 3 times per day  B. use Proventil HFA 2 puffs every 4-6 hours if needed  3. Flonase 1 spray each nostril one -two times a day during periods of upper airway symptoms  4. Zyrtec 10 mg one tablet one time per day if needed  5. Continue Ranitidine 150 two times per day.    6. Decrease caffeine consumption: aim for none  7. Return to clinic in 6 months or earlier if problem  8. Obtain fall flu vaccine

## 2017-05-23 NOTE — Progress Notes (Signed)
Follow-up Note  Referring Provider: Josetta Huddle, MD Primary Provider: Josetta Huddle, MD Date of Office Visit: 05/23/2017  Subjective:   Deanna Simmons (DOB: April 10, 1951) is a 66 y.o. female who returns to the [facility] on 05/23/2017 in re-evaluation of the following:  HPI: Adesuwa presents to this clinic in evaluation of asthma and allergic rhinitis and LPR. Her last visit to this clinic was November 2017.  She did quite well until March 2018. At that point in time she used a spray cleaner to clean out her oven and within minutes she developed wheezing and coughing that she could not get under control that required the administration of systemic steroids and evaluation in the urgent care center.  After about 2 weeks she finally did resolve that issue and has been doing very well since that point. Ever since that issue she has been using a combination of Symbicort and Qvar 2 inhalations each twice a day. She does not need to use a short acting bronchodilator. She can exert herself without any problem.  She ahs had very little problem with her nose while using a nasal steroid.  Her reflux is doing okay. She has split her ranitidine into 150 mg twice a day. She still continues to drink at least 1 or 2 cups of coffee per day.  Allergies as of 05/23/2017      Reactions   Biaxin [clarithromycin] Itching   Cardiolite [technetium-31m] Itching   Ciprofloxacin Itching   Erythromycin Base Itching   Montelukast Sodium Itching   Niaspan [niacin Er] Other (See Comments)   flushing   Penicillins Itching   Prilosec [omeprazole] Itching   Singulair [montelukast]    Ticlid [ticlopidine Hcl] Itching      Medication List      aspirin 81 MG tablet Take 81 mg by mouth daily.   cetirizine 10 MG tablet Commonly known as:  ZYRTEC Take 10 mg by mouth daily.   ezetimibe 10 MG tablet Commonly known as:  ZETIA Take 1 tablet (10 mg total) by mouth daily.   fluticasone 50  MCG/ACT nasal spray Commonly known as:  FLONASE Place 2 sprays into the nose daily as needed. Nasal congestion   Magnesium Oxide 500 MG Tabs   metFORMIN 500 MG tablet Commonly known as:  GLUCOPHAGE Take 250 mg by mouth daily.   metoprolol tartrate 25 MG tablet Commonly known as:  LOPRESSOR TAKE 1 TABLET BY MOUTH TWICE DAILY   MUCINEX DM PO Take by mouth as needed. Reported on 03/29/2016   MULTIVITAL PO Take 1 tablet by mouth daily.   nitroGLYCERIN 0.4 MG SL tablet Commonly known as:  NITROSTAT Place 0.4 mg under the tongue every 5 (five) minutes as needed for chest pain (3 doses MAX). Reported on 12/01/2015   QVAR 80 MCG/ACT inhaler Generic drug:  beclomethasone INHALE 2 PUFFS DAILY TO PREVENT COUGH/WHEEZE. RINSE, GARGLE, AND SPIT AFTER USE (3 PUFFS 3 X DAILY WITH FLARE UPS)   ranitidine 150 MG tablet Commonly known as:  ZANTAC Take 1 tablet (150 mg total) by mouth 2 (two) times daily.   rosuvastatin 40 MG tablet Commonly known as:  CRESTOR TAKE 1 TABLET BY MOUTH DAILY.   sodium chloride 0.65 % Soln nasal spray Commonly known as:  OCEAN Place 1 spray into both nostrils as needed for congestion.   SYMBICORT 160-4.5 MCG/ACT inhaler Generic drug:  budesonide-formoterol INHALE 2 PUFFS INTO THE LUNGS 2 TIMES DAILY.   TRUE METRIX BLOOD GLUCOSE TEST  test strip Generic drug:  glucose blood   TRUEPLUS LANCETS 30G Misc   ULTRAFLORA IMMUNE HEALTH PO Take 1 tablet by mouth at bedtime.   valACYclovir 500 MG tablet Commonly known as:  VALTREX Take 500 mg by mouth daily as needed (cold sores/blisters). Reported on 12/01/2015   VENTOLIN HFA 108 (90 Base) MCG/ACT inhaler Generic drug:  albuterol INHALE 2 PUFFS BY MOUTH EVERY 4-6 HOURS AS NEEDED FOR COUGH OR WHEEZE   Vitamin D3 2000 units Tabs Take 1 tablet by mouth daily.       Past Medical History:  Diagnosis Date  . Abnormal finding on Pap smear, ASCUS   . Anemia   . Asthma   . Chondromalacia of knee   . Coronary  artery disease   . Diabetes mellitus without complication ([place])   . Diverticulum 12/11/2002   in cecum  . Fibroids    h/o  . Glucose intolerance (impaired glucose tolerance)   . H/O elevated lipids   . H/O tinea cruris 02/2010  . History of ASCVD    s/p CABG  . Hypercholesteremia   . Hypertension   . Internal hemorrhoids    h/o  . Irregular menses    h/o  . MI (myocardial infarction) ([place]) age 65  . Obesity   . Pneumonia 07/2008  . Synovitis of knee   . Systemic lupus erythematosus ([place])    with nephritis  . Vulvitis    h/o    Past Surgical History:  Procedure Laterality Date  . CESAREAN SECTION    . CORONARY ARTERY BYPASS GRAFT    . Endometrial curettage  03/29/2000  . ROTATOR CUFF REPAIR    . TUBAL LIGATION    . UPPER GASTROINTESTINAL ENDOSCOPY  12/11/2002    Review of systems negative except as noted in HPI / PMHx or noted below:  Review of Systems  Constitutional: Negative.   HENT: Negative.   Eyes: Negative.   Respiratory: Negative.   Cardiovascular: Negative.   Gastrointestinal: Negative.   Genitourinary: Negative.   Musculoskeletal: Negative.   Skin: Negative.   Neurological: Negative.   Endo/Heme/Allergies: Negative.   Psychiatric/Behavioral: Negative.      Objective:   Vitals:   05/23/17 1714  BP: 102/64  Pulse: 68  Resp: 18          Physical Exam  Constitutional: She is well-developed, well-nourished, and in no distress.  HENT:  Head: Normocephalic. Head is without right periorbital erythema and without left periorbital erythema.  Right Ear: Tympanic membrane, external ear and ear canal normal.  Left Ear: Tympanic membrane, external ear and ear canal normal.  Nose: Nose normal. No mucosal edema or rhinorrhea.  Mouth/Throat: Uvula is midline, oropharynx is clear and moist and mucous membranes are normal. No oropharyngeal exudate.  Eyes: Pupils are equal, round, and reactive to light. Conjunctivae and lids are normal.  Neck: Trachea  normal. No tracheal tenderness present. No tracheal deviation present. No thyromegaly present.  Cardiovascular: Normal rate, regular rhythm, S1 normal and S2 normal.   Murmur (systolic) heard. Pulmonary/Chest: Effort normal and breath sounds normal. No stridor. No tachypnea. No respiratory distress. She has no wheezes. She has no rales. She exhibits no tenderness.  Abdominal: Soft. She exhibits no distension and no mass. There is no hepatosplenomegaly. There is no tenderness. There is no rebound and no guarding.  Musculoskeletal: She exhibits no edema or tenderness.  Lymphadenopathy:       Head (right side): No tonsillar adenopathy present.  Head (left side): No tonsillar adenopathy present.    She has no cervical adenopathy.    She has no axillary adenopathy.  Neurological: She is alert. Gait normal.  Skin: No rash noted. She is not diaphoretic. No erythema. No pallor. Nails show no clubbing.  Psychiatric: Mood and affect normal.    Diagnostics:    Spirometry was performed and demonstrated an FEV1 of 2.0 at 93 % of predicted.  The patient had an Asthma Control Test with the following results: ACT Total Score: 22.    Assessment and Plan:   1. Asthma, moderate persistent, well-controlled   2. Other allergic rhinitis   3. LPRD (laryngopharyngeal reflux disease)     1. Symbicort 160 two inhalations 2 inhalations 2 times per day    2. "Action plan" for asthma flare:   A. Qvar 80 REDIHALER 3 inhalations 3 times per day with spacer  B. use Proventil HFA 2 puffs every 4-6 hours if needed  3. Flonase 1 spray each nostril one -two times a day during periods of upper airway symptoms  4. Zyrtec 10 mg one tablet one time per day if needed  5. Continue Ranitidine 150 two times per day.    6. Decrease caffeine consumption: aim for none  7. Return to clinic in 6 months or earlier if problem  8. Obtain fall flu vaccine  Raeven appears to be doing very well and I'm going to have  her discontinue her Qvar and see if she can maintain good control on her Symbicort only. She will contact me should she develop significant problems with her asthma as she moves forward. Her nose appears to have responded quite well to the use of nasal steroid as has her reflux to the use of an H2 receptor blocker. I will see her back in this clinic in 6 months or earlier if there is a problem.  Allena Katz, MD Allergy / Immunology [place]

## 2017-05-24 MED FILL — raNITIdine HCL 150 MG TABS: 150 | 90 days supply | Qty: 180 | Fill #0

## 2017-06-07 DIAGNOSIS — S81801A Unspecified open wound, right lower leg, initial encounter: Secondary | ICD-10-CM | POA: Diagnosis not present

## 2017-06-07 DIAGNOSIS — L03115 Cellulitis of right lower limb: Secondary | ICD-10-CM | POA: Diagnosis not present

## 2017-06-07 MED FILL — DOXYCYCLINE HYC 100 MG TAB: 100 | 10 days supply | Qty: 20 | Fill #0

## 2017-06-20 MED FILL — METFORMIN HCL ER 500 MG TAB: 500 | 90 days supply | Qty: 180 | Fill #1

## 2017-06-26 DIAGNOSIS — S81801D Unspecified open wound, right lower leg, subsequent encounter: Secondary | ICD-10-CM | POA: Diagnosis not present

## 2017-07-06 DIAGNOSIS — J069 Acute upper respiratory infection, unspecified: Secondary | ICD-10-CM | POA: Diagnosis not present

## 2017-07-10 MED FILL — METOPROLOL TARTRATE 25 MG T: 25 | 90 days supply | Qty: 180 | Fill #3

## 2017-07-10 MED FILL — EZETIMIBE 10 MG TABLET: 10 | 60 days supply | Qty: 60 | Fill #3

## 2017-07-11 DIAGNOSIS — J069 Acute upper respiratory infection, unspecified: Secondary | ICD-10-CM | POA: Diagnosis not present

## 2017-07-11 MED FILL — AZITHROMYCIN 250 MG TAB: 250 | 5 days supply | Qty: 6 | Fill #0

## 2017-07-19 MED FILL — ROSUVASTATIN CALCIUM 40 MG: 40 | 90 days supply | Qty: 90 | Fill #2

## 2017-07-27 ENCOUNTER — Other Ambulatory Visit: Payer: Self-pay | Admitting: Obstetrics and Gynecology

## 2017-07-27 DIAGNOSIS — Z1231 Encounter for screening mammogram for malignant neoplasm of breast: Secondary | ICD-10-CM

## 2017-07-28 ENCOUNTER — Emergency Department: Admission: EM | Admit: 2017-07-28 | Discharge: 2017-07-28 | Payer: 59 | Source: Home / Self Care

## 2017-07-28 DIAGNOSIS — S61412A Laceration without foreign body of left hand, initial encounter: Secondary | ICD-10-CM | POA: Diagnosis not present

## 2017-08-01 NOTE — Progress Notes (Signed)
Cardiology Office Note    Date:  08/02/2017   ID:  PATRICK SOHM, DOB November 11, 1950, MRN [id number]  PCP:  Josetta Huddle, MD  Cardiologist: Sinclair Grooms, MD   Chief Complaint  Patient presents with  . Coronary Artery Disease    History of Present Illness:  Deanna Simmons is a 66 y.o. female with CAD, prior coronary bypass grafting, essential hypertension, asthma, diabetes mellitus, recent cryptogenic stroke manifesting as right retinal embolus, moderate aortic stenosis, morbid obesity, and suspicion for sleep apnea.  All he is doing well. She is not having chest discomfort or dyspnea. Overall she feels well. Has difficulty with exercise because of bilateral knee ailment. She has not needed nitroglycerin. She has no orthopnea.   Past Medical History:  Diagnosis Date  . Abnormal finding on Pap smear, ASCUS   . Anemia   . Asthma   . Chondromalacia of knee   . Coronary artery disease   . Diabetes mellitus without complication ([place])   . Diverticulum 12/11/2002   in cecum  . Fibroids    h/o  . Glucose intolerance (impaired glucose tolerance)   . H/O elevated lipids   . H/O tinea cruris 02/2010  . History of ASCVD    s/p CABG  . Hypercholesteremia   . Hypertension   . Internal hemorrhoids    h/o  . Irregular menses    h/o  . MI (myocardial infarction) ([place]) age 50  . Obesity   . Pneumonia 07/2008  . Synovitis of knee   . Systemic lupus erythematosus ([place])    with nephritis  . Vulvitis    h/o    Past Surgical History:  Procedure Laterality Date  . CESAREAN SECTION    . CORONARY ARTERY BYPASS GRAFT    . Endometrial curettage  03/29/2000  . ROTATOR CUFF REPAIR    . TUBAL LIGATION    . UPPER GASTROINTESTINAL ENDOSCOPY  12/11/2002    Current Medications: Outpatient Medications Prior to Visit  Medication Sig Dispense Refill  . aspirin 81 MG tablet Take 81 mg by mouth daily.    . beclomethasone (QVAR REDIHALER) 80 MCG/ACT inhaler Take three inhalations three  times daily during flare-up as directed.  Rinse, gargle, and spit after use. 1 Inhaler 3  . benzonatate (TESSALON) 100 MG capsule Take 1-2 capsules (100-200 mg total) by mouth 3 (three) times daily as needed for cough. 40 capsule 0  . cetirizine (ZYRTEC) 10 MG tablet Take 10 mg by mouth daily.    . Cholecalciferol (VITAMIN D3) 2000 UNITS TABS Take 1 tablet by mouth daily.    Marland Kitchen Dextromethorphan-Guaifenesin (MUCINEX DM PO) Take by mouth as needed. Reported on 03/29/2016    . ezetimibe (ZETIA) 10 MG tablet Take 1 tablet (10 mg total) by mouth daily. 30 tablet 7  . fluconazole (DIFLUCAN) 150 MG tablet Take 150 mg by mouth as needed (itching/ yeast infections after antibiotic use).   2  . fluticasone (FLONASE) 50 MCG/ACT nasal spray Place 2 sprays into the nose daily as needed. Nasal congestion    . Magnesium Oxide 500 MG TABS   3  . metoprolol tartrate (LOPRESSOR) 25 MG tablet TAKE 1 TABLET BY MOUTH TWICE DAILY 180 tablet 3  . Multiple Vitamins-Minerals (MULTIVITAL PO) Take 1 tablet by mouth daily.    . nitroGLYCERIN (NITROSTAT) 0.4 MG SL tablet Place 0.4 mg under the tongue every 5 (five) minutes as needed for chest pain (3 doses MAX). Reported on 12/01/2015    .  predniSONE (DELTASONE) 10 MG tablet Take 3 tablets (30 mg total) by mouth daily with breakfast. 9 tablet 0  . Probiotic Product (ULTRAFLORA IMMUNE HEALTH PO) Take 1 tablet by mouth at bedtime.    Marland Kitchen QVAR 80 MCG/ACT inhaler INHALE 2 PUFFS DAILY TO PREVENT COUGH/WHEEZE. RINSE, GARGLE, AND SPIT AFTER USE (3 PUFFS 3 X DAILY WITH FLARE UPS) 26.1 g 5  . ranitidine (ZANTAC) 150 MG tablet Take 1 tablet (150 mg total) by mouth 2 (two) times daily. 180 tablet 1  . rosuvastatin (CRESTOR) 40 MG tablet TAKE 1 TABLET BY MOUTH DAILY. 90 tablet 2  . sodium chloride (OCEAN) 0.65 % SOLN nasal spray Place 1 spray into both nostrils as needed for congestion.    . SYMBICORT 160-4.5 MCG/ACT inhaler INHALE 2 PUFFS INTO THE LUNGS 2 TIMES DAILY. 10.2 g 1  . TRUE METRIX  BLOOD GLUCOSE TEST test strip   3  . TRUEPLUS LANCETS 30G MISC   3  . valACYclovir (VALTREX) 500 MG tablet Take 500 mg by mouth daily as needed (cold sores/blisters). Reported on 12/01/2015    . VENTOLIN HFA 108 (90 Base) MCG/ACT inhaler INHALE 2 PUFFS BY MOUTH EVERY 4-6 HOURS AS NEEDED FOR COUGH OR WHEEZE 18 g 1  . metFORMIN (GLUCOPHAGE) 500 MG tablet Take 250 mg by mouth daily.     No facility-administered medications prior to visit.      Allergies:   Biaxin [clarithromycin]; Cardiolite [technetium-51m]; Ciprofloxacin; Erythromycin base; Montelukast sodium; Niaspan [niacin er]; Penicillins; Prilosec [omeprazole]; Singulair [montelukast]; and Ticlid [ticlopidine hcl]   Social History   Social History  . Marital status: Married    Spouse name: Deanna Simmons  . Number of children: 4  . Years of education: 12+   Occupational History  . CARE MANAGEMENT [employer]   Social History Main Topics  . Smoking status: Former Research scientist (life sciences)  . Smokeless tobacco: Never Used  . Alcohol use Yes     Comment: occasional  . Drug use: No  . Sexual activity: Not Asked   Other Topics Concern  . None   Social History Narrative   Lives with her husband. Their children are grown, 1 lives in [place], 1 in [place], [place],  1 in [place], [place], and one in [place], [place].     Family History:  The patient's family history includes Breast cancer in her mother; Diabetes in her father and mother; Heart attack in her paternal uncle; Hyperlipidemia in her father; Ovarian cancer in her cousin; Prostate cancer in her father; Stroke in her mother.   ROS:   Please see the history of present illness.    Excessive sweating. Bilateral knee discomfort. Otherwise no complaints.  All other systems reviewed and are negative.   PHYSICAL EXAM:   VS:  BP 132/78   Pulse 61   Ht 5\' 5"  (1.651 m)   Wt 249 lb 6.4 oz (113.1 kg)   BMI 41.50 kg/m    GEN: Well nourished, well developed, in no acute distress  HEENT: normal    Neck: no JVD, carotid bruits, or masses Cardiac: RRR. There is 2/6 systolic murmur. No rubs, or gallops,no edema  Respiratory:  clear to auscultation bilaterally, normal work of breathing GI: soft, nontender, nondistended, + BS MS: no deformity or atrophy  Skin: warm and dry, no rash Neuro:  Alert and Oriented x 3, Strength and sensation are intact Psych: euthymic mood, full affect  Wt Readings from Last 3 Encounters:  08/02/17 249 lb 6.4 oz (113.1 kg)  09/06/16 248 lb (112.5 kg)  08/31/16 250 lb (113.4 kg)      Studies/Labs Reviewed:   EKG:  EKG  Normal sinus rhythm. Normal tracing. When compared to prior tracing from 08/02/16, no changes noted.  Recent Labs: No results found for requested labs within last 8760 hours.   Lipid Panel    Component Value Date/Time   CHOL 120 03/11/2014 0841   TRIG 65.0 03/11/2014 0841   HDL 41.00 03/11/2014 0841   CHOLHDL 3 03/11/2014 0841   VLDL 13.0 03/11/2014 0841   LDLCALC 66 03/11/2014 0841    Additional studies/ records that were reviewed today include:  Carotid Doppler evaluation 03/24/17: Heterogeneous plaque, bilaterally. Stable 1-39% bilateral ICA stenosis. Normal subclavian arteries, bilaterally. Patent vertebral arteries with antegrade flow. F/u PRN  Echocardiogram 08/24/16: Study Conclusions  - Left ventricle: The cavity size was mildly dilated. Wall   thickness was increased in a pattern of mild LVH. Systolic   function was normal. The estimated ejection fraction was in the   range of 60% to 65%. The study is not technically sufficient to   allow evaluation of LV diastolic function. - Aortic valve: Stable gradients since 08/2015 when mean was 21   mmHg and peak was 48 mmHg. There was moderate stenosis. There was   mild regurgitation. - Mitral valve: Severely calcified, moderately thickened annulus.   There was mild regurgitation. - Left atrium: The atrium was mildly dilated. - Atrial septum: No defect or patent  foramen ovale was identified. - Pulmonary arteries: PA peak pressure: 32 mm Hg (S).  ASSESSMENT:    1. Coronary artery disease of bypass graft of native heart with stable angina pectoris ([place])   2. Moderate aortic stenosis   3. Essential hypertension   4. Hypercholesteremia   5. Right carotid bruit      PLAN:  In order of problems listed above:  1. She is stable without angina. I encouraged aerobic activity. 2. Clinically, no significant progression. Consider echo next year. 3. Excellent control. 4. Last LDL was 84. Target is 70. I do not believe we need to up with a chest the statin but I having hurt her to cut back on saturated fats, exercise, lose weight. 5. Carotids are widely patent.  Clinical follow-up in one year.  Medication Adjustments/Labs and Tests Ordered: Current medicines are reviewed at length with the patient today.  Concerns regarding medicines are outlined above.  Medication changes, Labs and Tests ordered today are listed in the Patient Instructions below. There are no Patient Instructions on file for this visit.   Signed, Sinclair Grooms, MD  08/02/2017 8:37 AM    [facility] [address], [place], [place]  [address] Phone: [phone]; Fax: [phone]

## 2017-08-02 ENCOUNTER — Encounter: Payer: Self-pay | Admitting: Interventional Cardiology

## 2017-08-02 ENCOUNTER — Encounter (INDEPENDENT_AMBULATORY_CARE_PROVIDER_SITE_OTHER): Payer: Self-pay

## 2017-08-02 ENCOUNTER — Ambulatory Visit (INDEPENDENT_AMBULATORY_CARE_PROVIDER_SITE_OTHER): Payer: 59 | Admitting: Interventional Cardiology

## 2017-08-02 VITALS — BP 132/78 | HR 61 | Ht 65.0 in | Wt 249.4 lb

## 2017-08-02 DIAGNOSIS — E7800 Pure hypercholesterolemia, unspecified: Secondary | ICD-10-CM | POA: Diagnosis not present

## 2017-08-02 DIAGNOSIS — I25708 Atherosclerosis of coronary artery bypass graft(s), unspecified, with other forms of angina pectoris: Secondary | ICD-10-CM

## 2017-08-02 DIAGNOSIS — I10 Essential (primary) hypertension: Secondary | ICD-10-CM | POA: Diagnosis not present

## 2017-08-02 DIAGNOSIS — I350 Nonrheumatic aortic (valve) stenosis: Secondary | ICD-10-CM | POA: Diagnosis not present

## 2017-08-02 DIAGNOSIS — R0989 Other specified symptoms and signs involving the circulatory and respiratory systems: Secondary | ICD-10-CM

## 2017-08-02 NOTE — Patient Instructions (Signed)

## 2017-08-08 DIAGNOSIS — N644 Mastodynia: Secondary | ICD-10-CM | POA: Diagnosis not present

## 2017-08-08 DIAGNOSIS — R921 Mammographic calcification found on diagnostic imaging of breast: Secondary | ICD-10-CM | POA: Diagnosis not present

## 2017-08-16 DIAGNOSIS — N644 Mastodynia: Secondary | ICD-10-CM | POA: Diagnosis not present

## 2017-08-16 MED FILL — ACCU-CHEK GUIDE TEST STRIP: 90 days supply | Qty: 200 | Fill #1

## 2017-08-24 DIAGNOSIS — H34211 Partial retinal artery occlusion, right eye: Secondary | ICD-10-CM | POA: Diagnosis not present

## 2017-08-24 DIAGNOSIS — J029 Acute pharyngitis, unspecified: Secondary | ICD-10-CM | POA: Diagnosis not present

## 2017-08-24 DIAGNOSIS — H2513 Age-related nuclear cataract, bilateral: Secondary | ICD-10-CM | POA: Diagnosis not present

## 2017-08-31 MED FILL — raNITIdine HCL 150 MG TABS: 150 | 90 days supply | Qty: 180 | Fill #1

## 2017-09-05 DIAGNOSIS — Z23 Encounter for immunization: Secondary | ICD-10-CM | POA: Diagnosis not present

## 2017-09-05 DIAGNOSIS — I2510 Atherosclerotic heart disease of native coronary artery without angina pectoris: Secondary | ICD-10-CM | POA: Diagnosis not present

## 2017-09-05 DIAGNOSIS — E785 Hyperlipidemia, unspecified: Secondary | ICD-10-CM | POA: Diagnosis not present

## 2017-09-05 DIAGNOSIS — I38 Endocarditis, valve unspecified: Secondary | ICD-10-CM | POA: Diagnosis not present

## 2017-09-05 DIAGNOSIS — Z0000 Encounter for general adult medical examination without abnormal findings: Secondary | ICD-10-CM | POA: Diagnosis not present

## 2017-09-05 DIAGNOSIS — E119 Type 2 diabetes mellitus without complications: Secondary | ICD-10-CM | POA: Diagnosis not present

## 2017-09-05 DIAGNOSIS — H349 Unspecified retinal vascular occlusion: Secondary | ICD-10-CM | POA: Diagnosis not present

## 2017-09-05 DIAGNOSIS — I350 Nonrheumatic aortic (valve) stenosis: Secondary | ICD-10-CM | POA: Diagnosis not present

## 2017-09-05 DIAGNOSIS — R252 Cramp and spasm: Secondary | ICD-10-CM | POA: Diagnosis not present

## 2017-09-05 DIAGNOSIS — Z79899 Other long term (current) drug therapy: Secondary | ICD-10-CM | POA: Diagnosis not present

## 2017-09-08 DIAGNOSIS — E119 Type 2 diabetes mellitus without complications: Secondary | ICD-10-CM | POA: Diagnosis not present

## 2017-09-08 DIAGNOSIS — Z79899 Other long term (current) drug therapy: Secondary | ICD-10-CM | POA: Diagnosis not present

## 2017-09-08 DIAGNOSIS — I10 Essential (primary) hypertension: Secondary | ICD-10-CM | POA: Diagnosis not present

## 2017-09-08 DIAGNOSIS — I2510 Atherosclerotic heart disease of native coronary artery without angina pectoris: Secondary | ICD-10-CM | POA: Diagnosis not present

## 2017-09-08 DIAGNOSIS — E559 Vitamin D deficiency, unspecified: Secondary | ICD-10-CM | POA: Diagnosis not present

## 2017-09-08 DIAGNOSIS — Z7984 Long term (current) use of oral hypoglycemic drugs: Secondary | ICD-10-CM | POA: Diagnosis not present

## 2017-09-08 DIAGNOSIS — E785 Hyperlipidemia, unspecified: Secondary | ICD-10-CM | POA: Diagnosis not present

## 2017-09-08 DIAGNOSIS — J069 Acute upper respiratory infection, unspecified: Secondary | ICD-10-CM | POA: Diagnosis not present

## 2017-09-11 DIAGNOSIS — I7090 Unspecified atherosclerosis: Secondary | ICD-10-CM | POA: Diagnosis not present

## 2017-09-11 DIAGNOSIS — D259 Leiomyoma of uterus, unspecified: Secondary | ICD-10-CM | POA: Diagnosis not present

## 2017-09-11 DIAGNOSIS — N644 Mastodynia: Secondary | ICD-10-CM | POA: Diagnosis not present

## 2017-09-11 DIAGNOSIS — Z01411 Encounter for gynecological examination (general) (routine) with abnormal findings: Secondary | ICD-10-CM | POA: Diagnosis not present

## 2017-09-11 DIAGNOSIS — E1169 Type 2 diabetes mellitus with other specified complication: Secondary | ICD-10-CM | POA: Diagnosis not present

## 2017-09-11 DIAGNOSIS — I10 Essential (primary) hypertension: Secondary | ICD-10-CM | POA: Diagnosis not present

## 2017-09-25 ENCOUNTER — Ambulatory Visit: Payer: 59

## 2017-09-29 ENCOUNTER — Other Ambulatory Visit: Payer: Self-pay | Admitting: *Deleted

## 2017-09-29 NOTE — Patient Outreach (Addendum)
Danine transitioned from the [employer] [facility] program to the [employer] on 11/15/16 for Type II diabetes self-management assistance so will close case to the diabetes [facility] program due to delegation of disease management services to [employer] from [employer] for [place] members in 2019. Barrington Ellison RN,CCM,CDE [place] Management Coordinator [facility] and [employer] [phone] Office Fax [phone]

## 2017-10-12 ENCOUNTER — Other Ambulatory Visit: Payer: Self-pay | Admitting: Interventional Cardiology

## 2017-10-12 MED FILL — METFORMIN HCL ER 500 MG TAB: 500 | 90 days supply | Qty: 180 | Fill #2

## 2017-10-13 MED FILL — ROSUVASTATIN CALCIUM 40 MG: 40 | 90 days supply | Qty: 90 | Fill #0

## 2017-10-13 MED FILL — METOPROLOL TARTRATE 25 MG T: 25 | 90 days supply | Qty: 180 | Fill #0

## 2017-10-13 MED FILL — VITAMIN D3 2000 UNIT CAPS: 50 MCG | 90 days supply | Qty: 90 | Fill #0

## 2017-10-13 MED FILL — EZETIMIBE 10 MG TABS: 10 | 90 days supply | Qty: 90 | Fill #0

## 2017-11-13 ENCOUNTER — Ambulatory Visit: Payer: Self-pay | Admitting: Physician Assistant

## 2017-11-16 ENCOUNTER — Telehealth: Payer: Self-pay

## 2017-11-16 NOTE — Telephone Encounter (Signed)
Received pt's FMLA forms today. Called pt and advised her that he will be out of town until late January. Pt has been informed of the delay for filling out the FMLA forms. Will place on clipboard in nurses station under "pending."

## 2017-11-23 MED FILL — ACCU-CHEK GUIDE TEST STRIP: 90 days supply | Qty: 200 | Fill #2

## 2017-11-29 ENCOUNTER — Other Ambulatory Visit: Payer: Self-pay | Admitting: Allergy and Immunology

## 2017-11-29 MED FILL — OSELTAMIVIR PHOSPHATE 75 MG: 75 | 5 days supply | Qty: 10 | Fill #0

## 2017-11-30 MED FILL — raNITIdine HCL 150 MG TABS: 150 | 15 days supply | Qty: 30 | Fill #0

## 2017-12-13 ENCOUNTER — Other Ambulatory Visit: Payer: Self-pay | Admitting: Obstetrics and Gynecology

## 2017-12-13 DIAGNOSIS — N644 Mastodynia: Secondary | ICD-10-CM | POA: Diagnosis not present

## 2017-12-14 DIAGNOSIS — D649 Anemia, unspecified: Secondary | ICD-10-CM | POA: Diagnosis not present

## 2017-12-14 DIAGNOSIS — M1990 Unspecified osteoarthritis, unspecified site: Secondary | ICD-10-CM | POA: Diagnosis not present

## 2017-12-14 DIAGNOSIS — M3214 Glomerular disease in systemic lupus erythematosus: Secondary | ICD-10-CM | POA: Diagnosis not present

## 2017-12-14 DIAGNOSIS — N181 Chronic kidney disease, stage 1: Secondary | ICD-10-CM | POA: Diagnosis not present

## 2017-12-14 DIAGNOSIS — E119 Type 2 diabetes mellitus without complications: Secondary | ICD-10-CM | POA: Diagnosis not present

## 2017-12-14 DIAGNOSIS — I129 Hypertensive chronic kidney disease with stage 1 through stage 4 chronic kidney disease, or unspecified chronic kidney disease: Secondary | ICD-10-CM | POA: Diagnosis not present

## 2017-12-14 DIAGNOSIS — E785 Hyperlipidemia, unspecified: Secondary | ICD-10-CM | POA: Diagnosis not present

## 2017-12-14 DIAGNOSIS — I2510 Atherosclerotic heart disease of native coronary artery without angina pectoris: Secondary | ICD-10-CM | POA: Diagnosis not present

## 2017-12-14 DIAGNOSIS — Z889 Allergy status to unspecified drugs, medicaments and biological substances status: Secondary | ICD-10-CM | POA: Diagnosis not present

## 2017-12-18 ENCOUNTER — Other Ambulatory Visit: Payer: 59

## 2017-12-20 ENCOUNTER — Ambulatory Visit
Admission: RE | Admit: 2017-12-20 | Discharge: 2017-12-20 | Disposition: A | Discharge status: Home / Self Care | Payer: 59 | Source: Ambulatory Visit | Attending: Obstetrics and Gynecology | Admitting: Obstetrics and Gynecology

## 2017-12-20 ENCOUNTER — Ambulatory Visit: Payer: 59

## 2017-12-20 DIAGNOSIS — R928 Other abnormal and inconclusive findings on diagnostic imaging of breast: Secondary | ICD-10-CM | POA: Diagnosis not present

## 2017-12-20 DIAGNOSIS — N644 Mastodynia: Secondary | ICD-10-CM

## 2017-12-22 DIAGNOSIS — E119 Type 2 diabetes mellitus without complications: Secondary | ICD-10-CM | POA: Diagnosis not present

## 2017-12-22 DIAGNOSIS — H34211 Partial retinal artery occlusion, right eye: Secondary | ICD-10-CM | POA: Diagnosis not present

## 2017-12-26 ENCOUNTER — Ambulatory Visit: Payer: 59 | Admitting: Allergy and Immunology

## 2017-12-26 ENCOUNTER — Encounter: Payer: Self-pay | Admitting: Allergy and Immunology

## 2017-12-26 VITALS — BP 118/70 | HR 68 | Resp 16

## 2017-12-26 DIAGNOSIS — J3089 Other allergic rhinitis: Secondary | ICD-10-CM | POA: Diagnosis not present

## 2017-12-26 DIAGNOSIS — J4540 Moderate persistent asthma, uncomplicated: Secondary | ICD-10-CM

## 2017-12-26 DIAGNOSIS — K219 Gastro-esophageal reflux disease without esophagitis: Secondary | ICD-10-CM | POA: Diagnosis not present

## 2017-12-26 MED ORDER — BUDESONIDE-FORMOTEROL FUMARATE 160-4.5 MCG/ACT IN AERO: INHALATION_SPRAY | RESPIRATORY_TRACT | 5 refills | Status: DC

## 2017-12-26 MED ORDER — VENTOLIN HFA 108 (90 BASE) MCG/ACT IN AERS: INHALATION_SPRAY | RESPIRATORY_TRACT | 1 refills | Status: DC

## 2017-12-26 NOTE — Progress Notes (Signed)
Follow-up Note  Referring Provider: Josetta Huddle, MD Primary Provider: Josetta Huddle, MD Date of Office Visit: 12/26/2017  Subjective:   Deanna Simmons (DOB: 1951/08/15) is a 67 y.o. female who returns to the [facility] on 12/26/2017 in re-evaluation of the following:  HPI: Deanna Simmons returns to this clinic in evaluation of asthma and allergic rhinitis and LPR.  Her last visit to this clinic was 23 May 2017.  Overall she has had an excellent response regarding her current therapy directed against respiratory tract inflammation of both her upper and lower airways.  She has not required a systemic steroid or an antibiotic to treat any type of respiratory tract issue and she rarely uses a short acting bronchodilator.  Her reflux is doing very well while utilizing ranitidine 150 mg twice a day.  If she misses ranitidine she definitely develops classic reflux symptoms with regurgitation.  She is now off all forms of caffeine.  She did receive the flu vaccine this year.  Allergies as of 12/26/2017      Reactions   Biaxin [clarithromycin] Itching   Cardiolite [technetium-39m] Itching   Ciprofloxacin Itching   Erythromycin Base Itching   Montelukast Sodium Itching   Niaspan [niacin Er] Other (See Comments)   flushing   Penicillins Itching   Prilosec [omeprazole] Itching   Singulair [montelukast]    Ticlid [ticlopidine Hcl] Itching      Medication List      aspirin 81 MG tablet Take 81 mg by mouth daily.   beclomethasone 80 MCG/ACT inhaler Commonly known as:  QVAR REDIHALER Take three inhalations three times daily during flare-up as directed.  Rinse, gargle, and spit after use.   benzonatate 100 MG capsule Commonly known as:  TESSALON Take 1-2 capsules (100-200 mg total) by mouth 3 (three) times daily as needed for cough.   cetirizine 10 MG tablet Commonly known as:  ZYRTEC Take 10 mg by mouth daily.   fluticasone 50 MCG/ACT nasal spray Commonly known as:   FLONASE Place 2 sprays into the nose daily as needed. Nasal congestion   Magnesium Oxide 500 MG Tabs   metFORMIN 500 MG tablet Commonly known as:  GLUCOPHAGE Take 500 mg by mouth daily.   metoprolol tartrate 25 MG tablet Commonly known as:  LOPRESSOR Take 1 tablet (25 mg total) by mouth 2 (two) times daily.   [place] DM PO Take by mouth as needed. Reported on 03/29/2016   MULTIVITAL PO Take 1 tablet by mouth daily.   nitroGLYCERIN 0.4 MG SL tablet Commonly known as:  NITROSTAT Place 0.4 mg under the tongue every 5 (five) minutes as needed for chest pain (3 doses MAX). Reported on 12/01/2015   ranitidine 150 MG tablet Commonly known as:  ZANTAC TAKE 1 TABLET (150 MG TOTAL) BY MOUTH 2 (TWO) TIMES DAILY.   rosuvastatin 40 MG tablet Commonly known as:  CRESTOR Take 1 tablet (40 mg total) by mouth daily.   sodium chloride 0.65 % Soln nasal spray Commonly known as:  OCEAN Place 1 spray into both nostrils as needed for congestion.   SYMBICORT 160-4.5 MCG/ACT inhaler Generic drug:  budesonide-formoterol INHALE 2 PUFFS INTO THE LUNGS 2 TIMES DAILY.   TRUE METRIX BLOOD GLUCOSE TEST test strip Generic drug:  glucose blood   TRUEPLUS LANCETS 30G Misc   ULTRAFLORA IMMUNE HEALTH PO Take 1 tablet by mouth at bedtime.   valACYclovir 500 MG tablet Commonly known as:  VALTREX Take 500 mg by mouth daily as  needed (cold sores/blisters). Reported on 12/01/2015   VENTOLIN HFA 108 (90 Base) MCG/ACT inhaler Generic drug:  albuterol INHALE 2 PUFFS BY MOUTH EVERY 4-6 HOURS AS NEEDED FOR COUGH OR WHEEZE   Vitamin D3 2000 units Tabs Take 1 tablet by mouth daily.       Past Medical History:  Diagnosis Date  . Abnormal finding on Pap smear, ASCUS   . Anemia   . Asthma   . Chondromalacia of knee   . Coronary artery disease   . Diabetes mellitus without complication ([place])   . Diverticulum 12/11/2002   in cecum  . Fibroids    h/o  . Glucose intolerance (impaired glucose  tolerance)   . H/O elevated lipids   . H/O tinea cruris 02/2010  . History of ASCVD    s/p CABG  . Hypercholesteremia   . Hypertension   . Internal hemorrhoids    h/o  . Irregular menses    h/o  . MI (myocardial infarction) ([place]) age 41  . Obesity   . Pneumonia 07/2008  . Synovitis of knee   . Systemic lupus erythematosus ([place])    with nephritis  . Vulvitis    h/o    Past Surgical History:  Procedure Laterality Date  . CESAREAN SECTION    . CORONARY ARTERY BYPASS GRAFT    . Endometrial curettage  03/29/2000  . ROTATOR CUFF REPAIR    . TUBAL LIGATION    . UPPER GASTROINTESTINAL ENDOSCOPY  12/11/2002    Review of systems negative except as noted in HPI / PMHx or noted below:  Review of Systems  Constitutional: Negative.   HENT: Negative.   Eyes: Negative.   Respiratory: Negative.   Cardiovascular: Negative.   Gastrointestinal: Negative.   Genitourinary: Negative.   Musculoskeletal: Negative.   Skin: Negative.   Neurological: Negative.   Endo/Heme/Allergies: Negative.   Psychiatric/Behavioral: Negative.      Objective:   Vitals:   12/26/17 1737  BP: 118/70  Pulse: 68  Resp: 16          Physical Exam  Constitutional: She is well-developed, well-nourished, and in no distress.  HENT:  Head: Normocephalic.  Right Ear: Tympanic membrane, external ear and ear canal normal.  Left Ear: Tympanic membrane, external ear and ear canal normal.  Nose: Nose normal. No mucosal edema or rhinorrhea.  Mouth/Throat: Uvula is midline, oropharynx is clear and moist and mucous membranes are normal. No oropharyngeal exudate.  Eyes: Conjunctivae are normal.  Neck: Trachea normal. No tracheal tenderness present. No tracheal deviation present. No thyromegaly present.  Cardiovascular: Normal rate, regular rhythm, S1 normal, S2 normal and normal heart sounds.  No murmur (Systolic) heard. Pulmonary/Chest: Breath sounds normal. No stridor. No respiratory distress. She has no  wheezes. She has no rales.  Musculoskeletal: She exhibits no edema.  Lymphadenopathy:       Head (right side): No tonsillar adenopathy present.       Head (left side): No tonsillar adenopathy present.    She has no cervical adenopathy.  Neurological: She is alert. Gait normal.  Skin: No rash noted. She is not diaphoretic. No erythema. Nails show no clubbing.  Psychiatric: Mood and affect normal.    Diagnostics:    Spirometry was performed and demonstrated an FEV1 of 2.12 at 106 % of predicted.  The patient had an Asthma Control Test with the following results: ACT Total Score: 24.    Assessment and Plan:   1. Asthma, moderate persistent, well-controlled   2.  Other allergic rhinitis   3. LPRD (laryngopharyngeal reflux disease)     1. Symbicort 160 two inhalations 2 inhalations 2 times per day    2. "Action plan" for asthma flare:   A. Qvar 80 REDIHALER 3 inhalations 3 times per day  B. use Proventil HFA 2 puffs every 4-6 hours if needed  3. Flonase 1 spray each nostril one -two times a day during periods of upper airway symptoms  4. Zyrtec 10 mg one tablet one time per day if needed  5. Continue Ranitidine 150 two times per day.    6. Return to clinic in 6 months or earlier if problem  Charene appears to be doing quite well on her current plan and she will continue to use anti-inflammatory agents for her respiratory tract and therapy directed against reflux as noted above.  Assuming she continues to do well I will see her back in this clinic in 6 months or earlier if there is a problem.  Allena Katz, MD Allergy / Immunology [place]

## 2017-12-26 NOTE — Patient Instructions (Addendum)
  1. Symbicort 160 two inhalations 2 inhalations 2 times per day    2. "Action plan" for asthma flare:   A. Qvar 80 REDIHALER 3 inhalations 3 times per day  B. use Proventil HFA 2 puffs every 4-6 hours if needed  3. Flonase 1 spray each nostril one -two times a day during periods of upper airway symptoms  4. Zyrtec 10 mg one tablet one time per day if needed  5. Continue Ranitidine 150 two times per day.    6. Return to clinic in 6 months or earlier if problem

## 2017-12-27 ENCOUNTER — Encounter: Payer: Self-pay | Admitting: Allergy and Immunology

## 2017-12-27 MED FILL — VENTOLIN HFA 90 MCG INHALER: 108 (90 BAS | 17 days supply | Qty: 18 | Fill #0

## 2017-12-27 MED FILL — SYMBICORT 160-4.5 MCG INH: 160-4.5 | 30 days supply | Qty: 10 | Fill #0

## 2017-12-28 ENCOUNTER — Other Ambulatory Visit: Payer: Self-pay | Admitting: Allergy and Immunology

## 2018-01-01 DIAGNOSIS — J309 Allergic rhinitis, unspecified: Secondary | ICD-10-CM | POA: Diagnosis not present

## 2018-01-01 DIAGNOSIS — J45909 Unspecified asthma, uncomplicated: Secondary | ICD-10-CM | POA: Diagnosis not present

## 2018-01-03 MED FILL — HYDROCODONE-HOMATROPINE SOL: 5-1.5 | 5 days supply | Qty: 100 | Fill #0

## 2018-01-03 MED FILL — raNITIdine HCL 150 MG TABS: 150 | 30 days supply | Qty: 60 | Fill #0

## 2018-01-25 MED FILL — ROSUVASTATIN CALCIUM 40 MG: 40 | 90 days supply | Qty: 90 | Fill #1

## 2018-01-25 MED FILL — METOPROLOL TARTRATE 25 MG T: 25 | 90 days supply | Qty: 180 | Fill #1

## 2018-01-25 MED FILL — EZETIMIBE 10 MG TABS: 10 | 90 days supply | Qty: 90 | Fill #1

## 2018-01-27 MED FILL — raNITIdine HCL 150 MG TABS: 150 | 90 days supply | Qty: 180 | Fill #1

## 2018-02-28 MED FILL — ACCU-CHEK GUIDE TEST STRIP: 90 days supply | Qty: 200 | Fill #3

## 2018-02-28 MED FILL — VITAMIN D3 2000 UNIT CAPS: 50 MCG | 90 days supply | Qty: 90 | Fill #1

## 2018-04-12 DIAGNOSIS — J45909 Unspecified asthma, uncomplicated: Secondary | ICD-10-CM | POA: Diagnosis not present

## 2018-04-12 DIAGNOSIS — I2510 Atherosclerotic heart disease of native coronary artery without angina pectoris: Secondary | ICD-10-CM | POA: Diagnosis not present

## 2018-04-12 DIAGNOSIS — M179 Osteoarthritis of knee, unspecified: Secondary | ICD-10-CM | POA: Diagnosis not present

## 2018-04-12 DIAGNOSIS — Z79899 Other long term (current) drug therapy: Secondary | ICD-10-CM | POA: Diagnosis not present

## 2018-04-12 DIAGNOSIS — I10 Essential (primary) hypertension: Secondary | ICD-10-CM | POA: Diagnosis not present

## 2018-04-12 DIAGNOSIS — E119 Type 2 diabetes mellitus without complications: Secondary | ICD-10-CM | POA: Diagnosis not present

## 2018-04-12 DIAGNOSIS — E785 Hyperlipidemia, unspecified: Secondary | ICD-10-CM | POA: Diagnosis not present

## 2018-04-12 DIAGNOSIS — J309 Allergic rhinitis, unspecified: Secondary | ICD-10-CM | POA: Diagnosis not present

## 2018-04-12 DIAGNOSIS — E559 Vitamin D deficiency, unspecified: Secondary | ICD-10-CM | POA: Diagnosis not present

## 2018-04-24 MED FILL — EZETIMIBE 10 MG TABLET: 10 | 90 days supply | Qty: 90 | Fill #2

## 2018-04-24 MED FILL — raNITIdine HCL 150 MG TABS: 150 | 60 days supply | Qty: 120 | Fill #2

## 2018-04-24 MED FILL — ROSUVASTATIN CALCIUM 40 MG: 40 | 90 days supply | Qty: 90 | Fill #2

## 2018-04-24 MED FILL — METOPROLOL TARTRATE 25 MG T: 25 | 90 days supply | Qty: 180 | Fill #2

## 2018-05-07 MED FILL — METFORMIN HCL ER 500 MG TAB: 500 | 90 days supply | Qty: 180 | Fill #0

## 2018-06-05 ENCOUNTER — Ambulatory Visit: Payer: 59 | Admitting: Allergy and Immunology

## 2018-06-05 DIAGNOSIS — J309 Allergic rhinitis, unspecified: Secondary | ICD-10-CM

## 2018-06-15 DIAGNOSIS — R040 Epistaxis: Secondary | ICD-10-CM | POA: Diagnosis not present

## 2018-06-20 DIAGNOSIS — E119 Type 2 diabetes mellitus without complications: Secondary | ICD-10-CM | POA: Diagnosis not present

## 2018-06-20 DIAGNOSIS — H34211 Partial retinal artery occlusion, right eye: Secondary | ICD-10-CM | POA: Diagnosis not present

## 2018-06-26 ENCOUNTER — Ambulatory Visit: Payer: 59 | Admitting: Allergy and Immunology

## 2018-06-26 ENCOUNTER — Encounter: Payer: Self-pay | Admitting: Podiatry

## 2018-06-26 ENCOUNTER — Ambulatory Visit (INDEPENDENT_AMBULATORY_CARE_PROVIDER_SITE_OTHER): Payer: 59

## 2018-06-26 ENCOUNTER — Ambulatory Visit: Payer: 59 | Admitting: Podiatry

## 2018-06-26 DIAGNOSIS — M2041 Other hammer toe(s) (acquired), right foot: Secondary | ICD-10-CM | POA: Diagnosis not present

## 2018-06-26 DIAGNOSIS — M21619 Bunion of unspecified foot: Secondary | ICD-10-CM

## 2018-06-26 DIAGNOSIS — L84 Corns and callosities: Secondary | ICD-10-CM

## 2018-06-26 NOTE — Progress Notes (Signed)
Subjective:    Patient ID: Deanna Simmons, female    DOB: 01-22-1951, 67 y.o.   MRN: [id number]  HPI 67 year old female presents the office today for concerns of a painful corn on the top of the right second toe which is been ongoing for about 2 weeks.  She states that she developed a soft tissue or different shoe.  She is been shown some parts of the oral.  Denies any drainage or pus or any swelling.  No injury.  She is diabetic her last blood sugar she checked was 130.  She thinks her last A1c was around 7.  No claudication symptoms.  No numbness or tingling.  No other concerns.   Review of Systems  All other systems reviewed and are negative.  Past Medical History:  Diagnosis Date  . Abnormal finding on Pap smear, ASCUS   . Anemia   . Asthma   . Chondromalacia of knee   . Coronary artery disease   . Diabetes mellitus without complication ([place])   . Diverticulum 12/11/2002   in cecum  . Fibroids    h/o  . Glucose intolerance (impaired glucose tolerance)   . H/O elevated lipids   . H/O tinea cruris 02/2010  . History of ASCVD    s/p CABG  . Hypercholesteremia   . Hypertension   . Internal hemorrhoids    h/o  . Irregular menses    h/o  . MI (myocardial infarction) ([place]) age 55  . Obesity   . Pneumonia 07/2008  . Synovitis of knee   . Systemic lupus erythematosus ([place])    with nephritis  . Vulvitis    h/o    Past Surgical History:  Procedure Laterality Date  . CESAREAN SECTION    . CORONARY ARTERY BYPASS GRAFT    . Endometrial curettage  03/29/2000  . ROTATOR CUFF REPAIR    . TUBAL LIGATION    . UPPER GASTROINTESTINAL ENDOSCOPY  12/11/2002     Current Outpatient Medications:  .  aspirin 81 MG tablet, Take 81 mg by mouth daily., Disp: , Rfl:  .  beclomethasone (QVAR REDIHALER) 80 MCG/ACT inhaler, Take three inhalations three times daily during flare-up as directed.  Rinse, gargle, and spit after use., Disp: 1 Inhaler, Rfl: 3 .  benzonatate (TESSALON) 100 MG  capsule, Take 1-2 capsules (100-200 mg total) by mouth 3 (three) times daily as needed for cough., Disp: 40 capsule, Rfl: 0 .  budesonide-formoterol (SYMBICORT) 160-4.5 MCG/ACT inhaler, INHALE 2 PUFFS INTO THE LUNGS 2 TIMES DAILY., Disp: 10.2 g, Rfl: 5 .  cetirizine (ZYRTEC) 10 MG tablet, Take 10 mg by mouth daily., Disp: , Rfl:  .  Cholecalciferol (VITAMIN D3) 2000 UNITS TABS, Take 1 tablet by mouth daily., Disp: , Rfl:  .  Dextromethorphan-Guaifenesin (MUCINEX DM PO), Take by mouth as needed. Reported on 03/29/2016, Disp: , Rfl:  .  fluticasone (FLONASE) 50 MCG/ACT nasal spray, Place 2 sprays into the nose daily as needed. Nasal congestion, Disp: , Rfl:  .  Magnesium Oxide 500 MG TABS, , Disp: , Rfl: 3 .  metFORMIN (GLUCOPHAGE) 500 MG tablet, Take 500 mg by mouth daily., Disp: , Rfl:  .  metoprolol tartrate (LOPRESSOR) 25 MG tablet, Take 1 tablet (25 mg total) by mouth 2 (two) times daily., Disp: 180 tablet, Rfl: 3 .  Multiple Vitamins-Minerals (MULTIVITAL PO), Take 1 tablet by mouth daily., Disp: , Rfl:  .  nitroGLYCERIN (NITROSTAT) 0.4 MG SL tablet, Place 0.4 mg under the tongue  every 5 (five) minutes as needed for chest pain (3 doses MAX). Reported on 12/01/2015, Disp: , Rfl:  .  Probiotic Product ([place]), Take 1 tablet by mouth at bedtime., Disp: , Rfl:  .  ranitidine (ZANTAC) 150 MG tablet, Take 1 tablet (150 mg total) by mouth 2 (two) times daily., Disp: 60 tablet, Rfl: 5 .  rosuvastatin (CRESTOR) 40 MG tablet, Take 1 tablet (40 mg total) by mouth daily., Disp: 90 tablet, Rfl: 3 .  sodium chloride (OCEAN) 0.65 % SOLN nasal spray, Place 1 spray into both nostrils as needed for congestion., Disp: , Rfl:  .  TRUE METRIX BLOOD GLUCOSE TEST test strip, , Disp: , Rfl: 3 .  TRUEPLUS LANCETS 30G MISC, , Disp: , Rfl: 3 .  valACYclovir (VALTREX) 500 MG tablet, Take 500 mg by mouth daily as needed (cold sores/blisters). Reported on 12/01/2015, Disp: , Rfl:  .  VENTOLIN HFA 108 (90  Base) MCG/ACT inhaler, INHALE 2 PUFFS BY MOUTH EVERY 4-6 HOURS AS NEEDED FOR COUGH OR WHEEZE, Disp: 18 g, Rfl: 1  Allergies  Allergen Reactions  . Biaxin [Clarithromycin] Itching  . Cardiolite [Technetium-84m] Itching  . Ciprofloxacin Itching  . Erythromycin Base Itching  . Montelukast Sodium Itching  . Niaspan [Niacin Er] Other (See Comments)    flushing  . Penicillins Itching  . Prilosec [Omeprazole] Itching  . Singulair [Montelukast]   . Ticlid [Ticlopidine Hcl] Itching        Objective:   Physical Exam General: AAO x3, NAD  Dermatological: Multiple lesions present on the right dorsal second PIPJ.  Upon the clinic there is no underlying ulceration, drainage or any clinical signs of infection present.  No other open lesions or pre-ulcerative lesions.  Vascular: Dorsalis Pedis artery and Posterior Tibial artery pedal pulses are 2/4 bilateral with immedate capillary fill time.  There is no pain with calf compression, swelling, warmth, erythema.   Neruologic: Grossly intact via light touch bilateral. . Protective threshold with Semmes Wienstein monofilament intact to all pedal sites bilateral.   Musculoskeletal: Bunion deformity is present bilaterally as well as hammertoe contracture.  The second toe started over the left hallux.  No tenderness on these areas.  Only area of tenderness.  Hyperkeratotic lesion prior to debridement muscular strength 5/5 in all groups tested bilateral.  Gait: Unassisted, Nonantalgic.      Assessment & Plan:  67 year old female with hyperkeratotic lesion right second toe due to underlying digital deformity -Treatment options discussed including all alternatives, risks, and complications -Etiology of symptoms were discussed -Hyperkeratotic lesion sharply debrided x1 without any complications or bleeding.  I dispensed various offloading pads for her.  Discussed the change in shoes to help take pressure off the areas as well.  In the future consider  surgical intervention if needed but hopefully can avoid this.  Trula Slade DPM

## 2018-06-26 NOTE — Patient Instructions (Signed)
Diabetes and Foot Care Diabetes may cause you to have problems because of poor blood supply (circulation) to your feet and legs. This may cause the skin on your feet to become thinner, break easier, and heal more slowly. Your skin may become dry, and the skin may peel and crack. You may also have nerve damage in your legs and feet causing decreased feeling in them. You may not notice minor injuries to your feet that could lead to infections or more serious problems. Taking care of your feet is one of the most important things you can do for yourself. Follow these instructions at home:  Wear shoes at all times, even in the house. Do not go barefoot. Bare feet are easily injured.  Check your feet daily for blisters, cuts, and redness. If you cannot see the bottom of your feet, use a mirror or ask someone for help.  Wash your feet with warm water (do not use hot water) and mild soap. Then pat your feet and the areas between your toes until they are completely dry. Do not soak your feet as this can dry your skin.  Apply a moisturizing lotion or petroleum jelly (that does not contain alcohol and is unscented) to the skin on your feet and to dry, brittle toenails. Do not apply lotion between your toes.  Trim your toenails straight across. Do not dig under them or around the cuticle. File the edges of your nails with an emery board or nail file.  Do not cut corns or calluses or try to remove them with medicine.  Wear clean socks or stockings every day. Make sure they are not too tight. Do not wear knee-high stockings since they may decrease blood flow to your legs.  Wear shoes that fit properly and have enough cushioning. To break in new shoes, wear them for just a few hours a day. This prevents you from injuring your feet. Always look in your shoes before you put them on to be sure there are no objects inside.  Do not cross your legs. This may decrease the blood flow to your feet.  If you find a  minor scrape, cut, or break in the skin on your feet, keep it and the skin around it clean and dry. These areas may be cleansed with mild soap and water. Do not cleanse the area with peroxide, alcohol, or iodine.  When you remove an adhesive bandage, be sure not to damage the skin around it.  If you have a wound, look at it several times a day to make sure it is healing.  Do not use heating pads or hot water bottles. They may burn your skin. If you have lost feeling in your feet or legs, you may not know it is happening until it is too late.  Make sure your health care provider performs a complete foot exam at least annually or more often if you have foot problems. Report any cuts, sores, or bruises to your health care provider immediately. Contact a health care provider if:  You have an injury that is not healing.  You have cuts or breaks in the skin.  You have an ingrown nail.  You notice redness on your legs or feet.  You feel burning or tingling in your legs or feet.  You have pain or cramps in your legs and feet.  Your legs or feet are numb.  Your feet always feel cold. Get help right away if:  There is increasing   redness, swelling, or pain in or around a wound.  There is a red line that goes up your leg.  Pus is coming from a wound.  You develop a fever or as directed by your health care provider.  You notice a bad smell coming from an ulcer or wound. This information is not intended to replace advice given to you by your health care provider. Make sure you discuss any questions you have with your health care provider. Document Released: 10/14/2000 Document Revised: 03/24/2016 Document Reviewed: 03/26/2013 Elsevier Interactive Patient Education  2017 [employer]  Corns and Calluses Corns are small areas of thickened skin that occur on the top, sides, or tip of a toe. They contain a cone-shaped core with a point that can press on a nerve below. This causes pain.  Calluses are areas of thickened skin that can occur anywhere on the body including hands, fingers, palms, soles of the feet, and heels.Calluses are usually larger than corns. What are the causes? Corns and calluses are caused by rubbing (friction) or pressure, such as from shoes that are too tight or do not fit properly. What increases the risk? Corns are more likely to develop in people who have toe deformities, such as hammer toes. Since calluses can occur with friction to any area of the skin, calluses are more likely to develop in people who: Work with their hands. Wear shoes that fit poorly, shoes that are too tight, or shoes that are high-heeled. Have toes deformities.  What are the signs or symptoms? Symptoms of a corn or callus include: A hard growth on the skin. Pain or tenderness under the skin. Redness and swelling. Increased discomfort while wearing tight-fitting shoes.  How is this diagnosed? Corns and calluses may be diagnosed with a medical history and physical exam. How is this treated? Corns and calluses may be treated with: Removing the cause of the friction or pressure. This may include: Changing your shoes. Wearing shoe inserts (orthotics) or other protective layers in your shoes, such as a corn pad. Wearing gloves. Medicines to help soften skin in the hardened, thickened areas. Reducing the size of the corn or callus by removing the dead layers of skin. Antibiotic medicines to treat infection. Surgery, if a toe deformity is the cause.  Follow these instructions at home: Take medicines only as directed by your health care provider. If you were prescribed an antibiotic, finish all of it even if you start to feel better. Wear shoes that fit well. Avoid wearing high-heeled shoes and shoes that are too tight or too loose. Wear any padding, protective layers, gloves, or orthotics as directed by your health care provider. Soak your hands or feet and then use a file  or pumice stone to soften your corn or callus. Do this as directed by your health care provider. Check your corn or callus every day for signs of infection. Watch for: Redness, swelling, or pain. Fluid, blood, or pus. Contact a health care provider if: Your symptoms do not improve with treatment. You have increased redness, swelling, or pain at the site of your corn or callus. You have fluid, blood, or pus coming from your corn or callus. You have new symptoms. This information is not intended to replace advice given to you by your health care provider. Make sure you discuss any questions you have with your health care provider. Document Released: 07/23/2004 Document Revised: 05/06/2016 Document Reviewed: 10/13/2014 Elsevier Interactive Patient Education  2018 [employer]  

## 2018-06-28 DIAGNOSIS — L84 Corns and callosities: Secondary | ICD-10-CM | POA: Insufficient documentation

## 2018-06-28 DIAGNOSIS — M2041 Other hammer toe(s) (acquired), right foot: Secondary | ICD-10-CM | POA: Insufficient documentation

## 2018-06-28 DIAGNOSIS — M21619 Bunion of unspecified foot: Secondary | ICD-10-CM | POA: Insufficient documentation

## 2018-07-16 ENCOUNTER — Other Ambulatory Visit: Payer: Self-pay | Admitting: Allergy and Immunology

## 2018-07-16 MED FILL — ROSUVASTATIN CALCIUM 40 MG: 40 | 90 days supply | Qty: 90 | Fill #3

## 2018-07-16 MED FILL — METOPROLOL TARTRATE 25 MG T: 25 | 90 days supply | Qty: 180 | Fill #3

## 2018-07-16 MED FILL — EZETIMIBE 10 MG TABLET: 10 | 90 days supply | Qty: 90 | Fill #3

## 2018-07-17 ENCOUNTER — Other Ambulatory Visit: Payer: Self-pay

## 2018-07-17 MED FILL — raNITIdine HCL 150 MG TABS: 150 | 30 days supply | Qty: 60 | Fill #0

## 2018-07-24 ENCOUNTER — Ambulatory Visit: Payer: 59 | Admitting: Allergy and Immunology

## 2018-07-24 ENCOUNTER — Encounter: Payer: Self-pay | Admitting: *Deleted

## 2018-07-24 ENCOUNTER — Encounter: Payer: Self-pay | Admitting: Allergy and Immunology

## 2018-07-24 VITALS — BP 118/78 | HR 72 | Resp 16

## 2018-07-24 DIAGNOSIS — K219 Gastro-esophageal reflux disease without esophagitis: Secondary | ICD-10-CM

## 2018-07-24 DIAGNOSIS — J4540 Moderate persistent asthma, uncomplicated: Secondary | ICD-10-CM

## 2018-07-24 DIAGNOSIS — J3089 Other allergic rhinitis: Secondary | ICD-10-CM

## 2018-07-24 MED ORDER — RANITIDINE HCL 150 MG PO TABS: 150.0000 mg | ORAL_TABLET | Freq: Two times a day (BID) | ORAL | 5 refills | Status: DC

## 2018-07-24 MED ORDER — BUDESONIDE-FORMOTEROL FUMARATE 160-4.5 MCG/ACT IN AERO
INHALATION_SPRAY | RESPIRATORY_TRACT | 5 refills | Status: DC
Start: 2018-07-24 — End: 2019-09-23

## 2018-07-24 MED ORDER — VENTOLIN HFA 108 (90 BASE) MCG/ACT IN AERS: INHALATION_SPRAY | RESPIRATORY_TRACT | 1 refills | Status: DC

## 2018-07-24 MED ORDER — BECLOMETHASONE DIPROP HFA 80 MCG/ACT IN AERB: INHALATION_SPRAY | RESPIRATORY_TRACT | 3 refills | Status: DC

## 2018-07-24 MED FILL — VENTOLIN HFA 90 MCG INHALER: 108 (90 BAS | 12 days supply | Qty: 18 | Fill #0

## 2018-07-24 MED FILL — SYMBICORT 160-4.5 MCG INH: 160-4.5 | 30 days supply | Qty: 10 | Fill #0

## 2018-07-24 NOTE — Progress Notes (Signed)
This encounter was created in error - please disregard.

## 2018-07-24 NOTE — Patient Instructions (Addendum)
  1. Symbicort 160 two inhalations 2 inhalations 1-2 times per day depending on disease activity  2. "Action plan" for asthma flare:   A. Qvar 80 REDIHALER 3 inhalations 3 times per day  B. use Proventil HFA 2 puffs every 4-6 hours if needed  3. Flonase 1 spray each nostril one -two times a day during periods of upper airway symptoms  4. Zyrtec 10 mg one tablet one time per day if needed  5. Continue Ranitidine 150 two times per day.    6. Return to clinic in 6 months or earlier if problem  7. Obtain fall flu vaccine

## 2018-07-24 NOTE — Progress Notes (Signed)
Follow-up Note  Referring Provider: Josetta Huddle, MD Primary Provider: Josetta Huddle, MD Date of Office Visit: 07/24/2018  Subjective:   Deanna Simmons (DOB: 1951-03-13) is a 67 y.o. female who returns to the [facility] on 07/24/2018 in re-evaluation of the following:  HPI: Freddy returns to this clinic in reevaluation of asthma and allergic rhinitis and LPR.  Her last visit to this clinic was 26 December 2017.  She has not required a systemic steroid or antibiotic to treat any type of respiratory tract issue during the interval.  She rarely uses a short acting bronchodilator.  She continues on Symbicort mostly 1 time per day.  She has done so well recently that she is no longer using any Flonase.  She does require ranitidine twice a day otherwise she gets burning in her stomach.  Allergies as of 07/24/2018      Reactions   Biaxin [clarithromycin] Itching   Cardiolite [technetium-77m] Itching   Ciprofloxacin Itching   Erythromycin Base Itching   Montelukast Sodium Itching   Niaspan [niacin Er] Other (See Comments)   flushing   Penicillins Itching   Prilosec [omeprazole] Itching   Singulair [montelukast]    Ticlid [ticlopidine Hcl] Itching      Medication List      aspirin 81 MG tablet Take 81 mg by mouth daily.   beclomethasone 80 MCG/ACT inhaler Commonly known as:  QVAR Take three inhalations three times daily during flare-up as directed.  Rinse, gargle, and spit after use.   benzonatate 100 MG capsule Commonly known as:  TESSALON Take 1-2 capsules (100-200 mg total) by mouth 3 (three) times daily as needed for cough.   budesonide-formoterol 160-4.5 MCG/ACT inhaler Commonly known as:  SYMBICORT INHALE 2 PUFFS INTO THE LUNGS 2 TIMES DAILY.   cetirizine 10 MG tablet Commonly known as:  ZYRTEC Take 10 mg by mouth daily.   fluticasone 50 MCG/ACT nasal spray Commonly known as:  FLONASE Place 2 sprays into the nose daily as needed. Nasal  congestion   Magnesium Oxide 500 MG Tabs   metFORMIN 500 MG tablet Commonly known as:  GLUCOPHAGE Take 500 mg by mouth daily.   metoprolol tartrate 25 MG tablet Commonly known as:  LOPRESSOR Take 1 tablet (25 mg total) by mouth 2 (two) times daily.   [place] DM PO Take by mouth as needed. Reported on 03/29/2016   MULTIVITAL PO Take 1 tablet by mouth daily.   nitroGLYCERIN 0.4 MG SL tablet Commonly known as:  NITROSTAT Place 0.4 mg under the tongue every 5 (five) minutes as needed for chest pain (3 doses MAX). Reported on 12/01/2015   ranitidine 150 MG tablet Commonly known as:  ZANTAC Take 1 tablet (150 mg total) by mouth 2 (two) times daily.   rosuvastatin 40 MG tablet Commonly known as:  CRESTOR Take 1 tablet (40 mg total) by mouth daily.   sodium chloride 0.65 % Soln nasal spray Commonly known as:  OCEAN Place 1 spray into both nostrils as needed for congestion.   TRUE METRIX BLOOD GLUCOSE TEST test strip Generic drug:  glucose blood   TRUEPLUS LANCETS 30G Misc   ULTRAFLORA IMMUNE HEALTH PO Take 1 tablet by mouth at bedtime.   valACYclovir 500 MG tablet Commonly known as:  VALTREX Take 500 mg by mouth daily as needed (cold sores/blisters). Reported on 12/01/2015   VENTOLIN HFA 108 (90 Base) MCG/ACT inhaler Generic drug:  albuterol INHALE 2 PUFFS BY MOUTH EVERY 4-6 HOURS AS  NEEDED FOR COUGH OR WHEEZE   Vitamin D3 2000 units Tabs Take 1 tablet by mouth daily.       Past Medical History:  Diagnosis Date  . Abnormal finding on Pap smear, ASCUS   . Anemia   . Asthma   . Chondromalacia of knee   . Coronary artery disease   . Diabetes mellitus without complication ([place])   . Diverticulum 12/11/2002   in cecum  . Fibroids    h/o  . Glucose intolerance (impaired glucose tolerance)   . H/O elevated lipids   . H/O tinea cruris 02/2010  . History of ASCVD    s/p CABG  . Hypercholesteremia   . Hypertension   . Internal hemorrhoids    h/o  . Irregular  menses    h/o  . MI (myocardial infarction) ([place]) age 86  . Obesity   . Pneumonia 07/2008  . Synovitis of knee   . Systemic lupus erythematosus ([place])    with nephritis  . Vulvitis    h/o    Past Surgical History:  Procedure Laterality Date  . CESAREAN SECTION    . CORONARY ARTERY BYPASS GRAFT    . Endometrial curettage  03/29/2000  . ROTATOR CUFF REPAIR    . TUBAL LIGATION    . UPPER GASTROINTESTINAL ENDOSCOPY  12/11/2002    Review of systems negative except as noted in HPI / PMHx or noted below:  Review of Systems  Constitutional: Negative.   HENT: Negative.   Eyes: Negative.   Respiratory: Negative.   Cardiovascular: Negative.   Gastrointestinal: Negative.   Genitourinary: Negative.   Musculoskeletal: Negative.   Skin: Negative.   Neurological: Negative.   Endo/Heme/Allergies: Negative.   Psychiatric/Behavioral: Negative.      Objective:   Vitals:   07/24/18 1746  BP: 118/78  Pulse: 72  Resp: 16          Physical Exam  HENT:  Head: Normocephalic.  Right Ear: Tympanic membrane, external ear and ear canal normal.  Left Ear: Tympanic membrane, external ear and ear canal normal.  Nose: Nose normal. No mucosal edema or rhinorrhea.  Mouth/Throat: Uvula is midline, oropharynx is clear and moist and mucous membranes are normal. No oropharyngeal exudate.  Eyes: Conjunctivae are normal.  Neck: Trachea normal. No tracheal tenderness present. No tracheal deviation present. No thyromegaly present.  Cardiovascular: Normal rate, regular rhythm, S1 normal and S2 normal.  Murmur (Systolic) heard. Pulmonary/Chest: Breath sounds normal. No stridor. No respiratory distress. She has no wheezes. She has no rales.  Musculoskeletal: She exhibits no edema.  Lymphadenopathy:       Head (right side): No tonsillar adenopathy present.       Head (left side): No tonsillar adenopathy present.    She has no cervical adenopathy.  Neurological: She is alert.  Skin: No rash noted.  She is not diaphoretic. No erythema. Nails show no clubbing.    Diagnostics:    Spirometry was performed and demonstrated an FEV1 of 1.90 at 95 % of predicted.  The patient had an Asthma Control Test with the following results: ACT Total Score: 23.    Assessment and Plan:   1. Asthma, moderate persistent, well-controlled   2. Other allergic rhinitis   3. LPRD (laryngopharyngeal reflux disease)     1. Symbicort 160 two inhalations 2 inhalations 1-2 times per day depending on disease activity  2. "Action plan" for asthma flare:   A. Qvar 80 REDIHALER 3 inhalations 3 times per day  B.  use Proventil HFA 2 puffs every 4-6 hours if needed  3. Flonase 1 spray each nostril one -two times a day during periods of upper airway symptoms  4. Zyrtec 10 mg one tablet one time per day if needed  5. Continue Ranitidine 150 two times per day.    6. Return to clinic in 6 months or earlier if problem  7. Obtain fall flu vaccine  Omari appears to be doing very well on her current plan and she will continue to use anti-inflammatory medications for her airway and therapy directed against reflux as noted above and I will see her back in this clinic in 6 months or earlier if there is a problem.  Allena Katz, MD Allergy / Immunology [place]

## 2018-07-25 ENCOUNTER — Encounter: Payer: Self-pay | Admitting: Allergy and Immunology

## 2018-07-26 ENCOUNTER — Other Ambulatory Visit: Payer: Self-pay | Admitting: Obstetrics and Gynecology

## 2018-07-26 DIAGNOSIS — Z1231 Encounter for screening mammogram for malignant neoplasm of breast: Secondary | ICD-10-CM

## 2018-07-30 MED FILL — METFORMIN HCL ER 500 MG TAB: 500 | 90 days supply | Qty: 180 | Fill #1

## 2018-08-06 ENCOUNTER — Telehealth: Payer: Self-pay | Admitting: *Deleted

## 2018-08-06 DIAGNOSIS — J45909 Unspecified asthma, uncomplicated: Secondary | ICD-10-CM | POA: Diagnosis not present

## 2018-08-06 MED FILL — VENTOLIN HFA 90 MCG INHALER: 108 (90 BAS | 17 days supply | Qty: 18 | Fill #1

## 2018-08-06 MED FILL — SYMBICORT 160-4.5 MCG INH: 160-4.5 | 30 days supply | Qty: 10 | Fill #1

## 2018-08-06 NOTE — Telephone Encounter (Signed)
Patient called requesting samples for Symbicort 160 placed 2 up front for her to pick up

## 2018-08-07 ENCOUNTER — Telehealth: Payer: Self-pay | Admitting: Interventional Cardiology

## 2018-08-07 NOTE — Telephone Encounter (Signed)
New Message:    Patient calling she would like to get a sooner appt. She having a little discomfort

## 2018-08-07 NOTE — Telephone Encounter (Signed)
Pt states Sunday night she began to feel some discomfort in her chest like a pulled muscle.  Went to urgent care, vitals were normal and urgent care MD told pt everything looked good.  States pain only occurs when she is moving and truly feels like a pulled muscle.  Takes Tylenol and has relief of pain.  When pain occurring it does not radiate.  Is not constant.  Pt states she knows what angina pain is and this is not the same.  Pt doesn't feel this is cardiac related but states urgent care MD said maybe she could try seeing her Cardiologist.  Advised pt to reach out to her PCP for eval of musculoskeletal issues.  Pt verbalized understanding and was appreciative for call.

## 2018-08-28 ENCOUNTER — Ambulatory Visit: Payer: 59

## 2018-08-28 ENCOUNTER — Ambulatory Visit
Admission: RE | Admit: 2018-08-28 | Discharge: 2018-08-28 | Disposition: A | Discharge status: Home / Self Care | Payer: 59 | Source: Ambulatory Visit | Attending: Obstetrics and Gynecology | Admitting: Obstetrics and Gynecology

## 2018-08-28 DIAGNOSIS — Z1231 Encounter for screening mammogram for malignant neoplasm of breast: Secondary | ICD-10-CM | POA: Diagnosis not present

## 2018-09-09 NOTE — Progress Notes (Signed)
Cardiology Office Note:    Date:  09/11/2018   ID:  Deanna Simmons, DOB [birth year], MRN [id number]  PCP:  Deanna Huddle, MD  Cardiologist:  Deanna Grooms, MD   Referring MD: Deanna Huddle, MD   Chief Complaint  Patient presents with  . Cardiac Valve Problem  . Coronary Artery Disease    History of Present Illness:    Deanna Simmons is a 67 y.o. female with a hx of with CAD, prior coronary bypass grafting, essential hypertension, asthma, diabetes mellitus, recent cryptogenic stroke manifesting as right retinal embolus, moderate aortic stenosis, morbid obesity, and suspicion for sleep apnea.  All he is not getting in much walking.  Her left knee gives her difficulty and prevents is much movement that she would like to achieve.  She denies angina.  She has not needed to use nitroglycerin.  She does have some dyspnea on exertion that she feels could be related to deconditioning.  She does have occasional orthopnea.  She has been unable to lose weight.  She has felt no palpitations.  She has had no episodes of syncope and denies PND.   Past Medical History:  Diagnosis Date  . Abnormal finding on Pap smear, ASCUS   . Anemia   . Asthma   . Chondromalacia of knee   . Coronary artery disease   . Diabetes mellitus without complication ([place])   . Diverticulum 12/11/2002   in cecum  . Fibroids    h/o  . Glucose intolerance (impaired glucose tolerance)   . H/O elevated lipids   . H/O tinea cruris 02/2010  . History of ASCVD    s/p CABG  . Hypercholesteremia   . Hypertension   . Internal hemorrhoids    h/o  . Irregular menses    h/o  . MI (myocardial infarction) ([place]) age 69  . Obesity   . Pneumonia 07/2008  . Synovitis of knee   . Systemic lupus erythematosus ([place])    with nephritis  . Vulvitis    h/o    Past Surgical History:  Procedure Laterality Date  . CESAREAN SECTION    . CORONARY ARTERY BYPASS GRAFT    . Endometrial curettage  03/29/2000  . ROTATOR CUFF  REPAIR    . TUBAL LIGATION    . UPPER GASTROINTESTINAL ENDOSCOPY  12/11/2002    Current Medications: Current Meds  Medication Sig  . aspirin 81 MG tablet Take 81 mg by mouth daily.  . beclomethasone (QVAR REDIHALER) 80 MCG/ACT inhaler Take three inhalations three times daily during flare-up as directed.  Rinse, gargle, and spit after use.  . benzonatate (TESSALON) 100 MG capsule Take 1-2 capsules (100-200 mg total) by mouth 3 (three) times daily as needed for cough.  . budesonide-formoterol (SYMBICORT) 160-4.5 MCG/ACT inhaler INHALE 2 PUFFS INTO THE LUNGS 2 TIMES DAILY.  . cetirizine (ZYRTEC) 10 MG tablet Take 10 mg by mouth daily.  . Cholecalciferol (VITAMIN D3) 2000 UNITS TABS Take 1 tablet by mouth daily.  Marland Kitchen Dextromethorphan-Guaifenesin (MUCINEX DM PO) Take by mouth as needed. Reported on 03/29/2016  . fluticasone (FLONASE) 50 MCG/ACT nasal spray Place 2 sprays into the nose daily as needed. Nasal congestion  . Magnesium Oxide 500 MG TABS   . metFORMIN (GLUCOPHAGE) 500 MG tablet Take 500 mg by mouth daily.  . metoprolol tartrate (LOPRESSOR) 25 MG tablet Take 1 tablet (25 mg total) by mouth 2 (two) times daily.  . Multiple Vitamins-Minerals (MULTIVITAL PO) Take 1 tablet by mouth daily.  Marland Kitchen  nitroGLYCERIN (NITROSTAT) 0.4 MG SL tablet Place 0.4 mg under the tongue every 5 (five) minutes as needed for chest pain (3 doses MAX). Reported on 12/01/2015  . Probiotic Product (ULTRAFLORA IMMUNE HEALTH PO) Take 1 tablet by mouth at bedtime.  . rosuvastatin (CRESTOR) 40 MG tablet Take 1 tablet (40 mg total) by mouth daily.  . sodium chloride (OCEAN) 0.65 % SOLN nasal spray Place 1 spray into both nostrils as needed for congestion.  . TRUE METRIX BLOOD GLUCOSE TEST test strip   . TRUEPLUS LANCETS 30G MISC   . valACYclovir (VALTREX) 500 MG tablet Take 500 mg by mouth daily as needed (cold sores/blisters). Reported on 12/01/2015  . VENTOLIN HFA 108 (90 Base) MCG/ACT inhaler INHALE 2 PUFFS BY MOUTH EVERY 4-6  HOURS AS NEEDED FOR COUGH OR WHEEZE     Allergies:   Biaxin [clarithromycin]; Cardiolite [technetium-42m]; Ciprofloxacin; Erythromycin base; Montelukast sodium; Niaspan [niacin er]; Penicillins; Prilosec [omeprazole]; Singulair [montelukast]; and Ticlid [ticlopidine hcl]   Social History   Socioeconomic History  . Marital status: Married    Spouse name: Alvester Chou  . Number of children: 4  . Years of education: 12+  . Highest education level: Not on file  Occupational History  . Occupation: CARE MANAGEMENT AS[employer]    Employer: [employer]  . Financial resource strain: Not on file  . Food insecurity:    Worry: Not on file    Inability: Not on file  . Transportation needs:    Medical: Not on file    Non-medical: Not on file  Tobacco Use  . Smoking status: Former Research scientist (life sciences)  . Smokeless tobacco: Never Used  Substance and Sexual Activity  . Alcohol use: Yes    Comment: occasional  . Drug use: No  . Sexual activity: Not on file  Lifestyle  . Physical activity:    Days per week: Not on file    Minutes per session: Not on file  . Stress: Not on file  Relationships  . Social connections:    Talks on phone: Not on file    Gets together: Not on file    Attends religious service: Not on file    Active member of club or organization: Not on file    Attends meetings of clubs or organizations: Not on file    Relationship status: Not on file  Other Topics Concern  . Not on file  Social History Narrative   Lives with her husband. Their children are grown, 1 lives in [place], 1 in [place], [place],  1 in [place], [place], and one in [place], [place].     Family History: The patient's family history includes Breast cancer (age of onset: 9) in her mother; Diabetes in her father and mother; Heart attack in her paternal uncle; Hyperlipidemia in her father; Ovarian cancer in her cousin; Prostate cancer in her father; Stroke in her mother.  ROS:   Please see the history of present  illness.    Having reflux symptoms.  Stopped taking Zantac because of warning concerning potential for cancer production.  Difficulty ambulating because of left knee arthritis.  All other systems reviewed and are negative.  EKGs/Labs/Other Studies Reviewed:    The following studies were reviewed today:  Bilateral carotid Doppler 2017: Heterogeneous plaque, bilaterally. Stable 1-39% bilateral ICA stenosis. Normal subclavian arteries, bilaterally. Patent vertebral arteries with antegrade flow. PRN follow-up recommended.  2D Doppler echocardiogram performed in 2017: ------------------------------------------------------------------- Study Conclusions  - Left ventricle: The cavity size was mildly dilated.  Wall   thickness was increased in a pattern of mild LVH. Systolic   function was normal. The estimated ejection fraction was in the   range of 60% to 65%. The study is not technically sufficient to   allow evaluation of LV diastolic function. - Aortic valve: Stable gradients since 08/2015 when mean was 21   mmHg and peak was 48 mmHg. There was moderate stenosis. There was   mild regurgitation. - Mitral valve: Severely calcified, moderately thickened annulus.   There was mild regurgitation. - Left atrium: The atrium was mildly dilated. - Atrial septum: No defect or patent foramen ovale was identified. - Pulmonary arteries: PA peak pressure: 32 mm Hg (S).   EKG:  EKG is ordered today.  The ekg ordered today demonstrates left atrial abnormality, normal PR interval, poor R wave progression V1 through V3.  No changes noted when compared to prior tracings.  Recent Labs: No results found for requested labs within last 8760 hours.  Recent Lipid Panel    Component Value Date/Time   CHOL 120 03/11/2014 0841   TRIG 65.0 03/11/2014 0841   HDL 41.00 03/11/2014 0841   CHOLHDL 3 03/11/2014 0841   VLDL 13.0 03/11/2014 0841   LDLCALC 66 03/11/2014 0841    Physical Exam:    VS:  BP  136/74   Pulse 66   Ht 5\' 5"  (1.651 m)   Wt 247 lb 6.4 oz (112.2 kg)   SpO2 97%   BMI 41.17 kg/m     Wt Readings from Last 3 Encounters:  09/11/18 247 lb 6.4 oz (112.2 kg)  08/02/17 249 lb 6.4 oz (113.1 kg)  09/06/16 248 lb (112.5 kg)     GEN:  Well nourished, well developed in no acute distress HEENT: Normal NECK: No JVD. LYMPHATICS: No lymphadenopathy CARDIAC: RRR, 3/6 systolic murmur, no gallop, no edema. VASCULAR: No pulses.  No bruits. RESPIRATORY:  Clear to auscultation without rales, wheezing or rhonchi  ABDOMEN: Soft, non-tender, non-distended, No pulsatile mass, MUSCULOSKELETAL: No deformity  SKIN: Warm and dry NEUROLOGIC:  Alert and oriented x 3 PSYCHIATRIC:  Normal affect   ASSESSMENT:    1. Coronary artery disease of bypass graft of native heart with stable angina pectoris ([place])   2. Hypercholesteremia   3. Essential hypertension   4. Moderate aortic stenosis   5. Right carotid bruit    PLAN:    In order of problems listed above:  1. Stable without angina pectoris.  We discussed secondary risk prevention.  She is delinquent/lacking moderate physical activity.  We discussed alternative to walking as she has a left knee situation that prevents dependable activity. 2. Statin therapy to keep LDL less than 70.  Most recent LDL was 81. 3. Blood pressure target 130/80 mmHg.  Reiterated salt restriction. 4. Significant murmur is heard.  2 years ago she had moderate aortic stenosis.  Repeat 2D Doppler echocardiogram.  We discussed the natural history of aortic stenosis including treatment options, TAVR versus SAVR. 5. Does have a right carotid bruit.  Likely contributed to by aortic stenosis.  2017 PRN follow-up was recommended by Doppler.  Please see above.  Overall education and awareness concerning primary/secondary risk prevention was discussed in detail: LDL less than 70, hemoglobin A1c less than 7, blood pressure target less than 130/80 mmHg, >150 minutes of  moderate aerobic activity per week, avoidance of smoking, weight control (via diet and exercise), and continued surveillance/management of/for obstructive sleep apnea.   Natural history of aortic valve disease/stenosis  was discussed in detail.  Cardinal symptoms of angina, syncope, and dyspnea were reviewed and significance relative to prognosis was described.  The importance of sequential imaging for for disease monitoring was emphasized.  Work-up including possible heart catheterization, and CT angiography were described as essential components and staging for therapy.  Treatment options, TAVR and SAVR, were discussed.  Greater than 50% of the time during this office visit was spent in education, counseling, and coordination of care related to underlying disease process and testing as outlined.    Medication Adjustments/Labs and Tests Ordered: Current medicines are reviewed at length with the patient today.  Concerns regarding medicines are outlined above.  Orders Placed This Encounter  Procedures  . EKG 12-Lead  . ECHOCARDIOGRAM COMPLETE   No orders of the defined types were placed in this encounter.   Patient Instructions  Medication Instructions:  Your physician recommends that you continue on your current medications as directed. Please refer to the Current Medication list given to you today.  If you need a refill on your cardiac medications before your next appointment, please call your pharmacy.   Lab work: None If you have labs (blood work) drawn today and your tests are completely normal, you will receive your results only by: Marland Kitchen MyChart Message (if you have MyChart) OR . A paper copy in the mail If you have any lab test that is abnormal or we need to change your treatment, we will call you to review the results.  Testing/Procedures: Your physician has requested that you have an echocardiogram. Echocardiography is a painless test that uses sound waves to create images of  your heart. It provides your doctor with information about the size and shape of your heart and how well your heart's chambers and valves are working. This procedure takes approximately one hour. There are no restrictions for this procedure.   Follow-Up: At [facility], you and your health needs are our priority.  As part of our continuing mission to provide you with exceptional heart care, we have created designated Provider Care Teams.  These Care Teams include your primary Cardiologist (physician) and Advanced Practice Providers (APPs -  Physician Assistants and Nurse Practitioners) who all work together to provide you with the care you need, when you need it. You will need a follow up appointment in 12 months.  Please call our office 2 months in advance to schedule this appointment.  You may see Deanna Grooms, MD or one of the following Advanced Practice Providers on your designated Care Team:   Truitt Merle, NP Cecilie Kicks, NP . Kathyrn Drown, NP  Any Other Special Instructions Will Be Listed Below (If Applicable).  Your physician discussed the importance of regular exercise and recommended that you start or continue a regular exercise program for good health.  He recommends participating in 150 minutes of moderate activity per week.        Signed, Deanna Grooms, MD  09/11/2018 11:20 AM    [place]

## 2018-09-11 ENCOUNTER — Ambulatory Visit (INDEPENDENT_AMBULATORY_CARE_PROVIDER_SITE_OTHER): Payer: 59 | Admitting: Interventional Cardiology

## 2018-09-11 ENCOUNTER — Encounter: Payer: Self-pay | Admitting: Interventional Cardiology

## 2018-09-11 VITALS — BP 136/74 | HR 66 | Ht 65.0 in | Wt 247.4 lb

## 2018-09-11 DIAGNOSIS — E7800 Pure hypercholesterolemia, unspecified: Secondary | ICD-10-CM

## 2018-09-11 DIAGNOSIS — R0989 Other specified symptoms and signs involving the circulatory and respiratory systems: Secondary | ICD-10-CM

## 2018-09-11 DIAGNOSIS — I10 Essential (primary) hypertension: Secondary | ICD-10-CM

## 2018-09-11 DIAGNOSIS — I25708 Atherosclerosis of coronary artery bypass graft(s), unspecified, with other forms of angina pectoris: Secondary | ICD-10-CM

## 2018-09-11 DIAGNOSIS — I350 Nonrheumatic aortic (valve) stenosis: Secondary | ICD-10-CM

## 2018-09-11 NOTE — Patient Instructions (Addendum)
Medication Instructions:  Your physician recommends that you continue on your current medications as directed. Please refer to the Current Medication list given to you today.  If you need a refill on your cardiac medications before your next appointment, please call your pharmacy.   Lab work: None If you have labs (blood work) drawn today and your tests are completely normal, you will receive your results only by: Marland Kitchen MyChart Message (if you have MyChart) OR . A paper copy in the mail If you have any lab test that is abnormal or we need to change your treatment, we will call you to review the results.  Testing/Procedures: Your physician has requested that you have an echocardiogram. Echocardiography is a painless test that uses sound waves to create images of your heart. It provides your doctor with information about the size and shape of your heart and how well your heart's chambers and valves are working. This procedure takes approximately one hour. There are no restrictions for this procedure.   Follow-Up: At [facility], you and your health needs are our priority.  As part of our continuing mission to provide you with exceptional heart care, we have created designated Provider Care Teams.  These Care Teams include your primary Cardiologist (physician) and Advanced Practice Providers (APPs -  Physician Assistants and Nurse Practitioners) who all work together to provide you with the care you need, when you need it. You will need a follow up appointment in 12 months.  Please call our office 2 months in advance to schedule this appointment.  You may see Sinclair Grooms, MD or one of the following Advanced Practice Providers on your designated Care Team:   Truitt Merle, NP Cecilie Kicks, NP . Kathyrn Drown, NP  Any Other Special Instructions Will Be Listed Below (If Applicable).  Your physician discussed the importance of regular exercise and recommended that you start or continue a  regular exercise program for good health.  He recommends participating in 150 minutes of moderate activity per week.

## 2018-09-12 ENCOUNTER — Other Ambulatory Visit: Payer: Self-pay | Admitting: Interventional Cardiology

## 2018-09-13 DIAGNOSIS — Z01411 Encounter for gynecological examination (general) (routine) with abnormal findings: Secondary | ICD-10-CM | POA: Diagnosis not present

## 2018-09-13 DIAGNOSIS — D259 Leiomyoma of uterus, unspecified: Secondary | ICD-10-CM | POA: Diagnosis not present

## 2018-09-13 DIAGNOSIS — Z124 Encounter for screening for malignant neoplasm of cervix: Secondary | ICD-10-CM | POA: Diagnosis not present

## 2018-09-13 DIAGNOSIS — Z6841 Body Mass Index (BMI) 40.0 and over, adult: Secondary | ICD-10-CM | POA: Diagnosis not present

## 2018-09-13 DIAGNOSIS — N952 Postmenopausal atrophic vaginitis: Secondary | ICD-10-CM | POA: Diagnosis not present

## 2018-09-13 DIAGNOSIS — R3915 Urgency of urination: Secondary | ICD-10-CM | POA: Diagnosis not present

## 2018-09-13 DIAGNOSIS — I10 Essential (primary) hypertension: Secondary | ICD-10-CM | POA: Diagnosis not present

## 2018-09-13 DIAGNOSIS — N644 Mastodynia: Secondary | ICD-10-CM | POA: Diagnosis not present

## 2018-09-13 MED FILL — ESTRADIOL 10 MCG TABS: 10 | 28 days supply | Qty: 8 | Fill #0

## 2018-09-17 ENCOUNTER — Ambulatory Visit (HOSPITAL_COMMUNITY): Payer: 59 | Attending: Cardiology

## 2018-09-17 ENCOUNTER — Other Ambulatory Visit: Payer: Self-pay

## 2018-09-17 DIAGNOSIS — I350 Nonrheumatic aortic (valve) stenosis: Secondary | ICD-10-CM | POA: Insufficient documentation

## 2018-09-18 DIAGNOSIS — I10 Essential (primary) hypertension: Secondary | ICD-10-CM | POA: Diagnosis not present

## 2018-09-18 DIAGNOSIS — J309 Allergic rhinitis, unspecified: Secondary | ICD-10-CM | POA: Diagnosis not present

## 2018-09-18 DIAGNOSIS — E559 Vitamin D deficiency, unspecified: Secondary | ICD-10-CM | POA: Diagnosis not present

## 2018-09-18 DIAGNOSIS — I2510 Atherosclerotic heart disease of native coronary artery without angina pectoris: Secondary | ICD-10-CM | POA: Diagnosis not present

## 2018-09-18 DIAGNOSIS — Z0000 Encounter for general adult medical examination without abnormal findings: Secondary | ICD-10-CM | POA: Diagnosis not present

## 2018-09-18 DIAGNOSIS — E785 Hyperlipidemia, unspecified: Secondary | ICD-10-CM | POA: Diagnosis not present

## 2018-09-18 DIAGNOSIS — J45909 Unspecified asthma, uncomplicated: Secondary | ICD-10-CM | POA: Diagnosis not present

## 2018-09-18 DIAGNOSIS — Z79899 Other long term (current) drug therapy: Secondary | ICD-10-CM | POA: Diagnosis not present

## 2018-09-18 DIAGNOSIS — E119 Type 2 diabetes mellitus without complications: Secondary | ICD-10-CM | POA: Diagnosis not present

## 2018-10-02 MED FILL — PANTOPRAZOLE SOD DR 40 MG T: 40 | 86 days supply | Qty: 37 | Fill #0

## 2018-10-11 DIAGNOSIS — M65311 Trigger thumb, right thumb: Secondary | ICD-10-CM | POA: Diagnosis not present

## 2018-11-08 ENCOUNTER — Ambulatory Visit: Payer: Self-pay | Admitting: Podiatry

## 2018-11-09 ENCOUNTER — Other Ambulatory Visit: Payer: Self-pay | Admitting: Allergy and Immunology

## 2018-11-09 MED FILL — PANTOPRAZOLE SOD DR 40 MG T: 40 | 84 days supply | Qty: 36 | Fill #0

## 2018-11-09 MED FILL — SYMBICORT 160-4.5 MCG INH: 160-4.5 | 30 days supply | Qty: 10 | Fill #2

## 2018-11-26 ENCOUNTER — Other Ambulatory Visit: Payer: Self-pay | Admitting: Interventional Cardiology

## 2018-11-26 MED FILL — METOPROLOL TARTRATE 25 MG T: 25 | 90 days supply | Qty: 180 | Fill #0

## 2018-11-26 MED FILL — ROSUVASTATIN CALCIUM 40 MG: 40 | 90 days supply | Qty: 90 | Fill #0

## 2018-11-27 ENCOUNTER — Ambulatory Visit: Payer: Self-pay | Admitting: Sports Medicine

## 2018-11-27 MED FILL — ACCU-CHEK GUIDE TEST STRIP: 90 days supply | Qty: 200 | Fill #0

## 2018-11-27 MED FILL — ACCU-CHEK FASTCLIX LANCETS: 90 days supply | Qty: 204 | Fill #0

## 2018-11-27 MED FILL — metFORMIN HCL ER 500 MG TB2: 500 | 90 days supply | Qty: 180 | Fill #2

## 2018-11-28 MED FILL — EZETIMIBE 10 MG TABS: 10 | 90 days supply | Qty: 90 | Fill #0

## 2018-12-11 ENCOUNTER — Ambulatory Visit: Payer: 59 | Admitting: Sports Medicine

## 2018-12-11 ENCOUNTER — Encounter: Payer: Self-pay | Admitting: Sports Medicine

## 2018-12-11 DIAGNOSIS — M21619 Bunion of unspecified foot: Secondary | ICD-10-CM

## 2018-12-11 DIAGNOSIS — E119 Type 2 diabetes mellitus without complications: Secondary | ICD-10-CM | POA: Diagnosis not present

## 2018-12-11 DIAGNOSIS — M79674 Pain in right toe(s): Secondary | ICD-10-CM

## 2018-12-11 DIAGNOSIS — M2041 Other hammer toe(s) (acquired), right foot: Secondary | ICD-10-CM | POA: Diagnosis not present

## 2018-12-11 DIAGNOSIS — L84 Corns and callosities: Secondary | ICD-10-CM

## 2018-12-11 DIAGNOSIS — N951 Menopausal and female climacteric states: Secondary | ICD-10-CM | POA: Insufficient documentation

## 2018-12-11 DIAGNOSIS — N644 Mastodynia: Secondary | ICD-10-CM | POA: Insufficient documentation

## 2018-12-11 DIAGNOSIS — J45909 Unspecified asthma, uncomplicated: Secondary | ICD-10-CM | POA: Insufficient documentation

## 2018-12-11 NOTE — Progress Notes (Signed)
Subjective: LATOI GIRALDO is a 68 y.o. female patient who presents to office for evaluation of Right second toe pain secondary to callus skin. Patient complains of pain at the lesion present Right second toe and reports that it has slowly came back and never went away since last visit. Patient has tried cushions with no relief in symptoms. Patient denies any other pedal complaints.   Patient is diabetic and reports her last A1c was around 7 and prior last blood sugar this morning was 128 and last saw her primary care doctor.  Patient Active Problem List   Diagnosis Date Noted  . Asthma 12/11/2018  . Menopausal symptom 12/11/2018  . Pain of breast 12/11/2018  . Type 2 diabetes mellitus ([place]) 12/11/2018  . Hammer toe of right foot 06/28/2018  . Corns and callosities 06/28/2018  . Bunion 06/28/2018  . Chronic pain of left knee 09/06/2016  . Right carotid bruit 08/03/2015  . Moderate aortic stenosis 07/18/2014  . CAD (coronary artery disease) of artery bypass graft   . Hypercholesteremia   . Obesity   . Anemia   . Chondromalacia of knee   . Synovitis of knee   . Hypertension   . Fibroids   . Internal hemorrhoids   . Diverticulum   . Vulvitis   . Old MI (myocardial infarction)     Current Outpatient Medications on File Prior to Visit  Medication Sig Dispense Refill  . aspirin 81 MG tablet Take 81 mg by mouth daily.    . beclomethasone (QVAR REDIHALER) 80 MCG/ACT inhaler Take three inhalations three times daily during flare-up as directed.  Rinse, gargle, and spit after use. 1 Inhaler 3  . benzonatate (TESSALON) 100 MG capsule Take 1-2 capsules (100-200 mg total) by mouth 3 (three) times daily as needed for cough. 40 capsule 0  . budesonide-formoterol (SYMBICORT) 160-4.5 MCG/ACT inhaler INHALE 2 PUFFS INTO THE LUNGS 2 TIMES DAILY. 10.2 g 5  . cetirizine (ZYRTEC) 10 MG tablet Take 10 mg by mouth daily.    . Cholecalciferol (VITAMIN D3) 2000 UNITS TABS Take 1 tablet by mouth daily.  With Vitamin K2 in it    . Dextromethorphan-Guaifenesin (MUCINEX DM PO) Take by mouth as needed. Reported on 03/29/2016    . ezetimibe (ZETIA) 10 MG tablet TAKE 1 TABLET BY MOUTH DAILY. 90 tablet 3  . fluticasone (FLONASE) 50 MCG/ACT nasal spray INSTILL 1 TO 2 SPRAYS INTO EACH NOSTRIL DAILY FOR STUFFY NOSE OR DRAINAGE 48 g 5  . Magnesium Oxide 500 MG TABS   3  . metFORMIN (GLUCOPHAGE) 500 MG tablet Take 500 mg by mouth daily.    . metoprolol tartrate (LOPRESSOR) 25 MG tablet TAKE 1 TABLET BY MOUTH 2 TIMES DAILY. 180 tablet 3  . Multiple Vitamins-Minerals (MULTIVITAL PO) Take 1 tablet by mouth daily.    . nitroGLYCERIN (NITROSTAT) 0.4 MG SL tablet Place 0.4 mg under the tongue every 5 (five) minutes as needed for chest pain (3 doses MAX). Reported on 12/01/2015    . Probiotic Product (ULTRAFLORA IMMUNE HEALTH PO) Take 1 tablet by mouth at bedtime.    . rosuvastatin (CRESTOR) 40 MG tablet TAKE 1 TABLET BY MOUTH DAILY. 90 tablet 3  . sodium chloride (OCEAN) 0.65 % SOLN nasal spray Place 1 spray into both nostrils as needed for congestion.    . TRUE METRIX BLOOD GLUCOSE TEST test strip   3  . TRUEPLUS LANCETS 30G MISC   3  . valACYclovir (VALTREX) 500 MG tablet Take 500  mg by mouth daily as needed (cold sores/blisters). Reported on 12/01/2015    . VENTOLIN HFA 108 (90 Base) MCG/ACT inhaler INHALE 2 PUFFS BY MOUTH EVERY 4-6 HOURS AS NEEDED FOR COUGH OR WHEEZE 18 g 1   No current facility-administered medications on file prior to visit.     Allergies  Allergen Reactions  . Biaxin [Clarithromycin] Itching  . Cardiolite [Technetium-35m] Itching  . Ciprofloxacin Itching  . Erythromycin Base Itching  . Montelukast Sodium Itching  . Niaspan [Niacin Er] Other (See Comments)    flushing  . Penicillins Itching  . Prilosec [Omeprazole] Itching  . Singulair [Montelukast]   . Ticlid [Ticlopidine Hcl] Itching    Objective:  General: Alert and oriented x3 in no acute distress  Dermatology: Keratotic  lesion present over second proximal interphalangeal joint skin lines transversing the lesion, pain is present with direct pressure to the lesion with a central nucleated core noted, no webspace macerations, no ecchymosis bilateral, all nails x 10 are well manicured.  Vascular: Dorsalis Pedis and Posterior Tibial pedal pulses 1/4, Capillary Fill Time 3 seconds, + pedal hair growth bilateral, no edema bilateral lower extremities, Temperature gradient within normal limits.  Neurology: Johney Maine sensation intact via light touch bilateral.  Musculoskeletal: Mild tenderness with palpation at the keratotic lesion site on Right second hammertoe which is secondary to grade 4 bunion with crossover of hallux and lesser hammertoe secondary to crowding, pes planus foot type.  Muscular strength 5/5 in all groups without pain or limitation on range of motion.  Assessment and Plan: Problem List Items Addressed This Visit      Musculoskeletal and Integument   Hammer toe of right foot   Bunion     Other   Corns and callosities - Primary    Other Visit Diagnoses    Toe pain, right       Diabetes mellitus without complication ([place])         -Complete examination performed -Discussed treatment options for corn and advised patient that her changes are secondary to her mechanical issues of bunions and hammertoes and if this is not corrected the corn will continue at the second toe; reviewed previous x-rays and thoroughly explained the mechanics of her foot structure and how this is contributing to her corn at the second toe -Parred keratoic lesion using a chisel blade without incident -Dispensed silicone digital pads to use and advised wider shoes that give her proper support and more space to prevent rubbing especially at elevated second toe due to bunion and crowding with crossover -Encouraged daily skin emollients -Encouraged use of pumice stone and to avoid any sharp trimming objects or any corn or acid  pad -Patient to return to office when she has made a decision about surgery or sooner if condition worsens.  Landis Martins, DPM

## 2018-12-18 DIAGNOSIS — H34211 Partial retinal artery occlusion, right eye: Secondary | ICD-10-CM | POA: Diagnosis not present

## 2018-12-18 DIAGNOSIS — H2513 Age-related nuclear cataract, bilateral: Secondary | ICD-10-CM | POA: Diagnosis not present

## 2018-12-18 DIAGNOSIS — E119 Type 2 diabetes mellitus without complications: Secondary | ICD-10-CM | POA: Diagnosis not present

## 2018-12-19 DIAGNOSIS — N181 Chronic kidney disease, stage 1: Secondary | ICD-10-CM | POA: Diagnosis not present

## 2018-12-19 DIAGNOSIS — I2510 Atherosclerotic heart disease of native coronary artery without angina pectoris: Secondary | ICD-10-CM | POA: Diagnosis not present

## 2018-12-19 DIAGNOSIS — E785 Hyperlipidemia, unspecified: Secondary | ICD-10-CM | POA: Diagnosis not present

## 2018-12-19 DIAGNOSIS — M3214 Glomerular disease in systemic lupus erythematosus: Secondary | ICD-10-CM | POA: Diagnosis not present

## 2018-12-19 DIAGNOSIS — D649 Anemia, unspecified: Secondary | ICD-10-CM | POA: Diagnosis not present

## 2018-12-19 DIAGNOSIS — I129 Hypertensive chronic kidney disease with stage 1 through stage 4 chronic kidney disease, or unspecified chronic kidney disease: Secondary | ICD-10-CM | POA: Diagnosis not present

## 2018-12-19 DIAGNOSIS — M1990 Unspecified osteoarthritis, unspecified site: Secondary | ICD-10-CM | POA: Diagnosis not present

## 2018-12-19 DIAGNOSIS — E119 Type 2 diabetes mellitus without complications: Secondary | ICD-10-CM | POA: Diagnosis not present

## 2018-12-19 DIAGNOSIS — Z889 Allergy status to unspecified drugs, medicaments and biological substances status: Secondary | ICD-10-CM | POA: Diagnosis not present

## 2018-12-27 DIAGNOSIS — R1012 Left upper quadrant pain: Secondary | ICD-10-CM | POA: Diagnosis not present

## 2018-12-27 DIAGNOSIS — K5730 Diverticulosis of large intestine without perforation or abscess without bleeding: Secondary | ICD-10-CM | POA: Diagnosis not present

## 2018-12-27 DIAGNOSIS — K219 Gastro-esophageal reflux disease without esophagitis: Secondary | ICD-10-CM | POA: Diagnosis not present

## 2019-01-07 DIAGNOSIS — S93492A Sprain of other ligament of left ankle, initial encounter: Secondary | ICD-10-CM | POA: Diagnosis not present

## 2019-01-18 ENCOUNTER — Telehealth: Payer: Self-pay | Admitting: Allergy and Immunology

## 2019-01-18 NOTE — Telephone Encounter (Signed)
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

## 2019-01-18 NOTE — Telephone Encounter (Signed)
Dr .Kozlow, please advise.  

## 2019-01-21 NOTE — Telephone Encounter (Signed)
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

## 2019-01-21 NOTE — Telephone Encounter (Signed)
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

## 2019-01-21 NOTE — Telephone Encounter (Signed)
Pt calling back asking about leave of absence letter.  Please advise on what should be written on the letter.

## 2019-01-22 ENCOUNTER — Telehealth: Payer: Self-pay | Admitting: Allergy and Immunology

## 2019-01-22 NOTE — Telephone Encounter (Signed)
A letter has been generated and has been placed up front for the patient to pick up. Called and spoke with patient and advised. Patient verbalized understanding and will be coming to pick up letter.

## 2019-01-22 NOTE — Telephone Encounter (Signed)
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 She said if a letter is written and she needs to pick it up, here, outside, she can.

## 2019-01-23 ENCOUNTER — Telehealth: Payer: Self-pay | Admitting: Allergy and Immunology

## 2019-01-23 NOTE — Telephone Encounter (Signed)
Paperwork has been received. Will have completed and faxed back to [employer].

## 2019-01-23 NOTE — Telephone Encounter (Signed)
Waiting for [employer] paperwork to be faxed.

## 2019-01-23 NOTE — Telephone Encounter (Signed)
Patient is calling about a leave of absence she has taken from work due to high risk from [place] Patient states the [employer] will be sending over paperwork for her She has told her employer that she will not be back until the ned of April due to the virus and them not having a work from home status for her Please contact the patient with any questions Patient stated the end of April - but did not give an actual date on when her leave of absence has started

## 2019-01-23 NOTE — Telephone Encounter (Signed)
A letter has been placed up front for the patient to come pick up.

## 2019-01-23 NOTE — Telephone Encounter (Signed)
Called and spoke with patient and advised that as soon as the [employer] paperwork is received it will be worked on accordingly. Patient verbalized understanding.

## 2019-01-24 NOTE — Telephone Encounter (Signed)
[employer] paperwork has been completed, signed, and faxed to [employer]. Paperwork has been labeled and placed in bulk scanning in the [place] office. Called patient and informed, patient verbalized understanding. An appointment has been made for the patient to come for an office visit this summer for follow up.

## 2019-02-04 ENCOUNTER — Telehealth: Payer: Self-pay

## 2019-02-04 NOTE — Telephone Encounter (Signed)
Patient called stating that she received a call from [employer] stating that they had not received FMLA paperwork.  Informed patient that one of the nurses had faxed this to them on 3/25.  I did inform her that copies have been sent to the scan center so we do not currently have access to them.  She is going to have [employer] fax more forms, because she believes the papers that we filled out on 3/25 were denied and we were going to have to fill additional forms out. Informed patient that I would monitor fax machine for forms.

## 2019-02-05 NOTE — Telephone Encounter (Signed)
Forms are in the chart now. I am going to fax them again to [employer]. I will leave the printed copies in the nurse/cma station in [place] until we can verify receipt.

## 2019-02-06 NOTE — Telephone Encounter (Signed)
New FMLA forms filled out for intermitted fmla after 03/01/2019 to 02/29/2020. Heather had forms to fax to [employer] awaiting response

## 2019-02-07 NOTE — Telephone Encounter (Signed)
Forms were faxed to [employer] via fax number provided on forms. Corrected dates and sent forms again

## 2019-02-18 MED FILL — VALACYCLOVIR HCL 500 MG TAB: 500 | 30 days supply | Qty: 60 | Fill #0

## 2019-02-19 ENCOUNTER — Other Ambulatory Visit: Payer: Self-pay | Admitting: Interventional Cardiology

## 2019-02-19 MED FILL — EZETIMIBE 10 MG TABS: 10 | 90 days supply | Qty: 90 | Fill #1

## 2019-02-19 MED FILL — METOPROLOL TARTRATE 25 MG T: 25 | 90 days supply | Qty: 180 | Fill #1

## 2019-02-26 NOTE — Telephone Encounter (Signed)
Patient is calling back today Patient is wondering if she should go back to work on MAY 4 as her Paperwork states - or does Dr Neldon Mc feel that it is safe for her to go back at this time?? should she extend her leave - or attempt to go back to work  Please call patient to answer any questions

## 2019-02-28 MED FILL — ACCU-CHEK GUIDE TEST STRIP: 90 days supply | Qty: 200 | Fill #1

## 2019-02-28 MED FILL — ACCU-CHEK FASTCLIX LANCETS: 90 days supply | Qty: 204 | Fill #1

## 2019-02-28 NOTE — Telephone Encounter (Signed)
Dr. Neldon Mc please advise on patient FMLA?

## 2019-02-28 NOTE — Telephone Encounter (Signed)
Informed patient to remain out of work until June 1st.

## 2019-02-28 NOTE — Telephone Encounter (Signed)
Left message for patient to call office. Refaxed FMLA forms with extended date.

## 2019-02-28 NOTE — Telephone Encounter (Signed)
Lets extend to June 1st.

## 2019-03-04 DIAGNOSIS — N181 Chronic kidney disease, stage 1: Secondary | ICD-10-CM | POA: Diagnosis not present

## 2019-03-07 ENCOUNTER — Encounter: Payer: Self-pay | Admitting: Allergy

## 2019-03-07 ENCOUNTER — Telehealth: Payer: Self-pay | Admitting: Allergy and Immunology

## 2019-03-07 ENCOUNTER — Ambulatory Visit: Payer: 59 | Admitting: Allergy

## 2019-03-07 ENCOUNTER — Other Ambulatory Visit: Payer: Self-pay

## 2019-03-07 VITALS — BP 112/60 | HR 91 | Resp 16 | Ht 65.0 in

## 2019-03-07 DIAGNOSIS — K219 Gastro-esophageal reflux disease without esophagitis: Secondary | ICD-10-CM | POA: Diagnosis not present

## 2019-03-07 DIAGNOSIS — J4540 Moderate persistent asthma, uncomplicated: Secondary | ICD-10-CM | POA: Diagnosis not present

## 2019-03-07 DIAGNOSIS — J3089 Other allergic rhinitis: Secondary | ICD-10-CM | POA: Insufficient documentation

## 2019-03-07 DIAGNOSIS — J302 Other seasonal allergic rhinitis: Secondary | ICD-10-CM | POA: Diagnosis not present

## 2019-03-07 NOTE — Telephone Encounter (Signed)
Dr Kozlow please advise 

## 2019-03-07 NOTE — Telephone Encounter (Signed)
Called and spoke with the patient and Deanna Simmons informed that Deanna Simmons did speak with HR on 03/05/2019 and they stated that they are no longer in critical status and that since Deanna Simmons is not having an asthma flare Deanna Simmons cannot be granted FMLA in order to cover her job security. Informed patient that I'm not sure what else we may be able to do but that would reach out to Dr. Neldon Mc to inform. Please advise.

## 2019-03-07 NOTE — Progress Notes (Signed)
Follow Up Note  RE: Deanna Simmons MRN: [id number] DOB: 07/27/1951 Date of Office Visit: 03/07/2019  Referring provider: Josetta Huddle, MD Primary care provider: Josetta Huddle, MD  Chief Complaint: Asthma  History of Present Illness: I had the pleasure of seeing Deanna Simmons for a follow up visit at the [facility] on 03/07/2019. She is a 68 y.o. female, who is being followed for asthma, allergic rhinitis, LPRD. Today she is here for regular follow up visit. Her previous allergy office visit was on 07/24/2018 with Dr. Neldon Mc.   Patient works at [employer] and usually sits in the office with other people with minimal patient contact. 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   Asthma: Currently on Symbicort 160 2 puffs once a day and doing well. Denies any SOB, coughing, wheezing, chest tightness, nocturnal awakenings, ER/urgent care visits or prednisone use since the last visit. No recent albuterol use.  Allergic rhinitis: Currently zyrtec 10mg  daily, Flonase 1 spray daily and saline spray with good benefit. No issues with the eyes.   LPRD: Stopped zantac a few months ago with some worsening symptoms. Did not take Pepcid yet.   Assessment and Plan: Deanna Simmons is a 68 y.o. female with: Asthma, well controlled Well-controlled with below regimen.  Today's spirometry showed mild obstruction.  Take Symbicort 160 2 puffs BID for the next 1-2 weeks then follow below schedule:   Daily controller medication(s): Symbicort 160 2 puffs once a day with spacer and rinse mouth afterwards.  Prior to physical activity: May use albuterol rescue inhaler 2 puffs 5 to 15 minutes prior to strenuous physical activities.  Rescue medications: May use albuterol rescue inhaler 2 puffs or nebulizer every 4 to 6 hours as needed for shortness of breath, chest tightness, coughing, and wheezing. Monitor frequency  of use.   During upper respiratory infections: Start Symbicort 160 2 puffs twice a day for 1-2 weeks.   o May additionally add Qvar 80 2 puffs twice a day for 1-2 weeks.  Seasonal and perennial allergic rhinitis Past history - 2012 testing was positive to grass, ragweed, weed, trees, mold.  Interim history - stable with below regimen.  Continue zyrtec 10mg  daily.  Nasal saline spray (i.e., Simply Saline) or nasal saline lavage (i.e., NeilMed) is recommended as needed and prior to medicated nasal sprays.  May use Flonase 1 spray daily.  Continue environmental control measures.   LPRD (laryngopharyngeal reflux disease) Stopped zantac due to recall and has not started Pepcid. Some worsening heartburn symptoms.  Take pepcid 20mg  daily.  Return in about 4 months (around 07/08/2019).  Diagnostics: Spirometry:  Tracings reviewed. Her effort: Good reproducible efforts. FVC: 2.83L FEV1: 1.88L, 95% predicted FEV1/FVC ratio: 66% Interpretation: Spirometry consistent with mild obstructive disease.  Please see scanned spirometry results for details.  Medication List:  Current Outpatient Medications  Medication Sig Dispense Refill   aspirin 81 MG tablet Take 81 mg by mouth daily.     beclomethasone (QVAR REDIHALER) 80 MCG/ACT inhaler Take three inhalations three times daily during flare-up as directed.  Rinse, gargle, and spit after use. 1 Inhaler 3   benzonatate (TESSALON) 100 MG capsule Take 1-2 capsules (100-200 mg total) by mouth 3 (three) times daily as needed for cough. 40 capsule 0   budesonide-formoterol (SYMBICORT) 160-4.5 MCG/ACT inhaler INHALE 2 PUFFS INTO THE LUNGS 2 TIMES DAILY. 10.2 g 5   cetirizine (ZYRTEC)  10 MG tablet Take 10 mg by mouth daily.     Cholecalciferol (VITAMIN D3) 2000 UNITS TABS Take 1 tablet by mouth daily. With Vitamin K2 in it     Dextromethorphan-Guaifenesin (MUCINEX DM PO) Take by mouth as needed. Reported on 03/29/2016     ezetimibe (ZETIA) 10 MG  tablet TAKE 1 TABLET BY MOUTH DAILY. 90 tablet 3   fluticasone (FLONASE) 50 MCG/ACT nasal spray INSTILL 1 TO 2 SPRAYS INTO EACH NOSTRIL DAILY FOR STUFFY NOSE OR DRAINAGE 48 g 5   Magnesium Oxide 500 MG TABS   3   metFORMIN (GLUCOPHAGE) 500 MG tablet Take 500 mg by mouth daily.     metoprolol tartrate (LOPRESSOR) 25 MG tablet TAKE 1 TABLET BY MOUTH 2 TIMES DAILY. 180 tablet 3   Multiple Vitamins-Minerals (MULTIVITAL PO) Take 1 tablet by mouth daily.     nitroGLYCERIN (NITROSTAT) 0.4 MG SL tablet Place 0.4 mg under the tongue every 5 (five) minutes as needed for chest pain (3 doses MAX). Reported on 12/01/2015     Probiotic Product (ULTRAFLORA IMMUNE HEALTH PO) Take 1 tablet by mouth at bedtime.     rosuvastatin (CRESTOR) 40 MG tablet TAKE 1 TABLET BY MOUTH DAILY. 90 tablet 3   sodium chloride (OCEAN) 0.65 % SOLN nasal spray Place 1 spray into both nostrils as needed for congestion.     TRUE METRIX BLOOD GLUCOSE TEST test strip   3   TRUEPLUS LANCETS 30G MISC   3   valACYclovir (VALTREX) 500 MG tablet Take 500 mg by mouth daily as needed (cold sores/blisters). Reported on 12/01/2015     VENTOLIN HFA 108 (90 Base) MCG/ACT inhaler INHALE 2 PUFFS BY MOUTH EVERY 4-6 HOURS AS NEEDED FOR COUGH OR WHEEZE 18 g 1   No current facility-administered medications for this visit.    Allergies: Allergies  Allergen Reactions   Biaxin [Clarithromycin] Itching   Cardiolite [Technetium-29m] Itching   Ciprofloxacin Itching   Erythromycin Base Itching   Montelukast Sodium Itching   Niaspan [Niacin Er] Other (See Comments)    flushing   Penicillins Itching   Prilosec [Omeprazole] Itching   Singulair [Montelukast]    Ticlid [Ticlopidine Hcl] Itching   I reviewed her past medical history, social history, family history, and environmental history and no significant changes have been reported from previous visit on 07/24/2018.  Review of Systems  Constitutional: Negative for appetite  change, chills, fever and unexpected weight change.  HENT: Negative for congestion and rhinorrhea.   Eyes: Negative for itching.  Respiratory: Negative for cough, chest tightness, shortness of breath and wheezing.   Gastrointestinal: Negative for abdominal pain.  Skin: Negative for rash.  Allergic/Immunologic: Positive for environmental allergies.  Neurological: Negative for headaches.   Objective: BP 112/60    Pulse 91    Resp 16    Ht 5\' 5"  (1.651 m)    SpO2 97%    BMI 41.17 kg/m  Body mass index is 41.17 kg/m. Physical Exam  Constitutional: She is oriented to person, place, and time. She appears well-developed and well-nourished.  HENT:  Head: Normocephalic and atraumatic.  Right Ear: External ear normal.  Left Ear: External ear normal.  Nose: Nose normal.  Mouth/Throat: Oropharynx is clear and moist.  Eyes: Conjunctivae and EOM are normal.  Neck: Neck supple.  Cardiovascular: Normal rate, regular rhythm and normal heart sounds. Exam reveals no gallop and no friction rub.  No murmur heard. Pulmonary/Chest: Effort normal and breath sounds normal. She has no wheezes.  She has no rales.  Neurological: She is alert and oriented to person, place, and time.  Skin: Skin is warm. No rash noted.  Psychiatric: She has a normal mood and affect. Her behavior is normal.  Nursing note and vitals reviewed.  Previous notes and tests were reviewed. The plan was reviewed with the patient/family, and all questions/concerned were addressed.  It was my pleasure to see Deanna Simmons today and participate in her care. Please feel free to contact me with any questions or concerns.  Sincerely,  Rexene Alberts, DO Allergy & Immunology  [facility] office: [phone] [facility] office: [phone]

## 2019-03-07 NOTE — Assessment & Plan Note (Signed)
Stopped zantac due to recall and has not started Pepcid. Some worsening heartburn symptoms.  Take pepcid 20mg  daily.

## 2019-03-07 NOTE — Patient Instructions (Addendum)
Asthma: . Daily controller medication(s): Symbicort 160 2 puffs once a day with spacer and rinse mouth afterwards. . Prior to physical activity: May use albuterol rescue inhaler 2 puffs 5 to 15 minutes prior to strenuous physical activities. Marland Kitchen Rescue medications: May use albuterol rescue inhaler 2 puffs or nebulizer every 4 to 6 hours as needed for shortness of breath, chest tightness, coughing, and wheezing. Monitor frequency of use.  . During upper respiratory infections: Start Symbicort 160 2 puffs twice a day for 1-2 weeks.   o May additionally add Qvar 80 2 puffs twice a day for 1-2 weeks. . Asthma control goals:  o Full participation in all desired activities (may need albuterol before activity) o Albuterol use two times or less a week on average (not counting use with activity) o Cough interfering with sleep two times or less a month o Oral steroids no more than once a year o No hospitalizations  Allergic rhinitis:  Continue zyrtec 10mg  daily.  Nasal saline spray (i.e., Simply Saline) or nasal saline lavage (i.e., NeilMed) is recommended as needed and prior to medicated nasal sprays.  May use Flonase 1 spray daily.  LPRD:  Take pepcid 20mg  daily.  Follow up in 4 months

## 2019-03-07 NOTE — Telephone Encounter (Signed)
Please inform patient that if her asthma is flared that would be covered under her FMLA.

## 2019-03-07 NOTE — Assessment & Plan Note (Signed)
Past history - 2012 testing was positive to grass, ragweed, weed, trees, mold.  Interim history - stable with below regimen.  Continue zyrtec 10mg  daily.  Nasal saline spray (i.e., Simply Saline) or nasal saline lavage (i.e., NeilMed) is recommended as needed and prior to medicated nasal sprays.  May use Flonase 1 spray daily.  Continue environmental control measures.

## 2019-03-07 NOTE — Telephone Encounter (Signed)
Called and informed patient what was stated by Dr. Neldon Mc. Informed patient that there is not much more we can do at this point in time. Patient stated that she understands. Patient stated that she has to return back to work tomorrow and would like to be evaluated for her asthma to ensure that she has a clean bill of health. Patient has been placed on the schedule today to be evaluated by [name].

## 2019-03-07 NOTE — Assessment & Plan Note (Signed)
Well-controlled with below regimen.  Today's spirometry showed mild obstruction.  Take Symbicort 160 2 puffs BID for the next 1-2 weeks then follow below schedule:  Marland Kitchen Daily controller medication(s): Symbicort 160 2 puffs once a day with spacer and rinse mouth afterwards. . Prior to physical activity: May use albuterol rescue inhaler 2 puffs 5 to 15 minutes prior to strenuous physical activities. Marland Kitchen Rescue medications: May use albuterol rescue inhaler 2 puffs or nebulizer every 4 to 6 hours as needed for shortness of breath, chest tightness, coughing, and wheezing. Monitor frequency of use.  . During upper respiratory infections: Start Symbicort 160 2 puffs twice a day for 1-2 weeks.   o May additionally add Qvar 80 2 puffs twice a day for 1-2 weeks.

## 2019-03-07 NOTE — Telephone Encounter (Signed)
Please inform patient that our letter clearly states that she needs to be very careful with exposures until June 1.  If her worksite over rules that letter it becomes a legal issue for if she does become sick they will be held legally liable.  She probably needs to speak with the Charity fundraiser at her place of work about what other options are available for her.

## 2019-03-07 NOTE — Telephone Encounter (Signed)
Dr. Neldon Mc wrote this patient a letter to stay out of work until June 1, stating health concerns. She called this morning and her work told her that they are not in crisis anymore and they want her back at work tomorrow, 03-08-2019. She does not feel like she should go back. She would like to know if there is anything Dr. Neldon Mc can do to extend this.

## 2019-03-15 ENCOUNTER — Telehealth: Payer: Self-pay | Admitting: *Deleted

## 2019-03-15 NOTE — Telephone Encounter (Signed)
"  I saw Dr. Cannon Kettle and she recommended I have surgery.  I'm trying to call and schedule that."

## 2019-03-20 NOTE — Telephone Encounter (Signed)
I am returning your call.  You need to see Dr. Cannon Kettle for a consultation to sign your consent forms and to get a surgical kit.  We will schedule your surgery at that appointment.  Would you like for me to transfer you to a scheduler?  "Yes, that would be good."  I transferred her to Ria Comment so she could schedule an appointment.

## 2019-04-01 DIAGNOSIS — E119 Type 2 diabetes mellitus without complications: Secondary | ICD-10-CM | POA: Diagnosis not present

## 2019-04-01 DIAGNOSIS — E669 Obesity, unspecified: Secondary | ICD-10-CM | POA: Diagnosis not present

## 2019-04-01 DIAGNOSIS — M79603 Pain in arm, unspecified: Secondary | ICD-10-CM | POA: Diagnosis not present

## 2019-04-01 DIAGNOSIS — S46819A Strain of other muscles, fascia and tendons at shoulder and upper arm level, unspecified arm, initial encounter: Secondary | ICD-10-CM | POA: Diagnosis not present

## 2019-04-01 DIAGNOSIS — I10 Essential (primary) hypertension: Secondary | ICD-10-CM | POA: Diagnosis not present

## 2019-04-01 MED FILL — traMADol HCL 50 MG TABS: 50 | 5 days supply | Qty: 15 | Fill #0

## 2019-04-02 ENCOUNTER — Other Ambulatory Visit: Payer: Self-pay

## 2019-04-02 ENCOUNTER — Ambulatory Visit: Payer: 59 | Admitting: Sports Medicine

## 2019-04-02 ENCOUNTER — Ambulatory Visit (INDEPENDENT_AMBULATORY_CARE_PROVIDER_SITE_OTHER): Payer: 59

## 2019-04-02 ENCOUNTER — Encounter: Payer: Self-pay | Admitting: Sports Medicine

## 2019-04-02 VITALS — Temp 97.3°F

## 2019-04-02 DIAGNOSIS — M2041 Other hammer toe(s) (acquired), right foot: Secondary | ICD-10-CM | POA: Diagnosis not present

## 2019-04-02 DIAGNOSIS — M21619 Bunion of unspecified foot: Secondary | ICD-10-CM

## 2019-04-02 DIAGNOSIS — E119 Type 2 diabetes mellitus without complications: Secondary | ICD-10-CM

## 2019-04-02 DIAGNOSIS — M25511 Pain in right shoulder: Secondary | ICD-10-CM | POA: Diagnosis not present

## 2019-04-02 DIAGNOSIS — M79674 Pain in right toe(s): Secondary | ICD-10-CM | POA: Diagnosis not present

## 2019-04-02 DIAGNOSIS — L84 Corns and callosities: Secondary | ICD-10-CM

## 2019-04-02 NOTE — Progress Notes (Signed)
Subjective: Deanna Simmons is a 68 y.o. female patient who returns to office for evaluation of Right second toe pain secondary to callus skin. Patient reports that she has thought about the options and she wants to have surgery.  Patient is diabetic and reports her last A1c was around 7 and prior last blood sugar this morning was 115.  Denies any changes with medications or health since last visit.   Patient Active Problem List   Diagnosis Date Noted  . Seasonal and perennial allergic rhinitis 03/07/2019  . LPRD (laryngopharyngeal reflux disease) 03/07/2019  . Asthma, well controlled 12/11/2018  . Menopausal symptom 12/11/2018  . Pain of breast 12/11/2018  . Type 2 diabetes mellitus ([place]) 12/11/2018  . Hammer toe of right foot 06/28/2018  . Corns and callosities 06/28/2018  . Bunion 06/28/2018  . Chronic pain of left knee 09/06/2016  . Right carotid bruit 08/03/2015  . Moderate aortic stenosis 07/18/2014  . CAD (coronary artery disease) of artery bypass graft   . Hypercholesteremia   . Obesity   . Anemia   . Chondromalacia of knee   . Synovitis of knee   . Hypertension   . Fibroids   . Internal hemorrhoids   . Diverticulum   . Vulvitis   . Old MI (myocardial infarction)     Current Outpatient Medications on File Prior to Visit  Medication Sig Dispense Refill  . aspirin 81 MG tablet Take 81 mg by mouth daily.    . beclomethasone (QVAR REDIHALER) 80 MCG/ACT inhaler Take three inhalations three times daily during flare-up as directed.  Rinse, gargle, and spit after use. 1 Inhaler 3  . benzonatate (TESSALON) 100 MG capsule Take 1-2 capsules (100-200 mg total) by mouth 3 (three) times daily as needed for cough. 40 capsule 0  . budesonide-formoterol (SYMBICORT) 160-4.5 MCG/ACT inhaler INHALE 2 PUFFS INTO THE LUNGS 2 TIMES DAILY. 10.2 g 5  . cetirizine (ZYRTEC) 10 MG tablet Take 10 mg by mouth daily.    . Cholecalciferol (VITAMIN D3) 2000 UNITS TABS Take 1 tablet by mouth daily.  With Vitamin K2 in it    . Dextromethorphan-Guaifenesin (MUCINEX DM PO) Take by mouth as needed. Reported on 03/29/2016    . ezetimibe (ZETIA) 10 MG tablet TAKE 1 TABLET BY MOUTH DAILY. 90 tablet 3  . fluticasone (FLONASE) 50 MCG/ACT nasal spray INSTILL 1 TO 2 SPRAYS INTO EACH NOSTRIL DAILY FOR STUFFY NOSE OR DRAINAGE 48 g 5  . Magnesium Oxide 500 MG TABS   3  . metFORMIN (GLUCOPHAGE) 500 MG tablet Take 500 mg by mouth 2 (two) times a day.     . metoprolol tartrate (LOPRESSOR) 25 MG tablet TAKE 1 TABLET BY MOUTH 2 TIMES DAILY. 180 tablet 3  . Multiple Vitamins-Minerals (MULTIVITAL PO) Take 1 tablet by mouth daily.    . nitroGLYCERIN (NITROSTAT) 0.4 MG SL tablet Place 0.4 mg under the tongue every 5 (five) minutes as needed for chest pain (3 doses MAX). Reported on 12/01/2015    . Probiotic Product (PROBIOTIC PO) Take 1 tablet by mouth daily. Pt takes Restora    . rosuvastatin (CRESTOR) 40 MG tablet TAKE 1 TABLET BY MOUTH DAILY. 90 tablet 3  . sodium chloride (OCEAN) 0.65 % SOLN nasal spray Place 1 spray into both nostrils as needed for congestion.    . traMADol (ULTRAM) 50 MG tablet     . TRUE METRIX BLOOD GLUCOSE TEST test strip   3  . TRUEPLUS LANCETS 30G MISC  3  . valACYclovir (VALTREX) 500 MG tablet Take 500 mg by mouth daily as needed (cold sores/blisters). Reported on 12/01/2015    . VENTOLIN HFA 108 (90 Base) MCG/ACT inhaler INHALE 2 PUFFS BY MOUTH EVERY 4-6 HOURS AS NEEDED FOR COUGH OR WHEEZE 18 g 1   No current facility-administered medications on file prior to visit.     Allergies  Allergen Reactions  . Biaxin [Clarithromycin] Itching  . Cardiolite [Technetium-90m] Itching  . Ciprofloxacin Itching  . Erythromycin Base Itching  . Montelukast Sodium Itching  . Niaspan [Niacin Er] Other (See Comments)    flushing  . Penicillins Itching  . Prilosec [Omeprazole] Itching  . Singulair [Montelukast]   . Ticlid [Ticlopidine Hcl] Itching   Past Surgical History:  Procedure  Laterality Date  . CESAREAN SECTION    . CORONARY ARTERY BYPASS GRAFT    . Endometrial curettage  03/29/2000  . ROTATOR CUFF REPAIR    . TUBAL LIGATION    . UPPER GASTROINTESTINAL ENDOSCOPY  12/11/2002   Patient tends to develop keloids  Social History   Socioeconomic History  . Marital status: Married    Spouse name: Alvester Chou  . Number of children: 4  . Years of education: 12+  . Highest education level: Not on file  Occupational History  . Occupation: CARE MANAGEMENT ASST    Employer: [employer]  . Financial resource strain: Not on file  . Food insecurity:    Worry: Not on file    Inability: Not on file  . Transportation needs:    Medical: Not on file    Non-medical: Not on file  Tobacco Use  . Smoking status: Former Research scientist (life sciences)  . Smokeless tobacco: Never Used  Substance and Sexual Activity  . Alcohol use: Yes    Comment: occasional  . Drug use: No  . Sexual activity: Not on file  Lifestyle  . Physical activity:    Days per week: Not on file    Minutes per session: Not on file  . Stress: Not on file  Relationships  . Social connections:    Talks on phone: Not on file    Gets together: Not on file    Attends religious service: Not on file    Active member of club or organization: Not on file    Attends meetings of clubs or organizations: Not on file    Relationship status: Not on file  Other Topics Concern  . Not on file  Social History Narrative   Lives with her husband. Their children are grown, 1 lives in [place], 1 in [place], [place],  1 in [place], [place], and one in [place], [place].    Family History  Problem Relation Age of Onset  . Diabetes Mother   . Stroke Mother   . Breast cancer Mother 73  . Prostate cancer Father   . Diabetes Father   . Hyperlipidemia Father   . Heart attack Paternal Uncle        2 paternal uncles  . Ovarian cancer Cousin     Objective:  General: Alert and oriented x3 in no acute distress  Dermatology:  Keratotic lesion present over second proximal interphalangeal joint with skin lines transversing the lesion, pain is present with direct pressure to the lesion with a central nucleated core noted, no webspace macerations, no ecchymosis bilateral, all nails x 10 are well manicured.  Vascular: Dorsalis Pedis and Posterior Tibial pedal pulses 1/4, Capillary Fill Time 3 seconds, + pedal hair  growth bilateral, no edema bilateral lower extremities, Temperature gradient within normal limits.  Neurology: Johney Maine sensation intact via light touch bilateral.  Musculoskeletal: Mild tenderness with palpation at the keratotic lesion site on Right second hammertoe which is secondary to grade 4 bunion with crossover of hallux and lesser hammertoe secondary to crowding, pes planus foot type.  Muscular strength 5/5 in all groups without pain or limitation on range of motion.  Xray Right foot Grade 4 bunion with hallux pronation, 2nd long toe/hammertoe, midfoot arthritis, no other issues noted.   Assessment and Plan: Problem List Items Addressed This Visit      Musculoskeletal and Integument   Hammer toe of right foot - Primary   Relevant Orders   DG Foot Complete Right (Completed)   Corns and callosities   Bunion    Other Visit Diagnoses    Toe pain, right       Diabetes mellitus without complication ([place])         -Complete examination performed -Xrays reviewed  -Discussed treatment options for corn and hammertoe which is secondary to Bunion with cross-over -Patient opt for surgical management. Consent obtained for R 2nd hammertoe and excision of corn with Kwire. Pre and Post op course explained. Risks, benefits, alternatives explained. No guarantees given or implied. Surgical booking slip submitted and provided patient with Surgical packet and info for [place]. -To dispense Post op shoe/wedge at surgical center and advised patient that she will need to use Cane to help keep her steady when walking with  shoe -Advised time off from work and to expect a 4-6 weeks recovery -Patient to return to office after surgery or sooner if problems arise.  Landis Martins, DPM

## 2019-04-02 NOTE — Patient Instructions (Signed)
Hammer Toe  Hammer toe is a change in the shape (a deformity) of your toe. The deformity causes the middle joint of your toe to stay bent. This causes pain, especially when you are wearing shoes. Hammer toe starts gradually. At first, the toe can be straightened. Gradually over time, the deformity becomes stiff and permanent. Early treatments to keep the toe straight may relieve pain. As the deformity becomes stiff and permanent, surgery may be needed to straighten the toe. What are the causes? Hammer toe is caused by abnormal bending of the toe joint that is closest to your foot. It happens gradually over time. This pulls on the muscles and connections (tendons) of the toe joint, making them weak and stiff. It is often related to wearing shoes that are too short or narrow and do not let your toes straighten. What increases the risk? You may be at greater risk for hammer toe if you:  Are female.  Are older.  Wear shoes that are too small.  Wear high-heeled shoes that pinch your toes.  Are a ballet dancer.  Have a second toe that is longer than your big toe (first toe).  Injure your foot or toe.  Have arthritis.  Have a family history of hammer toe.  Have a nerve or muscle disorder. What are the signs or symptoms? The main symptoms of this condition are pain and deformity of the toe. The pain is worse when wearing shoes, walking, or running. Other symptoms may include:  Corns or calluses over the bent part of the toe or between the toes.  Redness and a burning feeling on the toe.  An open sore that forms on the top of the toe.  Not being able to straighten the toe. How is this diagnosed? This condition is diagnosed based on your symptoms and a physical exam. During the exam, your health care provider will try to straighten your toe to see how stiff the deformity is. You may also have tests, such as:  A blood test to check for rheumatoid arthritis.  An X-ray to show how  severe the deformity is. How is this treated? Treatment for this condition will depend on how stiff the deformity is. Surgery is often needed. However, sometimes a hammer toe can be straightened without surgery. Treatments that do not involve surgery include:  Taping the toe into a straightened position.  Using pads and cushions to protect the toe (orthotics).  Wearing shoes that provide enough room for the toes.  Doing toe-stretching exercises at home.  Taking an NSAID to reduce pain and swelling. If these treatments do not help or the toe cannot be straightened, surgery is the next option. The most common surgeries used to straighten a hammer toe include:  Arthroplasty. In this procedure, part of the joint is removed, and that allows the toe to straighten.  Fusion. In this procedure, cartilage between the two bones of the joint is taken out and the bones are fused together into one longer bone.  Implantation. In this procedure, part of the bone is removed and replaced with an implant to let the toe move again.  Flexor tendon transfer. In this procedure, the tendons that curl the toes down (flexor tendons) are repositioned. Follow these instructions at home:  Take over-the-counter and prescription medicines only as told by your health care provider.  Do toe straightening and stretching exercises as told by your health care provider.  Keep all follow-up visits as told by your health care   provider. This is important. How is this prevented?  Wear shoes that give your toes enough room and do not cause pain.  Do not wear high-heeled shoes. Contact a health care provider if:  Your pain gets worse.  Your toe becomes red or swollen.  You develop an open sore on your toe. This information is not intended to replace advice given to you by your health care provider. Make sure you discuss any questions you have with your health care provider. Document Released: 10/14/2000 Document  Revised: 05/15/2017 Document Reviewed: 02/10/2016 Elsevier Interactive Patient Education  2019 [facility] Instructions  Congratulations, you have decided to take an important step towards improving your quality of life.  You can be assured that the doctors and staff at [facility] will be with you every step of the way.  Here are some important things you should know:  1. Plan to be at the surgery center/hospital at least 1 (one) hour prior to your scheduled time, unless otherwise directed by the surgical center/hospital staff.  You must have a responsible adult accompany you, remain during the surgery and drive you home.  Make sure you have directions to the surgical center/hospital to ensure you arrive on time. 2. If you are having surgery at [facility] or [facility], you will need a copy of your medical history and physical form from your family physician within one month prior to the date of surgery. We will give you a form for your primary physician to complete.  3. We make every effort to accommodate the date you request for surgery.  However, there are times where surgery dates or times have to be moved.  We will contact you as soon as possible if a change in schedule is required.   4. No aspirin/ibuprofen for one week before surgery.  If you are on aspirin, any non-steroidal anti-inflammatory medications (Mobic, Aleve, Ibuprofen) should not be taken seven (7) days prior to your surgery.  You make take Tylenol for pain prior to surgery.  5. Medications - If you are taking daily heart and blood pressure medications, seizure, reflux, allergy, asthma, anxiety, pain or diabetes medications, make sure you notify the surgery center/hospital before the day of surgery so they can tell you which medications you should take or avoid the day of surgery. 6. No food or drink after midnight the night before surgery unless directed otherwise by surgical center/hospital  staff. 7. No alcoholic beverages [id number] prior to surgery.  No smoking 24-hours prior or 24-hours after surgery. 8. Wear loose pants or shorts. They should be loose enough to fit over bandages, boots, and casts. 9. Don't wear slip-on shoes. Sneakers are preferred. 10. Bring your boot with you to the surgery center/hospital.  Also bring crutches or a walker if your physician has prescribed it for you.  If you do not have this equipment, it will be provided for you after surgery. 11. If you have not been contacted by the surgery center/hospital by the day before your surgery, call to confirm the date and time of your surgery. 12. Leave-time from work may vary depending on the type of surgery you have.  Appropriate arrangements should be made prior to surgery with your employer. 13. Prescriptions will be provided immediately following surgery by your doctor.  Fill these as soon as possible after surgery and take the medication as directed. Pain medications will not be refilled on weekends and must be approved by the doctor.  14. Remove nail polish on the operative foot and avoid getting pedicures prior to surgery. 15. Wash the night before surgery.  The night before surgery wash the foot and leg well with water and the antibacterial soap provided. Be sure to pay special attention to beneath the toenails and in between the toes.  Wash for at least three (3) minutes. Rinse thoroughly with water and dry well with a towel.  Perform this wash unless told not to do so by your physician.  Enclosed: 1 Ice pack (please put in freezer the night before surgery)   1 Hibiclens skin cleaner   Pre-op instructions  If you have any questions regarding the instructions, please do not hesitate to call our office.  : [address], [employer], [place] [address] -- [place]: [address] [place], [place] [address] -- [phone]  [place]: [place], [place] [address] -- [phone]   [place]: [address] [place], [place], [place] [address] -- [phone]  Website: [email or web address]

## 2019-04-04 ENCOUNTER — Telehealth: Payer: Self-pay | Admitting: Sports Medicine

## 2019-04-04 NOTE — Telephone Encounter (Signed)
WOULD LIKE TO DISCUSS ALTERNATIVES TO SURGERY

## 2019-04-04 NOTE — Telephone Encounter (Signed)
Virtual visit will suffice since I just saw patient. -Dr. Cannon Kettle

## 2019-04-05 NOTE — Telephone Encounter (Signed)
I informed pt of Dr. Leeanne Rio recommendation and pt states she would like to do the virtual visit. I transferred pt to schedulers.

## 2019-04-09 ENCOUNTER — Ambulatory Visit: Payer: 59 | Admitting: Sports Medicine

## 2019-04-09 ENCOUNTER — Encounter: Payer: Self-pay | Admitting: Sports Medicine

## 2019-04-09 ENCOUNTER — Other Ambulatory Visit: Payer: Self-pay

## 2019-04-09 VITALS — Temp 98.2°F

## 2019-04-09 DIAGNOSIS — E119 Type 2 diabetes mellitus without complications: Secondary | ICD-10-CM

## 2019-04-09 DIAGNOSIS — M79674 Pain in right toe(s): Secondary | ICD-10-CM

## 2019-04-09 DIAGNOSIS — L84 Corns and callosities: Secondary | ICD-10-CM | POA: Diagnosis not present

## 2019-04-09 DIAGNOSIS — M21619 Bunion of unspecified foot: Secondary | ICD-10-CM

## 2019-04-09 DIAGNOSIS — M2041 Other hammer toe(s) (acquired), right foot: Secondary | ICD-10-CM

## 2019-04-09 NOTE — Progress Notes (Signed)
Subjective: Deanna Simmons is a 68 y.o. female patient who returns to office for evaluation of Right second toe pain secondary to callus skin. Patient is diabetic and states that after last visit she wanted to discuss alternatives to surgery and wants corn trimmed. No other issues.   Patient Active Problem List   Diagnosis Date Noted  . Seasonal and perennial allergic rhinitis 03/07/2019  . LPRD (laryngopharyngeal reflux disease) 03/07/2019  . Asthma, well controlled 12/11/2018  . Menopausal symptom 12/11/2018  . Pain of breast 12/11/2018  . Type 2 diabetes mellitus ([place]) 12/11/2018  . Hammer toe of right foot 06/28/2018  . Corns and callosities 06/28/2018  . Bunion 06/28/2018  . Chronic pain of left knee 09/06/2016  . Right carotid bruit 08/03/2015  . Moderate aortic stenosis 07/18/2014  . CAD (coronary artery disease) of artery bypass graft   . Hypercholesteremia   . Obesity   . Anemia   . Chondromalacia of knee   . Synovitis of knee   . Hypertension   . Fibroids   . Internal hemorrhoids   . Diverticulum   . Vulvitis   . Old MI (myocardial infarction)     Current Outpatient Medications on File Prior to Visit  Medication Sig Dispense Refill  . aspirin 81 MG tablet Take 81 mg by mouth daily.    . beclomethasone (QVAR REDIHALER) 80 MCG/ACT inhaler Take three inhalations three times daily during flare-up as directed.  Rinse, gargle, and spit after use. 1 Inhaler 3  . benzonatate (TESSALON) 100 MG capsule Take 1-2 capsules (100-200 mg total) by mouth 3 (three) times daily as needed for cough. 40 capsule 0  . budesonide-formoterol (SYMBICORT) 160-4.5 MCG/ACT inhaler INHALE 2 PUFFS INTO THE LUNGS 2 TIMES DAILY. 10.2 g 5  . cetirizine (ZYRTEC) 10 MG tablet Take 10 mg by mouth daily.    . Cholecalciferol (VITAMIN D3) 2000 UNITS TABS Take 1 tablet by mouth daily. With Vitamin K2 in it    . Dextromethorphan-Guaifenesin (MUCINEX DM PO) Take by mouth as needed. Reported on 03/29/2016     . ezetimibe (ZETIA) 10 MG tablet TAKE 1 TABLET BY MOUTH DAILY. 90 tablet 3  . fluticasone (FLONASE) 50 MCG/ACT nasal spray INSTILL 1 TO 2 SPRAYS INTO EACH NOSTRIL DAILY FOR STUFFY NOSE OR DRAINAGE 48 g 5  . Magnesium Oxide 500 MG TABS   3  . metFORMIN (GLUCOPHAGE) 500 MG tablet Take 500 mg by mouth 2 (two) times a day.     . metoprolol tartrate (LOPRESSOR) 25 MG tablet TAKE 1 TABLET BY MOUTH 2 TIMES DAILY. 180 tablet 3  . Multiple Vitamins-Minerals (MULTIVITAL PO) Take 1 tablet by mouth daily.    . nitroGLYCERIN (NITROSTAT) 0.4 MG SL tablet Place 0.4 mg under the tongue every 5 (five) minutes as needed for chest pain (3 doses MAX). Reported on 12/01/2015    . Probiotic Product (PROBIOTIC PO) Take 1 tablet by mouth daily. Pt takes Restora    . rosuvastatin (CRESTOR) 40 MG tablet TAKE 1 TABLET BY MOUTH DAILY. 90 tablet 3  . sodium chloride (OCEAN) 0.65 % SOLN nasal spray Place 1 spray into both nostrils as needed for congestion.    . traMADol (ULTRAM) 50 MG tablet     . TRUE METRIX BLOOD GLUCOSE TEST test strip   3  . TRUEPLUS LANCETS 30G MISC   3  . valACYclovir (VALTREX) 500 MG tablet Take 500 mg by mouth daily as needed (cold sores/blisters). Reported on 12/01/2015    .  VENTOLIN HFA 108 (90 Base) MCG/ACT inhaler INHALE 2 PUFFS BY MOUTH EVERY 4-6 HOURS AS NEEDED FOR COUGH OR WHEEZE 18 g 1   No current facility-administered medications on file prior to visit.     Allergies  Allergen Reactions  . Biaxin [Clarithromycin] Itching  . Cardiolite [Technetium-70m] Itching  . Ciprofloxacin Itching  . Erythromycin Base Itching  . Montelukast Sodium Itching  . Niaspan [Niacin Er] Other (See Comments)    flushing  . Penicillins Itching  . Prilosec [Omeprazole] Itching  . Singulair [Montelukast]   . Ticlid [Ticlopidine Hcl] Itching    Objective:  General: Alert and oriented x3 in no acute distress  Dermatology: Keratotic lesion present over second proximal interphalangeal joint skin lines  transversing the lesion, pain is present with direct pressure to the lesion with a central nucleated core noted, no webspace macerations, no ecchymosis bilateral, all nails x 10 are well manicured.  Vascular: Dorsalis Pedis and Posterior Tibial pedal pulses 1/4, Capillary Fill Time 3 seconds, + pedal hair growth bilateral, no edema bilateral lower extremities, Temperature gradient within normal limits.  Neurology: Johney Maine sensation intact via light touch bilateral.  Musculoskeletal: Mild tenderness with palpation at the keratotic lesion site on Right second hammertoe which is secondary to grade 4 bunion with crossover of hallux and lesser hammertoe secondary to crowding, pes planus foot type.  Muscular strength 5/5 in all groups without pain or limitation on range of motion.  Assessment and Plan: Problem List Items Addressed This Visit      Musculoskeletal and Integument   Hammer toe of right foot   Corns and callosities - Primary   Bunion    Other Visit Diagnoses    Toe pain, right       Diabetes mellitus without complication ([place])         -Complete examination performed -Discussed treatment options for corn and advised patient that her changes are secondary to her mechanical issues of bunions and hammertoes and if this is not corrected the corn will continue at the second toe like before -Mechanically debrided the corn at right 2nd toe using a chisel blade -Re-dispensed silicone digital pads to use and advised wider shoes that give her proper support and more space to prevent rubbing especially at elevated second toe due to bunion and crowding with crossover -Encouraged daily skin emollients -Patient desires to discuss cost of surgery; vicki to give her a call to assist with making a decision about surgery; Consent was signed last visit  -Patient to return to office PRN or sooner if condition worsens.  Landis Martins, DPM

## 2019-04-23 ENCOUNTER — Ambulatory Visit: Payer: 59 | Admitting: Allergy and Immunology

## 2019-04-23 ENCOUNTER — Other Ambulatory Visit: Payer: Self-pay

## 2019-04-23 ENCOUNTER — Encounter: Payer: Self-pay | Admitting: Allergy and Immunology

## 2019-04-23 VITALS — BP 110/72 | HR 76 | Temp 98.2°F | Resp 18

## 2019-04-23 DIAGNOSIS — J4540 Moderate persistent asthma, uncomplicated: Secondary | ICD-10-CM | POA: Diagnosis not present

## 2019-04-23 DIAGNOSIS — K219 Gastro-esophageal reflux disease without esophagitis: Secondary | ICD-10-CM

## 2019-04-23 DIAGNOSIS — J302 Other seasonal allergic rhinitis: Secondary | ICD-10-CM | POA: Diagnosis not present

## 2019-04-23 DIAGNOSIS — J3089 Other allergic rhinitis: Secondary | ICD-10-CM

## 2019-04-23 MED ORDER — FAMOTIDINE 40 MG PO TABS: 40.0000 mg | ORAL_TABLET | Freq: Every day | ORAL | 5 refills | Status: DC

## 2019-04-23 NOTE — Progress Notes (Signed)
Deanna Simmons   Follow-up Note  Referring Provider: Josetta Huddle, MD Primary Provider: Josetta Huddle, MD Date of Office Visit: 04/23/2019  Subjective:   Deanna Simmons (DOB: May 28, 1951) is a 68 y.o. female who returns to the [facility] on 04/23/2019 in re-evaluation of the following:  HPI: Deanna Simmons presents to this clinic in evaluation of asthma and allergic rhinitis and LPR.  Her last visit to this clinic with me was 24 July 2018 and she did visit with [name] in this clinic on 07 Mar 2019 at which point in time she was doing relatively well regarding her asthma and allergic rhinitis although her reflux was active as she was not using an H2 receptor blocker.  At this point in time Deanna Simmons is really doing very well and she rarely uses a short acting bronchodilator while she continues to utilize Symbicort mostly twice a day on a regular basis.  As well, her nose does well when using Flonase but she does develop problems with epistaxis if she uses it for prolonged period in time.  She has restarted famotidine to treat her reflux which is under pretty good control at this point.  Allergies as of 04/23/2019      Reactions   Biaxin [clarithromycin] Itching   Cardiolite [technetium-66m] Itching   Ciprofloxacin Itching   Erythromycin Base Itching   Montelukast Sodium Itching   Niaspan [niacin Er] Other (See Comments)   flushing   Penicillins Itching   Prilosec [omeprazole] Itching   Singulair [montelukast]    Ticlid [ticlopidine Hcl] Itching      Medication List      aspirin 81 MG tablet Take 81 mg by mouth daily.   budesonide-formoterol 160-4.5 MCG/ACT inhaler Commonly known as: Symbicort INHALE 2 PUFFS INTO THE LUNGS 2 TIMES DAILY.   cetirizine 10 MG tablet Commonly known as: ZYRTEC Take 10 mg by mouth daily.   ezetimibe 10 MG tablet Commonly known as: ZETIA TAKE 1 TABLET BY MOUTH DAILY.   famotidine 20 MG  tablet Commonly known as: PEPCID Take 20 mg by mouth daily.   fluticasone 50 MCG/ACT nasal spray Commonly known as: FLONASE INSTILL 1 TO 2 SPRAYS INTO EACH NOSTRIL DAILY FOR STUFFY NOSE OR DRAINAGE   metFORMIN 500 MG tablet Commonly known as: GLUCOPHAGE Take 500 mg by mouth 2 (two) times a day.   metoprolol tartrate 25 MG tablet Commonly known as: LOPRESSOR TAKE 1 TABLET BY MOUTH 2 TIMES DAILY.   Deanna Simmons DM PO Take by mouth as needed. Reported on 03/29/2016   MULTIVITAL PO Take 1 tablet by mouth daily.   nitroGLYCERIN 0.4 MG SL tablet Commonly known as: NITROSTAT Place 0.4 mg under the tongue every 5 (five) minutes as needed for chest pain (3 doses MAX). Reported on 12/01/2015   PROBIOTIC PO Take 1 tablet by mouth daily. Pt takes Restora   rosuvastatin 40 MG tablet Commonly known as: CRESTOR TAKE 1 TABLET BY MOUTH DAILY.   sodium chloride 0.65 % Soln nasal spray Commonly known as: OCEAN Place 1 spray into both nostrils as needed for congestion.   True Metrix Blood Glucose Test test strip Generic drug: glucose blood   TRUEplus Lancets 30G Misc   valACYclovir 500 MG tablet Commonly known as: VALTREX Take 500 mg by mouth daily as needed (cold sores/blisters). Reported on 12/01/2015   Ventolin HFA 108 (90 Base) MCG/ACT inhaler Generic drug: albuterol INHALE 2 PUFFS BY MOUTH EVERY 4-6 HOURS  AS NEEDED FOR COUGH OR WHEEZE   Vitamin D3 50 MCG (2000 UT) Tabs Take 1 tablet by mouth daily. With Vitamin K2 in it       Past Medical History:  Diagnosis Date  . Abnormal finding on Pap smear, ASCUS   . Anemia   . Asthma   . Chondromalacia of knee   . Coronary artery disease   . Diabetes mellitus without complication (Deanna Simmons)   . Diverticulum 12/11/2002   in cecum  . Fibroids    h/o  . Glucose intolerance (impaired glucose tolerance)   . H/O elevated lipids   . H/O tinea cruris 02/2010  . History of ASCVD    s/p CABG  . Hypercholesteremia   . Hypertension   .  Internal hemorrhoids    h/o  . Irregular menses    h/o  . MI (myocardial infarction) (Deanna Simmons) age 61  . Obesity   . Pneumonia 07/2008  . Synovitis of knee   . Systemic lupus erythematosus (Deanna Simmons)    with nephritis  . Vulvitis    h/o    Past Surgical History:  Procedure Laterality Date  . CESAREAN SECTION    . CORONARY ARTERY BYPASS GRAFT    . Endometrial curettage  03/29/2000  . ROTATOR CUFF REPAIR    . TUBAL LIGATION    . UPPER GASTROINTESTINAL ENDOSCOPY  12/11/2002    Review of systems negative except as noted in HPI / PMHx or noted below:  Review of Systems  Constitutional: Negative.   HENT: Negative.   Eyes: Negative.   Respiratory: Negative.   Cardiovascular: Negative.   Gastrointestinal: Negative.   Genitourinary: Negative.   Musculoskeletal: Negative.   Skin: Negative.   Neurological: Negative.   Endo/Heme/Allergies: Negative.   Psychiatric/Behavioral: Negative.      Objective:   Vitals:   04/23/19 1701  BP: 110/72  Pulse: 76  Resp: 18  Temp: 98.2 F (36.8 C)  SpO2: 97%          Physical Exam Constitutional:      Appearance: She is not diaphoretic.  HENT:     Head: Normocephalic.     Right Ear: Tympanic membrane, ear canal and external ear normal.     Left Ear: Tympanic membrane, ear canal and external ear normal.     Nose: Nose normal. No mucosal edema or rhinorrhea.     Mouth/Throat:     Pharynx: Uvula midline. No oropharyngeal exudate.  Eyes:     Conjunctiva/sclera: Conjunctivae normal.  Neck:     Thyroid: No thyromegaly.     Trachea: Trachea normal. No tracheal tenderness or tracheal deviation.  Cardiovascular:     Rate and Rhythm: Normal rate and regular rhythm.     Heart sounds: S1 normal and S2 normal. Murmur (Systolic) present.  Pulmonary:     Effort: No respiratory distress.     Breath sounds: Normal breath sounds. No stridor. No wheezing or rales.  Lymphadenopathy:     Head:     Right side of head: No tonsillar adenopathy.      Left side of head: No tonsillar adenopathy.     Cervical: No cervical adenopathy.  Skin:    Findings: No erythema or rash.     Nails: There is no clubbing.   Neurological:     Mental Status: She is alert.     Diagnostics:    Spirometry was performed and demonstrated an FEV1 of 1.95 at 98 % of predicted.  The patient had an Asthma  Control Test with the following results: ACT Total Score: 23.    Assessment and Plan:   1. Asthma, moderate persistent, well-controlled   2. Seasonal and perennial allergic rhinitis   3. LPRD (laryngopharyngeal reflux disease)     1.  Continue Symbicort 160 two inhalations 2 inhalations 1-2 times per day depending on disease activity  2.  Continue Flonase 1 spray each nostril 3-7 times per week depending on disease activity  3. Continue Famotidine 40 mg tablet 3-7 times per week  4.  "Action plan" for asthma flare:   A. Qvar 80 REDIHALER 3 inhalations 3 times per day  B. use Proventil HFA 2 puffs every 4-6 hours if needed  5. Zyrtec 10 mg one tablet one time per day if needed  6. Return to clinic in 6 months or earlier if problem  7. Obtain fall flu vaccine  Overall Evelisse appears to be doing relatively well on her current plan.  She has a very good understanding of her disease state and how her medications work and appropriate dosing of her medications and she will find a dose of Symbicort and Flonase and famotidine that works best for her as she moves forward over these course of the next 6 months.  Should she have any difficulty during this interval she will contact me for further evaluation and treatment.  Otherwise, I will regroup with her in 6 months.  Allena Katz, MD Allergy / Immunology [facility]

## 2019-04-23 NOTE — Patient Instructions (Addendum)
  1.  Continue Symbicort 160 two inhalations 2 inhalations 1-2 times per day depending on disease activity  2.  Continue Flonase 1 spray each nostril 3-7 times per week depending on disease activity  3. Continue Famotidine 40 mg tablet 3-7 times per week  4.  "Action plan" for asthma flare:   A. Qvar 80 REDIHALER 3 inhalations 3 times per day  B. use Proventil HFA 2 puffs every 4-6 hours if needed  5. Zyrtec 10 mg one tablet one time per day if needed  6. Return to clinic in 6 months or earlier if problem  7. Obtain fall flu vaccine

## 2019-04-24 ENCOUNTER — Encounter: Payer: Self-pay | Admitting: Allergy and Immunology

## 2019-04-29 ENCOUNTER — Other Ambulatory Visit: Payer: Self-pay | Admitting: *Deleted

## 2019-04-29 MED ORDER — FAMOTIDINE 20 MG PO TABS: ORAL_TABLET | ORAL | 5 refills | Status: DC

## 2019-04-29 MED FILL — ROSUVASTATIN CALCIUM 40 MG: 40 | 90 days supply | Qty: 90 | Fill #1

## 2019-04-29 MED FILL — SM ACID REDUCER 20 MG TAB: 20 | 25 days supply | Qty: 50 | Fill #0

## 2019-05-13 MED FILL — SM ACID REDUCER 20 MG TAB: 20 | 25 days supply | Qty: 50 | Fill #0

## 2019-06-06 ENCOUNTER — Telehealth: Payer: Self-pay | Admitting: Allergy and Immunology

## 2019-06-06 NOTE — Telephone Encounter (Signed)
Pt called to see if we had any samples of symibcort 160 that she could oick up . [phone].

## 2019-06-06 NOTE — Telephone Encounter (Signed)
Called patient advised samples placed up front for her to pick up patient verbalized understanding and will pick up today

## 2019-06-07 ENCOUNTER — Telehealth: Payer: Self-pay

## 2019-06-07 DIAGNOSIS — J4531 Mild persistent asthma with (acute) exacerbation: Secondary | ICD-10-CM | POA: Diagnosis not present

## 2019-06-07 MED FILL — HYDROCODONE-CHLORPHEN ER SU: 10-8 | 7 days supply | Qty: 70 | Fill #0

## 2019-06-07 NOTE — Telephone Encounter (Signed)
How long have symptoms been going on? Has she tried Mucinex and is she using her fluticasone up to two sprays per nostril twice daily?   Salvatore Marvel, MD [facility]

## 2019-06-07 NOTE — Telephone Encounter (Signed)
Dr. Ernst Bowler any recommendations.

## 2019-06-07 NOTE — Telephone Encounter (Signed)
Patient came in yesterday to pick up Symbicort samples. Deanna Simmons also printed her out instructions on how to take her medications especially during flares.  Patient states she is having a Runny Nose, Congestion & Coughing.   Please Advise.   [place].

## 2019-06-07 NOTE — Telephone Encounter (Signed)
I left a message for patient to call back in regards to the follow up questions. I did let her know that I am currently in the [facility] office.

## 2019-06-11 ENCOUNTER — Ambulatory Visit: Payer: 59 | Admitting: Allergy and Immunology

## 2019-06-11 ENCOUNTER — Other Ambulatory Visit: Payer: Self-pay

## 2019-06-11 ENCOUNTER — Encounter: Payer: Self-pay | Admitting: Allergy and Immunology

## 2019-06-11 VITALS — BP 128/90 | HR 92 | Temp 97.2°F | Resp 18

## 2019-06-11 DIAGNOSIS — J302 Other seasonal allergic rhinitis: Secondary | ICD-10-CM

## 2019-06-11 DIAGNOSIS — J3089 Other allergic rhinitis: Secondary | ICD-10-CM

## 2019-06-11 DIAGNOSIS — J4541 Moderate persistent asthma with (acute) exacerbation: Secondary | ICD-10-CM

## 2019-06-11 DIAGNOSIS — K219 Gastro-esophageal reflux disease without esophagitis: Secondary | ICD-10-CM | POA: Diagnosis not present

## 2019-06-11 NOTE — Progress Notes (Signed)
[facility]   Follow-up Note  Referring Provider: Josetta Huddle, MD Primary Provider: Josetta Huddle, MD Date of Office Visit: 06/11/2019  Subjective:   Deanna Simmons (DOB: 1951/07/04) is a 68 y.o. female who returns to the [facility] [place] on 06/11/2019 in re-evaluation of the following:  HPI: Ananda returns to this clinic in evaluation of asthma and allergic rhinitis and LPR.  Her last visit to this clinic was 23 April 2019 at which point time she was doing very well.  Unfortunately, a little less than 1 week ago she developed sneezing and itchy throat and then coughing within 24 hours of her upper airway onset.  She was apparently tested for COVID and was swab negative.  She initiated her "action plan" and was given Tussionex for cough.  Although her nose has improved somewhat in that she is not sneezing and she no longer has any throat problem she has lots of mucus coming from both her nose and her lungs and she still continues to cough.  She has been using her short acting bronchodilator.  She has also introduced Mucinex twice a day.  She does not think that her reflux is flared during this episode.  Allergies as of 06/11/2019      Reactions   Biaxin [clarithromycin] Itching   Cardiolite [technetium-40m] Itching   Ciprofloxacin Itching   Erythromycin Base Itching   Montelukast Sodium Itching   Niaspan [niacin Er] Other (See Comments)   flushing   Penicillins Itching   Prilosec [omeprazole] Itching   Singulair [montelukast]    Ticlid [ticlopidine Hcl] Itching      Medication List      aspirin 81 MG tablet Take 81 mg by mouth daily.   budesonide-formoterol 160-4.5 MCG/ACT inhaler Commonly known as: Symbicort INHALE 2 PUFFS INTO THE LUNGS 2 TIMES DAILY.   cetirizine 10 MG tablet Commonly known as: ZYRTEC Take 10 mg by mouth daily.   ezetimibe 10 MG tablet Commonly known as: ZETIA TAKE 1 TABLET BY MOUTH DAILY.   famotidine 20 MG tablet Commonly known as: PEPCID Take 2 tablets once daily   fluticasone 50 MCG/ACT nasal spray Commonly known as: FLONASE INSTILL 1 TO 2 SPRAYS INTO EACH NOSTRIL DAILY FOR STUFFY NOSE OR DRAINAGE   metFORMIN 500 MG tablet Commonly known as: GLUCOPHAGE Take 500 mg by mouth 2 (two) times a day.   metoprolol tartrate 25 MG tablet Commonly known as: LOPRESSOR TAKE 1 TABLET BY MOUTH 2 TIMES DAILY.   [place] DM PO Take by mouth as needed. Reported on 03/29/2016   MULTIVITAL PO Take 1 tablet by mouth daily.   nitroGLYCERIN 0.4 MG SL tablet Commonly known as: NITROSTAT Place 0.4 mg under the tongue every 5 (five) minutes as needed for chest pain (3 doses MAX). Reported on 12/01/2015   PROBIOTIC PO Take 1 tablet by mouth daily. Pt takes Restora   rosuvastatin 40 MG tablet Commonly known as: CRESTOR TAKE 1 TABLET BY MOUTH DAILY.   sodium chloride 0.65 % Soln nasal spray Commonly known as: OCEAN Place 1 spray into both nostrils as needed for congestion.   True Metrix Blood Glucose Test test strip Generic drug: glucose blood   TRUEplus Lancets 30G Misc   valACYclovir 500 MG tablet Commonly known as: VALTREX Take 500 mg by mouth daily as needed (cold sores/blisters). Reported on 12/01/2015   Ventolin HFA 108 (90 Base) MCG/ACT inhaler Generic drug: albuterol INHALE 2 PUFFS BY MOUTH  EVERY 4-6 HOURS AS NEEDED FOR COUGH OR WHEEZE   Vitamin D3 50 MCG (2000 UT) Tabs Take 1 tablet by mouth daily. With Vitamin K2 in it       Past Medical History:  Diagnosis Date   Abnormal finding on Pap smear, ASCUS    Anemia    Asthma    Chondromalacia of knee    Coronary artery disease    Diabetes mellitus without complication ([place])    Diverticulum 12/11/2002   in cecum   Fibroids    h/o   Glucose intolerance (impaired glucose tolerance)    H/O elevated lipids    H/O tinea cruris 02/2010   History of ASCVD    s/p CABG   Hypercholesteremia     Hypertension    Internal hemorrhoids    h/o   Irregular menses    h/o   MI (myocardial infarction) ([place]) age 50   Obesity    Pneumonia 07/2008   Synovitis of knee    Systemic lupus erythematosus ([place])    with nephritis   Vulvitis    h/o    Past Surgical History:  Procedure Laterality Date   CESAREAN SECTION     CORONARY ARTERY BYPASS GRAFT     Endometrial curettage  03/29/2000   ROTATOR CUFF REPAIR     TUBAL LIGATION     UPPER GASTROINTESTINAL ENDOSCOPY  12/11/2002    Review of systems negative except as noted in HPI / PMHx or noted below:  Review of Systems  Constitutional: Negative.   HENT: Negative.   Eyes: Negative.   Respiratory: Negative.   Cardiovascular: Negative.   Gastrointestinal: Negative.   Genitourinary: Negative.   Musculoskeletal: Negative.   Skin: Negative.   Neurological: Negative.   Endo/Heme/Allergies: Negative.   Psychiatric/Behavioral: Negative.      Objective:   Vitals:   06/11/19 1420  BP: 128/90  Pulse: 92  Resp: 18  Temp: (!) 97.2 F (36.2 C)  SpO2: 97%          Physical Exam Constitutional:      Appearance: She is not diaphoretic.  HENT:     Head: Normocephalic.     Right Ear: Tympanic membrane, ear canal and external ear normal.     Left Ear: Tympanic membrane, ear canal and external ear normal.     Nose: Nose normal. No mucosal edema or rhinorrhea.     Mouth/Throat:     Pharynx: Uvula midline. No oropharyngeal exudate.  Eyes:     Conjunctiva/sclera: Conjunctivae normal.  Neck:     Thyroid: No thyromegaly.     Trachea: Trachea normal. No tracheal tenderness or tracheal deviation.  Cardiovascular:     Rate and Rhythm: Normal rate and regular rhythm.     Heart sounds: Normal heart sounds, S1 normal and S2 normal. No murmur.  Pulmonary:     Effort: No respiratory distress.     Breath sounds: Normal breath sounds. No stridor. No wheezing or rales.  Lymphadenopathy:     Head:     Right side of head: No  tonsillar adenopathy.     Left side of head: No tonsillar adenopathy.     Cervical: No cervical adenopathy.  Skin:    Findings: No erythema or rash.     Nails: There is no clubbing.   Neurological:     Mental Status: She is alert.     Diagnostics:    Spirometry was performed and demonstrated an FEV1 of 1.92 at 97  % of  predicted.  The patient had an Asthma Control Test with the following results:  .    Assessment and Plan:   1. Asthma, not well controlled, moderate persistent, with acute exacerbation   2. Seasonal and perennial allergic rhinitis   3. LPRD (laryngopharyngeal reflux disease)   4. Other allergic rhinitis     1.  Continue Symbicort 160 two inhalations 2 inhalations 1-2 times per day depending on disease activity  2.  Continue Flonase 1 spray each nostril 3-7 times per week depending on disease activity  3. Continue Famotidine 40 mg tablet 3-7 times per week  4.  "Action plan" for asthma flare:   A. Add Qvar 80 REDIHALER 3 inhalations 3 times per day  B. use Proventil HFA 2 puffs every 4-6 hours if needed  C. Increase famotidine to 40 mg 2 times per day  5. Zyrtec 10 mg one tablet one time per day if needed  6. Prednisone 10 mg - 1 tablet now and every day for 5 days only  7. Return to clinic in 6 months or earlier if problem  8. Obtain fall flu vaccine (and COVID vaccine)  Fronie appears to have developed a viral induced flare of her respiratory track inflammatory condition for which she will utilize the "action plan" and take a very short course of low-dose systemic steroids assuming that she will improve over the course of the next few days.  Certainly, if she does not resolve this issue with the therapy noted above then she will require further evaluation and treatment.  If she does well I will see her back in this clinic in 6 months or earlier if there is a problem.  Allena Katz, MD Allergy / Immunology [place]

## 2019-06-11 NOTE — Patient Instructions (Addendum)
  1.  Continue Symbicort 160 two inhalations 2 inhalations 1-2 times per day depending on disease activity  2.  Continue Flonase 1 spray each nostril 3-7 times per week depending on disease activity  3. Continue Famotidine 40 mg tablet 3-7 times per week  4.  "Action plan" for asthma flare:   A. Add Qvar 80 REDIHALER 3 inhalations 3 times per day  B. use Proventil HFA 2 puffs every 4-6 hours if needed  C. Increase famotidine to 40 mg 2 times per day  5. Zyrtec 10 mg one tablet one time per day if needed  6. Prednisone 10 mg - 1 tablet now and every day for 5 days only  7. Return to clinic in 6 months or earlier if problem  8. Obtain fall flu vaccine (and COVID vaccine)

## 2019-06-12 ENCOUNTER — Encounter: Payer: Self-pay | Admitting: Allergy and Immunology

## 2019-06-12 ENCOUNTER — Telehealth: Payer: Self-pay | Admitting: *Deleted

## 2019-06-12 NOTE — Telephone Encounter (Signed)
Patient seen in office 06-11-2019

## 2019-06-12 NOTE — Telephone Encounter (Signed)
Forms have been faxed back to our office needing all questions answered for [facility] paperwork. Forms have been faxed to [facility] for Dr. Neldon Mc to complete and have faxed to [employer]. Forms have been placed in pending tray in the Nurse's Stations.

## 2019-06-12 NOTE — Telephone Encounter (Signed)
FMLA papers filled out and refaxed to [employer] as requested.

## 2019-06-13 ENCOUNTER — Telehealth: Payer: Self-pay

## 2019-06-13 ENCOUNTER — Other Ambulatory Visit: Payer: Self-pay

## 2019-06-13 MED ORDER — ALBUTEROL SULFATE HFA 108 (90 BASE) MCG/ACT IN AERS: INHALATION_SPRAY | RESPIRATORY_TRACT | 1 refills | Status: DC

## 2019-06-13 MED FILL — FAMOTIDINE 40 MG TABLET: 40 | 30 days supply | Qty: 30 | Fill #0

## 2019-06-13 MED FILL — ALBUTEROL SULFATE HFA 108 (: 108 (90 BAS | 17 days supply | Qty: 18 | Fill #0

## 2019-06-13 MED FILL — EZETIMIBE 10 MG TABS: 10 | 90 days supply | Qty: 90 | Fill #2

## 2019-06-13 MED FILL — METOPROLOL TARTRATE 25 MG T: 25 | 90 days supply | Qty: 180 | Fill #2

## 2019-06-13 NOTE — Telephone Encounter (Signed)
Pt calling in asking how long she should take her albuterol and Qvar inhaler. Per Dr Bruna Potter last note:    4.  "Action plan" for asthma flare:              A. Add Qvar 80 REDIHALER 3 inhalations 3 times per day             B. use Proventil HFA 2 puffs every 4-6 hours if needed  Explained to pt that she needed to take it as long as she is having symptoms.  Pt states that she needs a refill on her albuterol, hers is currently expired.  Refill sent in for albuterol  Inhaler.  Pt verbalized understanding.  Call ended.

## 2019-06-21 DIAGNOSIS — J45909 Unspecified asthma, uncomplicated: Secondary | ICD-10-CM | POA: Diagnosis not present

## 2019-06-21 MED FILL — predniSONE 5 MG TABS: 5 | 10 days supply | Qty: 30 | Fill #0

## 2019-07-02 DIAGNOSIS — Z20828 Contact with and (suspected) exposure to other viral communicable diseases: Secondary | ICD-10-CM | POA: Diagnosis not present

## 2019-07-16 MED FILL — metFORMIN HCL ER 500 MG TB2: 500 | 90 days supply | Qty: 180 | Fill #0

## 2019-07-16 MED FILL — FAMOTIDINE 40 MG TABLET: 40 | 30 days supply | Qty: 30 | Fill #1

## 2019-07-16 MED FILL — ROSUVASTATIN CALCIUM 40 MG: 40 | 90 days supply | Qty: 90 | Fill #2

## 2019-07-22 DIAGNOSIS — S93492D Sprain of other ligament of left ankle, subsequent encounter: Secondary | ICD-10-CM | POA: Diagnosis not present

## 2019-07-22 DIAGNOSIS — S93491A Sprain of other ligament of right ankle, initial encounter: Secondary | ICD-10-CM | POA: Diagnosis not present

## 2019-07-26 ENCOUNTER — Other Ambulatory Visit: Payer: Self-pay | Admitting: Obstetrics and Gynecology

## 2019-07-26 DIAGNOSIS — Z1231 Encounter for screening mammogram for malignant neoplasm of breast: Secondary | ICD-10-CM

## 2019-08-19 MED FILL — FAMOTIDINE 40 MG TABLET: 40 | 30 days supply | Qty: 30 | Fill #2

## 2019-08-23 DIAGNOSIS — Z20828 Contact with and (suspected) exposure to other viral communicable diseases: Secondary | ICD-10-CM | POA: Diagnosis not present

## 2019-08-29 DIAGNOSIS — H52203 Unspecified astigmatism, bilateral: Secondary | ICD-10-CM | POA: Diagnosis not present

## 2019-08-29 DIAGNOSIS — H5203 Hypermetropia, bilateral: Secondary | ICD-10-CM | POA: Diagnosis not present

## 2019-09-10 ENCOUNTER — Other Ambulatory Visit: Payer: Self-pay

## 2019-09-10 ENCOUNTER — Ambulatory Visit
Admission: RE | Admit: 2019-09-10 | Discharge: 2019-09-10 | Disposition: A | Discharge status: Home / Self Care | Payer: 59 | Source: Ambulatory Visit | Attending: Obstetrics and Gynecology | Admitting: Obstetrics and Gynecology

## 2019-09-10 DIAGNOSIS — Z1231 Encounter for screening mammogram for malignant neoplasm of breast: Secondary | ICD-10-CM | POA: Diagnosis not present

## 2019-09-16 DIAGNOSIS — Z6841 Body Mass Index (BMI) 40.0 and over, adult: Secondary | ICD-10-CM | POA: Diagnosis not present

## 2019-09-16 DIAGNOSIS — Z01411 Encounter for gynecological examination (general) (routine) with abnormal findings: Secondary | ICD-10-CM | POA: Diagnosis not present

## 2019-09-16 DIAGNOSIS — N952 Postmenopausal atrophic vaginitis: Secondary | ICD-10-CM | POA: Diagnosis not present

## 2019-09-16 DIAGNOSIS — N898 Other specified noninflammatory disorders of vagina: Secondary | ICD-10-CM | POA: Diagnosis not present

## 2019-09-16 DIAGNOSIS — R350 Frequency of micturition: Secondary | ICD-10-CM | POA: Diagnosis not present

## 2019-09-16 DIAGNOSIS — R32 Unspecified urinary incontinence: Secondary | ICD-10-CM | POA: Diagnosis not present

## 2019-09-16 MED FILL — ESTRADIOL 10 MCG TABS: 10 | 28 days supply | Qty: 8 | Fill #0

## 2019-09-20 DIAGNOSIS — D509 Iron deficiency anemia, unspecified: Secondary | ICD-10-CM | POA: Diagnosis not present

## 2019-09-20 DIAGNOSIS — E559 Vitamin D deficiency, unspecified: Secondary | ICD-10-CM | POA: Diagnosis not present

## 2019-09-20 DIAGNOSIS — I10 Essential (primary) hypertension: Secondary | ICD-10-CM | POA: Diagnosis not present

## 2019-09-20 DIAGNOSIS — E119 Type 2 diabetes mellitus without complications: Secondary | ICD-10-CM | POA: Diagnosis not present

## 2019-09-20 DIAGNOSIS — E785 Hyperlipidemia, unspecified: Secondary | ICD-10-CM | POA: Diagnosis not present

## 2019-09-23 ENCOUNTER — Other Ambulatory Visit: Payer: Self-pay | Admitting: Interventional Cardiology

## 2019-09-23 ENCOUNTER — Other Ambulatory Visit: Payer: Self-pay | Admitting: Allergy and Immunology

## 2019-09-23 MED FILL — ACCU-CHEK FASTCLIX LANCETS: 90 days supply | Qty: 204 | Fill #2

## 2019-09-23 MED FILL — ACCU-CHEK GUIDE TEST STRIP: 90 days supply | Qty: 200 | Fill #2

## 2019-09-23 MED FILL — EZETIMIBE 10 MG TABS: 10 | 90 days supply | Qty: 90 | Fill #3

## 2019-09-23 MED FILL — FAMOTIDINE 40 MG TABLET: 40 | 30 days supply | Qty: 30 | Fill #3

## 2019-09-24 DIAGNOSIS — I350 Nonrheumatic aortic (valve) stenosis: Secondary | ICD-10-CM | POA: Diagnosis not present

## 2019-09-24 DIAGNOSIS — E119 Type 2 diabetes mellitus without complications: Secondary | ICD-10-CM | POA: Diagnosis not present

## 2019-09-24 DIAGNOSIS — Z79899 Other long term (current) drug therapy: Secondary | ICD-10-CM | POA: Diagnosis not present

## 2019-09-24 DIAGNOSIS — I10 Essential (primary) hypertension: Secondary | ICD-10-CM | POA: Diagnosis not present

## 2019-09-24 DIAGNOSIS — E669 Obesity, unspecified: Secondary | ICD-10-CM | POA: Diagnosis not present

## 2019-09-24 DIAGNOSIS — E559 Vitamin D deficiency, unspecified: Secondary | ICD-10-CM | POA: Diagnosis not present

## 2019-09-24 DIAGNOSIS — Z0000 Encounter for general adult medical examination without abnormal findings: Secondary | ICD-10-CM | POA: Diagnosis not present

## 2019-09-24 DIAGNOSIS — I2510 Atherosclerotic heart disease of native coronary artery without angina pectoris: Secondary | ICD-10-CM | POA: Diagnosis not present

## 2019-09-24 DIAGNOSIS — D509 Iron deficiency anemia, unspecified: Secondary | ICD-10-CM | POA: Diagnosis not present

## 2019-09-24 DIAGNOSIS — E785 Hyperlipidemia, unspecified: Secondary | ICD-10-CM | POA: Diagnosis not present

## 2019-09-24 MED FILL — METOPROLOL TARTRATE 25 MG T: 25 | 90 days supply | Qty: 180 | Fill #0

## 2019-10-03 NOTE — Progress Notes (Signed)
Cardiology Office Note:    Date:  10/04/2019   ID:  DARCELLE SALADIN, DOB 1951-03-27, MRN [id number]  PCP:  Josetta Huddle, MD  Cardiologist:  Sinclair Grooms, MD   Referring MD: Josetta Huddle, MD   Chief Complaint  Patient presents with  . Coronary Artery Disease  . Cardiac Valve Problem    History of Present Illness:    Deanna Simmons is a 68 y.o. female with a hx of CAD, prior coronary bypass grafting 2004 with SVG to the PDA, essential hypertension, asthma, diabetes mellitus, recent cryptogenic stroke manifesting as right retinal embolus, moderate aortic stenosis, morbid obesity, and suspicion for sleep apnea.  Having limitations related to left knee osteoarthritis.  Contemplating surgery.  She denies orthopnea, dyspnea on exertion, syncope, lower extremity swelling, and PND.  No significant palpitations have occurred.  No neurological events have occurred.  Past Medical History:  Diagnosis Date  . Abnormal finding on Pap smear, ASCUS   . Anemia   . Asthma   . Chondromalacia of knee   . Coronary artery disease   . Diabetes mellitus without complication ([place])   . Diverticulum 12/11/2002   in cecum  . Fibroids    h/o  . Glucose intolerance (impaired glucose tolerance)   . H/O elevated lipids   . H/O tinea cruris 02/2010  . History of ASCVD    s/p CABG  . Hypercholesteremia   . Hypertension   . Internal hemorrhoids    h/o  . Irregular menses    h/o  . MI (myocardial infarction) ([place]) age 58  . Obesity   . Pneumonia 07/2008  . Synovitis of knee   . Systemic lupus erythematosus ([place])    with nephritis  . Vulvitis    h/o    Past Surgical History:  Procedure Laterality Date  . CESAREAN SECTION    . CORONARY ARTERY BYPASS GRAFT    . Endometrial curettage  03/29/2000  . ROTATOR CUFF REPAIR    . TUBAL LIGATION    . UPPER GASTROINTESTINAL ENDOSCOPY  12/11/2002    Current Medications: Current Meds  Medication Sig  . albuterol (VENTOLIN HFA) 108 (90 Base)  MCG/ACT inhaler INHALE 2 PUFFS BY MOUTH EVERY 4-6 HOURS AS NEEDED FOR COUGH OR WHEEZE  . aspirin 81 MG tablet Take 81 mg by mouth daily.  . budesonide-formoterol (SYMBICORT) 160-4.5 MCG/ACT inhaler INHALE 2 PUFFS INTO THE LUNGS 2 TIMES DAILY.  . cetirizine (ZYRTEC) 10 MG tablet Take 10 mg by mouth daily.  . Cholecalciferol (VITAMIN D3) 2000 UNITS TABS Take 1 tablet by mouth daily. With Vitamin K2 in it  . Cyanocobalamin (VITAMIN B-12 PO) Take 1 tablet by mouth daily.  Marland Kitchen Dextromethorphan-Guaifenesin (MUCINEX DM PO) Take by mouth as needed. Reported on 03/29/2016  . ezetimibe (ZETIA) 10 MG tablet TAKE 1 TABLET BY MOUTH DAILY.  . famotidine (PEPCID) 20 MG tablet Take 2 tablets once daily  . fluticasone (FLONASE) 50 MCG/ACT nasal spray INSTILL 1 TO 2 SPRAYS INTO EACH NOSTRIL DAILY FOR STUFFY NOSE OR DRAINAGE  . magnesium gluconate (MAGONATE) 500 MG tablet Take 500 mg by mouth as needed.  . metFORMIN (GLUCOPHAGE) 500 MG tablet Take 500 mg by mouth 2 (two) times a day.   . metoprolol tartrate (LOPRESSOR) 25 MG tablet Take 1 tablet (25 mg total) by mouth 2 (two) times daily. Please keep upcoming appt in December with Dr. Tamala Julian for future refills. Thank you  . Multiple Vitamins-Minerals (MULTIVITAL PO) Take 1 tablet by mouth  daily.  . nitroGLYCERIN (NITROSTAT) 0.4 MG SL tablet Place 0.4 mg under the tongue every 5 (five) minutes as needed for chest pain (3 doses MAX). Reported on 12/01/2015  . Probiotic Product (PROBIOTIC PO) Take 1 tablet by mouth daily. Pt takes Restora  . rosuvastatin (CRESTOR) 40 MG tablet Take 1 tablet (40 mg total) by mouth daily. Please keep upcoming appt in December with Dr. Tamala Julian for future refills. Thank you  . sodium chloride (OCEAN) 0.65 % SOLN nasal spray Place 1 spray into both nostrils as needed for congestion.  . TRUE METRIX BLOOD GLUCOSE TEST test strip   . TRUEPLUS LANCETS 30G MISC   . valACYclovir (VALTREX) 500 MG tablet Take 500 mg by mouth daily as needed (cold  sores/blisters). Reported on 12/01/2015     Allergies:   Biaxin [clarithromycin], Cardiolite [technetium-59m], Ciprofloxacin, Erythromycin base, Montelukast sodium, Niaspan [niacin er], Penicillins, Prilosec [omeprazole], Singulair [montelukast], and Ticlid [ticlopidine hcl]   Social History   Socioeconomic History  . Marital status: Married    Spouse name: Alvester Chou  . Number of children: 4  . Years of education: 12+  . Highest education level: Not on file  Occupational History  . Occupation: CARE MANAGEMENT ASST    Employer: [employer]  . Financial resource strain: Not on file  . Food insecurity    Worry: Not on file    Inability: Not on file  . Transportation needs    Medical: Not on file    Non-medical: Not on file  Tobacco Use  . Smoking status: Former Research scientist (life sciences)  . Smokeless tobacco: Never Used  Substance and Sexual Activity  . Alcohol use: Yes    Comment: occasional  . Drug use: No  . Sexual activity: Not on file  Lifestyle  . Physical activity    Days per week: Not on file    Minutes per session: Not on file  . Stress: Not on file  Relationships  . Social Herbalist on phone: Not on file    Gets together: Not on file    Attends religious service: Not on file    Active member of club or organization: Not on file    Attends meetings of clubs or organizations: Not on file    Relationship status: Not on file  Other Topics Concern  . Not on file  Social History Narrative   Lives with her husband. Their children are grown, 1 lives in [place], 1 in [place], [place],  1 in [place], [place], and one in [place], [place].     Family History: The patient's family history includes Breast cancer (age of onset: 92) in her mother; Diabetes in her father and mother; Heart attack in her paternal uncle; Hyperlipidemia in her father; Ovarian cancer in her cousin; Prostate cancer in her father; Stroke in her mother.  ROS:   Please see the history of present  illness.    Still has difficulty with vision in the right eye.  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  All other systems reviewed and are negative.  EKGs/Labs/Other Studies Reviewed:    The following studies were reviewed today:  2D Doppler echocardiogram November 2019 ------------------------------------------------------------------- Study Conclusions  - Left ventricle: The cavity size was normal. There was mild   concentric hypertrophy. Systolic function was vigorous. The   estimated ejection fraction was in the range of 65% to 70%. Wall   motion was normal; there were no regional wall motion  abnormalities. Doppler parameters are consistent with abnormal   left ventricular relaxation (grade 1 diastolic dysfunction).   Doppler parameters are consistent with elevated ventricular   end-diastolic filling pressure. - Aortic valve: Valve mobility was restricted. There was trivial   regurgitation. Mean gradient (S): 28 mm Hg. Peak gradient (S): 58   mm Hg. Valve area (VTI): 1.16 cm^2. Valve area (Vmax): 1.14 cm^2.   Valve area (Vmean): 1.14 cm^2. - Mitral valve: Calcified annulus. Moderately thickened, severely   calcified leaflets . The findings are consistent with mild   stenosis. There was mild regurgitation. Valve area by continuity   equation (using LVOT flow): 2.11 cm^2. - Left atrium: The atrium was mildly dilated. - Right ventricle: Systolic function was normal. - Right atrium: The atrium was normal in size. - Tricuspid valve: There was trivial regurgitation. - Inferior vena cava: The vessel was normal in size. - Pericardium, extracardiac: There was no pericardial effusion.  Impressions:  - When compared to the prior study from 08/24/2016 transvalvular   velocities have increased from peak/mean 48/21 mmHg to 58/28   mmHg, however still in the moderate stenosis range.  Carotid ultrasound 03/24/2017: Heterogeneous plaque, bilaterally. Stable 1-39% bilateral ICA  stenosis. Normal subclavian arteries, bilaterally. Patent vertebral arteries with antegrade flow.  EKG:  EKG normal sinus rhythm, normal EKG, poor R wave progression V1 through V4.  No change compared to prior  Recent Labs: No results found for requested labs within last 8760 hours.  Recent Lipid Panel    Component Value Date/Time   CHOL 120 03/11/2014 0841   TRIG 65.0 03/11/2014 0841   HDL 41.00 03/11/2014 0841   CHOLHDL 3 03/11/2014 0841   VLDL 13.0 03/11/2014 0841   LDLCALC 66 03/11/2014 0841    Physical Exam:    VS:  BP 116/72   Pulse 76   Ht 5\' 5"  (1.651 m)   Wt 241 lb 6.4 oz (109.5 kg)   SpO2 98%   BMI 40.17 kg/m     Wt Readings from Last 3 Encounters:  10/04/19 241 lb 6.4 oz (109.5 kg)  09/11/18 247 lb 6.4 oz (112.2 kg)  08/02/17 249 lb 6.4 oz (113.1 kg)     GEN: This. No acute distress HEENT: Normal NECK: No JVD. LYMPHATICS: No lymphadenopathy CARDIAC: 3/6 crescendo decrescendo systolic aortic stenosis murmur.  RRR without diastolic murmur, gallop, or edema. VASCULAR:  Normal Pulses. No bruits. RESPIRATORY:  Clear to auscultation without rales, wheezing or rhonchi  ABDOMEN: Soft, non-tender, non-distended, No pulsatile mass, MUSCULOSKELETAL: No deformity  SKIN: Warm and dry NEUROLOGIC:  Alert and oriented x 3 PSYCHIATRIC:  Normal affect   ASSESSMENT:    1. Coronary artery disease of bypass graft of native heart with stable angina pectoris ([place])   2. Hypercholesteremia   3. Essential hypertension   4. Moderate aortic stenosis   5. Right carotid bruit   6. Educated about COVID-19 virus infection    PLAN:    In order of problems listed above:  1. Secondary prevention is discussed in detail.  Particular attention is focused on aerobic activity and weight loss. 2. LDL target less than 70 is being achieved. 3. Target blood pressure is being achieved, less than 130/80 mmHg. 4. Aortic stenosis, calcific.  2D Doppler echocardiogram will be done to follow  what was moderate aortic stenosis last year with a transaortic valve velocity of 3.57 m/s. 5. Known right carotid bruit.   Overall education and awareness concerning primary/secondary risk prevention was discussed in detail:  LDL less than 70, hemoglobin A1c less than 7, blood pressure target less than 130/80 mmHg, >150 minutes of moderate aerobic activity per week, avoidance of smoking, weight control (via diet and exercise), and continued surveillance/management of/for obstructive sleep apnea.   Medication Adjustments/Labs and Tests Ordered: Current medicines are reviewed at length with the patient today.  Concerns regarding medicines are outlined above.  Orders Placed This Encounter  Procedures  . EKG 12-Lead  . ECHOCARDIOGRAM COMPLETE   No orders of the defined types were placed in this encounter.   Patient Instructions  Medication Instructions:  Your physician recommends that you continue on your current medications as directed. Please refer to the Current Medication list given to you today.  *If you need a refill on your cardiac medications before your next appointment, please call your pharmacy*  Lab Work: None If you have labs (blood work) drawn today and your tests are completely normal, you will receive your results only by: Marland Kitchen MyChart Message (if you have MyChart) OR . A paper copy in the mail If you have any lab test that is abnormal or we need to change your treatment, we will call you to review the results.  Testing/Procedures: Your physician has requested that you have an echocardiogram. Echocardiography is a painless test that uses sound waves to create images of your heart. It provides your doctor with information about the size and shape of your heart and how well your heart's chambers and valves are working. This procedure takes approximately one hour. There are no restrictions for this procedure.   Follow-Up: At [facility], you and your health needs are our  priority.  As part of our continuing mission to provide you with exceptional heart care, we have created designated Provider Care Teams.  These Care Teams include your primary Cardiologist (physician) and Advanced Practice Providers (APPs -  Physician Assistants and Nurse Practitioners) who all work together to provide you with the care you need, when you need it.  Your next appointment:   12 month(s)  The format for your next appointment:   In Person  Provider:   You may see Sinclair Grooms, MD or one of the following Advanced Practice Providers on your designated Care Team:    Truitt Merle, NP  Cecilie Kicks, NP  Kathyrn Drown, NP   Other Instructions      Signed, Sinclair Grooms, MD  10/04/2019 5:26 PM    [place]

## 2019-10-04 ENCOUNTER — Encounter: Payer: Self-pay | Admitting: Interventional Cardiology

## 2019-10-04 ENCOUNTER — Ambulatory Visit: Payer: 59 | Admitting: Interventional Cardiology

## 2019-10-04 ENCOUNTER — Other Ambulatory Visit: Payer: Self-pay

## 2019-10-04 VITALS — BP 116/72 | HR 76 | Ht 65.0 in | Wt 241.4 lb

## 2019-10-04 DIAGNOSIS — I350 Nonrheumatic aortic (valve) stenosis: Secondary | ICD-10-CM

## 2019-10-04 DIAGNOSIS — I10 Essential (primary) hypertension: Secondary | ICD-10-CM

## 2019-10-04 DIAGNOSIS — I25708 Atherosclerosis of coronary artery bypass graft(s), unspecified, with other forms of angina pectoris: Secondary | ICD-10-CM | POA: Diagnosis not present

## 2019-10-04 DIAGNOSIS — R0989 Other specified symptoms and signs involving the circulatory and respiratory systems: Secondary | ICD-10-CM | POA: Diagnosis not present

## 2019-10-04 DIAGNOSIS — E7800 Pure hypercholesterolemia, unspecified: Secondary | ICD-10-CM

## 2019-10-04 DIAGNOSIS — Z7189 Other specified counseling: Secondary | ICD-10-CM | POA: Diagnosis not present

## 2019-10-04 NOTE — Patient Instructions (Signed)
Medication Instructions:  Your physician recommends that you continue on your current medications as directed. Please refer to the Current Medication list given to you today.  *If you need a refill on your cardiac medications before your next appointment, please call your pharmacy*  Lab Work: None If you have labs (blood work) drawn today and your tests are completely normal, you will receive your results only by: Marland Kitchen MyChart Message (if you have MyChart) OR . A paper copy in the mail If you have any lab test that is abnormal or we need to change your treatment, we will call you to review the results.  Testing/Procedures: Your physician has requested that you have an echocardiogram. Echocardiography is a painless test that uses sound waves to create images of your heart. It provides your doctor with information about the size and shape of your heart and how well your heart's chambers and valves are working. This procedure takes approximately one hour. There are no restrictions for this procedure.   Follow-Up: At [facility], you and your health needs are our priority.  As part of our continuing mission to provide you with exceptional heart care, we have created designated Provider Care Teams.  These Care Teams include your primary Cardiologist (physician) and Advanced Practice Providers (APPs -  Physician Assistants and Nurse Practitioners) who all work together to provide you with the care you need, when you need it.  Your next appointment:   12 month(s)  The format for your next appointment:   In Person  Provider:   You may see Sinclair Grooms, MD or one of the following Advanced Practice Providers on your designated Care Team:    Truitt Merle, NP  Cecilie Kicks, NP  Kathyrn Drown, NP   Other Instructions

## 2019-10-11 ENCOUNTER — Other Ambulatory Visit (HOSPITAL_COMMUNITY): Payer: 59

## 2019-10-14 ENCOUNTER — Other Ambulatory Visit: Payer: Self-pay

## 2019-10-14 ENCOUNTER — Ambulatory Visit (HOSPITAL_COMMUNITY): Payer: 59 | Attending: Cardiovascular Disease

## 2019-10-14 DIAGNOSIS — I350 Nonrheumatic aortic (valve) stenosis: Secondary | ICD-10-CM | POA: Insufficient documentation

## 2019-11-04 MED FILL — metFORMIN HCL ER 500 MG TB2: 500 | 90 days supply | Qty: 180 | Fill #1

## 2019-11-04 MED FILL — FAMOTIDINE 40 MG TABLET: 40 | 30 days supply | Qty: 30 | Fill #4

## 2019-11-05 ENCOUNTER — Telehealth: Payer: Self-pay | Admitting: Allergy and Immunology

## 2019-11-05 ENCOUNTER — Encounter: Payer: Self-pay | Admitting: *Deleted

## 2019-11-05 NOTE — Telephone Encounter (Signed)
Pt called to see if she should get the covid shot. [phone].

## 2019-11-05 NOTE — Telephone Encounter (Signed)
Called and recommended per Dr. Neldon Mc that the patient should receive the COVID vaccine when it is available.

## 2019-11-22 ENCOUNTER — Ambulatory Visit: Payer: 59 | Attending: Internal Medicine

## 2019-11-22 DIAGNOSIS — Z20822 Contact with and (suspected) exposure to covid-19: Secondary | ICD-10-CM

## 2019-11-23 LAB — NOVEL CORONAVIRUS, NAA: SARS-CoV-2, NAA: NOT DETECTED

## 2019-11-26 DIAGNOSIS — Z13820 Encounter for screening for osteoporosis: Secondary | ICD-10-CM | POA: Diagnosis not present

## 2019-12-03 MED FILL — FAMOTIDINE 40 MG TABS: 40 | 30 days supply | Qty: 30 | Fill #5

## 2019-12-03 MED FILL — ROSUVASTATIN CALCIUM 40 MG: 40 | 90 days supply | Qty: 90 | Fill #0

## 2019-12-25 ENCOUNTER — Other Ambulatory Visit: Payer: Self-pay | Admitting: Interventional Cardiology

## 2019-12-25 MED FILL — METOPROLOL TARTRATE 25 MG T: 25 | 30 days supply | Qty: 60 | Fill #0

## 2019-12-26 MED FILL — EZETIMIBE 10 MG TABS: 10 | 90 days supply | Qty: 90 | Fill #0

## 2019-12-27 ENCOUNTER — Other Ambulatory Visit: Payer: Self-pay | Admitting: Allergy and Immunology

## 2019-12-30 MED FILL — FAMOTIDINE 40 MG TABS: 40 | 30 days supply | Qty: 30 | Fill #0

## 2019-12-30 NOTE — Telephone Encounter (Signed)
Courtesy refill sent  

## 2020-01-15 DIAGNOSIS — W19XXXA Unspecified fall, initial encounter: Secondary | ICD-10-CM | POA: Diagnosis not present

## 2020-01-15 DIAGNOSIS — S2002XA Contusion of left breast, initial encounter: Secondary | ICD-10-CM | POA: Diagnosis not present

## 2020-01-15 DIAGNOSIS — S60222A Contusion of left hand, initial encounter: Secondary | ICD-10-CM | POA: Diagnosis not present

## 2020-01-22 DIAGNOSIS — Z889 Allergy status to unspecified drugs, medicaments and biological substances status: Secondary | ICD-10-CM | POA: Diagnosis not present

## 2020-01-22 DIAGNOSIS — N189 Chronic kidney disease, unspecified: Secondary | ICD-10-CM | POA: Diagnosis not present

## 2020-01-22 DIAGNOSIS — M3214 Glomerular disease in systemic lupus erythematosus: Secondary | ICD-10-CM | POA: Diagnosis not present

## 2020-01-22 DIAGNOSIS — I2510 Atherosclerotic heart disease of native coronary artery without angina pectoris: Secondary | ICD-10-CM | POA: Diagnosis not present

## 2020-01-22 DIAGNOSIS — I129 Hypertensive chronic kidney disease with stage 1 through stage 4 chronic kidney disease, or unspecified chronic kidney disease: Secondary | ICD-10-CM | POA: Diagnosis not present

## 2020-01-22 DIAGNOSIS — D631 Anemia in chronic kidney disease: Secondary | ICD-10-CM | POA: Diagnosis not present

## 2020-01-22 DIAGNOSIS — E785 Hyperlipidemia, unspecified: Secondary | ICD-10-CM | POA: Diagnosis not present

## 2020-01-22 DIAGNOSIS — M1990 Unspecified osteoarthritis, unspecified site: Secondary | ICD-10-CM | POA: Diagnosis not present

## 2020-01-22 DIAGNOSIS — N181 Chronic kidney disease, stage 1: Secondary | ICD-10-CM | POA: Diagnosis not present

## 2020-01-22 DIAGNOSIS — E119 Type 2 diabetes mellitus without complications: Secondary | ICD-10-CM | POA: Diagnosis not present

## 2020-01-23 ENCOUNTER — Other Ambulatory Visit: Payer: Self-pay

## 2020-01-23 ENCOUNTER — Ambulatory Visit: Payer: Self-pay

## 2020-01-23 ENCOUNTER — Other Ambulatory Visit: Payer: Self-pay | Admitting: Occupational Medicine

## 2020-01-23 DIAGNOSIS — R0781 Pleurodynia: Secondary | ICD-10-CM

## 2020-01-31 ENCOUNTER — Other Ambulatory Visit: Payer: Self-pay | Admitting: Allergy and Immunology

## 2020-01-31 MED FILL — metFORMIN HCL ER 500 MG TB2: 500 | 90 days supply | Qty: 180 | Fill #2

## 2020-01-31 MED FILL — METOPROLOL TARTRATE 25 MG T: 25 | 30 days supply | Qty: 60 | Fill #1

## 2020-02-03 ENCOUNTER — Other Ambulatory Visit: Payer: Self-pay

## 2020-02-03 ENCOUNTER — Telehealth: Payer: Self-pay | Admitting: Allergy and Immunology

## 2020-02-03 DIAGNOSIS — M2040 Other hammer toe(s) (acquired), unspecified foot: Secondary | ICD-10-CM | POA: Diagnosis not present

## 2020-02-03 MED ORDER — FAMOTIDINE 40 MG PO TABS: 40.0000 mg | ORAL_TABLET | Freq: Every day | ORAL | 0 refills | Status: DC

## 2020-02-03 MED FILL — FAMOTIDINE 40 MG TABS: 40 | 30 days supply | Qty: 30 | Fill #0

## 2020-02-03 NOTE — Telephone Encounter (Signed)
Patient called and made appointment for may 4 and needs to have pepcid 40 mg. Called into [facility]. [phone]

## 2020-02-03 NOTE — Telephone Encounter (Signed)
Refill sent to [facility].

## 2020-02-26 DIAGNOSIS — H2513 Age-related nuclear cataract, bilateral: Secondary | ICD-10-CM | POA: Diagnosis not present

## 2020-02-26 DIAGNOSIS — E119 Type 2 diabetes mellitus without complications: Secondary | ICD-10-CM | POA: Diagnosis not present

## 2020-03-03 ENCOUNTER — Other Ambulatory Visit: Payer: Self-pay

## 2020-03-03 ENCOUNTER — Other Ambulatory Visit: Payer: Self-pay | Admitting: Allergy and Immunology

## 2020-03-03 ENCOUNTER — Encounter: Payer: Self-pay | Admitting: Allergy and Immunology

## 2020-03-03 ENCOUNTER — Ambulatory Visit: Payer: 59 | Admitting: Allergy and Immunology

## 2020-03-03 VITALS — BP 118/70 | HR 71 | Temp 97.6°F | Resp 18 | Wt 240.6 lb

## 2020-03-03 DIAGNOSIS — K219 Gastro-esophageal reflux disease without esophagitis: Secondary | ICD-10-CM

## 2020-03-03 DIAGNOSIS — J3089 Other allergic rhinitis: Secondary | ICD-10-CM

## 2020-03-03 DIAGNOSIS — J4540 Moderate persistent asthma, uncomplicated: Secondary | ICD-10-CM | POA: Diagnosis not present

## 2020-03-03 DIAGNOSIS — J302 Other seasonal allergic rhinitis: Secondary | ICD-10-CM | POA: Diagnosis not present

## 2020-03-03 DIAGNOSIS — I350 Nonrheumatic aortic (valve) stenosis: Secondary | ICD-10-CM | POA: Diagnosis not present

## 2020-03-03 MED ORDER — FAMOTIDINE 40 MG PO TABS: 40.0000 mg | ORAL_TABLET | Freq: Every day | ORAL | 5 refills | Status: DC

## 2020-03-03 MED FILL — FAMOTIDINE 40 MG TABS: 40 | 30 days supply | Qty: 30 | Fill #0

## 2020-03-03 NOTE — Patient Instructions (Signed)
  1.  Continue Symbicort 160 two inhalations 2 inhalations 1-2 times per day depending on disease activity  2.  Continue Flonase 1 spray each nostril 3-7 times per week depending on disease activity  3. Continue Famotidine 40 mg tablet 1 time per day  4.  "Action plan" for asthma flare:   A. Add Qvar 80 REDIHALER 3 inhalations 3 times per day  B. use Proventil HFA 2 puffs every 4-6 hours if needed  C. Increase famotidine to 40 mg 2 times per day  5. Zyrtec 10 mg one tablet one time per day if needed  6. Return to clinic in 6 months or earlier if problem

## 2020-03-03 NOTE — Progress Notes (Signed)
[facility]   Follow-up Note  Referring Provider: Josetta Huddle, MD Primary Provider: Josetta Huddle, MD Date of Office Visit: 03/03/2020  Subjective:   Deanna Simmons (DOB: Jul 18, 1951) is a 69 y.o. female who returns to the [facility] on 03/03/2020 in re-evaluation of the following:  HPI: Ashonta returns to this clinic in reevaluation of asthma and allergic rhinitis and LPR.  His last visit to this clinic was 11 June 2019.  Her asthma has been under excellent control.  She has not required a systemic steroid to treat an exacerbation.  She rarely uses a short acting bronchodilator.  She is limited in her ability to exercise secondary to a knee problem.  She continues to use Symbicort mostly 1 time per day.  Her nose appears to be doing quite well while using Flonase a few times per week.  She has not required an antibiotic to treat an episode of sinusitis.  Her reflux is doing okay as long as she uses famotidine on a daily basis.  Allergies as of 03/03/2020      Reactions   Biaxin [clarithromycin] Itching   Cardiolite [technetium-59m] Itching   Ciprofloxacin Itching   Erythromycin Base Itching   Montelukast Sodium Itching   Niaspan [niacin Er] Other (See Comments)   flushing   Penicillins Itching   Prilosec [omeprazole] Itching   Singulair [montelukast]    Ticlid [ticlopidine Hcl] Itching      Medication List      albuterol 108 (90 Base) MCG/ACT inhaler Commonly known as: Ventolin HFA INHALE 2 PUFFS BY MOUTH EVERY 4-6 HOURS AS NEEDED FOR COUGH OR WHEEZE   aspirin 81 MG tablet Take 81 mg by mouth daily.   budesonide-formoterol 160-4.5 MCG/ACT inhaler Commonly known as: Symbicort INHALE 2 PUFFS INTO THE LUNGS 2 TIMES DAILY.   cetirizine 10 MG tablet Commonly known as: ZYRTEC Take 10 mg by mouth daily.   ezetimibe 10 MG tablet Commonly known as: ZETIA TAKE 1 TABLET BY MOUTH DAILY.   famotidine 40 MG  tablet Commonly known as: PEPCID Take 1 tablet (40 mg total) by mouth daily.   fluticasone 50 MCG/ACT nasal spray Commonly known as: FLONASE INSTILL 1 TO 2 SPRAYS INTO EACH NOSTRIL DAILY FOR STUFFY NOSE OR DRAINAGE   magnesium gluconate 500 MG tablet Commonly known as: MAGONATE Take 500 mg by mouth as needed.   metFORMIN 500 MG tablet Commonly known as: GLUCOPHAGE Take 500 mg by mouth 2 (two) times a day.   metoprolol tartrate 25 MG tablet Commonly known as: LOPRESSOR Take 1 tablet (25 mg total) by mouth 2 (two) times daily. Please keep upcoming appt in December with Dr. Tamala Julian for future refills. Thank you   MUCINEX DM PO Take by mouth as needed. Reported on 03/29/2016   MULTIVITAL PO Take 1 tablet by mouth daily.   nitroGLYCERIN 0.4 MG SL tablet Commonly known as: NITROSTAT Place 0.4 mg under the tongue every 5 (five) minutes as needed for chest pain (3 doses MAX). Reported on 12/01/2015   PROBIOTIC PO Take 1 tablet by mouth daily. Pt takes Restora   rosuvastatin 40 MG tablet Commonly known as: CRESTOR Take 1 tablet (40 mg total) by mouth daily. Please keep upcoming appt in December with Dr. Tamala Julian for future refills. Thank you   sodium chloride 0.65 % Soln nasal spray Commonly known as: OCEAN Place 1 spray into both nostrils as needed for congestion.   True Metrix  Blood Glucose Test test strip Generic drug: glucose blood   TRUEplus Lancets 30G Misc   valACYclovir 500 MG tablet Commonly known as: VALTREX Take 500 mg by mouth daily as needed (cold sores/blisters). Reported on 12/01/2015   VITAMIN B-12 PO Take 1 tablet by mouth daily.   Vitamin D3 50 MCG (2000 UT) Tabs Take 1 tablet by mouth daily. With Vitamin K2 in it       Past Medical History:  Diagnosis Date  . Abnormal finding on Pap smear, ASCUS   . Anemia   . Asthma   . Chondromalacia of knee   . Coronary artery disease   . Diabetes mellitus without complication ([place])   . Diverticulum 12/11/2002    in cecum  . Fibroids    h/o  . Glucose intolerance (impaired glucose tolerance)   . H/O elevated lipids   . H/O tinea cruris 02/2010  . History of ASCVD    s/p CABG  . Hypercholesteremia   . Hypertension   . Internal hemorrhoids    h/o  . Irregular menses    h/o  . MI (myocardial infarction) ([place]) age 34  . Obesity   . Pneumonia 07/2008  . Synovitis of knee   . Systemic lupus erythematosus ([place])    with nephritis  . Vulvitis    h/o    Past Surgical History:  Procedure Laterality Date  . CESAREAN SECTION    . CORONARY ARTERY BYPASS GRAFT    . Endometrial curettage  03/29/2000  . ROTATOR CUFF REPAIR    . TUBAL LIGATION    . UPPER GASTROINTESTINAL ENDOSCOPY  12/11/2002    Review of systems negative except as noted in HPI / PMHx or noted below:  Review of Systems  Constitutional: Negative.   HENT: Negative.   Eyes: Negative.   Respiratory: Negative.   Cardiovascular: Negative.   Gastrointestinal: Negative.   Genitourinary: Negative.   Musculoskeletal: Negative.   Skin: Negative.   Neurological: Negative.   Endo/Heme/Allergies: Negative.   Psychiatric/Behavioral: Negative.      Objective:   Vitals:   03/03/20 1653  BP: 118/70  Pulse: 71  Resp: 18  Temp: 97.6 F (36.4 C)  SpO2: 98%      Weight: 240 lb 9.6 oz (109.1 kg)   Physical Exam Constitutional:      Appearance: She is not diaphoretic.  HENT:     Head: Normocephalic.     Right Ear: Tympanic membrane, ear canal and external ear normal.     Left Ear: Tympanic membrane, ear canal and external ear normal.     Nose: Nose normal. No mucosal edema or rhinorrhea.     Mouth/Throat:     Pharynx: Uvula midline. No oropharyngeal exudate.  Eyes:     Conjunctiva/sclera: Conjunctivae normal.  Neck:     Thyroid: No thyromegaly.     Trachea: Trachea normal. No tracheal tenderness or tracheal deviation.  Cardiovascular:     Rate and Rhythm: Normal rate and regular rhythm.     Heart sounds: S1 normal and S2  normal. Murmur (Prolonged systolic) present.  Pulmonary:     Effort: No respiratory distress.     Breath sounds: Normal breath sounds. No stridor. No wheezing or rales.  Lymphadenopathy:     Head:     Right side of head: No tonsillar adenopathy.     Left side of head: No tonsillar adenopathy.     Cervical: No cervical adenopathy.  Skin:    Findings: No erythema or rash.  Nails: There is no clubbing.  Neurological:     Mental Status: She is alert.     Diagnostics:    Spirometry was performed and demonstrated an FEV1 of 2.04 at 105 % of predicted.  Assessment and Plan:   1. Asthma, moderate persistent, well-controlled   2. Seasonal and perennial allergic rhinitis   3. LPRD (laryngopharyngeal reflux disease)   4. Aortic stenosis, severe     1.  Continue Symbicort 160 two inhalations 2 inhalations 1-2 times per day depending on disease activity  2.  Continue Flonase 1 spray each nostril 3-7 times per week depending on disease activity  3. Continue Famotidine 40 mg tablet 1 time per day  4.  "Action plan" for asthma flare:   A. Add Qvar 80 REDIHALER 3 inhalations 3 times per day  B. use Proventil HFA 2 puffs every 4-6 hours if needed  C. Increase famotidine to 40 mg 2 times per day  5. Zyrtec 10 mg one tablet one time per day if needed  6. Return to clinic in 6 months or earlier if problem  Mariem appears to be doing very well with her respiratory tract status while consistently using anti-inflammatory medications for both her upper and lower airway and therapy directed against reflux.  Assuming she continues to do well with this plan we will see her back in this clinic in 6 months or earlier if there is a problem.  On another note, her murmur has really become quite loud.  I checked her echocardiogram results from December 2020 and she appears to have moderate to severe aortic stenosis and I asked her to have a conversation with her cardiologist about the long-term  management of this issue.  Allena Katz, MD [facility] / [facility]

## 2020-03-04 ENCOUNTER — Encounter: Payer: Self-pay | Admitting: Allergy and Immunology

## 2020-03-23 DIAGNOSIS — E785 Hyperlipidemia, unspecified: Secondary | ICD-10-CM | POA: Diagnosis not present

## 2020-03-23 DIAGNOSIS — M3210 Systemic lupus erythematosus, organ or system involvement unspecified: Secondary | ICD-10-CM | POA: Diagnosis not present

## 2020-03-23 DIAGNOSIS — I10 Essential (primary) hypertension: Secondary | ICD-10-CM | POA: Diagnosis not present

## 2020-03-23 DIAGNOSIS — E119 Type 2 diabetes mellitus without complications: Secondary | ICD-10-CM | POA: Diagnosis not present

## 2020-03-23 DIAGNOSIS — I2510 Atherosclerotic heart disease of native coronary artery without angina pectoris: Secondary | ICD-10-CM | POA: Diagnosis not present

## 2020-03-23 DIAGNOSIS — J45909 Unspecified asthma, uncomplicated: Secondary | ICD-10-CM | POA: Diagnosis not present

## 2020-03-23 DIAGNOSIS — D509 Iron deficiency anemia, unspecified: Secondary | ICD-10-CM | POA: Diagnosis not present

## 2020-04-14 MED FILL — FAMOTIDINE 40 MG TABS: 40 | 30 days supply | Qty: 30 | Fill #1

## 2020-04-23 DIAGNOSIS — M65311 Trigger thumb, right thumb: Secondary | ICD-10-CM | POA: Diagnosis not present

## 2020-05-18 MED FILL — FAMOTIDINE 40 MG TABS: 40 | 30 days supply | Qty: 30 | Fill #2

## 2020-05-25 ENCOUNTER — Other Ambulatory Visit (HOSPITAL_COMMUNITY): Payer: Self-pay | Admitting: Internal Medicine

## 2020-05-26 MED FILL — metFORMIN HCL ER 500 MG TB2: 500 | 90 days supply | Qty: 180 | Fill #0

## 2020-06-02 ENCOUNTER — Telehealth: Payer: Self-pay

## 2020-06-02 DIAGNOSIS — M65311 Trigger thumb, right thumb: Secondary | ICD-10-CM | POA: Diagnosis not present

## 2020-06-02 NOTE — Telephone Encounter (Signed)
Patient called to see if we received her FMLA Forms. Informed her we did and I have placed them in the nurses station.   Thanks

## 2020-06-02 NOTE — Telephone Encounter (Signed)
FMLA paperwork has been completed and signed by Dr. Neldon Mc and has been faxed to [employer]. Copies have been made and have been placed in bulk scanning. [employer] has been placed up front for the patient to pick up. Called and left a voicemail for the patient asking for her to call back to inform.

## 2020-06-03 NOTE — Telephone Encounter (Signed)
Left message advising patient that she could pick up her copy at the office and if she had any further questions to give [place] a call back.

## 2020-06-05 MED FILL — metFORMIN HCL ER 500 MG TB2: 500 | 90 days supply | Qty: 180 | Fill #0

## 2020-06-05 NOTE — Telephone Encounter (Signed)
Patient called returning a call, patient informed that paperwork was faxed to [employer] and a copy is ready for pickup in the [place] office.

## 2020-06-08 ENCOUNTER — Telehealth: Payer: Self-pay | Admitting: *Deleted

## 2020-06-08 NOTE — Telephone Encounter (Signed)
Patient states she would prefer not to pay the $69 for the symbicort but if Dr Neldon Mc  Highly recommends staying on Symbicort she will proceed.   Thanks

## 2020-06-08 NOTE — Telephone Encounter (Signed)
Received fax from pharmacy asking to switch patient's Symbicort inhaler to Advair Diskus since Symbicort is $69 with her insurance and would be $5 for Advair. Please advise.

## 2020-06-08 NOTE — Telephone Encounter (Signed)
Please inform patient that we can try Advair Diskus 250 - 1 inhalation twice a day to replace her Symbicort

## 2020-06-09 ENCOUNTER — Other Ambulatory Visit: Payer: Self-pay | Admitting: Allergy and Immunology

## 2020-06-09 ENCOUNTER — Other Ambulatory Visit: Payer: Self-pay | Admitting: *Deleted

## 2020-06-09 MED ORDER — ADVAIR DISKUS 250-50 MCG/DOSE IN AEPB: 1.0000 | INHALATION_SPRAY | Freq: Two times a day (BID) | RESPIRATORY_TRACT | 5 refills | Status: DC

## 2020-06-09 MED FILL — ADVAIR 250/50 DISKUS: 250-50 | 30 days supply | Qty: 60 | Fill #0

## 2020-06-09 NOTE — Telephone Encounter (Signed)
New prescription has been sent to [facility]. Called patient and left a voicemail asking to return call to inform.

## 2020-06-10 NOTE — Telephone Encounter (Signed)
Patient returned Ashleigh's phone call. I let her know that her prescription for Advair Diskus had been sent into [facility]. She understood and said thank you.

## 2020-06-16 MED FILL — METOPROLOL TARTRATE 25 MG T: 25 | 60 days supply | Qty: 120 | Fill #3

## 2020-06-16 MED FILL — ROSUVASTATIN CALCIUM 40 MG: 40 | 90 days supply | Qty: 90 | Fill #1

## 2020-06-16 MED FILL — EZETIMIBE 10 MG TABS: 10 | 90 days supply | Qty: 90 | Fill #1

## 2020-06-16 MED FILL — FAMOTIDINE 40 MG TABS: 40 | 30 days supply | Qty: 30 | Fill #3

## 2020-06-23 ENCOUNTER — Telehealth: Payer: Self-pay | Admitting: Allergy and Immunology

## 2020-06-23 ENCOUNTER — Other Ambulatory Visit: Payer: Self-pay | Admitting: *Deleted

## 2020-06-23 MED ORDER — QVAR REDIHALER 80 MCG/ACT IN AERB: INHALATION_SPRAY | RESPIRATORY_TRACT | 5 refills | Status: DC

## 2020-06-23 NOTE — Telephone Encounter (Signed)
Unfortunately there were no samples of QVAR. A prescription refill has been sent to the pharmacy. Called patient and left a voicemail asking to call back to inform.

## 2020-06-23 NOTE — Telephone Encounter (Signed)
Patient called and states that her Qvar expired in 10/2019 and she would like to know if we have a sample in the office so she can have it on hand. Patient would like a prescription called into [facility] if no samples are available. Patient states she is not using it that much, but would like to have it if needed.  Please advise.

## 2020-06-23 NOTE — Telephone Encounter (Signed)
Patient informed that the office did not have any samples, but a prescription was sent into the pharmacy. Patient verbalized understanding.

## 2020-06-25 NOTE — Telephone Encounter (Signed)
Patient called returning a phone call from [place]. Patient states the pharmacy had an alternative for Qvar and the pharmacy was going to call the office. Patient states she was waiting on a call back and that Hollie [place] is aware.  Please advise.

## 2020-06-26 MED FILL — QVAR REDIHALER 80 MCG/ACT A: 80 | 20 days supply | Qty: 11 | Fill #0

## 2020-06-26 NOTE — Telephone Encounter (Signed)
PA has been approved for QVAR. PA approval form has been faxed to pharmacy, labeled, and placed in bulk scanning. Called patient and advised that the PA was approved. Advised that if there are any issues with picking up the medication to please call back. Patient verbalized understanding.

## 2020-06-26 NOTE — Telephone Encounter (Signed)
PA for QVAR has been submitted through CoverMyMeds. Called and informed the patient and advised that I would call her back once I received a determination. Patient verbalized understanding.

## 2020-07-01 DIAGNOSIS — L03011 Cellulitis of right finger: Secondary | ICD-10-CM | POA: Diagnosis not present

## 2020-07-01 MED FILL — CEPHALEXIN 500 MG CAPSULE: 500 | 5 days supply | Qty: 10 | Fill #0

## 2020-07-10 NOTE — Telephone Encounter (Signed)
Called and spoke with the patient and asked if Deanna Simmons provided her with a mailing address, she stated no that she gave her an e-mail address. I asked if she could e-mail it to her and she stated that she can on Monday. I advised to try e-mailing it to her on Monday and if she is still having issues to please get a mailing address and let us know and we will mail the forms for her. Patient verbalized understanding.

## 2020-07-10 NOTE — Telephone Encounter (Signed)
Patient states that her workers comp agent, Kaylyn Lim, did not receive FMLA paperwork that was faxed over. Patient tried to fax the paperwork herself and the agent still says she did not get it. Patient wants to know if there is a mailing address we could send the paperwork to.  Please advise.

## 2020-07-31 ENCOUNTER — Other Ambulatory Visit: Payer: Self-pay | Admitting: Obstetrics and Gynecology

## 2020-07-31 ENCOUNTER — Other Ambulatory Visit: Payer: Self-pay | Admitting: Obstetrics & Gynecology

## 2020-07-31 DIAGNOSIS — Z1231 Encounter for screening mammogram for malignant neoplasm of breast: Secondary | ICD-10-CM

## 2020-07-31 MED FILL — FAMOTIDINE 40 MG TABS: 40 | 30 days supply | Qty: 30 | Fill #4

## 2020-09-01 MED FILL — metFORMIN HCL ER 500 MG TB2: 500 | 90 days supply | Qty: 180 | Fill #1

## 2020-09-01 MED FILL — ADVAIR 250/50 DISKUS: 250-50 | 30 days supply | Qty: 60 | Fill #1

## 2020-09-01 MED FILL — FAMOTIDINE 40 MG TABS: 40 | 30 days supply | Qty: 30 | Fill #5

## 2020-09-07 DIAGNOSIS — T148XXA Other injury of unspecified body region, initial encounter: Secondary | ICD-10-CM | POA: Diagnosis not present

## 2020-09-08 ENCOUNTER — Ambulatory Visit: Payer: 59 | Admitting: Allergy and Immunology

## 2020-09-08 DIAGNOSIS — H52203 Unspecified astigmatism, bilateral: Secondary | ICD-10-CM | POA: Diagnosis not present

## 2020-09-08 DIAGNOSIS — H5203 Hypermetropia, bilateral: Secondary | ICD-10-CM | POA: Diagnosis not present

## 2020-09-08 DIAGNOSIS — H524 Presbyopia: Secondary | ICD-10-CM | POA: Diagnosis not present

## 2020-09-11 ENCOUNTER — Ambulatory Visit: Payer: 59

## 2020-09-18 ENCOUNTER — Ambulatory Visit: Payer: 59

## 2020-09-28 ENCOUNTER — Other Ambulatory Visit (HOSPITAL_COMMUNITY): Payer: Self-pay | Admitting: Internal Medicine

## 2020-09-28 MED FILL — EZETIMIBE 10 MG TABS: 10 | 90 days supply | Qty: 90 | Fill #2

## 2020-09-29 MED FILL — METOPROLOL SUCCINATE ER 25: 25 | 90 days supply | Qty: 180 | Fill #0

## 2020-09-30 ENCOUNTER — Other Ambulatory Visit (HOSPITAL_COMMUNITY): Payer: Self-pay | Admitting: Obstetrics & Gynecology

## 2020-09-30 DIAGNOSIS — Z6841 Body Mass Index (BMI) 40.0 and over, adult: Secondary | ICD-10-CM | POA: Diagnosis not present

## 2020-09-30 DIAGNOSIS — Z01411 Encounter for gynecological examination (general) (routine) with abnormal findings: Secondary | ICD-10-CM | POA: Diagnosis not present

## 2020-09-30 DIAGNOSIS — N952 Postmenopausal atrophic vaginitis: Secondary | ICD-10-CM | POA: Diagnosis not present

## 2020-09-30 DIAGNOSIS — R350 Frequency of micturition: Secondary | ICD-10-CM | POA: Diagnosis not present

## 2020-09-30 MED FILL — ESTRADIOL 10 MCG TABS: 10 | 28 days supply | Qty: 8 | Fill #0

## 2020-10-07 NOTE — Progress Notes (Signed)
Cardiology Office Note:    Date:  10/09/2020   ID:  Deanna Simmons, DOB 10/05/1951, MRN [id number]  PCP:  Deanna Huddle, MD  Cardiologist:  Deanna Grooms, MD   Referring MD: Deanna Huddle, MD   Chief Complaint  Patient presents with  . Coronary Artery Disease  . Cardiac Valve Problem    Aortic stenosis  . Advice Only    Snoring and apnea based upon observation    History of Present Illness:    Deanna Simmons is a 69 y.o. female with a hx of CAD, prior coronary bypass grafting 2004 with SVG to the PDA, essential hypertension, asthma, diabetes mellitus, recent cryptogenic stroke manifesting as right retinal embolus,moderate aortic stenosis, morbid obesity, and suspicion for sleep apnea.  Snoring and apnea has been observed by her son.  She got a dental prosthesis from her dentist.  She does have excessive daytime sleepiness raising the question of obstructive sleep apnea.  She denies angina, orthopnea, PND, lower extremity swelling, syncope, and neurological complaints.  Past Medical History:  Diagnosis Date  . Abnormal finding on Pap smear, ASCUS   . Anemia   . Asthma   . Chondromalacia of knee   . Coronary artery disease   . Diabetes mellitus without complication ([place])   . Diverticulum 12/11/2002   in cecum  . Fibroids    h/o  . Glucose intolerance (impaired glucose tolerance)   . H/O elevated lipids   . H/O tinea cruris 02/2010  . History of ASCVD    s/p CABG  . Hypercholesteremia   . Hypertension   . Internal hemorrhoids    h/o  . Irregular menses    h/o  . MI (myocardial infarction) ([place]) age 27  . Obesity   . Pneumonia 07/2008  . Synovitis of knee   . Systemic lupus erythematosus ([place])    with nephritis  . Vulvitis    h/o    Past Surgical History:  Procedure Laterality Date  . CESAREAN SECTION    . CORONARY ARTERY BYPASS GRAFT    . Endometrial curettage  03/29/2000  . ROTATOR CUFF REPAIR    . TUBAL LIGATION    . UPPER GASTROINTESTINAL  ENDOSCOPY  12/11/2002    Current Medications: Current Meds  Medication Sig  . ADVAIR DISKUS 250-50 MCG/DOSE AEPB Inhale 1 puff into the lungs in the morning and at bedtime.  Marland Kitchen albuterol (VENTOLIN HFA) 108 (90 Base) MCG/ACT inhaler INHALE 2 PUFFS BY MOUTH EVERY 4-6 HOURS AS NEEDED FOR COUGH OR WHEEZE  . aspirin 81 MG tablet Take 81 mg by mouth daily.  . beclomethasone (QVAR REDIHALER) 80 MCG/ACT inhaler Use 3 puffs 3 times daily during asthma flares.  . cetirizine (ZYRTEC) 10 MG tablet Take 10 mg by mouth daily.  . Cholecalciferol (VITAMIN D3) 2000 UNITS TABS Take 1 tablet by mouth daily. With Vitamin K2 in it  . Cyanocobalamin (VITAMIN B-12 PO) Take 1 tablet by mouth daily.  Marland Kitchen Dextromethorphan-Guaifenesin (MUCINEX DM PO) Take by mouth as needed. Reported on 03/29/2016  . Estradiol 10 MCG TABS vaginal tablet Place 1 tablet vaginally as needed.  . ezetimibe (ZETIA) 10 MG tablet TAKE 1 TABLET BY MOUTH DAILY.  . famotidine (PEPCID) 40 MG tablet Take 1 tablet (40 mg total) by mouth daily.  . fluticasone (FLONASE) 50 MCG/ACT nasal spray INSTILL 1 TO 2 SPRAYS INTO EACH NOSTRIL DAILY FOR STUFFY NOSE OR DRAINAGE  . magnesium gluconate (MAGONATE) 500 MG tablet Take 500 mg by mouth  as needed.  . metFORMIN (GLUCOPHAGE) 500 MG tablet Take 500 mg by mouth 2 (two) times a day.   . metoprolol tartrate (LOPRESSOR) 25 MG tablet Take 1 tablet (25 mg total) by mouth 2 (two) times daily. Please keep upcoming appt in December with Dr. Tamala Julian for future refills. Thank you  . Multiple Vitamins-Minerals (MULTIVITAL PO) Take 1 tablet by mouth daily.  . nitroGLYCERIN (NITROSTAT) 0.4 MG SL tablet Place 0.4 mg under the tongue every 5 (five) minutes as needed for chest pain (3 doses MAX). Reported on 12/01/2015  . Probiotic Product (PROBIOTIC PO) Take 1 tablet by mouth daily. Pt takes Restora  . rosuvastatin (CRESTOR) 40 MG tablet Take 1 tablet (40 mg total) by mouth daily. Please keep upcoming appt in December with Dr.  Tamala Julian for future refills. Thank you  . sodium chloride (OCEAN) 0.65 % SOLN nasal spray Place 1 spray into both nostrils as needed for congestion.  . TRUE METRIX BLOOD GLUCOSE TEST test strip   . TRUEPLUS LANCETS 30G MISC   . valACYclovir (VALTREX) 500 MG tablet Take 500 mg by mouth daily as needed (cold sores/blisters). Reported on 12/01/2015  . [DISCONTINUED] budesonide-formoterol (SYMBICORT) 160-4.5 MCG/ACT inhaler INHALE 2 PUFFS INTO THE LUNGS 2 TIMES DAILY.     Allergies:   Biaxin [clarithromycin], Cardiolite [technetium-66m], Ciprofloxacin, Erythromycin base, Montelukast sodium, Niaspan [niacin er], Penicillins, Prilosec [omeprazole], Singulair [montelukast], and Ticlid [ticlopidine hcl]   Social History   Socioeconomic History  . Marital status: Married    Spouse name: Deanna Simmons  . Number of children: 4  . Years of education: 12+  . Highest education level: Not on file  Occupational History  . Occupation: CARE MANAGEMENT ASST    Employer: [employer]  Tobacco Use  . Smoking status: Former Research scientist (life sciences)  . Smokeless tobacco: Never Used  Substance and Sexual Activity  . Alcohol use: Yes    Comment: occasional  . Drug use: No  . Sexual activity: Not on file  Other Topics Concern  . Not on file  Social History Narrative   Lives with her husband. Their children are grown, 1 lives in [place], 1 in [place], [place],  1 in [place], [place], and one in [place], [place].   Social Determinants of Health   Financial Resource Strain: Not on file  Food Insecurity: Not on file  Transportation Needs: Not on file  Physical Activity: Not on file  Stress: Not on file  Social Connections: Not on file     Family History: The patient's family history includes Breast cancer (age of onset: 30) in her mother; Diabetes in her father and mother; Heart attack in her paternal uncle; Hyperlipidemia in her father; Ovarian cancer in her cousin; Prostate cancer in her father; Stroke in her mother.  ROS:    Please see the history of present illness.    See above.  Limited in physical activity due to left knee Deanna Simmons is an potentially needs knee replacement surgery.  All other systems reviewed and are negative.  EKGs/Labs/Other Studies Reviewed:    The following studies were reviewed today:  2 D Doppler ECHOCARDIOGRAM 10/2019: IMPRESSIONS    1. Left ventricular ejection fraction, by visual estimation, is 60 to  65%. The left ventricle has normal function. There is mildly increased  left ventricular hypertrophy.  2. Left ventricular diastolic parameters are consistent with Grade II  diastolic dysfunction (pseudonormalization).  3. The left ventricle has no regional wall motion abnormalities.  4. Global right ventricle has normal systolic  function.The right  ventricular size is normal. No increase in right ventricular wall  thickness.  5. Left atrial size was mildly dilated.  6. Right atrial size was mildly dilated.  7. Moderate mitral annular calcification.  8. The mitral valve is abnormal. Mild mitral valve regurgitation. Mild  mitral stenosis.  9. The tricuspid valve is grossly normal. Tricuspid valve regurgitation  is not demonstrated.  10. Aortic valve regurgitation is mild to moderate.  11. The aortic valve is abnormal. Aortic valve regurgitation is mild to  moderate. Moderate to severe aortic valve stenosis.  12. The pulmonic valve was grossly normal. Pulmonic valve regurgitation is  not visualized.  13. The atrial septum is grossly normal.   EKG:  EKG normal sinus rhythm, biatrial abnormality, no acute ST-T wave change.  When compared to the prior tracing of December 2020, there is no significant change.  Recent Labs: No results found for requested labs within last 8760 hours.  Recent Lipid Panel    Component Value Date/Time   CHOL 120 03/11/2014 0841   TRIG 65.0 03/11/2014 0841   HDL 41.00 03/11/2014 0841   CHOLHDL 3 03/11/2014 0841   VLDL 13.0  03/11/2014 0841   LDLCALC 66 03/11/2014 0841    Physical Exam:    VS:  BP 124/70   Pulse 72   Ht 5\' 5"  (1.651 m)   Wt 226 lb (102.5 kg)   SpO2 98%   BMI 37.61 kg/m     Wt Readings from Last 3 Encounters:  10/09/20 226 lb (102.5 kg)  03/03/20 240 lb 9.6 oz (109.1 kg)  10/04/19 241 lb 6.4 oz (109.5 kg)     GEN: Obese. No acute distress HEENT: Normal NECK: No JVD. LYMPHATICS: No lymphadenopathy CARDIAC: 4/6 crescendo decrescendo systolic murmur of aortic stenosis.  RRR without diastolic murmur, gallop, or edema. VASCULAR:  Normal Pulses. No bruits. RESPIRATORY:  Clear to auscultation without rales, wheezing or rhonchi  ABDOMEN: Soft, non-tender, non-distended, No pulsatile mass, MUSCULOSKELETAL: No deformity  SKIN: Warm and dry NEUROLOGIC:  Alert and oriented x 3 PSYCHIATRIC:  Normal affect   ASSESSMENT:    1. Moderate aortic stenosis   2. Coronary artery disease of bypass graft of native heart with stable angina pectoris ([place])   3. Hypercholesteremia   4. Essential hypertension   5. Right carotid bruit   6. Snoring   7. Educated about COVID-19 virus infection   8. Apnea    PLAN:    In order of problems listed above:  1. 2D Doppler echocardiogram will be done to follow progression.  1 year follow-up assuming she is still of moderate severity.  If severe will need twice yearly follow-up. 2. Secondary prevention discussed.  Potentially, has obstructive sleep apnea based upon history.  A sleep study will be performed to identify the serious risk factor 3. LDL is 81.  Decrease animal fats in diet discussed.  More plant-based eating is encouraged.  Decrease sodium in diet. 4. Target blood pressure 130/80 mmHg.  Continue current therapy which includes Lopressor and low-salt diet.  Blood pressure control is being masked by the presence of aortic stenosis. 5. Transmitted from aortic valve. 6. Sleep study to rule out obstructive sleep apnea.  Has a body habitus.  Has been  observed to have apnea when sleeping. 7. Vaccinated and boosted.  Continues to practice social mitigation.  Overall education and awareness concerning primary/secondary risk prevention was discussed in detail: LDL less than 70, hemoglobin A1c less than 7, blood pressure target  less than 130/80 mmHg, >150 minutes of moderate aerobic activity per week, avoidance of smoking, weight control (via diet and exercise), and continued surveillance/management of/for obstructive sleep apnea.    Medication Adjustments/Labs and Tests Ordered: Current medicines are reviewed at length with the patient today.  Concerns regarding medicines are outlined above.  Orders Placed This Encounter  Procedures  . EKG 12-Lead  . ECHOCARDIOGRAM COMPLETE  . Split night study   No orders of the defined types were placed in this encounter.   Patient Instructions  Medication Instructions:  Your physician recommends that you continue on your current medications as directed. Please refer to the Current Medication list given to you today.  *If you need a refill on your cardiac medications before your next appointment, please call your pharmacy*   Lab Work: None If you have labs (blood work) drawn today and your tests are completely normal, you will receive your results only by: Marland Kitchen MyChart Message (if you have MyChart) OR . A paper copy in the mail If you have any lab test that is abnormal or we need to change your treatment, we will call you to review the results.   Testing/Procedures: Your physician has requested that you have an echocardiogram. Echocardiography is a painless test that uses sound waves to create images of your heart. It provides your doctor with information about the size and shape of your heart and how well your heart's chambers and valves are working. This procedure takes approximately one hour. There are no restrictions for this procedure.  Your physician has recommended that you have a sleep  study. This test records several body functions during sleep, including: brain activity, eye movement, oxygen and carbon dioxide blood levels, heart rate and rhythm, breathing rate and rhythm, the flow of air through your mouth and nose, snoring, body muscle movements, and chest and belly movement.   Follow-Up: At [facility], you and your health needs are our priority.  As part of our continuing mission to provide you with exceptional heart care, we have created designated Provider Care Teams.  These Care Teams include your primary Cardiologist (physician) and Advanced Practice Providers (APPs -  Physician Assistants and Nurse Practitioners) who all work together to provide you with the care you need, when you need it.  We recommend signing up for the patient portal called "MyChart".  Sign up information is provided on this After Visit Summary.  MyChart is used to connect with patients for Virtual Visits (Telemedicine).  Patients are able to view lab/test results, encounter notes, upcoming appointments, etc.  Non-urgent messages can be sent to your provider as well.   To learn more about what you can do with MyChart, go to [email or web address].    Your next appointment:   1 year(s)  The format for your next appointment:   In Person  Provider:   You may see Deanna Grooms, MD or one of the following Advanced Practice Providers on your designated Care Team:    Truitt Merle, NP  Cecilie Kicks, NP  Kathyrn Drown, NP    Other Instructions      Signed, Deanna Grooms, MD  10/09/2020 8:44 AM    [place]

## 2020-10-08 DIAGNOSIS — Z131 Encounter for screening for diabetes mellitus: Secondary | ICD-10-CM | POA: Diagnosis not present

## 2020-10-08 DIAGNOSIS — Z0000 Encounter for general adult medical examination without abnormal findings: Secondary | ICD-10-CM | POA: Diagnosis not present

## 2020-10-09 ENCOUNTER — Ambulatory Visit: Payer: 59 | Admitting: Interventional Cardiology

## 2020-10-09 ENCOUNTER — Encounter: Payer: Self-pay | Admitting: Interventional Cardiology

## 2020-10-09 ENCOUNTER — Telehealth: Payer: Self-pay | Admitting: *Deleted

## 2020-10-09 ENCOUNTER — Other Ambulatory Visit: Payer: Self-pay

## 2020-10-09 VITALS — BP 124/70 | HR 72 | Ht 65.0 in | Wt 226.0 lb

## 2020-10-09 DIAGNOSIS — I10 Essential (primary) hypertension: Secondary | ICD-10-CM

## 2020-10-09 DIAGNOSIS — E7800 Pure hypercholesterolemia, unspecified: Secondary | ICD-10-CM

## 2020-10-09 DIAGNOSIS — I350 Nonrheumatic aortic (valve) stenosis: Secondary | ICD-10-CM

## 2020-10-09 DIAGNOSIS — R0683 Snoring: Secondary | ICD-10-CM

## 2020-10-09 DIAGNOSIS — R0681 Apnea, not elsewhere classified: Secondary | ICD-10-CM

## 2020-10-09 DIAGNOSIS — I25708 Atherosclerosis of coronary artery bypass graft(s), unspecified, with other forms of angina pectoris: Secondary | ICD-10-CM | POA: Diagnosis not present

## 2020-10-09 DIAGNOSIS — R0989 Other specified symptoms and signs involving the circulatory and respiratory systems: Secondary | ICD-10-CM

## 2020-10-09 DIAGNOSIS — Z7189 Other specified counseling: Secondary | ICD-10-CM | POA: Diagnosis not present

## 2020-10-09 NOTE — Telephone Encounter (Signed)
-----   Message from Loren Racer, RN sent at 10/09/2020  8:50 AM EST ----- Sleep study ordered

## 2020-10-09 NOTE — Patient Instructions (Signed)
Medication Instructions:  Your physician recommends that you continue on your current medications as directed. Please refer to the Current Medication list given to you today.  *If you need a refill on your cardiac medications before your next appointment, please call your pharmacy*   Lab Work: None If you have labs (blood work) drawn today and your tests are completely normal, you will receive your results only by: Marland Kitchen MyChart Message (if you have MyChart) OR . A paper copy in the mail If you have any lab test that is abnormal or we need to change your treatment, we will call you to review the results.   Testing/Procedures: Your physician has requested that you have an echocardiogram. Echocardiography is a painless test that uses sound waves to create images of your heart. It provides your doctor with information about the size and shape of your heart and how well your heart's chambers and valves are working. This procedure takes approximately one hour. There are no restrictions for this procedure.  Your physician has recommended that you have a sleep study. This test records several body functions during sleep, including: brain activity, eye movement, oxygen and carbon dioxide blood levels, heart rate and rhythm, breathing rate and rhythm, the flow of air through your mouth and nose, snoring, body muscle movements, and chest and belly movement.   Follow-Up: At [facility], you and your health needs are our priority.  As part of our continuing mission to provide you with exceptional heart care, we have created designated Provider Care Teams.  These Care Teams include your primary Cardiologist (physician) and Advanced Practice Providers (APPs -  Physician Assistants and Nurse Practitioners) who all work together to provide you with the care you need, when you need it.  We recommend signing up for the patient portal called "MyChart".  Sign up information is provided on this After Visit Summary.   MyChart is used to connect with patients for Virtual Visits (Telemedicine).  Patients are able to view lab/test results, encounter notes, upcoming appointments, etc.  Non-urgent messages can be sent to your provider as well.   To learn more about what you can do with MyChart, go to [email or web address].    Your next appointment:   1 year(s)  The format for your next appointment:   In Person  Provider:   You may see Sinclair Grooms, MD or one of the following Advanced Practice Providers on your designated Care Team:    Truitt Merle, NP  Cecilie Kicks, NP  Kathyrn Drown, NP    Other Instructions

## 2020-10-12 ENCOUNTER — Other Ambulatory Visit: Payer: Self-pay

## 2020-10-12 ENCOUNTER — Ambulatory Visit
Admission: RE | Admit: 2020-10-12 | Discharge: 2020-10-12 | Disposition: A | Discharge status: Home / Self Care | Payer: 59 | Source: Ambulatory Visit | Attending: Obstetrics & Gynecology | Admitting: Obstetrics & Gynecology

## 2020-10-12 DIAGNOSIS — E119 Type 2 diabetes mellitus without complications: Secondary | ICD-10-CM | POA: Diagnosis not present

## 2020-10-12 DIAGNOSIS — M3210 Systemic lupus erythematosus, organ or system involvement unspecified: Secondary | ICD-10-CM | POA: Diagnosis not present

## 2020-10-12 DIAGNOSIS — I2510 Atherosclerotic heart disease of native coronary artery without angina pectoris: Secondary | ICD-10-CM | POA: Diagnosis not present

## 2020-10-12 DIAGNOSIS — Z0001 Encounter for general adult medical examination with abnormal findings: Secondary | ICD-10-CM | POA: Diagnosis not present

## 2020-10-12 DIAGNOSIS — Z1231 Encounter for screening mammogram for malignant neoplasm of breast: Secondary | ICD-10-CM

## 2020-10-12 DIAGNOSIS — D509 Iron deficiency anemia, unspecified: Secondary | ICD-10-CM | POA: Diagnosis not present

## 2020-10-12 DIAGNOSIS — I10 Essential (primary) hypertension: Secondary | ICD-10-CM | POA: Diagnosis not present

## 2020-10-12 DIAGNOSIS — J45909 Unspecified asthma, uncomplicated: Secondary | ICD-10-CM | POA: Diagnosis not present

## 2020-10-12 DIAGNOSIS — E785 Hyperlipidemia, unspecified: Secondary | ICD-10-CM | POA: Diagnosis not present

## 2020-10-20 ENCOUNTER — Encounter: Payer: Self-pay | Admitting: Allergy and Immunology

## 2020-10-20 ENCOUNTER — Other Ambulatory Visit: Payer: Self-pay

## 2020-10-20 ENCOUNTER — Ambulatory Visit: Payer: 59 | Admitting: Allergy and Immunology

## 2020-10-20 VITALS — BP 112/68 | HR 65 | Temp 97.8°F | Resp 14 | Ht 65.0 in | Wt 228.8 lb

## 2020-10-20 DIAGNOSIS — J302 Other seasonal allergic rhinitis: Secondary | ICD-10-CM

## 2020-10-20 DIAGNOSIS — K219 Gastro-esophageal reflux disease without esophagitis: Secondary | ICD-10-CM

## 2020-10-20 DIAGNOSIS — J4540 Moderate persistent asthma, uncomplicated: Secondary | ICD-10-CM | POA: Diagnosis not present

## 2020-10-20 DIAGNOSIS — J3089 Other allergic rhinitis: Secondary | ICD-10-CM | POA: Diagnosis not present

## 2020-10-20 NOTE — Progress Notes (Signed)
[place] [facility] [place] [facility] [place] [facility] [place] [facility] [place]   Follow-up Note  Referring Provider: Marden Noble, MD Primary Provider: Marden Noble, MD Date of Office Visit: 10/20/2020  Subjective:   Deanna Simmons (DOB: September 10, 1951) is a 69 y.o. female who [facility] in re-evaluation of the following:  HPI:  Deanna Simmons returns to this clinic in evaluation of asthma and allergic rhinitis and LPR.  Her last visit to this clinic was 03 Mar 2020.  Overall she has had a very good interval of time without the need for systemic steroid or antibiotic to address any type of airway issue and rare use of a short acting bronchodilator while she continues on Advair mostly 1 time per day and Flonase a few times per week.  She has had very little issues with her reflux as long as she remains on famotidine.  She has received 3 Pfizer Covid vaccine and a flu vaccine this year.  She has having a repeat echocardiogram in evaluation of her aortic stenosis/regurgitation this week.  She has recently had a sleep study performed.  Unfortunately, she did not wear her mandibular advancement device during that sleep study.  Allergies as of 10/20/2020      Reactions   Biaxin [clarithromycin] Itching   Cardiolite [technetium-19m] Itching   Ciprofloxacin Itching   Erythromycin Base Itching   Montelukast Sodium Itching   Niaspan [niacin Er] Other (See Comments)   flushing   Penicillins Itching   Prilosec [omeprazole] Itching   Singulair [montelukast]    Ticlid [ticlopidine Hcl] Itching      Medication List      Advair Diskus 250-50 MCG/DOSE Aepb Generic drug: Fluticasone-Salmeterol Inhale 1 puff into the lungs in the morning and at bedtime.   albuterol 108 (90 Base) MCG/ACT inhaler Commonly known as: Ventolin HFA INHALE 2 PUFFS BY MOUTH EVERY 4-6 HOURS AS NEEDED FOR COUGH OR WHEEZE   aspirin 81 MG tablet Take 81 mg by mouth daily.   cetirizine 10 MG  tablet Commonly known as: ZYRTEC Take 10 mg by mouth daily.   Estradiol 10 MCG Tabs vaginal tablet Place 1 tablet vaginally as needed.   ezetimibe 10 MG tablet Commonly known as: ZETIA TAKE 1 TABLET BY MOUTH DAILY.   famotidine 40 MG tablet Commonly known as: PEPCID Take 1 tablet (40 mg total) by mouth daily.   fluticasone 50 MCG/ACT nasal spray Commonly known as: FLONASE INSTILL 1 TO 2 SPRAYS INTO EACH NOSTRIL DAILY FOR STUFFY NOSE OR DRAINAGE   magnesium gluconate 500 MG tablet Commonly known as: MAGONATE Take 500 mg by mouth as needed.   metFORMIN 500 MG tablet Commonly known as: GLUCOPHAGE Take 500 mg by mouth 2 (two) times a day.   metoprolol tartrate 25 MG tablet Commonly known as: LOPRESSOR Take 1 tablet (25 mg total) by mouth 2 (two) times daily. Please keep upcoming appt in December with Dr. Katrinka Blazing for future refills. Thank you   MUCINEX DM PO Take by mouth as needed. Reported on 03/29/2016   MULTIVITAL PO Take 1 tablet by mouth daily.   nitroGLYCERIN 0.4 MG SL tablet Commonly known as: NITROSTAT Place 0.4 mg under the tongue every 5 (five) minutes as needed for chest pain (3 doses MAX). Reported on 12/01/2015   PROBIOTIC PO Take 1 tablet by mouth daily. Pt takes Restora   Qvar RediHaler 80 MCG/ACT inhaler Generic drug: beclomethasone Use 3 puffs 3 times daily during asthma flares.  rosuvastatin 40 MG tablet Commonly known as: CRESTOR Take 1 tablet (40 mg total) by mouth daily. Please keep upcoming appt in December with Dr. Tamala Julian for future refills. Thank you   sodium chloride 0.65 % Soln nasal spray Commonly known as: OCEAN Place 1 spray into both nostrils as needed for congestion.   True Metrix Blood Glucose Test test strip Generic drug: glucose blood   TRUEplus Lancets 30G Misc   valACYclovir 500 MG tablet Commonly known as: VALTREX Take 500 mg by mouth daily as needed (cold sores/blisters). Reported on 12/01/2015   VITAMIN B-12 PO Take 1  tablet by mouth daily.   Vitamin D3 50 MCG (2000 UT) Tabs Take 1 tablet by mouth daily. With Vitamin K2 in it       Past Medical History:  Diagnosis Date  . Abnormal finding on Pap smear, ASCUS   . Anemia   . Asthma   . Chondromalacia of knee   . Coronary artery disease   . Diabetes mellitus without complication ([place])   . Diverticulum 12/11/2002   in cecum  . Fibroids    h/o  . Glucose intolerance (impaired glucose tolerance)   . H/O elevated lipids   . H/O tinea cruris 02/2010  . History of ASCVD    s/p CABG  . Hypercholesteremia   . Hypertension   . Internal hemorrhoids    h/o  . Irregular menses    h/o  . MI (myocardial infarction) ([place]) age 62  . Obesity   . Pneumonia 07/2008  . Synovitis of knee   . Systemic lupus erythematosus ([place])    with nephritis  . Vulvitis    h/o    Past Surgical History:  Procedure Laterality Date  . CESAREAN SECTION    . CORONARY ARTERY BYPASS GRAFT    . Endometrial curettage  03/29/2000  . ROTATOR CUFF REPAIR    . TUBAL LIGATION    . UPPER GASTROINTESTINAL ENDOSCOPY  12/11/2002    Review of systems negative except as noted in HPI / PMHx or noted below:  Review of Systems  Constitutional: Negative.   HENT: Negative.   Eyes: Negative.   Respiratory: Negative.   Cardiovascular: Negative.   Gastrointestinal: Negative.   Genitourinary: Negative.   Musculoskeletal: Negative.   Skin: Negative.   Neurological: Negative.   Endo/Heme/Allergies: Negative.   Psychiatric/Behavioral: Negative.      Objective:   Vitals:   10/20/20 0908  BP: 112/68  Pulse: 65  Resp: 14  Temp: 97.8 F (36.6 C)  SpO2: 98%   Height: 5\' 5"  (165.1 cm)  Weight: 228 lb 12.8 oz (103.8 kg)   Physical Exam Constitutional:      Appearance: She is not diaphoretic.  HENT:     Head: Normocephalic.     Right Ear: Tympanic membrane, ear canal and external ear normal.     Left Ear: Tympanic membrane, ear canal and external ear normal.     Nose: Nose  normal. No mucosal edema or rhinorrhea.     Mouth/Throat:     Mouth: Oropharynx is clear and moist and mucous membranes are normal.     Pharynx: Uvula midline. No oropharyngeal exudate.  Eyes:     Conjunctiva/sclera: Conjunctivae normal.  Neck:     Thyroid: No thyromegaly.     Trachea: Trachea normal. No tracheal tenderness or tracheal deviation.  Cardiovascular:     Rate and Rhythm: Normal rate and regular rhythm.     Heart sounds: Normal heart sounds, S1 normal  and S2 normal. No murmur (Systolic) heard.   Pulmonary:     Effort: No respiratory distress.     Breath sounds: Normal breath sounds. No stridor. No wheezing or rales.  Musculoskeletal:        General: No edema.  Lymphadenopathy:     Head:     Right side of head: No tonsillar adenopathy.     Left side of head: No tonsillar adenopathy.     Cervical: No cervical adenopathy.  Skin:    Findings: No erythema or rash.     Nails: There is no clubbing.  Neurological:     Mental Status: She is alert.     Diagnostics:    Spirometry was performed and demonstrated an FEV1 of 1.92 at 100 % of predicted.  The patient had an Asthma Control Test with the following results: ACT Total Score: 20.    Assessment and Plan:   1. Asthma, moderate persistent, well-controlled   2. Seasonal and perennial allergic rhinitis   3. LPRD (laryngopharyngeal reflux disease)     1.  Continue Advair 250 1 inhalation 1-2 times per day depending on disease activity  2.  Continue Flonase 1 spray each nostril 3-7 times per week depending on disease activity  3. Continue Famotidine 40 mg tablet 1 time per day  4.  "Action plan" for asthma flare:   A. Add Qvar 80 REDIHALER 3 inhalations 3 times per day  B. use Proventil HFA 2 puffs every 4-6 hours if needed  C. Increase famotidine to 40 mg 2 times per day  5. Zyrtec 10 mg one tablet one time per day if needed  6. Return to clinic in 6 months or earlier if problem  Deanna Simmons appears to be doing  very well from a respiratory standpoint and her reflux appears to be under control as well on her current therapy which includes anti-inflammatory agents for both her upper and lower airway and famotidine on a pretty regular basis.  She has not had to activate her "action plan" since her last visit which is a very good finding.  Hopefully she will do as well as she goes through the spring season.  She is having evaluation for her moderate to severe aortic stenosis with a repeat echocardiogram this week.  She did have a sleep study ordered by her cardiologist but unfortunately that sleep study was performed without her mandibular advancement device and she may need to have that repeated at some point in the near future.  I will see her back in this clinic in 6 months or earlier if there is a problem.  Allena Katz, MD Allergy / Immunology [place]

## 2020-10-20 NOTE — Patient Instructions (Signed)
  1.  Continue Advair 250 1 inhalation 1-2 times per day depending on disease activity  2.  Continue Flonase 1 spray each nostril 3-7 times per week depending on disease activity  3. Continue Famotidine 40 mg tablet 1 time per day  4.  "Action plan" for asthma flare:   A. Add Qvar 80 REDIHALER 3 inhalations 3 times per day  B. use Proventil HFA 2 puffs every 4-6 hours if needed  C. Increase famotidine to 40 mg 2 times per day  5. Zyrtec 10 mg one tablet one time per day if needed  6. Return to clinic in 6 months or earlier if problem

## 2020-10-21 ENCOUNTER — Encounter: Payer: Self-pay | Admitting: Allergy and Immunology

## 2020-10-21 ENCOUNTER — Other Ambulatory Visit: Payer: Self-pay | Admitting: Allergy and Immunology

## 2020-10-21 MED FILL — FAMOTIDINE 40 MG TABS: 40 | 30 days supply | Qty: 60 | Fill #0

## 2020-10-21 NOTE — Telephone Encounter (Signed)
Patient called requesting a refill for Pepcid. Last seen yesterday, 10/20/20.

## 2020-10-22 ENCOUNTER — Other Ambulatory Visit: Payer: Self-pay

## 2020-10-22 ENCOUNTER — Ambulatory Visit (HOSPITAL_COMMUNITY): Payer: 59 | Attending: Internal Medicine

## 2020-10-22 DIAGNOSIS — I350 Nonrheumatic aortic (valve) stenosis: Secondary | ICD-10-CM | POA: Diagnosis not present

## 2020-10-22 LAB — ECHOCARDIOGRAM COMPLETE
AR max vel: 0.84 cm2
AV Area VTI: 0.89 cm2
AV Area mean vel: 0.78 cm2
AV Mean grad: 34 mmHg
AV Peak grad: 56 mmHg
Ao pk vel: 3.74 m/s
Area-P 1/2: 2.24 cm2
P 1/2 time: 397 msec
S' Lateral: 2.2 cm

## 2020-11-04 ENCOUNTER — Other Ambulatory Visit (HOSPITAL_COMMUNITY): Payer: Self-pay | Admitting: Internal Medicine

## 2020-11-05 MED FILL — CICLOPIROX 8 % SOLN: 8 | 90 days supply | Qty: 13 | Fill #0

## 2020-11-25 ENCOUNTER — Other Ambulatory Visit (HOSPITAL_COMMUNITY): Payer: Self-pay | Admitting: Internal Medicine

## 2020-11-25 MED FILL — metFORMIN HCL ER 500 MG TB2: 500 | 90 days supply | Qty: 180 | Fill #2

## 2020-11-25 MED FILL — ROSUVASTATIN CALCIUM 40 MG: 40 | 90 days supply | Qty: 90 | Fill #0

## 2020-11-26 MED FILL — ADVAIR 250/50 DISKUS: 250-50 | 30 days supply | Qty: 60 | Fill #2

## 2020-11-26 MED FILL — FAMOTIDINE 40 MG TABS: 40 | 30 days supply | Qty: 60 | Fill #1

## 2020-12-15 DIAGNOSIS — K219 Gastro-esophageal reflux disease without esophagitis: Secondary | ICD-10-CM | POA: Diagnosis not present

## 2020-12-15 DIAGNOSIS — D509 Iron deficiency anemia, unspecified: Secondary | ICD-10-CM | POA: Diagnosis not present

## 2020-12-15 DIAGNOSIS — K5900 Constipation, unspecified: Secondary | ICD-10-CM | POA: Diagnosis not present

## 2020-12-16 ENCOUNTER — Other Ambulatory Visit (HOSPITAL_COMMUNITY): Payer: Self-pay | Admitting: Internal Medicine

## 2021-01-20 ENCOUNTER — Other Ambulatory Visit (HOSPITAL_COMMUNITY): Payer: Self-pay | Admitting: Internal Medicine

## 2021-01-20 MED FILL — METOPROLOL SUCCINATE ER 25: 25 | 90 days supply | Qty: 180 | Fill #1

## 2021-01-20 MED FILL — FREESTYLE LANCETS: 90 days supply | Qty: 200 | Fill #0

## 2021-01-20 MED FILL — FREESTYLE LITE METER: W/DEVICE | 30 days supply | Qty: 1 | Fill #0

## 2021-01-20 MED FILL — FAMOTIDINE 40 MG TABS: 40 | 30 days supply | Qty: 60 | Fill #2

## 2021-01-20 MED FILL — FREESTYLE LITE TEST STRIP: 90 days supply | Qty: 200 | Fill #0

## 2021-02-18 ENCOUNTER — Other Ambulatory Visit (HOSPITAL_COMMUNITY): Payer: Self-pay

## 2021-02-18 DIAGNOSIS — D631 Anemia in chronic kidney disease: Secondary | ICD-10-CM | POA: Diagnosis not present

## 2021-02-18 DIAGNOSIS — M3214 Glomerular disease in systemic lupus erythematosus: Secondary | ICD-10-CM | POA: Diagnosis not present

## 2021-02-18 DIAGNOSIS — I129 Hypertensive chronic kidney disease with stage 1 through stage 4 chronic kidney disease, or unspecified chronic kidney disease: Secondary | ICD-10-CM | POA: Diagnosis not present

## 2021-02-18 DIAGNOSIS — N189 Chronic kidney disease, unspecified: Secondary | ICD-10-CM | POA: Diagnosis not present

## 2021-02-18 DIAGNOSIS — E119 Type 2 diabetes mellitus without complications: Secondary | ICD-10-CM | POA: Diagnosis not present

## 2021-02-18 MED ORDER — FARXIGA 10 MG PO TABS
10.0000 mg | ORAL_TABLET | Freq: Every day | ORAL | 3 refills | Status: DC
  Filled 2021-02-18: qty 90, 90d supply, fill #0
  Filled 2021-07-07: qty 90, 90d supply, fill #1
  Filled 2021-10-08: qty 90, 90d supply, fill #2
  Filled 2022-01-11: qty 90, 90d supply, fill #3

## 2021-02-23 ENCOUNTER — Other Ambulatory Visit (HOSPITAL_COMMUNITY): Payer: Self-pay

## 2021-03-04 ENCOUNTER — Other Ambulatory Visit (HOSPITAL_COMMUNITY): Payer: Self-pay

## 2021-03-12 DIAGNOSIS — K5903 Drug induced constipation: Secondary | ICD-10-CM | POA: Diagnosis not present

## 2021-03-15 ENCOUNTER — Other Ambulatory Visit: Payer: Self-pay | Admitting: Interventional Cardiology

## 2021-03-15 ENCOUNTER — Other Ambulatory Visit (HOSPITAL_COMMUNITY): Payer: Self-pay

## 2021-03-15 MED ORDER — EZETIMIBE 10 MG PO TABS
10.0000 mg | ORAL_TABLET | Freq: Every day | ORAL | 0 refills | Status: DC
  Filled 2021-03-15: qty 90, 90d supply, fill #0

## 2021-03-18 ENCOUNTER — Other Ambulatory Visit (HOSPITAL_COMMUNITY): Payer: Self-pay

## 2021-04-03 ENCOUNTER — Other Ambulatory Visit (HOSPITAL_COMMUNITY): Payer: Self-pay

## 2021-04-03 MED FILL — Rosuvastatin Calcium Tab 40 MG: ORAL | 90 days supply | Qty: 90 | Fill #0 | Status: AC

## 2021-04-03 MED FILL — Famotidine Tab 40 MG: ORAL | 30 days supply | Qty: 60 | Fill #0 | Status: AC

## 2021-04-13 ENCOUNTER — Emergency Department (HOSPITAL_COMMUNITY): Payer: 59

## 2021-04-13 ENCOUNTER — Emergency Department (HOSPITAL_COMMUNITY)
Admission: EM | Admit: 2021-04-13 | Discharge: 2021-04-13 | Disposition: A | Discharge status: Home / Self Care | Payer: 59 | Attending: Emergency Medicine | Admitting: Emergency Medicine

## 2021-04-13 ENCOUNTER — Other Ambulatory Visit: Payer: Self-pay

## 2021-04-13 ENCOUNTER — Encounter (HOSPITAL_COMMUNITY): Payer: Self-pay

## 2021-04-13 DIAGNOSIS — E119 Type 2 diabetes mellitus without complications: Secondary | ICD-10-CM | POA: Insufficient documentation

## 2021-04-13 DIAGNOSIS — D509 Iron deficiency anemia, unspecified: Secondary | ICD-10-CM | POA: Diagnosis not present

## 2021-04-13 DIAGNOSIS — R42 Dizziness and giddiness: Secondary | ICD-10-CM | POA: Diagnosis not present

## 2021-04-13 DIAGNOSIS — I10 Essential (primary) hypertension: Secondary | ICD-10-CM | POA: Insufficient documentation

## 2021-04-13 DIAGNOSIS — Z951 Presence of aortocoronary bypass graft: Secondary | ICD-10-CM | POA: Diagnosis not present

## 2021-04-13 DIAGNOSIS — Z7984 Long term (current) use of oral hypoglycemic drugs: Secondary | ICD-10-CM | POA: Insufficient documentation

## 2021-04-13 DIAGNOSIS — Z7951 Long term (current) use of inhaled steroids: Secondary | ICD-10-CM | POA: Insufficient documentation

## 2021-04-13 DIAGNOSIS — Z87891 Personal history of nicotine dependence: Secondary | ICD-10-CM | POA: Diagnosis not present

## 2021-04-13 DIAGNOSIS — Z7982 Long term (current) use of aspirin: Secondary | ICD-10-CM | POA: Insufficient documentation

## 2021-04-13 DIAGNOSIS — J45909 Unspecified asthma, uncomplicated: Secondary | ICD-10-CM | POA: Insufficient documentation

## 2021-04-13 DIAGNOSIS — R269 Unspecified abnormalities of gait and mobility: Secondary | ICD-10-CM

## 2021-04-13 DIAGNOSIS — R2689 Other abnormalities of gait and mobility: Secondary | ICD-10-CM | POA: Diagnosis not present

## 2021-04-13 DIAGNOSIS — Z8673 Personal history of transient ischemic attack (TIA), and cerebral infarction without residual deficits: Secondary | ICD-10-CM | POA: Diagnosis not present

## 2021-04-13 DIAGNOSIS — Z79899 Other long term (current) drug therapy: Secondary | ICD-10-CM | POA: Diagnosis not present

## 2021-04-13 DIAGNOSIS — E785 Hyperlipidemia, unspecified: Secondary | ICD-10-CM | POA: Diagnosis not present

## 2021-04-13 DIAGNOSIS — I2510 Atherosclerotic heart disease of native coronary artery without angina pectoris: Secondary | ICD-10-CM | POA: Insufficient documentation

## 2021-04-13 DIAGNOSIS — G4733 Obstructive sleep apnea (adult) (pediatric): Secondary | ICD-10-CM | POA: Diagnosis not present

## 2021-04-13 DIAGNOSIS — I350 Nonrheumatic aortic (valve) stenosis: Secondary | ICD-10-CM | POA: Diagnosis not present

## 2021-04-13 DIAGNOSIS — K5903 Drug induced constipation: Secondary | ICD-10-CM | POA: Diagnosis not present

## 2021-04-13 DIAGNOSIS — M3210 Systemic lupus erythematosus, organ or system involvement unspecified: Secondary | ICD-10-CM | POA: Diagnosis not present

## 2021-04-13 LAB — CBC WITH DIFFERENTIAL/PLATELET
Abs Immature Granulocytes: 0.02 10*3/uL (ref 0.00–0.07)
Basophils Absolute: 0 10*3/uL (ref 0.0–0.1)
Basophils Relative: 1 %
Eosinophils Absolute: 0.2 10*3/uL (ref 0.0–0.5)
Eosinophils Relative: 3 %
HCT: 37.5 % (ref 36.0–46.0)
Hemoglobin: 11.6 g/dL — ABNORMAL LOW (ref 12.0–15.0)
Immature Granulocytes: 0 %
Lymphocytes Relative: 37 %
Lymphs Abs: 2.6 10*3/uL (ref 0.7–4.0)
MCH: 28 pg (ref 26.0–34.0)
MCHC: 30.9 g/dL (ref 30.0–36.0)
MCV: 90.4 fL (ref 80.0–100.0)
Monocytes Absolute: 0.6 10*3/uL (ref 0.1–1.0)
Monocytes Relative: 9 %
Neutro Abs: 3.7 10*3/uL (ref 1.7–7.7)
Neutrophils Relative %: 50 %
Platelets: 229 10*3/uL (ref 150–400)
RBC: 4.15 MIL/uL (ref 3.87–5.11)
RDW: 12.6 % (ref 11.5–15.5)
WBC: 7.1 10*3/uL (ref 4.0–10.5)
nRBC: 0 % (ref 0.0–0.2)

## 2021-04-13 LAB — COMPREHENSIVE METABOLIC PANEL
ALT: 25 U/L (ref 0–44)
AST: 29 U/L (ref 15–41)
Albumin: 3.7 g/dL (ref 3.5–5.0)
Alkaline Phosphatase: 78 U/L (ref 38–126)
Anion gap: 9 (ref 5–15)
BUN: 10 mg/dL (ref 8–23)
CO2: 26 mmol/L (ref 22–32)
Calcium: 9.3 mg/dL (ref 8.9–10.3)
Chloride: 104 mmol/L (ref 98–111)
Creatinine, Ser: 0.73 mg/dL (ref 0.44–1.00)
GFR, Estimated: >60 mL/min (ref >60)
Glucose, Bld: 155 mg/dL — ABNORMAL HIGH (ref 70–99)
Potassium: 4.5 mmol/L (ref 3.5–5.1)
Sodium: 139 mmol/L (ref 135–145)
Total Bilirubin: 0.4 mg/dL (ref 0.3–1.2)
Total Protein: 7.2 g/dL (ref 6.5–8.1)

## 2021-04-13 LAB — PROTIME-INR
INR: 1 (ref 0.8–1.2)
Prothrombin Time: 13.5 seconds (ref 11.4–15.2)

## 2021-04-13 MED ORDER — LORAZEPAM 1 MG PO TABS
0.5000 mg | ORAL_TABLET | Freq: Two times a day (BID) | ORAL | Status: DC | PRN
  Administered 2021-04-13: 0.5 mg via ORAL
  Filled 2021-04-13: qty 1

## 2021-04-13 NOTE — ED Triage Notes (Signed)
Pt reports she began walking to he Right side while waking down the hallway approx 1 hour ago. Reports feeling "tired" x1 week, since spouse was admitted to the hospital. Pt states she is working and taking care of her spouse as well. Pt seen at PCP today for her check up. No neuro deficits noted. Symptoms have resolved.

## 2021-04-13 NOTE — ED Provider Notes (Signed)
Emergency Medicine Provider Triage Evaluation Note  Deanna Simmons , a 70 y.o. female  was evaluated in triage.  Pt complains of difficulty walking. Pt getting off elevator and had trouble walking.  Pt reports she could not walk straight   Review of Systems  Positive: Weakness  Negative: No fever no cough   Physical Exam  BP (!) 143/67 (BP Location: Right Arm)   Pulse 75   Temp 98.5 F (36.9 C)   Resp 16   SpO2 100%  Gen:   Awake, no distress   Resp:  Normal effort  MSK:   Moves extremities without difficulty  Other:    Medical Decision Making  Medically screening exam initiated at 4:21 PM.  Appropriate orders placed.  Deanna Simmons was informed that the remainder of the evaluation will be completed by another provider, this initial triage assessment does not replace that evaluation, and the importance of remaining in the ED until their evaluation is complete.     Deanna Simmons, [place] 04/13/21 1627    Deanna Dessert, MD 04/16/21 1351

## 2021-04-13 NOTE — ED Notes (Signed)
Patient transported to MRI 

## 2021-04-13 NOTE — Discharge Instructions (Addendum)
Please follow-up with a neurologist as an outpatient.  I given you the information for [facility] neurology but you may also follow-up with [facility] neurology or any other outpatient neurology clinic.  Your MRI today showed no acute stroke.  Please continue take your medications as prescribed.  Please follow-up with your primary care provider as well.  Return to the ER for any new or concerning symptoms.  As we discussed after a transient ischemic attack there is an increased risk of a stroke.  If you begin to have any symptoms concerning for a stroke such as facial drooping, weakness, numbness or any other new or concerning symptoms come to the ER.

## 2021-04-13 NOTE — ED Notes (Signed)
Returned from MRI 

## 2021-04-13 NOTE — ED Provider Notes (Addendum)
[facility] Provider Note   CSN: [id number] Arrival date & time: 04/13/21  1533     History Chief Complaint  Patient presents with   Gait Problem    Deanna Simmons is a 70 y.o. female.  HPI Patient is a 70 year old female with a past medical history significant for DM 2, anemia, asthma, CAD s/p CABG, HTN, obesity, SLE  Patient is here today for evaluation of difficulty ambulating.  She states that it has been a long week for her and she has been stressed because she has a family member who was in the hospital she states that she attributes some of the weakness that she has had recently to this.  She states that she was seen by her primary care provider this morning and had lab work done in the office.  She states that she works here at [employer] and was taking a elevator upstairs and felt totally fine however she walked out of the elevator and noticed that she was walking to the right.  She states that she try to correct this but felt that she was persistently little team to the right.  She states she did not have any vertigo and did not feel that she had any focal weakness in her leg arm and denies any slurred speech confusion vision changes headache.  Patient states that her symptoms lasted for a grand total of 5 minutes.  She states certainly last less than 10 minutes.  Denies any weakness numbness slurred speech confusion or any other symptoms presently.  States admitted for symptoms are present earlier today either.     Past Medical History:  Diagnosis Date   Abnormal finding on Pap smear, ASCUS    Anemia    Asthma    Chondromalacia of knee    Coronary artery disease    Diabetes mellitus without complication ([place])    Diverticulum 12/11/2002   in cecum   Fibroids    h/o   Glucose intolerance (impaired glucose tolerance)    H/O elevated lipids    H/O tinea cruris 02/2010   History of ASCVD    s/p CABG   Hypercholesteremia     Hypertension    Internal hemorrhoids    h/o   Irregular menses    h/o   MI (myocardial infarction) ([place]) age 51   Obesity    Pneumonia 07/2008   Synovitis of knee    Systemic lupus erythematosus (H[facility])    with nephritis   Vulvitis    h/o    Patient Active Problem List   Diagnosis Date Noted   Seasonal and perennial allergic rhinitis 03/07/2019   LPRD (laryngopharyngeal reflux disease) 03/07/2019   Asthma, well controlled 12/11/2018   Menopausal symptom 12/11/2018   Pain of breast 12/11/2018   Type 2 diabetes mellitus ([place]) 12/11/2018   Hammer toe of right foot 06/28/2018   Corns and callosities 06/28/2018   Bunion 06/28/2018   Chronic pain of left knee 09/06/2016   Right carotid bruit 08/03/2015   Moderate aortic stenosis 07/18/2014   CAD (coronary artery disease) of artery bypass graft    Hypercholesteremia    Obesity    Anemia    Chondromalacia of knee    Synovitis of knee    Hypertension    Fibroids    Internal hemorrhoids    Diverticulum    Vulvitis    Old MI (myocardial infarction)     Past Surgical History:  Procedure Laterality Date  CESAREAN SECTION     CORONARY ARTERY BYPASS GRAFT     Endometrial curettage  03/29/2000   ROTATOR CUFF REPAIR     TUBAL LIGATION     UPPER GASTROINTESTINAL ENDOSCOPY  12/11/2002     OB History     Gravida  3   Para  3   Term      Preterm      AB      Living  4      SAB      IAB      Ectopic      Multiple  1   Live Births              Family History  Problem Relation Age of Onset   Diabetes Mother    Stroke Mother    Breast cancer Mother 21   Prostate cancer Father    Diabetes Father    Hyperlipidemia Father    Heart attack Paternal Uncle        2 paternal uncles   Ovarian cancer Cousin     Social History   Tobacco Use   Smoking status: Former    Pack years: 0.00   Smokeless tobacco: Never  Substance Use Topics   Alcohol use: Yes    Comment: occasional   Drug use: No     Home Medications Prior to Admission medications   Medication Sig Start Date End Date Taking? Authorizing Provider  ADVAIR DISKUS 250-50 MCG/DOSE AEPB INHALE 1 PUFF INTO THE LUNGS IN THE MORNING AND AT BEDTIME. 06/09/20 06/09/21  Kozlow, Donnamarie Poag, MD  albuterol (VENTOLIN HFA) 108 (90 Base) MCG/ACT inhaler INHALE 2 PUFFS BY MOUTH EVERY 4-6 HOURS AS NEEDED FOR COUGH OR WHEEZE 06/13/19   Kozlow, Donnamarie Poag, MD  aspirin 81 MG tablet Take 81 mg by mouth daily.    [provider]  beclomethasone (QVAR REDIHALER) 80 MCG/ACT inhaler Use 3 puffs 3 times daily during asthma flares. 06/23/20   Kozlow, Donnamarie Poag, MD  Blood Glucose Monitoring Suppl (FREESTYLE LITE) w/Device KIT USE TO TEST TWO TIMES DAILY PRIOR TO MEALS 01/20/21 01/20/22  Josetta Huddle, MD  cetirizine (ZYRTEC) 10 MG tablet Take 10 mg by mouth daily.    [provider]  Cholecalciferol (VITAMIN D3) 2000 UNITS TABS Take 1 tablet by mouth daily. With Vitamin K2 in it    [provider]  ciclopirox (PENLAC) 8 % solution APPLY TO THE AFFECTED AREA(S) AT BEDTIME AND EVERY 7 DAYS CLEAN NAILS WITH ALCOHOL 11/04/20 11/04/21  Josetta Huddle, MD  Cyanocobalamin (VITAMIN B-12 PO) Take 1 tablet by mouth daily.    [provider]  dapagliflozin propanediol (FARXIGA) 10 MG TABS tablet Take 1 tablet by mouth once a day 02/18/21     Dextromethorphan-Guaifenesin (Stewart DM PO) Take by mouth as needed. Reported on 03/29/2016    [provider]  Estradiol 10 MCG TABS vaginal tablet Place 1 tablet vaginally as needed. 09/30/20   [provider]  Estradiol 10 MCG TABS vaginal tablet INSERT 1 TABLET TWICE A WEEK VAGINALLY AS DIRECTED 09/30/20 09/30/21  Waymon Amato, MD  ezetimibe (ZETIA) 10 MG tablet Take 1 tablet (10 mg total) by mouth daily. Pt must keep upcoming appt in July for further refills 03/15/21   Belva Crome, MD  famotidine (PEPCID) 40 MG tablet TAKE ONE TABLET BY MOUTH ONCE DAILY. INCREASE TO ONE TABLET TWICE DAILY  DURING FLARE-UP. 10/21/20 10/21/21  Kozlow, Donnamarie Poag, MD  fluticasone (FLONASE) 50 MCG/ACT  nasal spray INSTILL 1 TO 2 SPRAYS INTO EACH NOSTRIL DAILY FOR STUFFY NOSE OR DRAINAGE 11/12/18   Kozlow, Donnamarie Poag, MD  glucose blood test strip USE TO TEST TWICE A DAY BEFORE MEALS 01/20/21 01/20/22  Josetta Huddle, MD  glucose blood test strip USE WHEN CHECKING BLOOD SUGAR TWICE DAILY PRIOR TO MEALS 12/16/20 12/16/21  Josetta Huddle, MD  Lancets (FREESTYLE) lancets USE TO TEST TWO TIMES DAILY PRIOR TO MEALS 01/20/21 01/20/22  Josetta Huddle, MD  magnesium gluconate (MAGONATE) 500 MG tablet Take 500 mg by mouth as needed.    [provider]  metFORMIN (GLUCOPHAGE) 500 MG tablet Take 500 mg by mouth 2 (two) times a day.     [provider]  metFORMIN (GLUCOPHAGE-XR) 500 MG 24 hr tablet TAKE 1 TABLET BY MOUTH TWICE DAILY WITH A MEAL 05/25/20 05/25/21  Josetta Huddle, MD  metoprolol succinate (TOPROL-XL) 25 MG 24 hr tablet TAKE 1 TABLET BY MOUTH TWICE DAILY 09/28/20 09/28/21  Josetta Huddle, MD  metoprolol tartrate (LOPRESSOR) 25 MG tablet Take 1 tablet (25 mg total) by mouth 2 (two) times daily. Please keep upcoming appt in December with Dr. Tamala Julian for future refills. Thank you 09/24/19   Belva Crome, MD  Multiple Vitamins-Minerals (MULTIVITAL PO) Take 1 tablet by mouth daily.    [provider]  nitroGLYCERIN (NITROSTAT) 0.4 MG SL tablet Place 0.4 mg under the tongue every 5 (five) minutes as needed for chest pain (3 doses MAX). Reported on 12/01/2015    [provider]  Probiotic Product (PROBIOTIC PO) Take 1 tablet by mouth daily. Pt takes Dealer, Historical, MD  rosuvastatin (CRESTOR) 40 MG tablet Take 1 tablet (40 mg total) by mouth daily. Please keep upcoming appt in December with Dr. Tamala Julian for future refills. Thank you 09/24/19   Belva Crome, MD  rosuvastatin (CRESTOR) 40 MG tablet TAKE 1 TABLET BY MOUTH ONCE A DAY 11/25/20 11/25/21  Josetta Huddle, MD  sodium chloride  (OCEAN) 0.65 % SOLN nasal spray Place 1 spray into both nostrils as needed for congestion.    [provider]  TRUE METRIX BLOOD GLUCOSE TEST test strip  12/25/15   [provider]  TRUEPLUS LANCETS 30G MISC  12/25/15   [provider]  valACYclovir (VALTREX) 500 MG tablet Take 500 mg by mouth daily as needed (cold sores/blisters). Reported on 12/01/2015    [provider]    Allergies    Biaxin [clarithromycin], Cardiolite [technetium-41m, Ciprofloxacin, Erythromycin base, Montelukast sodium, Niaspan [niacin er], Penicillins, Prilosec [omeprazole], Singulair [montelukast], and Ticlid [ticlopidine hcl]  Review of Systems   Review of Systems  Constitutional:  Negative for chills and fever.  HENT:  Negative for congestion.   Eyes:  Negative for pain.  Respiratory:  Negative for cough and shortness of breath.   Cardiovascular:  Negative for chest pain and leg swelling.  Gastrointestinal:  Negative for abdominal pain and vomiting.  Genitourinary:  Negative for dysuria.  Musculoskeletal:  Negative for myalgias.  Skin:  Negative for rash.  Neurological:  Negative for dizziness and headaches.       Off balance   Physical Exam Updated Vital Signs BP (!) 133/101   Pulse 69   Temp 98.5 F (36.9 C)   Resp 16   SpO2 99%   Physical Exam Vitals and nursing note reviewed.  Constitutional:      General: She is not in acute distress.    Comments: Pleasant well-appearing 70year old.  In no  acute distress.  Sitting comfortably in bed.  Able answer questions appropriately follow commands. No increased work of breathing. Speaking in full sentences.  HENT:     Head: Normocephalic and atraumatic.     Nose: Nose normal.  Eyes:     General: No scleral icterus. Cardiovascular:     Rate and Rhythm: Normal rate and regular rhythm.     Pulses: Normal pulses.     Heart sounds: Normal heart sounds.  Pulmonary:     Effort: Pulmonary effort is normal. No respiratory  distress.     Breath sounds: No wheezing.  Abdominal:     Palpations: Abdomen is soft.     Tenderness: There is no abdominal tenderness.  Musculoskeletal:     Cervical back: Normal range of motion.     Right lower leg: No edema.     Left lower leg: No edema.  Skin:    General: Skin is warm and dry.     Capillary Refill: Capillary refill takes less than 2 seconds.  Neurological:     Mental Status: She is alert. Mental status is at baseline.     Comments: Alert and oriented to self, place, time and event.   Speech is fluent, clear without dysarthria or dysphasia.   Strength 5/5 in upper/lower extremities   Sensation intact in upper/lower extremities   Normal gait over approximately 20 ft walk Normal finger-to-nose and feet tapping.  CN I not tested  CN II grossly intact visual fields bilaterally. Did not visualize posterior eye.  CN III, IV, VI PERRLA and EOMs intact bilaterally  CN V Intact sensation to sharp and light touch to the face  CN VII facial movements symmetric  CN VIII not tested  CN IX, X no uvula deviation, symmetric rise of soft palate  CN XI 5/5 SCM and trapezius strength bilaterally  CN XII Midline tongue protrusion, symmetric L/R movements     Psychiatric:        Mood and Affect: Mood normal.        Behavior: Behavior normal.    ED Results / Procedures / Treatments   Labs (all labs ordered are listed, but only abnormal results are displayed) Labs Reviewed  CBC WITH DIFFERENTIAL/PLATELET - Abnormal; Notable for the following components:      Result Value   Hemoglobin 11.6 (*)    All other components within normal limits  COMPREHENSIVE METABOLIC PANEL - Abnormal; Notable for the following components:   Glucose, Bld 155 (*)    All other components within normal limits  PROTIME-INR  URINALYSIS, ROUTINE W REFLEX MICROSCOPIC    EKG None  Radiology CT Head Wo Contrast  Result Date: 04/13/2021 CLINICAL DATA:  Dizziness. EXAM: CT HEAD WITHOUT  CONTRAST TECHNIQUE: Contiguous axial images were obtained from the base of the skull through the vertex without intravenous contrast. COMPARISON:  None. FINDINGS: Brain: No evidence of acute large vascular territory infarction, hemorrhage, hydrocephalus, extra-axial collection or mass lesion/mass effect. Remote appearing infarct in the right basal ganglia. Vascular: No hyperdense vessel identified. Skull: No acute fracture. Sinuses/Orbits: Visualized sinuses are clear.  Unremarkable orbits. Other: No mastoid effusions. IMPRESSION: 1. No evidence of acute large vascular territory infarct or acute hemorrhage. 2. Remote appearing lacunar infarct in the right basal ganglia. An MRI could provide more sensitive evaluation for acute infarct if clinically indicated. Electronically Signed   By: Margaretha Sheffield MD   On: 04/13/2021 17:57   MR BRAIN WO CONTRAST  Result Date: 04/13/2021 CLINICAL DATA:  Transient ischemic attack EXAM: MRI HEAD WITHOUT CONTRAST TECHNIQUE: Multiplanar, multiecho pulse sequences of the brain and surrounding structures were obtained without intravenous contrast. COMPARISON:  None. FINDINGS: Brain: No acute infarct, mass effect or extra-axial collection. No acute or chronic hemorrhage. Normal white matter signal, parenchymal volume and CSF spaces. Multiple chronic small vessel infarcts of the caudate nuclei. The midline structures are normal. Vascular: Major flow voids are preserved. Skull and upper cervical spine: Normal calvarium and skull base. Visualized upper cervical spine and soft tissues are normal. Sinuses/Orbits:No paranasal sinus fluid levels or advanced mucosal thickening. No mastoid or middle ear effusion. Normal orbits. IMPRESSION: 1. No acute intracranial abnormality. 2. Multiple chronic small vessel infarcts of the caudate nuclei. Electronically Signed   By: Ulyses Jarred M.D.   On: 04/13/2021 20:42    Procedures Procedures   Medications Ordered in ED Medications   LORazepam (ATIVAN) tablet 0.5 mg (0.5 mg Oral Given 04/13/21 2008)    ED Course  I have reviewed the triage vital signs and the nursing notes.  Pertinent labs & imaging results that were available during my care of the patient were reviewed by me and considered in my medical decision making (see chart for details).    MDM Rules/Calculators/A&P                          Patient is a 70 year old female presented today with symptoms of walking and a curved line to the right.  Seem to the to be unrelated to any knee pain the patient has as it is her left knee that seems to chronically bother her.  Physical exam is unremarkable her symptoms completely resolved.  Her symptoms lasted for 5 minutes.  CBC CMP unremarkable.  INR unremarkable. CT head done in triage shows no evidence of acute large vessel infarct although there is a remote lacunar appearing infarct of the right basal ganglia.   Discussed with Dr. Malen Gauze of neurology.  Recommended brain MRI.  MRI without any acute intracranial abnormality.  There is some evidence of multiple chronic small vessel infarcts of the caudate nuclear please are chronic appearing in nature.  Discussed with Dr. Malen Gauze ABCD 2 score of 3 which is relatively low risk.  Patient will follow-up with outpatient neurology I recommended to go for [facility].  Patient is overall well-appearing.  Possibly TIA.  We will follow-up with neurology.  She is agreeable to plan.  Final Clinical Impression(s) / ED Diagnoses Final diagnoses:  Gait disturbance   I discussed this case with my attending physician who cosigned this note including patient's presenting symptoms, physical exam, and planned diagnostics and interventions. Attending physician stated agreement with plan or made changes to plan which were implemented.   Rx / DC Orders ED Discharge Orders     None        Tedd Sias, [place] 04/13/21 2313    Pati Gallo [place], [place] 04/13/21 2314    Dorie Rank, MD 04/14/21 [phone]

## 2021-04-14 ENCOUNTER — Telehealth: Payer: Self-pay | Admitting: Interventional Cardiology

## 2021-04-14 NOTE — Telephone Encounter (Signed)
Spoke with pt's daughter who reports pt has experienced TIA per ED.  Pt remains weak.  She has an appointment scheduled with neuro on 04/30/2021 at 1250pm and Dr Tamala Julian on 05/21/2021.  Encouraged pt's daughter to also schedule an ED f/u visit with pt's PCP as soon as possible.  Stressed importance of keeping f/u appointments.  Continue current medications as prescribed and reviewed ED precautions.  Will forward information to make Dr Tamala Julian an RN aware.  Pt's daughter verbalizes understanding and agrees with current plan.

## 2021-04-14 NOTE — Telephone Encounter (Signed)
STAT if patient feels like he/she is going to faint   Are you dizzy now? No   Do you feel faint or have you passed out? Patient has not passed out, but she became faint yesterday   Do you have any other symptoms? Weakness   Have you checked your HR and BP (record if available)?   06/14:  163/90        130/110        143/100  Patient's daughter states yesterday around 2:00 PM the patient was feeling fine and then she got up to walk and had an episode of dizziness that lasted about 3-5 minutes. She states the patient was unable to walk straight and needed assistance to sit back down. She went to the ER and had a CT and MRI. Per daughter, the MRI was normal, but the CT showed a possible TIA. She states the patient will follow up with neurology for workup.

## 2021-04-19 DIAGNOSIS — E785 Hyperlipidemia, unspecified: Secondary | ICD-10-CM | POA: Diagnosis not present

## 2021-04-19 DIAGNOSIS — D509 Iron deficiency anemia, unspecified: Secondary | ICD-10-CM | POA: Diagnosis not present

## 2021-04-19 DIAGNOSIS — R42 Dizziness and giddiness: Secondary | ICD-10-CM | POA: Diagnosis not present

## 2021-04-19 DIAGNOSIS — E119 Type 2 diabetes mellitus without complications: Secondary | ICD-10-CM | POA: Diagnosis not present

## 2021-04-19 DIAGNOSIS — I2510 Atherosclerotic heart disease of native coronary artery without angina pectoris: Secondary | ICD-10-CM | POA: Diagnosis not present

## 2021-04-19 DIAGNOSIS — I350 Nonrheumatic aortic (valve) stenosis: Secondary | ICD-10-CM | POA: Diagnosis not present

## 2021-04-19 DIAGNOSIS — Z7984 Long term (current) use of oral hypoglycemic drugs: Secondary | ICD-10-CM | POA: Diagnosis not present

## 2021-04-20 ENCOUNTER — Ambulatory Visit: Payer: 59 | Admitting: Allergy and Immunology

## 2021-04-20 ENCOUNTER — Other Ambulatory Visit (HOSPITAL_COMMUNITY): Payer: Self-pay

## 2021-04-20 DIAGNOSIS — J309 Allergic rhinitis, unspecified: Secondary | ICD-10-CM

## 2021-04-21 ENCOUNTER — Other Ambulatory Visit (HOSPITAL_COMMUNITY): Payer: Self-pay

## 2021-04-21 MED ORDER — ALPRAZOLAM 0.25 MG PO TABS
0.2500 mg | ORAL_TABLET | Freq: Two times a day (BID) | ORAL | 1 refills | Status: DC | PRN
  Filled 2021-04-21: qty 30, 15d supply, fill #0

## 2021-04-22 ENCOUNTER — Telehealth: Payer: Self-pay | Admitting: Interventional Cardiology

## 2021-04-22 NOTE — Telephone Encounter (Signed)
New Message:   Daughter is calling to make sure that Dr Tamala Julian is aware that pt is still  having episodes and dizziness and lightheadedness. She is also very weak. She said the pt's primary doctor took her off of her Ruthe Mannan, wonder if pt might need to start back taking it. She wants to see after Dr Tamala Julian reviews all  of this that is going on with  the pt, does he think pt should be seen before her appt on 05-21-21?   STAT if patient feels like he/she is going to faint   Are you dizzy now? Not at this exact time, but earlier this week  Do you feel faint or have you passed out? Lightheaded when the dizziness happen  Do you have any other symptoms? A little confusion and weak  Have you checked your HR and BP (record if available)?  Heart rate have been good, blood pressure have been elevated- can see some readings from her ER visit on 04-13-21

## 2021-04-22 NOTE — Telephone Encounter (Signed)
She needs to get into see neurology as soon as possible.  As she made an appointment?

## 2021-04-22 NOTE — Telephone Encounter (Signed)
Called patient's daughter back about her message. Patient went to the ED last week and had a possible TIA and has appointment with neuro on 04/30/21. Patient is complaining of dizziness, weakness and elevated BP. Patient saw her PCP Monday and he stopped her farxiga, since that was the only change in the last couple of months. Patient has not seen any improvement in her symptoms since seeing her PCP on Monday. Patient's daughter stated that the hospital told her that patient is more at risk of having a stroke in the next couple of weeks after having a TIA. Patient's daughter is concerned about this and patient's heart history and wants to know if Dr. Tamala Julian needs to see patient before 05/21/21 office visit. At this time, Dr. Tamala Julian has no available appointments before her scheduled office visit. Will forward to Dr. Tamala Julian for advisement.

## 2021-04-23 NOTE — Telephone Encounter (Signed)
Patient has an appointment on 04/30/21 with Dr. Tomi Likens.

## 2021-04-26 NOTE — Telephone Encounter (Signed)
Spoke with daughter and made her aware Dr. Tamala Julian said to see Neuro ASAP.  Pt scheduled to see Neuro on Friday.  Daughter appreciative for call.

## 2021-04-29 NOTE — Progress Notes (Signed)
NEUROLOGY CONSULTATION NOTE  Deanna Simmons MRN: [id number] DOB: [birth year]  Referring provider: Dorie Rank, MD (ED referral) Primary care provider: Josetta Huddle, MD  Reason for consult:  TIA  Assessment/Plan:    Possible cerebellar transient ischemic attack  Hypertension  Hyperlipidemia  Type 2 diabetes mellitus  Coronary artery disease  Heart murmur  1.Continue ASA 23m BID for another week, then decrease back to once a day 2.Statin therapy.  LDL goal less than 70 3. Glycemic control.  Hgb A1c goal less than 7 4. CTA of head and neck 5. Complete echocardiogram 6. 14 day event monitor 7.  Will obtain recent labs (Hgb A1c, lipid panel) from PCP's office. 8.  Follow up 6 months.   Subjective:  Deanna FERGis a 70year old right-handed female with CAD, DM II, HTN, HLD, SLE and history of right retinal artery occlusion with residual cloudy vision who presents for TIA.  History supplemented by ED note.  Accompanied by her daughter via phone.  Head was swimmy, walking crooked - went to PCP whoe stopped 8 and increased ASA to twice a day and iron pill  okay after a few days.    [facility]  On 04/13/2021 when she walked out of the elevator and noticed that she was walking veering to the right.  No associated dizziness, unilateral numbness or weakness, slurred speech, visual disturbance, or headache.  Symptom lasted about 5 minutes.  However, she then started to feel "swimmy headed".  She went down to the ED.  Neurologic exam reportedly unremarkable.  CT head and MRI of brain personally reviewed showed chronic small vessel ischemic changes with remote small lacunar infarcts of the right basal ganglia but no acute abnormalities.  Due to low ABCD 2 score and resolution of symptoms, she was discharged with outpatient neurology follow-up.  She followed up with her PCP who felt symptoms were related to FIranand switched her back to metformin and started her on  iron supplement.  He had her take ASA twice daily.  She didn't feel quite right for a few more days and then symptoms completely resolved.  Last echocardiogram from 10/22/2020 showed EF 65-70% with no IAS.   Current medications:  ASA 851mBID, metformin, Lopressor, Crestor  PAST MEDICAL HISTORY: Past Medical History:  Diagnosis Date   Abnormal finding on Pap smear, ASCUS    Anemia    Asthma    Chondromalacia of knee    Coronary artery disease    Diabetes mellitus without complication ([facility]   Diverticulum 12/11/2002   in cecum   Fibroids    h/o   Glucose intolerance (impaired glucose tolerance)    H/O elevated lipids    H/O tinea cruris 02/2010   History of ASCVD    s/p CABG   Hypercholesteremia    Hypertension    Internal hemorrhoids    h/o   Irregular menses    h/o   [place] infarction) [place] Ob[place]    Pneumonia 07/2008   Synovitis of knee    Systemic lupus erythematosus ([place]   with nephritis   Vulvitis    h/o    PAST SURGICAL HISTORY: Past Surgical History:  Procedure Laterality Date   CESAREAN SECTION     CORONARY ARTERY BYPASS GRAFT     Endometrial curettage  03/29/2000   ROTATOR CUFF REPAIR     TUBAL LIGATION     UPPER GASTROINTESTINAL ENDOSCOPY  12/11/2002    MEDICATIONS: Current Outpatient Medications on File Prior to Visit  Medication Sig Dispense Refill   ADVAIR DISKUS 250-50 MCG/DOSE AEPB INHALE 1 PUFF INTO THE LUNGS IN THE MORNING AND AT BEDTIME. 60 each 5   albuterol (VENTOLIN HFA) 108 (90 Base) MCG/ACT inhaler INHALE 2 PUFFS BY MOUTH EVERY 4-6 HOURS AS NEEDED FOR COUGH OR WHEEZE 18 g 1   ALPRAZolam (XANAX) 0.25 MG tablet Take 1 tablet (0.25 mg total) by mouth 2 (two) times daily as needed for anxiety 30 tablet 1   aspirin 81 MG tablet Take 81 mg by mouth daily.     beclomethasone (QVAR REDIHALER) 80 MCG/ACT inhaler Use 3 puffs 3 times daily during asthma flares. 10.6 g 5   Blood Glucose Monitoring Suppl (FREESTYLE LITE) w/Device  KIT USE TO TEST TWO TIMES DAILY PRIOR TO MEALS 1 kit 3   cetirizine (ZYRTEC) 10 MG tablet Take 10 mg by mouth daily.     Cholecalciferol (VITAMIN D3) 2000 UNITS TABS Take 1 tablet by mouth daily. With Vitamin K2 in it     ciclopirox (PENLAC) 8 % solution APPLY TO THE AFFECTED AREA(S) AT BEDTIME AND EVERY 7 DAYS CLEAN NAILS WITH ALCOHOL 13.2 mL 1   Cyanocobalamin (VITAMIN B-12 PO) Take 1 tablet by mouth daily.     dapagliflozin propanediol (FARXIGA) 10 MG TABS tablet Take 1 tablet by mouth once a day 90 tablet 3   Dextromethorphan-Guaifenesin (MUCINEX DM PO) Take by mouth as needed. Reported on 03/29/2016     Estradiol 10 MCG TABS vaginal tablet Place 1 tablet vaginally as needed.     Estradiol 10 MCG TABS vaginal tablet INSERT 1 TABLET TWICE A WEEK VAGINALLY AS DIRECTED 8 tablet 12   ezetimibe (ZETIA) 10 MG tablet Take 1 tablet (10 mg total) by mouth daily. Pt must keep upcoming appt in July for further refills 90 tablet 0   famotidine (PEPCID) 40 MG tablet TAKE ONE TABLET BY MOUTH ONCE DAILY. INCREASE TO ONE TABLET TWICE DAILY DURING FLARE-UP. 60 tablet 5   fluticasone (FLONASE) 50 MCG/ACT nasal spray INSTILL 1 TO 2 SPRAYS INTO EACH NOSTRIL DAILY FOR STUFFY NOSE OR DRAINAGE 48 g 5   glucose blood test strip USE TO TEST TWICE A DAY BEFORE MEALS 200 strip 3   glucose blood test strip USE WHEN CHECKING BLOOD SUGAR TWICE DAILY PRIOR TO MEALS 200 strip 3   Lancets (FREESTYLE) lancets USE TO TEST TWO TIMES DAILY PRIOR TO MEALS 200 each 3   magnesium gluconate (MAGONATE) 500 MG tablet Take 500 mg by mouth as needed.     metFORMIN (GLUCOPHAGE) 500 MG tablet Take 500 mg by mouth 2 (two) times a day.      metFORMIN (GLUCOPHAGE-XR) 500 MG 24 hr tablet TAKE 1 TABLET BY MOUTH TWICE DAILY WITH A MEAL 180 tablet 2   metoprolol succinate (TOPROL-XL) 25 MG 24 hr tablet TAKE 1 TABLET BY MOUTH TWICE DAILY 180 tablet 3   metoprolol tartrate (LOPRESSOR) 25 MG tablet Take 1 tablet (25 mg total) by mouth 2 (two) times  daily. Please keep upcoming appt in December with Dr. Tamala Julian for future refills. Thank you 180 tablet 0   Multiple Vitamins-Minerals (MULTIVITAL PO) Take 1 tablet by mouth daily.     nitroGLYCERIN (NITROSTAT) 0.4 MG SL tablet Place 0.4 mg under the tongue every 5 (five) minutes as needed for chest pain (3 doses MAX). Reported on 12/01/2015     Probiotic Product (PROBIOTIC PO) Take 1 tablet  by mouth daily. Pt takes Restora     rosuvastatin (CRESTOR) 40 MG tablet Take 1 tablet (40 mg total) by mouth daily. Please keep upcoming appt in December with Dr. Tamala Julian for future refills. Thank you 90 tablet 0   rosuvastatin (CRESTOR) 40 MG tablet TAKE 1 TABLET BY MOUTH ONCE A DAY 90 tablet 3   sodium chloride (OCEAN) 0.65 % SOLN nasal spray Place 1 spray into both nostrils as needed for congestion.     TRUE METRIX BLOOD GLUCOSE TEST test strip   3   TRUEPLUS LANCETS 30G MISC   3   valACYclovir (VALTREX) 500 MG tablet Take 500 mg by mouth daily as needed (cold sores/blisters). Reported on 12/01/2015     No current facility-administered medications on file prior to visit.    ALLERGIES: Allergies  Allergen Reactions   Biaxin [Clarithromycin] Itching   Cardiolite [Technetium-51m Itching   Ciprofloxacin Itching   Erythromycin Base Itching   Montelukast Sodium Itching   Niaspan [Niacin Er] Other (See Comments)    flushing   Penicillins Itching   Prilosec [Omeprazole] Itching   Singulair [Montelukast]    Ticlid [Ticlopidine Hcl] Itching    FAMILY HISTORY: Family History  Problem Relation Age of Onset   Diabetes Mother    Stroke Mother    Breast cancer Mother [phone]  Prostate cancer Father    Diabetes Father    Hyperlipidemia Father    Heart attack Paternal Uncle        2 paternal uncles   Ovarian cancer Cousin     Objective:  Blood pressure 110/72, pulse 99, height 5' 4"  (1.626 m), weight 221 lb 6.4 oz (100.4 kg), SpO2 98 %. General: No acute distress.  Patient appears well-groomed.   Head:   Normocephalic/atraumatic Eyes:  fundi examined but not visualized Neck: supple, no paraspinal tenderness, full range of motion Back: No paraspinal tenderness Heart: regular rate and rhythm.  Murmur Lungs: Clear to auscultation bilaterally. Vascular: No carotid bruits. Neurological Exam: Mental status: alert and oriented to person, place, and time, recent and remote memory intact, fund of knowledge intact, attention and concentration intact, speech fluent and not dysarthric, language intact. Cranial nerves: CN I: not tested CN II: pupils equal, round and reactive to light, visual fields intact CN III, IV, VI:  full range of motion, no nystagmus, no ptosis CN V: facial sensation intact. CN VII: upper and lower face symmetric CN VIII: hearing intact CN IX, X: gag intact, uvula midline CN XI: sternocleidomastoid and trapezius muscles intact CN XII: tongue midline Bulk & Tone: normal, no fasciculations. Motor:  muscle strength 5/5 throughout Sensation:  Pinprick, temperature and vibratory sensation intact. Deep Tendon Reflexes:  2+ throughout,  toes downgoing.   Finger to nose testing:  Without dysmetria.   Heel to shin:  Without dysmetria.   Gait:  Normal station and stride.  Romberg negative.    Thank you for allowing me to take part in the care of this patient.  AMetta Clines DO  CC: RJosetta Huddle MD

## 2021-04-30 ENCOUNTER — Encounter: Payer: Self-pay | Admitting: Neurology

## 2021-04-30 ENCOUNTER — Other Ambulatory Visit: Payer: Self-pay

## 2021-04-30 ENCOUNTER — Telehealth: Payer: Self-pay | Admitting: Neurology

## 2021-04-30 ENCOUNTER — Ambulatory Visit (INDEPENDENT_AMBULATORY_CARE_PROVIDER_SITE_OTHER): Payer: 59

## 2021-04-30 ENCOUNTER — Other Ambulatory Visit: Payer: Self-pay | Admitting: Neurology

## 2021-04-30 ENCOUNTER — Ambulatory Visit: Payer: 59 | Admitting: Neurology

## 2021-04-30 ENCOUNTER — Other Ambulatory Visit: Payer: 59

## 2021-04-30 VITALS — BP 110/72 | HR 99 | Ht 64.0 in | Wt 221.4 lb

## 2021-04-30 DIAGNOSIS — R011 Cardiac murmur, unspecified: Secondary | ICD-10-CM | POA: Diagnosis not present

## 2021-04-30 DIAGNOSIS — I10 Essential (primary) hypertension: Secondary | ICD-10-CM

## 2021-04-30 DIAGNOSIS — G459 Transient cerebral ischemic attack, unspecified: Secondary | ICD-10-CM

## 2021-04-30 DIAGNOSIS — E119 Type 2 diabetes mellitus without complications: Secondary | ICD-10-CM

## 2021-04-30 DIAGNOSIS — R42 Dizziness and giddiness: Secondary | ICD-10-CM

## 2021-04-30 DIAGNOSIS — I25708 Atherosclerosis of coronary artery bypass graft(s), unspecified, with other forms of angina pectoris: Secondary | ICD-10-CM

## 2021-04-30 DIAGNOSIS — E785 Hyperlipidemia, unspecified: Secondary | ICD-10-CM | POA: Diagnosis not present

## 2021-04-30 NOTE — Telephone Encounter (Signed)
Pt called in to tell us she cancelled her appointments with [facility]

## 2021-04-30 NOTE — Telephone Encounter (Signed)
Advised \ pt to call GI to cancel appt so we can add a new order to the hospital.

## 2021-04-30 NOTE — Telephone Encounter (Signed)
Pt called in stating she wants to get the CT scan done at [facility]

## 2021-04-30 NOTE — Patient Instructions (Signed)
In one week, decrease aspirin back to 18mg  once a day Continue other medications Check CTA of head and neck Check complete echocardiogram 14 day cardiac event monitor Will get labs from PCP May return to work Follow up 6 months.

## 2021-04-30 NOTE — Progress Notes (Unsigned)
Enrolled patient for a 14 day Zio AT monitor to be mailed to patients home.  Dr Tamala Julian to read

## 2021-05-04 DIAGNOSIS — R42 Dizziness and giddiness: Secondary | ICD-10-CM | POA: Diagnosis not present

## 2021-05-04 DIAGNOSIS — D509 Iron deficiency anemia, unspecified: Secondary | ICD-10-CM | POA: Diagnosis not present

## 2021-05-04 DIAGNOSIS — G4733 Obstructive sleep apnea (adult) (pediatric): Secondary | ICD-10-CM | POA: Diagnosis not present

## 2021-05-04 DIAGNOSIS — E785 Hyperlipidemia, unspecified: Secondary | ICD-10-CM | POA: Diagnosis not present

## 2021-05-04 DIAGNOSIS — I350 Nonrheumatic aortic (valve) stenosis: Secondary | ICD-10-CM | POA: Diagnosis not present

## 2021-05-04 DIAGNOSIS — I2510 Atherosclerotic heart disease of native coronary artery without angina pectoris: Secondary | ICD-10-CM | POA: Diagnosis not present

## 2021-05-04 DIAGNOSIS — I639 Cerebral infarction, unspecified: Secondary | ICD-10-CM | POA: Diagnosis not present

## 2021-05-04 DIAGNOSIS — N6081 Other benign mammary dysplasias of right breast: Secondary | ICD-10-CM | POA: Diagnosis not present

## 2021-05-04 DIAGNOSIS — D1801 Hemangioma of skin and subcutaneous tissue: Secondary | ICD-10-CM | POA: Diagnosis not present

## 2021-05-04 DIAGNOSIS — E119 Type 2 diabetes mellitus without complications: Secondary | ICD-10-CM | POA: Diagnosis not present

## 2021-05-04 NOTE — Telephone Encounter (Signed)
LMOVM have to change location on PA. Once that is done we will call and schedule Imaging with Hospital.

## 2021-05-04 NOTE — Telephone Encounter (Signed)
Pt came in to follow up on [place] scheduling her CT scan with the hospital.

## 2021-05-05 ENCOUNTER — Other Ambulatory Visit: Payer: Self-pay

## 2021-05-05 ENCOUNTER — Ambulatory Visit (HOSPITAL_COMMUNITY): Payer: 59 | Attending: Cardiovascular Disease

## 2021-05-05 DIAGNOSIS — R42 Dizziness and giddiness: Secondary | ICD-10-CM

## 2021-05-05 DIAGNOSIS — G459 Transient cerebral ischemic attack, unspecified: Secondary | ICD-10-CM | POA: Insufficient documentation

## 2021-05-05 LAB — ECHOCARDIOGRAM COMPLETE
AR max vel: 1.08 cm2
AV Area VTI: 1.11 cm2
AV Area mean vel: 1.15 cm2
AV Mean grad: 38 mmHg
AV Peak grad: 65.9 mmHg
Ao pk vel: 4.06 m/s
Area-P 1/2: 2.05 cm2
MV VTI: 2.14 cm2
P 1/2 time: 295 msec
S' Lateral: 3.2 cm

## 2021-05-05 NOTE — Telephone Encounter (Signed)
Pt advised we will try and get that started.  Per pt she had it scheduled at GI on 05/19/21.  Advised pt that there is no appt schedule. Please let us know which location she wants.

## 2021-05-05 NOTE — Telephone Encounter (Signed)
Patient called to see if her CT has been authorized yet.   Patient also said she has to wear a hear monitor for two week starting today. FYI only, I think.

## 2021-05-07 DIAGNOSIS — R42 Dizziness and giddiness: Secondary | ICD-10-CM | POA: Diagnosis not present

## 2021-05-07 DIAGNOSIS — G459 Transient cerebral ischemic attack, unspecified: Secondary | ICD-10-CM | POA: Diagnosis not present

## 2021-05-10 ENCOUNTER — Other Ambulatory Visit (HOSPITAL_COMMUNITY): Payer: Self-pay

## 2021-05-10 ENCOUNTER — Telehealth: Payer: Self-pay

## 2021-05-10 ENCOUNTER — Telehealth: Payer: Self-pay | Admitting: Neurology

## 2021-05-10 MED FILL — Glucose Blood Test Strip: 90 days supply | Qty: 200 | Fill #0 | Status: AC

## 2021-05-10 MED FILL — Metoprolol Succinate Tab ER 24HR 25 MG (Tartrate Equiv): ORAL | 90 days supply | Qty: 180 | Fill #0 | Status: AC

## 2021-05-10 MED FILL — Famotidine Tab 40 MG: ORAL | 30 days supply | Qty: 60 | Fill #1 | Status: AC

## 2021-05-10 NOTE — Telephone Encounter (Signed)
Telephone call to pt today to the status of our call last week.  Per pt she never called GI. She only saw the visit in her mychart.  Pt has a Heart monitor on right now.   Advised pt if she wants to go to [facility] on that date please give them a call to make sure is still set for that date so we can get PA sent over to them and they can start PA and I can changed the order to them. If not we will start PA for the hospital. As order was changed per pt request.  Also advised pt she might have to go the hospital if the heart monitor she has on will not be taken of before her imaging at [facility].   Pt will call GI today.

## 2021-05-10 NOTE — Telephone Encounter (Signed)
Pt called in, she would like a call back from sheena. Stated she was just speaking with her. [phone]

## 2021-05-10 NOTE — Progress Notes (Signed)
No PA is needed

## 2021-05-10 NOTE — Telephone Encounter (Signed)
Per pt [facility] did not have aan appt for her please start PA for hospital.  Advised pt we sill start pa today and once approved we will call to schedule.

## 2021-05-10 NOTE — Telephone Encounter (Signed)
Pt called and informed that CT Angio of head and neck is July 20th she has to be at [facility] at 12:15 test will be at 12:30

## 2021-05-11 ENCOUNTER — Other Ambulatory Visit (HOSPITAL_COMMUNITY): Payer: Self-pay

## 2021-05-12 ENCOUNTER — Other Ambulatory Visit (HOSPITAL_COMMUNITY): Payer: Self-pay

## 2021-05-12 ENCOUNTER — Other Ambulatory Visit: Payer: Self-pay

## 2021-05-12 DIAGNOSIS — I25708 Atherosclerosis of coronary artery bypass graft(s), unspecified, with other forms of angina pectoris: Secondary | ICD-10-CM

## 2021-05-12 DIAGNOSIS — E119 Type 2 diabetes mellitus without complications: Secondary | ICD-10-CM

## 2021-05-12 DIAGNOSIS — I10 Essential (primary) hypertension: Secondary | ICD-10-CM

## 2021-05-12 NOTE — Progress Notes (Signed)
Per [facility] scheduling pt has to have labs drawn before her CTA.

## 2021-05-13 ENCOUNTER — Other Ambulatory Visit (HOSPITAL_COMMUNITY): Payer: Self-pay

## 2021-05-13 DIAGNOSIS — H401421 Capsular glaucoma with pseudoexfoliation of lens, left eye, mild stage: Secondary | ICD-10-CM | POA: Diagnosis not present

## 2021-05-13 DIAGNOSIS — H2513 Age-related nuclear cataract, bilateral: Secondary | ICD-10-CM | POA: Diagnosis not present

## 2021-05-13 MED ORDER — METFORMIN HCL ER 500 MG PO TB24
500.0000 mg | ORAL_TABLET | Freq: Two times a day (BID) | ORAL | 3 refills | Status: DC
  Filled 2021-05-13 – 2021-07-07 (×2): qty 180, 90d supply, fill #0

## 2021-05-19 ENCOUNTER — Other Ambulatory Visit: Payer: Self-pay

## 2021-05-19 ENCOUNTER — Other Ambulatory Visit (HOSPITAL_COMMUNITY): Payer: Self-pay

## 2021-05-19 ENCOUNTER — Other Ambulatory Visit (HOSPITAL_COMMUNITY): Payer: Self-pay | Admitting: Student

## 2021-05-19 ENCOUNTER — Inpatient Hospital Stay: Admission: RE | Admit: 2021-05-19 | Payer: 59 | Source: Ambulatory Visit

## 2021-05-19 ENCOUNTER — Other Ambulatory Visit: Payer: 59

## 2021-05-19 ENCOUNTER — Ambulatory Visit (HOSPITAL_COMMUNITY)
Admission: RE | Admit: 2021-05-19 | Discharge: 2021-05-19 | Disposition: A | Discharge status: Home / Self Care | Payer: 59 | Source: Ambulatory Visit | Attending: Neurology | Admitting: Neurology

## 2021-05-19 ENCOUNTER — Encounter (HOSPITAL_COMMUNITY): Payer: Self-pay

## 2021-05-19 DIAGNOSIS — G459 Transient cerebral ischemic attack, unspecified: Secondary | ICD-10-CM

## 2021-05-19 MED ORDER — PREDNISONE 50 MG PO TABS
ORAL_TABLET | ORAL | 0 refills | Status: DC
  Filled 2021-05-19: qty 3, 1d supply, fill #0

## 2021-05-19 MED ORDER — DIPHENHYDRAMINE HCL 50 MG PO TABS
50.0000 mg | ORAL_TABLET | Freq: Every evening | ORAL | 0 refills | Status: DC | PRN
  Filled 2021-05-19: qty 1, 1d supply, fill #0

## 2021-05-20 ENCOUNTER — Ambulatory Visit (HOSPITAL_COMMUNITY)
Admission: RE | Admit: 2021-05-20 | Discharge: 2021-05-20 | Disposition: A | Discharge status: Home / Self Care | Payer: 59 | Source: Ambulatory Visit | Attending: Neurology | Admitting: Neurology

## 2021-05-20 DIAGNOSIS — I672 Cerebral atherosclerosis: Secondary | ICD-10-CM | POA: Diagnosis not present

## 2021-05-20 DIAGNOSIS — I771 Stricture of artery: Secondary | ICD-10-CM | POA: Diagnosis not present

## 2021-05-20 DIAGNOSIS — I63233 Cerebral infarction due to unspecified occlusion or stenosis of bilateral carotid arteries: Secondary | ICD-10-CM | POA: Diagnosis not present

## 2021-05-20 DIAGNOSIS — G459 Transient cerebral ischemic attack, unspecified: Secondary | ICD-10-CM | POA: Insufficient documentation

## 2021-05-20 DIAGNOSIS — M47812 Spondylosis without myelopathy or radiculopathy, cervical region: Secondary | ICD-10-CM | POA: Diagnosis not present

## 2021-05-20 LAB — POCT I-STAT CREATININE: Creatinine, Ser: 0.6 mg/dL (ref 0.44–1.00)

## 2021-05-20 MED ORDER — IOHEXOL 350 MG/ML SOLN
100.0000 mL | Freq: Once | INTRAVENOUS | Status: AC | PRN
  Administered 2021-05-20: 75 mL via INTRAVENOUS

## 2021-05-20 NOTE — H&P (View-Only) (Signed)
Cardiology Office Note:    Date:  05/21/2021   ID:  Deanna Simmons, DOB 1951-01-11, MRN [id number]  PCP:  Josetta Huddle, MD  Cardiologist:  Sinclair Grooms, MD   Referring MD: Josetta Huddle, MD   No chief complaint on file.   History of Present Illness:    Deanna Simmons is a 70 y.o. female with a hx of CAD, prior coronary bypass grafting 2004 with SVG to the PDA, essential hypertension, asthma, diabetes mellitus, cryptogenic stroke manifesting as right retinal embolus, recent TIA June 2022 still in the process of being worked up by Dr. Tomi Likens, progression to severe aortic stenosis noted on TIA work-up, systemic lupus erythematosus in remission, morbid obesity, and suspicion for sleep apnea.   Deanna Simmons is not having having specific cardiac symptoms although exertional activities are limited by significant bilateral knee arthritis.  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  She does a fair amount of walking but it is not brisk.  She has a history of coronary artery disease.  She underwent emergency coronary artery bypass grafting with SVG to PDA in the setting of coronary artery dissection in 2004, has been well since that time without any recurrent angina.  The systolic heart murmur has progressed over the last 10 years the current critical state with velocities greater than 4 m/s.  He denies angina, orthopnea, PND, and syncope.  Past Medical History:  Diagnosis Date   Abnormal finding on Pap smear, ASCUS    Anemia    Asthma    Chondromalacia of knee    Coronary artery disease    Diabetes mellitus without complication ([place])    Diverticulum 12/11/2002   in cecum   Fibroids    h/o   Glucose intolerance (impaired glucose tolerance)    H/O elevated lipids    H/O tinea cruris 02/2010   History of ASCVD    s/p CABG   Hypercholesteremia    Hypertension    Internal hemorrhoids    h/o   Irregular menses    h/o   MI (myocardial infarction) ([place]) age 35   Obesity    Pneumonia  07/2008   Synovitis of knee    Systemic lupus erythematosus ([facility])    with nephritis   Vulvitis    h/o    Past Surgical History:  Procedure Laterality Date   CESAREAN SECTION     CORONARY ARTERY BYPASS GRAFT     Endometrial curettage  03/29/2000   ROTATOR CUFF REPAIR     TUBAL LIGATION     UPPER GASTROINTESTINAL ENDOSCOPY  12/11/2002    Current Medications: Current Meds  Medication Sig   ADVAIR DISKUS 250-50 MCG/DOSE AEPB INHALE 1 PUFF INTO THE LUNGS IN THE MORNING AND AT BEDTIME.   albuterol (VENTOLIN HFA) 108 (90 Base) MCG/ACT inhaler INHALE 2 PUFFS BY MOUTH EVERY 4-6 HOURS AS NEEDED FOR COUGH OR WHEEZE   ALPRAZolam (XANAX) 0.25 MG tablet Take 1 tablet (0.25 mg total) by mouth 2 (two) times daily as needed for anxiety   aspirin 81 MG tablet Take 81 mg by mouth in the morning and at bedtime.   beclomethasone (QVAR REDIHALER) 80 MCG/ACT inhaler Use 3 puffs 3 times daily during asthma flares.   Blood Glucose Monitoring Suppl (FREESTYLE LITE) w/Device KIT USE TO TEST TWO TIMES DAILY PRIOR TO MEALS   cetirizine (ZYRTEC) 10 MG tablet Take 10 mg by mouth daily.   Cholecalciferol (VITAMIN D3) 2000 UNITS TABS Take 1 tablet by mouth  daily. With Vitamin K2 in it   ciclopirox (PENLAC) 8 % solution APPLY TO THE AFFECTED AREA(S) AT BEDTIME AND EVERY 7 DAYS CLEAN NAILS WITH ALCOHOL   Cyanocobalamin (VITAMIN B-12 PO) Take 1 tablet by mouth daily.   Dextromethorphan-Guaifenesin (MUCINEX DM PO) Take by mouth as needed. Reported on 03/29/2016   Estradiol 10 MCG TABS vaginal tablet Place 1 tablet vaginally as needed.   Estradiol 10 MCG TABS vaginal tablet INSERT 1 TABLET TWICE A WEEK VAGINALLY AS DIRECTED   ezetimibe (ZETIA) 10 MG tablet Take 1 tablet (10 mg total) by mouth daily. Pt must keep upcoming appt in July for further refills   famotidine (PEPCID) 40 MG tablet TAKE ONE TABLET BY MOUTH ONCE DAILY. INCREASE TO ONE TABLET TWICE DAILY DURING FLARE-UP.   fluticasone (FLONASE) 50 MCG/ACT nasal spray  INSTILL 1 TO 2 SPRAYS INTO EACH NOSTRIL DAILY FOR STUFFY NOSE OR DRAINAGE   glucose blood test strip USE TO TEST TWICE A DAY BEFORE MEALS   glucose blood test strip USE WHEN CHECKING BLOOD SUGAR TWICE DAILY PRIOR TO MEALS   Lancets (FREESTYLE) lancets USE TO TEST TWO TIMES DAILY PRIOR TO MEALS   magnesium gluconate (MAGONATE) 500 MG tablet Take 500 mg by mouth as needed.   metFORMIN (GLUCOPHAGE) 500 MG tablet Take 500 mg by mouth 2 (two) times a day.    metFORMIN (GLUCOPHAGE-XR) 500 MG 24 hr tablet TAKE 1 TABLET BY MOUTH TWICE DAILY WITH A MEAL   metoprolol succinate (TOPROL-XL) 25 MG 24 hr tablet TAKE 1 TABLET BY MOUTH TWICE DAILY   Multiple Vitamins-Minerals (MULTIVITAL PO) Take 1 tablet by mouth daily.   nitroGLYCERIN (NITROSTAT) 0.4 MG SL tablet Place 0.4 mg under the tongue every 5 (five) minutes as needed for chest pain (3 doses MAX). Reported on 12/01/2015   Probiotic Product (PROBIOTIC PO) Take 1 tablet by mouth daily. Pt takes Restora   rosuvastatin (CRESTOR) 40 MG tablet Take 1 tablet (40 mg total) by mouth daily. Please keep upcoming appt in December with Dr. Tamala Julian for future refills. Thank you   rosuvastatin (CRESTOR) 40 MG tablet TAKE 1 TABLET BY MOUTH ONCE A DAY   sodium chloride (OCEAN) 0.65 % SOLN nasal spray Place 1 spray into both nostrils as needed for congestion.   TRUE METRIX BLOOD GLUCOSE TEST test strip    TRUEPLUS LANCETS 30G MISC    valACYclovir (VALTREX) 500 MG tablet Take 500 mg by mouth daily as needed (cold sores/blisters). Reported on 12/01/2015   [DISCONTINUED] diphenhydrAMINE (BENADRYL) 50 MG tablet Take 1 tablet (50 mg total) by mouth at bedtime as needed for up to 1 dose for itching. Contrast allergy premedication -Take 50 mg Benadryl at 4:30 PM 05/20/21   [DISCONTINUED] metoprolol tartrate (LOPRESSOR) 25 MG tablet Take 1 tablet (25 mg total) by mouth 2 (two) times daily. Please keep upcoming appt in December with Dr. Tamala Julian for future refills. Thank you    [DISCONTINUED] predniSONE (DELTASONE) 50 MG tablet For contrast allergy premedication. Take 1 tablet by mouth (50 mg) at 4:30 am, 10:30 am, and 4:30 pm on 05/20/21     Allergies:   Biaxin [clarithromycin], Cardiolite [technetium-66m, Ciprofloxacin, Erythromycin base, Montelukast sodium, Niaspan [niacin er], Penicillins, Prilosec [omeprazole], Singulair [montelukast], Ticlid [ticlopidine hcl], and Iodinated diagnostic agents   Social History   Socioeconomic History   Marital status: Married    Spouse name: BAlvester Chou  Number of children: 4   Years of education: 12+   Highest education level: Not on file  Occupational History   Occupation: CARE MANAGEMENT ASST    Employer: [employer]  Tobacco Use   Smoking status: Former   Smokeless tobacco: Never  Substance and Sexual Activity   Alcohol use: Yes    Comment: occasional   Drug use: No   Sexual activity: Not on file  Other Topics Concern   Not on file  Social History Narrative   Lives with her husband. Their children are grown, 1 lives in [place], 1 in [place], [place],  1 in [place], [place], and one in [place], [place].   Right handed   Social Determinants of Health   Financial Resource Strain: Not on file  Food Insecurity: Not on file  Transportation Needs: Not on file  Physical Activity: Not on file  Stress: Not on file  Social Connections: Not on file     Family History: The patient's family history includes Breast cancer (age of onset: 34) in her mother; Diabetes in her father and mother; Heart attack in her paternal uncle; Hyperlipidemia in her father; Ovarian cancer in her cousin; Prostate cancer in her father; Stroke in her mother.  ROS:   Please see the history of present illness.    Knee arthritis with pain.  Decreased vision due to bilateral cataracts.  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  Needs to have cataract surgery done relatively soon according to her ophthalmologist.  Also no feel will be safe to have  cataract surgery before the valve is fixed.  We discussed that it probably would be.  All other systems reviewed and are negative.  EKGs/Labs/Other Studies Reviewed:    The following studies were reviewed today: 2D Doppler echocardiogram May 05, 2021: IMPRESSIONS     1. Severe aortic stenosis is present. V max 4.06 m/s, MG 38 mmHG. AVA  over estimated (1.1 cm2) as LVOT PW signal obtained too close to the valve  (LVOT VTI 39 cm). Based on Vmax and mean gradient, aortic stenosis is  severe. There is mild to moderate  aortic regurgitation, but this is not well visualized. The aortic valve is  tricuspid. There is severe calcifcation of the aortic valve. There is  severe thickening of the aortic valve. Aortic valve regurgitation is mild  to moderate. Severe aortic valve  stenosis.   2. Left ventricular ejection fraction, by estimation, is 55 to 60%. Left  ventricular ejection fraction by 3D volume is 57 %. The left ventricle has  normal function. The left ventricle has no regional wall motion  abnormalities. Left ventricular diastolic   function could not be evaluated. The average left ventricular global  longitudinal strain is -22.2 %. The global longitudinal strain is normal.   3. Right ventricular systolic function is normal. The right ventricular  size is normal. There is normal pulmonary artery systolic pressure. The  estimated right ventricular systolic pressure is 29.9 mmHg.   4. Left atrial size was mild to moderately dilated.   5. Mild calcific mitral stenosis. MVA by VTI 2.04 cm2. MG 4 mmHG @ 64  bpm. The mitral valve is degenerative. Mild mitral valve regurgitation.  Mild mitral stenosis. The mean mitral valve gradient is 4.0 mmHg. Moderate  to severe mitral annular  calcification.   6. The inferior vena cava is normal in size with greater than 50%  respiratory variability, suggesting right atrial pressure of 3 mmHg.   Comparison(s): Changes from prior study are noted.  Aortic stenosis is now  severe.   EKG:  EKG not repeated  Recent Labs:  04/13/2021: ALT 25; BUN 10; Hemoglobin 11.6; Platelets 229; Potassium 4.5; Sodium 139 05/20/2021: Creatinine, Ser 0.60  Recent Lipid Panel    Component Value Date/Time   CHOL 120 03/11/2014 0841   TRIG 65.0 03/11/2014 0841   HDL 41.00 03/11/2014 0841   CHOLHDL 3 03/11/2014 0841   VLDL 13.0 03/11/2014 0841   LDLCALC 66 03/11/2014 0841    Physical Exam:    VS:  BP 118/70   Pulse 83   Ht _0  (1.626 m)   Wt 223 lb 6.4 oz (101.3 kg)   SpO2 98%   BMI 38.35 kg/m     Wt Readings from Last 3 Encounters:  05/21/21 223 lb 6.4 oz (101.3 kg)  04/30/21 221 lb 6.4 oz (100.4 kg)  10/20/20 228 lb 12.8 oz (103.8 kg)     GEN: Obese. No acute distress HEENT: Normal NECK: No JVD. LYMPHATICS: No lymphadenopathy CARDIAC: 4/6 systolic crescendo decrescendo aortic stenosis murmur. RRR no gallop, or edema. VASCULAR:  Normal Pulses. No bruits. RESPIRATORY:  Clear to auscultation without rales, wheezing or rhonchi  ABDOMEN: Soft, non-tender, non-distended, No pulsatile mass, MUSCULOSKELETAL: No deformity  SKIN: Warm and dry NEUROLOGIC:  Alert and oriented x 3 PSYCHIATRIC:  Normal affect   ASSESSMENT:    1. Severe aortic stenosis   2. TIA (transient ischemic attack)   3. Coronary artery disease of bypass graft of native heart with stable angina pectoris ([place])   4. Hypercholesteremia   5. Essential hypertension   6. Right carotid bruit   7. Apnea   8. Preoperative clearance    PLAN:    In order of problems listed above:  She is relatively sedentary.  She does not complaints of a lot of symptoms.  We will proceed with work-up for TAVR.  We discussed right and left heart catheterization with coronary angiography as a prelude to referral for the structural team to then proceed with TAVR work-up.  Hopefully coronary disease will not be a reason to have to consider open aortic valve replacement (SAVR).  Risks and  benefits of coronary angiography and right heart cath were discussed with the patient in detail.  Less than 1% risk of stroke, death, myocardial infarction, kidney injury, allergy, and limb ischemia were discussed and accepted by the patient.  Risk of bleeding will also be low but greater than 1%. She has now had at least 2 strokelike symptoms 1 of which involved her right eye several years ago and a recent episode of dizziness characterized as probable TIA by Dr. Tomi Likens.  On CT scan she does have evidence of a prior lacunar infarct.  Still awaiting the prolonged monitor which was ordered to exclude paroxysmal atrial fibs. There are no symptoms of angina.  Coronary angiography will define anatomy.  She has a saphenous vein graft to the right coronary. Continue high intensity statin therapy. Blood pressure control is adequate on current medical regimen. Carotid CT was performed and high-grade obstruction was not identified. Never been tested for sleep apnea but I have suspicion that it may be present. She is cleared to undergo bilateral cataract procedures even if done prior to TAVR.   Medication Adjustments/Labs and Tests Ordered: Current medicines are reviewed at length with the patient today.  Concerns regarding medicines are outlined above.  Orders Placed This Encounter  Procedures   Basic metabolic panel   CBC   Meds ordered this encounter  Medications   diphenhydrAMINE (BENADRYL) 50 MG tablet    Sig: Contrast allergy premedication -Take  50 mg Benadryl with your last dose of Prednisone    Dispense:  1 tablet    Refill:  0   predniSONE (DELTASONE) 50 MG tablet    Sig: For contrast allergy premedication. Take 1 tablet by mouth (50 mg) 13 hours, 7 hours and 1 hour prior to your procedure    Dispense:  3 tablet    Refill:  0    Patient Instructions  Medication Instructions:  Your physician recommends that you continue on your current medications as directed. Please refer to the Current  Medication list given to you today.  *If you need a refill on your cardiac medications before your next appointment, please call your pharmacy*   Lab Work: BMET and CBC prior to cath  If you have labs (blood work) drawn today and your tests are completely normal, you will receive your results only by: [place] (if you have MyChart) OR A paper copy in the mail If you have any lab test that is abnormal or we need to change your treatment, we will call you to review the results.   Testing/Procedures: Your physician has requested that you have a cardiac catheterization. Cardiac catheterization is used to diagnose and/or treat various heart conditions. Doctors may recommend this procedure for a number of different reasons. The most common reason is to evaluate chest pain. Chest pain can be a symptom of coronary artery disease (CAD), and cardiac catheterization can show whether plaque is narrowing or blocking your heart's arteries. This procedure is also used to evaluate the valves, as well as measure the blood flow and oxygen levels in different parts of your heart. For further information please visit [email or web address]. Please follow instruction sheet, as given.   Follow-Up:  Follow up is going to depend on next steps with valve replacement.   Other Instructions   [place] OFFICE [place], [place] [address] Dept: [phone] Loc: [facility]  05/21/2021  You are scheduled for a Cardiac Catheterization on Friday, August 12 with Dr. Daneen Schick.  1. Please arrive at the [facility] (Main Entrance A) at [facility]: [address] [place], [place] [address] at 5:30 AM (This time is two hours before your procedure to ensure your preparation). Free valet parking service is available.   Special note: Every effort is made to have your procedure done on  time. Please understand that emergencies sometimes delay scheduled procedures.  2. Diet: Do not eat solid foods after midnight.  The patient may have clear liquids until 5am upon the day of the procedure.  3. Labs: You will need to have blood drawn prior to your cath. You do not need to be fasting.  4. Medication instructions in preparation for your procedure:   Contrast Allergy: Yes, Please take Prednisone 41m by mouth at: Thirteen hours prior to cath 6:00pm on Thursday Seven hours prior to cath 12:00am on Friday And prior to leaving home please take last dose of Prednisone 555mand Benadryl 5038my mouth.  Do not take Diabetes Med Glucophage (Metformin) on the day of the procedure and HOLD 48 HOURS AFTER THE PROCEDURE. Do not take your [place] morning of your test.  On the morning of your procedure, take your Aspirin and any morning medicines NOT listed above.  You may use sips of water.  5. Plan for one night stay--bring personal belongings. 6. Bring a current list of  your medications and current insurance cards. 7. You MUST have a responsible person to drive you home. 8. Someone MUST be with you the first 24 hours after you arrive home or your discharge will be delayed. 9. Please wear clothes that are easy to get on and off and wear slip-on shoes.  Thank you for allowing [place] to care for you!   -- [facility]     Signed, Sinclair Grooms, MD  05/21/2021 5:16 PM    [place]

## 2021-05-20 NOTE — Progress Notes (Signed)
Cardiology Office Note:    Date:  05/21/2021   ID:  Deanna Simmons, DOB 1951-01-11, MRN [id number]  PCP:  Josetta Huddle, MD  Cardiologist:  Sinclair Grooms, MD   Referring MD: Josetta Huddle, MD   No chief complaint on file.   History of Present Illness:    Deanna Simmons is a 70 y.o. female with a hx of CAD, prior coronary bypass grafting 2004 with SVG to the PDA, essential hypertension, asthma, diabetes mellitus, cryptogenic stroke manifesting as right retinal embolus, recent TIA June 2022 still in the process of being worked up by Dr. Tomi Likens, progression to severe aortic stenosis noted on TIA work-up, systemic lupus erythematosus in remission, morbid obesity, and suspicion for sleep apnea.   Deanna Simmons is not having having specific cardiac symptoms although exertional activities are limited by significant bilateral knee arthritis.  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  She does a fair amount of walking but it is not brisk.  She has a history of coronary artery disease.  She underwent emergency coronary artery bypass grafting with SVG to PDA in the setting of coronary artery dissection in 2004, has been well since that time without any recurrent angina.  The systolic heart murmur has progressed over the last 10 years the current critical state with velocities greater than 4 m/s.  He denies angina, orthopnea, PND, and syncope.  Past Medical History:  Diagnosis Date   Abnormal finding on Pap smear, ASCUS    Anemia    Asthma    Chondromalacia of knee    Coronary artery disease    Diabetes mellitus without complication ([place])    Diverticulum 12/11/2002   in cecum   Fibroids    h/o   Glucose intolerance (impaired glucose tolerance)    H/O elevated lipids    H/O tinea cruris 02/2010   History of ASCVD    s/p CABG   Hypercholesteremia    Hypertension    Internal hemorrhoids    h/o   Irregular menses    h/o   MI (myocardial infarction) ([place]) age 35   Obesity    Pneumonia  07/2008   Synovitis of knee    Systemic lupus erythematosus (HCC)    with nephritis   Vulvitis    h/o    Past Surgical History:  Procedure Laterality Date   CESAREAN SECTION     CORONARY ARTERY BYPASS GRAFT     Endometrial curettage  03/29/2000   ROTATOR CUFF REPAIR     TUBAL LIGATION     UPPER GASTROINTESTINAL ENDOSCOPY  12/11/2002    Current Medications: Current Meds  Medication Sig   ADVAIR DISKUS 250-50 MCG/DOSE AEPB INHALE 1 PUFF INTO THE LUNGS IN THE MORNING AND AT BEDTIME.   albuterol (VENTOLIN HFA) 108 (90 Base) MCG/ACT inhaler INHALE 2 PUFFS BY MOUTH EVERY 4-6 HOURS AS NEEDED FOR COUGH OR WHEEZE   ALPRAZolam (XANAX) 0.25 MG tablet Take 1 tablet (0.25 mg total) by mouth 2 (two) times daily as needed for anxiety   aspirin 81 MG tablet Take 81 mg by mouth in the morning and at bedtime.   beclomethasone (QVAR REDIHALER) 80 MCG/ACT inhaler Use 3 puffs 3 times daily during asthma flares.   Blood Glucose Monitoring Suppl (FREESTYLE LITE) w/Device KIT USE TO TEST TWO TIMES DAILY PRIOR TO MEALS   cetirizine (ZYRTEC) 10 MG tablet Take 10 mg by mouth daily.   Cholecalciferol (VITAMIN D3) 2000 UNITS TABS Take 1 tablet by mouth  daily. With Vitamin K2 in it   ciclopirox (PENLAC) 8 % solution APPLY TO THE AFFECTED AREA(S) AT BEDTIME AND EVERY 7 DAYS CLEAN NAILS WITH ALCOHOL   Cyanocobalamin (VITAMIN B-12 PO) Take 1 tablet by mouth daily.   Dextromethorphan-Guaifenesin (MUCINEX DM PO) Take by mouth as needed. Reported on 03/29/2016   Estradiol 10 MCG TABS vaginal tablet Place 1 tablet vaginally as needed.   Estradiol 10 MCG TABS vaginal tablet INSERT 1 TABLET TWICE A WEEK VAGINALLY AS DIRECTED   ezetimibe (ZETIA) 10 MG tablet Take 1 tablet (10 mg total) by mouth daily. Pt must keep upcoming appt in July for further refills   famotidine (PEPCID) 40 MG tablet TAKE ONE TABLET BY MOUTH ONCE DAILY. INCREASE TO ONE TABLET TWICE DAILY DURING FLARE-UP.   fluticasone (FLONASE) 50 MCG/ACT nasal spray  INSTILL 1 TO 2 SPRAYS INTO EACH NOSTRIL DAILY FOR STUFFY NOSE OR DRAINAGE   glucose blood test strip USE TO TEST TWICE A DAY BEFORE MEALS   glucose blood test strip USE WHEN CHECKING BLOOD SUGAR TWICE DAILY PRIOR TO MEALS   Lancets (FREESTYLE) lancets USE TO TEST TWO TIMES DAILY PRIOR TO MEALS   magnesium gluconate (MAGONATE) 500 MG tablet Take 500 mg by mouth as needed.   metFORMIN (GLUCOPHAGE) 500 MG tablet Take 500 mg by mouth 2 (two) times a day.    metFORMIN (GLUCOPHAGE-XR) 500 MG 24 hr tablet TAKE 1 TABLET BY MOUTH TWICE DAILY WITH A MEAL   metoprolol succinate (TOPROL-XL) 25 MG 24 hr tablet TAKE 1 TABLET BY MOUTH TWICE DAILY   Multiple Vitamins-Minerals (MULTIVITAL PO) Take 1 tablet by mouth daily.   nitroGLYCERIN (NITROSTAT) 0.4 MG SL tablet Place 0.4 mg under the tongue every 5 (five) minutes as needed for chest pain (3 doses MAX). Reported on 12/01/2015   Probiotic Product (PROBIOTIC PO) Take 1 tablet by mouth daily. Pt takes Restora   rosuvastatin (CRESTOR) 40 MG tablet Take 1 tablet (40 mg total) by mouth daily. Please keep upcoming appt in December with Dr. Tamala Julian for future refills. Thank you   rosuvastatin (CRESTOR) 40 MG tablet TAKE 1 TABLET BY MOUTH ONCE A DAY   sodium chloride (OCEAN) 0.65 % SOLN nasal spray Place 1 spray into both nostrils as needed for congestion.   TRUE METRIX BLOOD GLUCOSE TEST test strip    TRUEPLUS LANCETS 30G MISC    valACYclovir (VALTREX) 500 MG tablet Take 500 mg by mouth daily as needed (cold sores/blisters). Reported on 12/01/2015   [DISCONTINUED] diphenhydrAMINE (BENADRYL) 50 MG tablet Take 1 tablet (50 mg total) by mouth at bedtime as needed for up to 1 dose for itching. Contrast allergy premedication -Take 50 mg Benadryl at 4:30 PM 05/20/21   [DISCONTINUED] metoprolol tartrate (LOPRESSOR) 25 MG tablet Take 1 tablet (25 mg total) by mouth 2 (two) times daily. Please keep upcoming appt in December with Dr. Tamala Julian for future refills. Thank you    [DISCONTINUED] predniSONE (DELTASONE) 50 MG tablet For contrast allergy premedication. Take 1 tablet by mouth (50 mg) at 4:30 am, 10:30 am, and 4:30 pm on 05/20/21     Allergies:   Biaxin [clarithromycin], Cardiolite [technetium-66m, Ciprofloxacin, Erythromycin base, Montelukast sodium, Niaspan [niacin er], Penicillins, Prilosec [omeprazole], Singulair [montelukast], Ticlid [ticlopidine hcl], and Iodinated diagnostic agents   Social History   Socioeconomic History   Marital status: Married    Spouse name: BAlvester Chou  Number of children: 4   Years of education: 12+   Highest education level: Not on file  Occupational History   Occupation: CARE MANAGEMENT ASST    Employer: [employer]  Tobacco Use   Smoking status: Former   Smokeless tobacco: Never  Substance and Sexual Activity   Alcohol use: Yes    Comment: occasional   Drug use: No   Sexual activity: Not on file  Other Topics Concern   Not on file  Social History Narrative   Lives with her husband. Their children are grown, 1 lives in [place], 1 in [place], [place],  1 in [place], [place], and one in [place], [place].   Right handed   Social Determinants of Health   Financial Resource Strain: Not on file  Food Insecurity: Not on file  Transportation Needs: Not on file  Physical Activity: Not on file  Stress: Not on file  Social Connections: Not on file     Family History: The patient's family history includes Breast cancer (age of onset: 14) in her mother; Diabetes in her father and mother; Heart attack in her paternal uncle; Hyperlipidemia in her father; Ovarian cancer in her cousin; Prostate cancer in her father; Stroke in her mother.  ROS:   Please see the history of present illness.    Knee arthritis with pain.  Decreased vision due to bilateral cataracts.  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  Needs to have cataract surgery done relatively soon according to her ophthalmologist.  Also no feel will be safe to have  cataract surgery before the valve is fixed.  We discussed that it probably would be.  All other systems reviewed and are negative.  EKGs/Labs/Other Studies Reviewed:    The following studies were reviewed today: 2D Doppler echocardiogram May 05, 2021: IMPRESSIONS     1. Severe aortic stenosis is present. V max 4.06 m/s, MG 38 mmHG. AVA  over estimated (1.1 cm2) as LVOT PW signal obtained too close to the valve  (LVOT VTI 39 cm). Based on Vmax and mean gradient, aortic stenosis is  severe. There is mild to moderate  aortic regurgitation, but this is not well visualized. The aortic valve is  tricuspid. There is severe calcifcation of the aortic valve. There is  severe thickening of the aortic valve. Aortic valve regurgitation is mild  to moderate. Severe aortic valve  stenosis.   2. Left ventricular ejection fraction, by estimation, is 55 to 60%. Left  ventricular ejection fraction by 3D volume is 57 %. The left ventricle has  normal function. The left ventricle has no regional wall motion  abnormalities. Left ventricular diastolic   function could not be evaluated. The average left ventricular global  longitudinal strain is -22.2 %. The global longitudinal strain is normal.   3. Right ventricular systolic function is normal. The right ventricular  size is normal. There is normal pulmonary artery systolic pressure. The  estimated right ventricular systolic pressure is 67.1 mmHg.   4. Left atrial size was mild to moderately dilated.   5. Mild calcific mitral stenosis. MVA by VTI 2.04 cm2. MG 4 mmHG @ 64  bpm. The mitral valve is degenerative. Mild mitral valve regurgitation.  Mild mitral stenosis. The mean mitral valve gradient is 4.0 mmHg. Moderate  to severe mitral annular  calcification.   6. The inferior vena cava is normal in size with greater than 50%  respiratory variability, suggesting right atrial pressure of 3 mmHg.   Comparison(s): Changes from prior study are noted.  Aortic stenosis is now  severe.   EKG:  EKG not repeated  Recent Labs:  04/13/2021: ALT 25; BUN 10; Hemoglobin 11.6; Platelets 229; Potassium 4.5; Sodium 139 05/20/2021: Creatinine, Ser 0.60  Recent Lipid Panel    Component Value Date/Time   CHOL 120 03/11/2014 0841   TRIG 65.0 03/11/2014 0841   HDL 41.00 03/11/2014 0841   CHOLHDL 3 03/11/2014 0841   VLDL 13.0 03/11/2014 0841   LDLCALC 66 03/11/2014 0841    Physical Exam:    VS:  BP 118/70   Pulse 83   Ht _0  (1.626 m)   Wt 223 lb 6.4 oz (101.3 kg)   SpO2 98%   BMI 38.35 kg/m     Wt Readings from Last 3 Encounters:  05/21/21 223 lb 6.4 oz (101.3 kg)  04/30/21 221 lb 6.4 oz (100.4 kg)  10/20/20 228 lb 12.8 oz (103.8 kg)     GEN: Obese. No acute distress HEENT: Normal NECK: No JVD. LYMPHATICS: No lymphadenopathy CARDIAC: 4/6 systolic crescendo decrescendo aortic stenosis murmur. RRR no gallop, or edema. VASCULAR:  Normal Pulses. No bruits. RESPIRATORY:  Clear to auscultation without rales, wheezing or rhonchi  ABDOMEN: Soft, non-tender, non-distended, No pulsatile mass, MUSCULOSKELETAL: No deformity  SKIN: Warm and dry NEUROLOGIC:  Alert and oriented x 3 PSYCHIATRIC:  Normal affect   ASSESSMENT:    1. Severe aortic stenosis   2. TIA (transient ischemic attack)   3. Coronary artery disease of bypass graft of native heart with stable angina pectoris ([place])   4. Hypercholesteremia   5. Essential hypertension   6. Right carotid bruit   7. Apnea   8. Preoperative clearance    PLAN:    In order of problems listed above:  She is relatively sedentary.  She does not complaints of a lot of symptoms.  We will proceed with work-up for TAVR.  We discussed right and left heart catheterization with coronary angiography as a prelude to referral for the structural team to then proceed with TAVR work-up.  Hopefully coronary disease will not be a reason to have to consider open aortic valve replacement (SAVR).  Risks and  benefits of coronary angiography and right heart cath were discussed with the patient in detail.  Less than 1% risk of stroke, death, myocardial infarction, kidney injury, allergy, and limb ischemia were discussed and accepted by the patient.  Risk of bleeding will also be low but greater than 1%. She has now had at least 2 strokelike symptoms 1 of which involved her right eye several years ago and a recent episode of dizziness characterized as probable TIA by Dr. Tomi Likens.  On CT scan she does have evidence of a prior lacunar infarct.  Still awaiting the prolonged monitor which was ordered to exclude paroxysmal atrial fibs. There are no symptoms of angina.  Coronary angiography will define anatomy.  She has a saphenous vein graft to the right coronary. Continue high intensity statin therapy. Blood pressure control is adequate on current medical regimen. Carotid CT was performed and high-grade obstruction was not identified. Never been tested for sleep apnea but I have suspicion that it may be present. She is cleared to undergo bilateral cataract procedures even if done prior to TAVR.   Medication Adjustments/Labs and Tests Ordered: Current medicines are reviewed at length with the patient today.  Concerns regarding medicines are outlined above.  Orders Placed This Encounter  Procedures   Basic metabolic panel   CBC   Meds ordered this encounter  Medications   diphenhydrAMINE (BENADRYL) 50 MG tablet    Sig: Contrast allergy premedication -Take  50 mg Benadryl with your last dose of Prednisone    Dispense:  1 tablet    Refill:  0   predniSONE (DELTASONE) 50 MG tablet    Sig: For contrast allergy premedication. Take 1 tablet by mouth (50 mg) 13 hours, 7 hours and 1 hour prior to your procedure    Dispense:  3 tablet    Refill:  0    Patient Instructions  Medication Instructions:  Your physician recommends that you continue on your current medications as directed. Please refer to the Current  Medication list given to you today.  *If you need a refill on your cardiac medications before your next appointment, please call your pharmacy*   Lab Work: BMET and CBC prior to cath  If you have labs (blood work) drawn today and your tests are completely normal, you will receive your results only by: [place] (if you have MyChart) OR A paper copy in the mail If you have any lab test that is abnormal or we need to change your treatment, we will call you to review the results.   Testing/Procedures: Your physician has requested that you have a cardiac catheterization. Cardiac catheterization is used to diagnose and/or treat various heart conditions. Doctors may recommend this procedure for a number of different reasons. The most common reason is to evaluate chest pain. Chest pain can be a symptom of coronary artery disease (CAD), and cardiac catheterization can show whether plaque is narrowing or blocking your heart's arteries. This procedure is also used to evaluate the valves, as well as measure the blood flow and oxygen levels in different parts of your heart. For further information please visit HugeFiesta.[email or web address]. Please follow instruction sheet, as given.   Follow-Up:  Follow up is going to depend on next steps with valve replacement.   Other Instructions   [place] OFFICE [place], [place] [address] Dept: [phone] Loc: [place]  05/21/2021  You are scheduled for a Cardiac Catheterization on Friday, August 12 with Dr. Daneen Schick.  1. Please arrive at the [facility] (Main Entrance A) at [facility]: [address] [place], [place] [address] at 5:30 AM (This time is two hours before your procedure to ensure your preparation). Free valet parking service is available.   Special note: Every effort is made to have your procedure done on  time. Please understand that emergencies sometimes delay scheduled procedures.  2. Diet: Do not eat solid foods after midnight.  The patient may have clear liquids until 5am upon the day of the procedure.  3. Labs: You will need to have blood drawn prior to your cath. You do not need to be fasting.  4. Medication instructions in preparation for your procedure:   Contrast Allergy: Yes, Please take Prednisone 41m by mouth at: Thirteen hours prior to cath 6:00pm on Thursday Seven hours prior to cath 12:00am on Friday And prior to leaving home please take last dose of Prednisone 555mand Benadryl 5038my mouth.  Do not take Diabetes Med Glucophage (Metformin) on the day of the procedure and HOLD 48 HOURS AFTER THE PROCEDURE. Do not take your [place] Gladee morning of your test.  On the morning of your procedure, take your Aspirin and any morning medicines NOT listed above.  You may use sips of water.  5. Plan for one night stay--bring personal belongings. 6. Bring a current list of  your medications and current insurance cards. 7. You MUST have a responsible person to drive you home. 8. Someone MUST be with you the first 24 hours after you arrive home or your discharge will be delayed. 9. Please wear clothes that are easy to get on and off and wear slip-on shoes.  Thank you for allowing [place] to care for you!   -- [place] [facility]     Signed, Sinclair Grooms, MD  05/21/2021 5:16 PM    [place]

## 2021-05-21 ENCOUNTER — Ambulatory Visit: Payer: 59 | Admitting: Interventional Cardiology

## 2021-05-21 ENCOUNTER — Other Ambulatory Visit (HOSPITAL_COMMUNITY): Payer: Self-pay

## 2021-05-21 ENCOUNTER — Other Ambulatory Visit: Payer: Self-pay

## 2021-05-21 ENCOUNTER — Encounter: Payer: Self-pay | Admitting: Interventional Cardiology

## 2021-05-21 VITALS — BP 118/70 | HR 83 | Ht 64.0 in | Wt 223.4 lb

## 2021-05-21 DIAGNOSIS — E7800 Pure hypercholesterolemia, unspecified: Secondary | ICD-10-CM

## 2021-05-21 DIAGNOSIS — G459 Transient cerebral ischemic attack, unspecified: Secondary | ICD-10-CM | POA: Diagnosis not present

## 2021-05-21 DIAGNOSIS — Z01818 Encounter for other preprocedural examination: Secondary | ICD-10-CM

## 2021-05-21 DIAGNOSIS — R0989 Other specified symptoms and signs involving the circulatory and respiratory systems: Secondary | ICD-10-CM | POA: Diagnosis not present

## 2021-05-21 DIAGNOSIS — I350 Nonrheumatic aortic (valve) stenosis: Secondary | ICD-10-CM

## 2021-05-21 DIAGNOSIS — Z01810 Encounter for preprocedural cardiovascular examination: Secondary | ICD-10-CM

## 2021-05-21 DIAGNOSIS — R0681 Apnea, not elsewhere classified: Secondary | ICD-10-CM

## 2021-05-21 DIAGNOSIS — I10 Essential (primary) hypertension: Secondary | ICD-10-CM

## 2021-05-21 DIAGNOSIS — I25708 Atherosclerosis of coronary artery bypass graft(s), unspecified, with other forms of angina pectoris: Secondary | ICD-10-CM

## 2021-05-21 MED ORDER — DIPHENHYDRAMINE HCL 25 MG PO TABS
ORAL_TABLET | ORAL | 0 refills | Status: DC
Start: 2021-05-21 — End: 2021-06-11
  Filled 2021-05-21: qty 2, 1d supply, fill #0
  Filled 2021-05-21: qty 1, fill #0

## 2021-05-21 MED ORDER — PREDNISONE 50 MG PO TABS
ORAL_TABLET | ORAL | 0 refills | Status: DC
  Filled 2021-05-21 – 2021-06-10 (×2): qty 3, 1d supply, fill #0

## 2021-05-21 NOTE — Patient Instructions (Signed)
Medication Instructions:  Your physician recommends that you continue on your current medications as directed. Please refer to the Current Medication list given to you today.  *If you need a refill on your cardiac medications before your next appointment, please call your pharmacy*   Lab Work: BMET and CBC prior to cath  If you have labs (blood work) drawn today and your tests are completely normal, you will receive your results only by: [place] (if you have MyChart) OR A paper copy in the mail If you have any lab test that is abnormal or we need to change your treatment, we will call you to review the results.   Testing/Procedures: Your physician has requested that you have a cardiac catheterization. Cardiac catheterization is used to diagnose and/or treat various heart conditions. Doctors may recommend this procedure for a number of different reasons. The most common reason is to evaluate chest pain. Chest pain can be a symptom of coronary artery disease (CAD), and cardiac catheterization can show whether plaque is narrowing or blocking your heart's arteries. This procedure is also used to evaluate the valves, as well as measure the blood flow and oxygen levels in different parts of your heart. For further information please visit HugeFiesta.[email or web address]. Please follow instruction sheet, as given.   Follow-Up:  Follow up is going to depend on next steps with valve replacement.   Other Instructions   [place] OFFICE [place], [place] [address] Dept: [phone] Loc: [place]  05/21/2021  You are scheduled for a Cardiac Catheterization on Friday, August 12 with Dr. Daneen Schick.  1. Please arrive at the [facility] (Main Entrance A) at [facility]: [address] [place], [place] [address] at 5:30 AM (This time is two hours before your procedure to  ensure your preparation). Free valet parking service is available.   Special note: Every effort is made to have your procedure done on time. Please understand that emergencies sometimes delay scheduled procedures.  2. Diet: Do not eat solid foods after midnight.  The patient may have clear liquids until 5am upon the day of the procedure.  3. Labs: You will need to have blood drawn prior to your cath. You do not need to be fasting.  4. Medication instructions in preparation for your procedure:   Contrast Allergy: Yes, Please take Prednisone '50mg'$  by mouth at: Thirteen hours prior to cath 6:00pm on Thursday Seven hours prior to cath 12:00am on Friday And prior to leaving home please take last dose of Prednisone '50mg'$  and Benadryl '50mg'$  by mouth.  Do not take Diabetes Med Glucophage (Metformin) on the day of the procedure and HOLD 48 HOURS AFTER THE PROCEDURE. Do not take your [place] Gl[place] the morning of your test.  On the morning of your procedure, take your Aspirin and any morning medicines NOT listed above.  You may use sips of water.  5. Plan for one night stay--bring personal belongings. 6. Bring a current list of your medications and current insurance cards. 7. You MUST have a responsible person to drive you home. 8. Someone MUST be with you the first 24 hours after you arrive home or your discharge will be delayed. 9. Please wear clothes that are easy to get on and off and wear slip-on shoes.  Thank you for allowing [place] to care for you!   -- [place] [facility]

## 2021-05-25 ENCOUNTER — Other Ambulatory Visit: Payer: Self-pay

## 2021-05-25 ENCOUNTER — Other Ambulatory Visit: Payer: 59 | Admitting: *Deleted

## 2021-05-25 DIAGNOSIS — I350 Nonrheumatic aortic (valve) stenosis: Secondary | ICD-10-CM

## 2021-05-25 LAB — CBC
Hematocrit: 35.7 % (ref 34.0–46.6)
Hemoglobin: 11.3 g/dL (ref 11.1–15.9)
MCH: 27.8 pg (ref 26.6–33.0)
MCHC: 31.7 g/dL (ref 31.5–35.7)
MCV: 88 fL (ref 79–97)
Platelets: 264 10*3/uL (ref 150–450)
RBC: 4.07 x10E6/uL (ref 3.77–5.28)
RDW: 12.3 % (ref 11.7–15.4)
WBC: 5.9 10*3/uL (ref 3.4–10.8)

## 2021-05-25 LAB — BASIC METABOLIC PANEL
BUN/Creatinine Ratio: 13 (ref 12–28)
BUN: 8 mg/dL (ref 8–27)
CO2: 27 mmol/L (ref 20–29)
Calcium: 9.2 mg/dL (ref 8.7–10.3)
Chloride: 105 mmol/L (ref 96–106)
Creatinine, Ser: 0.64 mg/dL (ref 0.57–1.00)
Glucose: 109 mg/dL — ABNORMAL HIGH (ref 65–99)
Potassium: 4.2 mmol/L (ref 3.5–5.2)
Sodium: 143 mmol/L (ref 134–144)
eGFR: 95 mL/min/{1.73_m2} (ref >59)

## 2021-05-31 ENCOUNTER — Other Ambulatory Visit (HOSPITAL_COMMUNITY): Payer: Self-pay

## 2021-06-04 DIAGNOSIS — H2513 Age-related nuclear cataract, bilateral: Secondary | ICD-10-CM | POA: Diagnosis not present

## 2021-06-04 DIAGNOSIS — H25013 Cortical age-related cataract, bilateral: Secondary | ICD-10-CM | POA: Diagnosis not present

## 2021-06-05 ENCOUNTER — Other Ambulatory Visit (HOSPITAL_COMMUNITY): Payer: Self-pay

## 2021-06-05 MED ORDER — DIFLUPREDNATE 0.05 % OP EMUL
OPHTHALMIC | 1 refills | Status: DC
  Filled 2021-06-05: qty 5, 20d supply, fill #0
  Filled 2021-07-07: qty 5, 20d supply, fill #1

## 2021-06-05 MED ORDER — MOXIFLOXACIN HCL 0.5 % OP SOLN
OPHTHALMIC | 1 refills | Status: DC
  Filled 2021-06-05: qty 3, 41d supply, fill #0

## 2021-06-07 ENCOUNTER — Other Ambulatory Visit (HOSPITAL_COMMUNITY): Payer: Self-pay

## 2021-06-08 ENCOUNTER — Other Ambulatory Visit (HOSPITAL_COMMUNITY): Payer: Self-pay

## 2021-06-10 ENCOUNTER — Telehealth: Payer: Self-pay | Admitting: *Deleted

## 2021-06-10 ENCOUNTER — Other Ambulatory Visit (HOSPITAL_COMMUNITY): Payer: Self-pay

## 2021-06-10 NOTE — Telephone Encounter (Signed)
Cardiac catheterization scheduled at [facility] for: Friday June 11, 2021 7:30 [facility] Main Entrance A [facility]) at: 5:30 AM   No solid food after midnight prior to cath, clear liquids until 5 AM day of procedure.  CONTRAST ALLERGY: yes-13 hour Prednisone and Benadryl Prep reviewed with patient: 06/10/21 Prednisone 50 mg 6:30 PM 06/11/21 Prednisone 50 mg 12:30 AM 06/11/21 Prednisone 50 mg and Benadryl 50 mg just prior to leaving home for hospital Do not drive.  Medication instructions: Hold: -Metformin-day of procedure and 48 hours post procedure  Except hold medications AM medications can be taken pre-cath with sips of water including:  aspirin 81 mg  Prednisone 50 mg  Benadryl 50 mg    Confirmed patient has responsible adult to drive home post procedure and be with patient first 24 hours after arriving home.  Patients are allowed one visitor in the waiting room during the time they are at the hospital for their procedure. Both patient and visitor must wear a mask once they enter the hospital.   Patient reports does not currently have any symptoms concerning for COVID-19 and no household members with COVID-19 like illness.      Reviewed procedure/mask/visitor instructions with patient.

## 2021-06-11 ENCOUNTER — Ambulatory Visit (HOSPITAL_COMMUNITY)
Admission: RE | Disposition: A | Discharge status: Home / Self Care | Payer: Self-pay | Source: Home / Self Care | Attending: Interventional Cardiology

## 2021-06-11 ENCOUNTER — Encounter (HOSPITAL_COMMUNITY): Payer: Self-pay | Admitting: Interventional Cardiology

## 2021-06-11 ENCOUNTER — Encounter: Payer: Self-pay | Admitting: *Deleted

## 2021-06-11 ENCOUNTER — Other Ambulatory Visit: Payer: Self-pay

## 2021-06-11 ENCOUNTER — Ambulatory Visit (HOSPITAL_COMMUNITY)
Admission: RE | Admit: 2021-06-11 | Discharge: 2021-06-11 | Disposition: A | Discharge status: Home / Self Care | Payer: 59 | Attending: Interventional Cardiology | Admitting: Interventional Cardiology

## 2021-06-11 DIAGNOSIS — R0681 Apnea, not elsewhere classified: Secondary | ICD-10-CM | POA: Insufficient documentation

## 2021-06-11 DIAGNOSIS — E7800 Pure hypercholesterolemia, unspecified: Secondary | ICD-10-CM | POA: Diagnosis present

## 2021-06-11 DIAGNOSIS — G459 Transient cerebral ischemic attack, unspecified: Secondary | ICD-10-CM | POA: Diagnosis not present

## 2021-06-11 DIAGNOSIS — Z79899 Other long term (current) drug therapy: Secondary | ICD-10-CM | POA: Insufficient documentation

## 2021-06-11 DIAGNOSIS — R0989 Other specified symptoms and signs involving the circulatory and respiratory systems: Secondary | ICD-10-CM | POA: Diagnosis not present

## 2021-06-11 DIAGNOSIS — I350 Nonrheumatic aortic (valve) stenosis: Secondary | ICD-10-CM

## 2021-06-11 DIAGNOSIS — Z881 Allergy status to other antibiotic agents status: Secondary | ICD-10-CM | POA: Diagnosis not present

## 2021-06-11 DIAGNOSIS — Z880 Allergy status to penicillin: Secondary | ICD-10-CM | POA: Diagnosis not present

## 2021-06-11 DIAGNOSIS — Z6838 Body mass index (BMI) 38.0-38.9, adult: Secondary | ICD-10-CM | POA: Insufficient documentation

## 2021-06-11 DIAGNOSIS — I25708 Atherosclerosis of coronary artery bypass graft(s), unspecified, with other forms of angina pectoris: Secondary | ICD-10-CM

## 2021-06-11 DIAGNOSIS — Z888 Allergy status to other drugs, medicaments and biological substances status: Secondary | ICD-10-CM | POA: Diagnosis not present

## 2021-06-11 DIAGNOSIS — Z7982 Long term (current) use of aspirin: Secondary | ICD-10-CM | POA: Diagnosis not present

## 2021-06-11 DIAGNOSIS — I10 Essential (primary) hypertension: Secondary | ICD-10-CM | POA: Diagnosis present

## 2021-06-11 DIAGNOSIS — Z91041 Radiographic dye allergy status: Secondary | ICD-10-CM | POA: Diagnosis not present

## 2021-06-11 DIAGNOSIS — Z7984 Long term (current) use of oral hypoglycemic drugs: Secondary | ICD-10-CM | POA: Diagnosis not present

## 2021-06-11 DIAGNOSIS — I25810 Atherosclerosis of coronary artery bypass graft(s) without angina pectoris: Secondary | ICD-10-CM | POA: Diagnosis present

## 2021-06-11 DIAGNOSIS — Z006 Encounter for examination for normal comparison and control in clinical research program: Secondary | ICD-10-CM

## 2021-06-11 DIAGNOSIS — I25118 Atherosclerotic heart disease of native coronary artery with other forms of angina pectoris: Secondary | ICD-10-CM | POA: Diagnosis not present

## 2021-06-11 DIAGNOSIS — Z951 Presence of aortocoronary bypass graft: Secondary | ICD-10-CM | POA: Insufficient documentation

## 2021-06-11 DIAGNOSIS — I252 Old myocardial infarction: Secondary | ICD-10-CM

## 2021-06-11 DIAGNOSIS — E119 Type 2 diabetes mellitus without complications: Secondary | ICD-10-CM

## 2021-06-11 HISTORY — PX: RIGHT/LEFT HEART CATH AND CORONARY/GRAFT ANGIOGRAPHY: CATH118267

## 2021-06-11 LAB — POCT I-STAT 7, (LYTES, BLD GAS, ICA,H+H)
Acid-Base Excess: 1 mmol/L (ref 0.0–2.0)
Bicarbonate: 26.1 mmol/L (ref 20.0–28.0)
Calcium, Ion: 1.29 mmol/L (ref 1.15–1.40)
HCT: 34 % — ABNORMAL LOW (ref 36.0–46.0)
Hemoglobin: 11.6 g/dL — ABNORMAL LOW (ref 12.0–15.0)
O2 Saturation: 98 %
Potassium: 3.9 mmol/L (ref 3.5–5.1)
Sodium: 141 mmol/L (ref 135–145)
TCO2: 27 mmol/L (ref 22–32)
pCO2 arterial: 41 mmHg (ref 32.0–48.0)
pH, Arterial: 7.411 (ref 7.350–7.450)
pO2, Arterial: 99 mmHg (ref 83.0–108.0)

## 2021-06-11 LAB — POCT I-STAT EG7
Acid-Base Excess: 0 mmol/L (ref 0.0–2.0)
Acid-Base Excess: 0 mmol/L (ref 0.0–2.0)
Bicarbonate: 24.8 mmol/L (ref 20.0–28.0)
Bicarbonate: 25.4 mmol/L (ref 20.0–28.0)
Calcium, Ion: 1.3 mmol/L (ref 1.15–1.40)
Calcium, Ion: 1.31 mmol/L (ref 1.15–1.40)
HCT: 34 % — ABNORMAL LOW (ref 36.0–46.0)
HCT: 34 % — ABNORMAL LOW (ref 36.0–46.0)
Hemoglobin: 11.6 g/dL — ABNORMAL LOW (ref 12.0–15.0)
Hemoglobin: 11.6 g/dL — ABNORMAL LOW (ref 12.0–15.0)
O2 Saturation: 66 %
O2 Saturation: 71 %
Potassium: 3.8 mmol/L (ref 3.5–5.1)
Potassium: 3.8 mmol/L (ref 3.5–5.1)
Sodium: 141 mmol/L (ref 135–145)
Sodium: 142 mmol/L (ref 135–145)
TCO2: 26 mmol/L (ref 22–32)
TCO2: 27 mmol/L (ref 22–32)
pCO2, Ven: 42 mmHg — ABNORMAL LOW (ref 44.0–60.0)
pCO2, Ven: 43.3 mmHg — ABNORMAL LOW (ref 44.0–60.0)
pH, Ven: 7.376 (ref 7.250–7.430)
pH, Ven: 7.379 (ref 7.250–7.430)
pO2, Ven: 35 mmHg (ref 32.0–45.0)
pO2, Ven: 38 mmHg (ref 32.0–45.0)

## 2021-06-11 LAB — GLUCOSE, CAPILLARY: Glucose-Capillary: 160 mg/dL — ABNORMAL HIGH (ref 70–99)

## 2021-06-11 SURGERY — RIGHT/LEFT HEART CATH AND CORONARY/GRAFT ANGIOGRAPHY
Anesthesia: LOCAL

## 2021-06-11 MED ORDER — IOHEXOL 350 MG/ML SOLN
INTRAVENOUS | Status: DC | PRN
  Administered 2021-06-11: 115 mL

## 2021-06-11 MED ORDER — SODIUM CHLORIDE 0.9% FLUSH: 3.0000 mL | Freq: Two times a day (BID) | INTRAVENOUS | Status: DC

## 2021-06-11 MED ORDER — ACETAMINOPHEN 325 MG PO TABS: 650.0000 mg | ORAL_TABLET | ORAL | Status: DC | PRN

## 2021-06-11 MED ORDER — LIDOCAINE HCL (PF) 1 % IJ SOLN
INTRAMUSCULAR | Status: AC
  Filled 2021-06-11: qty 30

## 2021-06-11 MED ORDER — SODIUM CHLORIDE 0.9 % WEIGHT BASED INFUSION: 1.0000 mL/kg/h | INTRAVENOUS | Status: DC

## 2021-06-11 MED ORDER — LABETALOL HCL 5 MG/ML IV SOLN: 10.0000 mg | INTRAVENOUS | Status: DC | PRN

## 2021-06-11 MED ORDER — SODIUM CHLORIDE 0.9 % IV SOLN: 250.0000 mL | INTRAVENOUS | Status: DC | PRN

## 2021-06-11 MED ORDER — VERAPAMIL HCL 2.5 MG/ML IV SOLN
INTRAVENOUS | Status: DC | PRN
  Administered 2021-06-11: 10 mL via INTRA_ARTERIAL

## 2021-06-11 MED ORDER — HYDRALAZINE HCL 20 MG/ML IJ SOLN: 10.0000 mg | INTRAMUSCULAR | Status: DC | PRN

## 2021-06-11 MED ORDER — LIDOCAINE HCL (PF) 1 % IJ SOLN
INTRAMUSCULAR | Status: DC | PRN
  Administered 2021-06-11: 4 mL

## 2021-06-11 MED ORDER — SODIUM CHLORIDE 0.9 % IV SOLN: INTRAVENOUS | Status: DC

## 2021-06-11 MED ORDER — VERAPAMIL HCL 2.5 MG/ML IV SOLN
INTRAVENOUS | Status: AC
  Filled 2021-06-11: qty 2

## 2021-06-11 MED ORDER — SODIUM CHLORIDE 0.9% FLUSH: 3.0000 mL | INTRAVENOUS | Status: DC | PRN

## 2021-06-11 MED ORDER — HEPARIN SODIUM (PORCINE) 1000 UNIT/ML IJ SOLN
INTRAMUSCULAR | Status: DC | PRN
  Administered 2021-06-11: 5000 [IU] via INTRAVENOUS

## 2021-06-11 MED ORDER — FENTANYL CITRATE (PF) 100 MCG/2ML IJ SOLN
INTRAMUSCULAR | Status: DC | PRN
  Administered 2021-06-11: 50 ug via INTRAVENOUS
  Administered 2021-06-11: 25 ug via INTRAVENOUS

## 2021-06-11 MED ORDER — FENTANYL CITRATE (PF) 100 MCG/2ML IJ SOLN
INTRAMUSCULAR | Status: AC
  Filled 2021-06-11: qty 2

## 2021-06-11 MED ORDER — OXYCODONE HCL 5 MG PO TABS: 5.0000 mg | ORAL_TABLET | ORAL | Status: DC | PRN

## 2021-06-11 MED ORDER — HEPARIN SODIUM (PORCINE) 1000 UNIT/ML IJ SOLN
INTRAMUSCULAR | Status: AC
  Filled 2021-06-11: qty 1

## 2021-06-11 MED ORDER — ONDANSETRON HCL 4 MG/2ML IJ SOLN: 4.0000 mg | Freq: Four times a day (QID) | INTRAMUSCULAR | Status: DC | PRN

## 2021-06-11 MED ORDER — SODIUM CHLORIDE 0.9 % WEIGHT BASED INFUSION: 3.0000 mL/kg/h | INTRAVENOUS | Status: DC

## 2021-06-11 MED ORDER — HEPARIN (PORCINE) IN NACL 1000-0.9 UT/500ML-% IV SOLN
INTRAVENOUS | Status: DC | PRN
  Administered 2021-06-11 (×2): 500 mL

## 2021-06-11 MED ORDER — MIDAZOLAM HCL 2 MG/2ML IJ SOLN
INTRAMUSCULAR | Status: AC
  Filled 2021-06-11: qty 2

## 2021-06-11 MED ORDER — ASPIRIN 81 MG PO CHEW: 81.0000 mg | CHEWABLE_TABLET | ORAL | Status: DC

## 2021-06-11 MED ORDER — MIDAZOLAM HCL 2 MG/2ML IJ SOLN
INTRAMUSCULAR | Status: DC | PRN
  Administered 2021-06-11: 0.5 mg via INTRAVENOUS
  Administered 2021-06-11: 1 mg via INTRAVENOUS

## 2021-06-11 SURGICAL SUPPLY — 14 items
CATH 5FR JL3.5 JR4 ANG PIG MP (CATHETERS) ×1 IMPLANT
CATH BALLN WEDGE 5F 110CM (CATHETERS) ×1 IMPLANT
CATH INFINITI 5FR AL1 (CATHETERS) ×1 IMPLANT
CATH INFINITI 5FR MPB2 (CATHETERS) ×1 IMPLANT
DEVICE RAD COMP TR BAND LRG (VASCULAR PRODUCTS) ×1 IMPLANT
GLIDESHEATH SLEND A-KIT 6F 22G (SHEATH) ×1 IMPLANT
GUIDEWIRE INQWIRE 1.5J.035X260 (WIRE) IMPLANT
INQWIRE 1.5J .035X260CM (WIRE) ×2
KIT HEART LEFT (KITS) ×2 IMPLANT
PACK CARDIAC CATHETERIZATION (CUSTOM PROCEDURE TRAY) ×2 IMPLANT
SHEATH GLIDE SLENDER 4/5FR (SHEATH) ×1 IMPLANT
TRANSDUCER W/STOPCOCK (MISCELLANEOUS) ×2 IMPLANT
TUBING CIL FLEX 10 FLL-RA (TUBING) ×2 IMPLANT
WIRE EMERALD ST .035X150CM (WIRE) ×1 IMPLANT

## 2021-06-11 NOTE — Research (Signed)
IDENTIFY PHDEV Informed Consent                  Subject Name:  Deanna Simmons   Subject met inclusion and exclusion criteria.  The informed consent form, study requirements and expectations were reviewed with the subject and questions and concerns were addressed prior to the signing of the consent form.  The subject verbalized understanding of the trial requirements.  The subject agreed to participate in the [place] trial and signed the informed consent.  The informed consent was obtained prior to performance of any protocol-specific procedures for the subject.  A copy of the signed informed consent was given to the subject and a copy was placed in the subject's medical record. Patient signed consent on 06-11-2021 at 06:55 a.m.    [place] Corvette Orser, Naval architect

## 2021-06-11 NOTE — CV Procedure (Signed)
50 to 70% ostial to proximal saphenous vein graft RCA. Widely patent left main, LAD, and circumflex. Severe aortic stenosis. Mild to moderate pulmonary hypertension, transmitral valve gradient, V wave to 47 mmHg. Heavily calcified aortic valve. Documented normal LV function by echo.

## 2021-06-11 NOTE — Interval H&P Note (Signed)
Cath Lab Visit (complete for each Cath Lab visit)  Clinical Evaluation Leading to the Procedure:   ACS: No.  Non-ACS:    Anginal Classification: CCS III  Anti-ischemic medical therapy: Maximal Therapy (2 or more classes of medications)  Non-Invasive Test Results: High-risk stress test findings: cardiac mortality >3%/year  Prior CABG: Previous CABG      History and Physical Interval Note:  06/11/2021 7:30 AM  Deanna Simmons  has presented today for surgery, with the diagnosis of aortic stenosis.  The various methods of treatment have been discussed with the patient and family. After consideration of risks, benefits and other options for treatment, the patient has consented to  Procedure(s): RIGHT/LEFT HEART CATH AND CORONARY/GRAFT ANGIOGRAPHY (N/A) as a surgical intervention.  The patient's history has been reviewed, patient examined, no change in status, stable for surgery.  I have reviewed the patient's chart and labs.  Questions were answered to the patient's satisfaction.     Belva Crome III

## 2021-06-14 ENCOUNTER — Telehealth: Payer: Self-pay | Admitting: Interventional Cardiology

## 2021-06-14 MED FILL — Lidocaine HCl Local Preservative Free (PF) Inj 1%: INTRAMUSCULAR | Qty: 30 | Status: AC

## 2021-06-14 NOTE — Telephone Encounter (Signed)
Spoke with pt and advised she is to take both medications.  Pt agreeable to plan and appreciative for call.

## 2021-06-14 NOTE — Telephone Encounter (Signed)
Pt c/o medication issue:  1. Name of Medication:  dapagliflozin propanediol (FARXIGA) 10 MG TABS tablet metFORMIN (GLUCOPHAGE-XR) 500 MG 24 hr tablet  2. How are you currently taking this medication (dosage and times per day)?   3. Are you having a reaction (difficulty breathing--STAT)?   4. What is your medication issue?   Patient had a heart cath on 06/11/21 with Dr. Tamala Julian. When discharged she was advised to go back on Farxiga and Metformin. She would like to know if she needs to take both medications. Discharge summary advised her to follow up with PCP for advisement regarding medication, but she states she PCP is on vacation until next week.

## 2021-06-17 ENCOUNTER — Ambulatory Visit: Payer: 59 | Admitting: Cardiovascular Disease

## 2021-06-17 ENCOUNTER — Other Ambulatory Visit: Payer: Self-pay

## 2021-06-17 ENCOUNTER — Encounter: Payer: Self-pay | Admitting: Cardiovascular Disease

## 2021-06-17 VITALS — BP 116/62 | HR 92 | Ht 65.0 in | Wt 218.8 lb

## 2021-06-17 DIAGNOSIS — I350 Nonrheumatic aortic (valve) stenosis: Secondary | ICD-10-CM | POA: Diagnosis not present

## 2021-06-17 NOTE — Progress Notes (Signed)
[facility]   MULTIDISCIPLINARY HEART VALVE TEAM  Date:  06/18/2021   ID:  Deanna Simmons, DOB 1951/05/18, MRN [id number]  PCP:  Josetta Huddle, MD   Chief Complaint  Patient presents with   Shortness of Breath   HISTORY OF PRESENT ILLNESS: Deanna Simmons is a 70 y.o. female who presents for evaluation of aortic stenosis, referred by Dr Tamala Julian.  The patient has a history of coronary artery disease status post remote CABG in 2004 with a saphenous vein graft to PDA at that time.  Comorbid conditions include hypertension, asthma, type 2 diabetes, and prior stroke with right retinal artery occlusion and more recent TIA in June 2022.  The patient is here alone today. She works in [employer] at [employer]. She is up on her feet most of the day. She is not limited by chest pain or shortness of breath, but she complains of feeling very tired at the end of her work day.  She denies orthopnea, PND, or heart palpitations.  She is married with 4 children and 8 grandchildren. She quit smoking many years ago. The patient has had regular dental care and reports a partial in the bottom and an upper plate with no current dental complaints.   The patient recently had an echocardiogram demonstrating progression of aortic stenosis with the maximum transaortic velocity of 4.06 m/s, mean gradient 38 mmHg, and severe thickening and calcification of the aortic valve.  Cardiac catheterization confirmed severe aortic stenosis with a mean transvalvular gradient of 36 mmHg and calculated aortic valve area of 0.9 cm.  The patient also had a large V wave on her pulmonary capillary wedge tracing concerning for significant mitral regurgitation.  Coronary angiography demonstrated patency of the left main, LAD, and left circumflex.  The right coronary artery is totally occluded in the saphenous vein graft to distal RCA is patent with moderate proximal narrowing identified.  Past Medical History:   Diagnosis Date   Abnormal finding on Pap smear, ASCUS    Anemia    Asthma    Chondromalacia of knee    Coronary artery disease    Diabetes mellitus without complication ([place])    Diverticulum 12/11/2002   in cecum   Fibroids    h/o   Glucose intolerance (impaired glucose tolerance)    H/O elevated lipids    H/O tinea cruris 02/2010   History of ASCVD    s/p CABG   Hypercholesteremia    Hypertension    Internal hemorrhoids    h/o   Irregular menses    h/o   MI (myocardial infarction) ([place]) age 17   Obesity    Pneumonia 07/2008   Synovitis of knee    Systemic lupus erythematosus ([facility])    with nephritis   Vulvitis    h/o    Current Outpatient Medications  Medication Sig Dispense Refill   albuterol (VENTOLIN HFA) 108 (90 Base) MCG/ACT inhaler INHALE 2 PUFFS BY MOUTH EVERY 4-6 HOURS AS NEEDED FOR COUGH OR WHEEZE 18 g 1   ALPRAZolam (XANAX) 0.25 MG tablet Take 1 tablet (0.25 mg total) by mouth 2 (two) times daily as needed for anxiety 30 tablet 1   aspirin 81 MG tablet Take 81 mg by mouth in the morning and at bedtime.     beclomethasone (QVAR REDIHALER) 80 MCG/ACT inhaler Use 3 puffs 3 times daily during asthma flares. 10.6 g 5   Blood Glucose Monitoring Suppl (FREESTYLE LITE) w/Device KIT USE TO TEST TWO  TIMES DAILY PRIOR TO MEALS 1 kit 3   cetirizine (ZYRTEC) 10 MG tablet Take 10 mg by mouth daily.     Cholecalciferol (VITAMIN D3) 2000 UNITS TABS Take 1 tablet by mouth daily. With Vitamin K2 in it     ciclopirox (PENLAC) 8 % solution APPLY TO THE AFFECTED AREA(S) AT BEDTIME AND EVERY 7 DAYS CLEAN NAILS WITH ALCOHOL 13.2 mL 1   dapagliflozin propanediol (FARXIGA) 10 MG TABS tablet Take 1 tablet by mouth once a day 90 tablet 3   Dextromethorphan-Guaifenesin (MUCINEX DM PO) Take 1 tablet by mouth as needed (colds).     Difluprednate (DUREZOL) 0.05 % EMUL Start 2 days prior to surgery. use 1 drop in right eye 4 times daily. use as directed after surgery 5 mL 1   Estradiol 10 MCG  TABS vaginal tablet INSERT 1 TABLET TWICE A WEEK VAGINALLY AS DIRECTED 8 tablet 12   ezetimibe (ZETIA) 10 MG tablet Take 1 tablet (10 mg total) by mouth daily. Pt must keep upcoming appt in July for further refills 90 tablet 0   famotidine (PEPCID) 40 MG tablet TAKE ONE TABLET BY MOUTH ONCE DAILY. INCREASE TO ONE TABLET TWICE DAILY DURING FLARE-UP. 60 tablet 5   ferrous sulfate 325 (65 FE) MG tablet Take 325 mg by mouth every other day.     fluticasone (FLONASE) 50 MCG/ACT nasal spray INSTILL 1 TO 2 SPRAYS INTO EACH NOSTRIL DAILY FOR STUFFY NOSE OR DRAINAGE (Patient taking differently: Place 2 sprays into both nostrils daily as needed for allergies.) 48 g 5   glucose blood test strip USE TO TEST TWICE A DAY BEFORE MEALS 200 strip 3   glucose blood test strip USE WHEN CHECKING BLOOD SUGAR TWICE DAILY PRIOR TO MEALS 200 strip 3   Lancets (FREESTYLE) lancets USE TO TEST TWO TIMES DAILY PRIOR TO MEALS 200 each 3   magnesium gluconate (MAGONATE) 500 MG tablet Take 500 mg by mouth daily as needed (cramps).     metFORMIN (GLUCOPHAGE-XR) 500 MG 24 hr tablet TAKE 1 TABLET BY MOUTH TWICE DAILY WITH A MEAL 180 tablet 3   metoprolol succinate (TOPROL-XL) 25 MG 24 hr tablet TAKE 1 TABLET BY MOUTH TWICE DAILY 180 tablet 3   moxifloxacin (VIGAMOX) 0.5 % ophthalmic solution instil 1 drop in right eye 4 times a day starting 2 days prior to surgery & 2 drops morning of surgery 3 mL 1   Multiple Vitamins-Minerals (MULTIVITAL PO) Take 1 tablet by mouth daily. Anti ox     Probiotic Product (PROBIOTIC DAILY PO) Take 1 capsule by mouth daily. Ultra Flora IB     rosuvastatin (CRESTOR) 40 MG tablet Take 1 tablet (40 mg total) by mouth daily. Please keep upcoming appt in December with Dr. Tamala Julian for future refills. Thank you 90 tablet 0   TRUE METRIX BLOOD GLUCOSE TEST test strip   3   TRUEPLUS LANCETS 30G MISC   3   valACYclovir (VALTREX) 500 MG tablet Take 500 mg by mouth daily as needed (cold sores/blisters). Reported on  12/01/2015     vitamin C (ASCORBIC ACID) 500 MG tablet Take 500 mg by mouth daily.     zinc gluconate 50 MG tablet Take 50 mg by mouth daily.     ADVAIR DISKUS 250-50 MCG/DOSE AEPB INHALE 1 PUFF INTO THE LUNGS IN THE MORNING AND AT BEDTIME. 60 each 5   predniSONE (DELTASONE) 50 MG tablet Take 1 tablet (50 mg) 13 hours, 7 hours, and 1 hour prior  to your CT scans. 3 tablet 0   No current facility-administered medications for this visit.    ALLERGIES:   Biaxin [clarithromycin], Cardiolite [technetium-42m, Ciprofloxacin, Erythromycin base, Montelukast sodium, Niaspan [niacin er], Penicillins, Prilosec [omeprazole], Singulair [montelukast], Ticlid [ticlopidine hcl], and Iodinated diagnostic agents   SOCIAL HISTORY:  The patient  reports that she has quit smoking. She has never used smokeless tobacco. She reports current alcohol use. She reports that she does not use drugs.   FAMILY HISTORY:  The patient's family history includes Breast cancer (age of onset: 729 in her mother; Diabetes in her father and mother; Heart attack in her paternal uncle; Hyperlipidemia in her father; Ovarian cancer in her cousin; Prostate cancer in her father; Stroke in her mother.   REVIEW OF SYSTEMS:  Positive for fatigue.   All other systems are reviewed and negative.   PHYSICAL EXAM: VS:  BP 116/62   Pulse 92   Ht _0  (1.651 m)   Wt 218 lb 12.8 oz (99.2 kg)   SpO2 97%   BMI 36.41 kg/m  , BMI Body mass index is 36.41 kg/m. GEN: Well nourished, well developed, in no acute distress HEENT: normal Neck: No JVD. carotids 2+ without bruits or masses Cardiac: The heart is RRR with a 3/6 harsh mid peaking crescendo decrescendo murmur at the right upper sternal border with diminished A2.  No edema. Pedal pulses 2+ = bilaterally  Respiratory:  clear to auscultation bilaterally GI: soft, nontender, nondistended, + BS MS: no deformity or atrophy Skin: warm and dry, no rash Neuro:  Strength and sensation are  intact Psych: euthymic mood, full affect  EKG:  EKG from 06/11/2021 reviewed and demonstrates normal sinus rhythm 61 bpm, left atrial enlargement, otherwise normal  RECENT LABS: 04/13/2021: ALT 25 05/25/2021: BUN 8; Creatinine, Ser 0.64; Platelets 264 06/11/2021: Hemoglobin 11.6; Potassium 3.9; Sodium 141  No results found for requested labs within last 8760 hours.   CrCl cannot be calculated (Patient's most recent lab result is older than the maximum 21 days allowed.).   Wt Readings from Last 3 Encounters:  06/17/21 218 lb 12.8 oz (99.2 kg)  06/11/21 220 lb (99.8 kg)  05/21/21 223 lb 6.4 oz (101.3 kg)     CARDIAC STUDIES: Echo:  IMPRESSIONS     1. Severe aortic stenosis is present. V max 4.06 m/s, MG 38 mmHG. AVA  over estimated (1.1 cm2) as LVOT PW signal obtained too close to the valve  (LVOT VTI 39 cm). Based on Vmax and mean gradient, aortic stenosis is  severe. There is mild to moderate  aortic regurgitation, but this is not well visualized. The aortic valve is  tricuspid. There is severe calcifcation of the aortic valve. There is  severe thickening of the aortic valve. Aortic valve regurgitation is mild  to moderate. Severe aortic valve  stenosis.   2. Left ventricular ejection fraction, by estimation, is 55 to 60%. Left  ventricular ejection fraction by 3D volume is 57 %. The left ventricle has  normal function. The left ventricle has no regional wall motion  abnormalities. Left ventricular diastolic   function could not be evaluated. The average left ventricular global  longitudinal strain is -22.2 %. The global longitudinal strain is normal.   3. Right ventricular systolic function is normal. The right ventricular  size is normal. There is normal pulmonary artery systolic pressure. The  estimated right ventricular systolic pressure is 389.3mmHg.   4. Left atrial size was mild to moderately dilated.  5. Mild calcific mitral stenosis. MVA by VTI 2.04 cm2. MG 4 mmHG @  64  bpm. The mitral valve is degenerative. Mild mitral valve regurgitation.  Mild mitral stenosis. The mean mitral valve gradient is 4.0 mmHg. Moderate  to severe mitral annular  calcification.   6. The inferior vena cava is normal in size with greater than 50%  respiratory variability, suggesting right atrial pressure of 3 mmHg.   Comparison(s): Changes from prior study are noted. Aortic stenosis is now  severe.  LEFT VENTRICLE  PLAX 2D  LVIDd:         5.20 cm         Diastology  LVIDs:         3.20 cm         LV e' medial:    4.47 cm/s  LV PW:         1.00 cm         LV E/e' medial:  31.1  LV IVS:        1.00 cm         LV e' lateral:   4.12 cm/s  LVOT diam:     2.00 cm         LV E/e' lateral: 33.7  LV SV:         123  LV SV Index:   60              2D  LVOT Area:     3.14 cm        Longitudinal                                 Strain                                 2D Strain GLS  -19.3 %                                 (A2C):                                 2D Strain GLS  -22.5 %                                 (A3C):                                 2D Strain GLS  -24.9 %                                 (A4C):                                 2D Strain GLS  -22.2 %                                 Avg:  3D Volume EF                                 LV 3D EF:    Left                                              ventricular                                              ejection                                              fraction by                                              3D volume                                              is 57 %.                                    3D Volume EF:                                 3D EF:        57 %                                 LV EDV:       166 ml                                 LV ESV:       71 ml                                 LV SV:        95 ml   RIGHT VENTRICLE  RV Basal diam:  3.40 cm  RV  S prime:     8.25 cm/s  TAPSE (M-mode): 2.0 cm  RVSP:           32.8 mmHg   LEFT ATRIUM              Index       RIGHT ATRIUM           Index  LA diam:        4.80 cm  2.35 cm/m  RA Pressure: 3.00 mmHg  LA Vol (A2C):   62.6 ml  30.65 ml/m RA Area:     12.60  cm  LA Vol (A4C):   102.0 ml 49.93 ml/m RA Volume:   28.30 ml  13.85 ml/m  LA Biplane Vol: 83.8 ml  41.02 ml/m   AORTIC VALVE  AV Area (Vmax):    1.08 cm  AV Area (Vmean):   1.15 cm  AV Area (VTI):     1.11 cm  AV Vmax:           406.00 cm/s  AV Vmean:          280.667 cm/s  AV VTI:            1.100 m  AV Peak Grad:      65.9 mmHg  AV Mean Grad:      38.0 mmHg  LVOT Vmax:         139.00 cm/s  LVOT Vmean:        103.000 cm/s  LVOT VTI:          0.390 m  LVOT/AV VTI ratio: 0.35  AI PHT:            295 msec     AORTA  Ao Root diam: 3.20 cm  Ao Asc diam:  3.50 cm   MITRAL VALVE                TRICUSPID VALVE                              TR Peak grad:   29.8 mmHg                              TR Vmax:        273.00 cm/s  MV Peak grad:  11.7 mmHg    Estimated RAP:  3.00 mmHg  MV Mean grad:  4.0 mmHg     RVSP:           32.8 mmHg  MV Vmax:       1.71 m/s  MV Vmean:      97.9 cm/s    SHUNTS  MV VTI:        0.57 m       Systemic VTI:  0.39 m  MV Decel Time: 370 msec     Systemic Diam: 2.00 cm  MV E velocity: 139.00 cm/s  MV A velocity: 163.00 cm/s  MV E/A ratio:  0.85   Cardiac Cath:  Conclusion      Ost RCA to Mid RCA lesion is 100% stenosed.   Prox Cx lesion is 30% stenosed.   Mid LM to Dist LM lesion is 20% stenosed.   Ost LAD lesion is 20% stenosed.   Origin lesion is 60% stenosed.   LV end diastolic pressure is moderately elevated.   LV end diastolic pressure is normal.   Hemodynamic findings consistent with moderate pulmonary hypertension.   There is severe aortic valve stenosis.   There is moderate mitral valve stenosis and mild (2+) mitral regurgitation.   Severe aortic stenosis with mean gradient 36  mmHg and calculated aortic valve area 0.91 cm. 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  V wave 47 mmHg.  A simultaneous gradient across the mitral valve was not recorded (however, mean wedge pressure of 28 mmHg minus LVEDP 17 mmHg = 11 mmHg, likely overestimating gradient due to V wave). Total occlusion of RCA. Patent left  main Patent LAD Patent circumflex Patent 71 year old SVG to distal RCA with ostial proximal 50 to 70% narrowing.   RECOMMENDATIONS:   Referred to the structural heart team for consideration of TAVR and opinion concerning mitral valve. Diagnostic Dominance: Right Left Main  Mid LM to Dist LM lesion is 20% stenosed.  Left Anterior Descending  The vessel exhibits minimal luminal irregularities.  Ost LAD lesion is 20% stenosed.  Left Circumflex  Prox Cx lesion is 30% stenosed.  Right Coronary Artery  The vessel exhibits minimal luminal irregularities.  Ost RCA to Mid RCA lesion is 100% stenosed. The lesion was previously treated .  Right Posterior Descending Artery  The vessel exhibits minimal luminal irregularities.  Graft To RPDA  Origin lesion is 60% stenosed.  Intervention  No interventions have been documented. Right Heart  Right Heart Pressures Hemodynamic findings consistent with moderate pulmonary hypertension. LV EDP is normal.  Right Atrium Right atrial pressure is normal.   Left Heart  Left Ventricle LV end diastolic pressure is moderately elevated.  Mitral Valve There is moderate mitral valve stenosis and mild (2+) mitral regurgitation. Heavy mitral annulus calcification  Aortic Valve There is severe aortic valve stenosis. The aortic valve is calcified. There is restricted aortic valve motion.   Coronary Diagrams   Diagnostic Dominance: Right    Intervention    Implants     No implant documentation for this case.   Syngo Images   Show images for  CARDIAC CATHETERIZATION Images on Long Term Storage   Show images for Kieren, Ricci to Procedure Log  Procedure Log    Hemo Data  Flowsheet Row Most Recent Value  Fick Cardiac Output 5.88 L/min  Fick Cardiac Output Index 2.88 (L/min)/BSA  Aortic Mean Gradient 36.07 mmHg  Aortic Peak Gradient 36 mmHg  Aortic Valve Area 0.91  Aortic Value Area Index 0.45 cm2/BSA  RA A Wave 9 mmHg  RA V Wave 8 mmHg  RA Mean 5 mmHg  RV Systolic Pressure 44 mmHg  RV Diastolic Pressure 1 mmHg  RV EDP 8 mmHg  PA Systolic Pressure 45 mmHg  PA Diastolic Pressure 21 mmHg  PA Mean 35 mmHg  PW A Wave 29 mmHg  PW V Wave 47 mmHg  PW Mean 28 mmHg  AO Systolic Pressure 109 mmHg  AO Diastolic Pressure 63 mmHg  AO Mean 94 mmHg  LV Systolic Pressure 323 mmHg  LV Diastolic Pressure 1 mmHg  LV EDP 17 mmHg  AOp Systolic Pressure 557 mmHg  AOp Diastolic Pressure 64 mmHg  AOp Mean Pressure 99 mmHg  LVp Systolic Pressure 322 mmHg  LVp Diastolic Pressure 2 mmHg  LVp EDP Pressure 17 mmHg  QP/QS 1  TPVR Index 12.12 HRUI  TSVR Index 32.56 HRUI  PVR SVR Ratio 0.08  TPVR/TSVR Ratio 0.37    STS RISK CALCULATOR: Procedure: AVR + CAB Risk of Mortality: 5.628% Renal Failure: 2.815% Permanent Stroke: 3.693% Prolonged Ventilation: 17.540% DSW Infection: 0.358% Reoperation: 3.745% Morbidity or Mortality: 22.681% Short Length of Stay: 12.952% Long Length of Stay: 19.410%  ASSESSMENT AND PLAN: 70 year old woman with history of remote single-vessel CABG, now with severe aortic stenosis associated with mild fatigue.  I have reviewed the natural history of aortic stenosis with the patient and their family members who are present today (daughter via telephone). We have discussed the limitations of medical therapy and the poor prognosis associated with symptomatic aortic stenosis. We have reviewed potential treatment options, including palliative medical therapy, conventional surgical aortic valve  replacement, and transcatheter aortic valve replacement. We discussed treatment options in the context of the patient's specific comorbid medical conditions.    The patient's echocardiogram and cardiac catheterization films were personally reviewed.  Her echocardiogram demonstrates severe thickening, calcification, and restriction of the aortic valve leaflets with Doppler findings consistent with severe aortic stenosis (peak velocity greater than 4 m/s, mean gradient 38 mmHg).  There is calcification of the mitral annulus with mild mitral stenosis and mild mitral regurgitation.  Cardiac catheterization demonstrates single-vessel coronary artery disease with total occlusion of the RCA and continued patency of the saphenous vein graft RCA with moderate stenosis in the proximal body of the graft.  Invasive hemodynamics confirm severe aortic stenosis with a mean gradient of 36 mmHg and calculated valve area of 0.9 cm.  While the patient's symptoms are mild, it seems appropriate to treat her in the presence of progressive and now severe aortic stenosis.  She will be referred for a gated cardiac CTA as well as a CTA of the chest, abdomen, and pelvis.  This will evaluate for anatomic feasibility of TAVR in this patient who is functionally independent but has had prior cardiac surgery.  Once her CTA studies are completed, she will be referred for formal cardiac surgical consultation as part of a multidisciplinary approach to her care.    Deatra James 06/18/2021 5:33 PM     [place] [address]  [phone] (office) [phone] (fax)

## 2021-06-17 NOTE — Patient Instructions (Signed)
Medication Instructions:  *If you need a refill on your cardiac medications before your next appointment, please call your pharmacy*  Lab Work: If you have labs (blood work) drawn today and your tests are completely normal, you will receive your results only by: [place] (if you have MyChart) OR A paper copy in the mail If you have any lab test that is abnormal or we need to change your treatment, we will call you to review the results.  Testing/Procedures: Your physician has requested that you have cardiac CT for TAVR. Cardiac computed tomography (CT) is a painless test that uses an x-ray machine to take clear, detailed pictures of your heart. For further information please visit [email or web address]. Please follow instruction sheet as given.  Follow-Up: At [facility], you and your health needs are our priority.  As part of our continuing mission to provide you with exceptional heart care, we have created designated Provider Care Teams.  These Care Teams include your primary Cardiologist (physician) and Advanced Practice Providers (APPs -  Physician Assistants and Nurse Practitioners) who all work together to provide you with the care you need, when you need it.  We recommend signing up for the patient portal called "MyChart".  Sign up information is provided on this After Visit Summary.  MyChart is used to connect with patients for Virtual Visits (Telemedicine).  Patients are able to view lab/test results, encounter notes, upcoming appointments, etc.  Non-urgent messages can be sent to your provider as well.   To learn more about what you can do with MyChart, go to [email or web address].    Your next appointment:   Someone from our team will give you a call to schedule.  You have been referred to Dr. Cyndia Bent for consult.

## 2021-06-18 ENCOUNTER — Other Ambulatory Visit (HOSPITAL_COMMUNITY): Payer: Self-pay

## 2021-06-18 ENCOUNTER — Encounter: Payer: Self-pay | Admitting: Cardiovascular Disease

## 2021-06-18 ENCOUNTER — Other Ambulatory Visit: Payer: Self-pay

## 2021-06-18 ENCOUNTER — Telehealth: Payer: Self-pay | Admitting: *Deleted

## 2021-06-18 DIAGNOSIS — I350 Nonrheumatic aortic (valve) stenosis: Secondary | ICD-10-CM

## 2021-06-18 MED ORDER — PREDNISONE 50 MG PO TABS
ORAL_TABLET | ORAL | 0 refills | Status: DC
  Filled 2021-06-18: qty 3, 1d supply, fill #0

## 2021-06-18 NOTE — Telephone Encounter (Signed)
-----   Message from Theodoro Parma, RN sent at 06/18/2021 11:55 AM EDT ----- Regarding: TAVR scans 8/30 TAVR scans scheduled 8/30 for WO to read Dx: AS  Deanna Simmons- she has contrast allergy and premeds have been ordered.  Thanks! KK

## 2021-06-21 ENCOUNTER — Other Ambulatory Visit: Payer: Self-pay

## 2021-06-21 DIAGNOSIS — I350 Nonrheumatic aortic (valve) stenosis: Secondary | ICD-10-CM

## 2021-06-22 ENCOUNTER — Telehealth: Payer: Self-pay

## 2021-06-22 ENCOUNTER — Other Ambulatory Visit (HOSPITAL_COMMUNITY): Payer: Self-pay

## 2021-06-22 DIAGNOSIS — H25811 Combined forms of age-related cataract, right eye: Secondary | ICD-10-CM | POA: Diagnosis not present

## 2021-06-22 DIAGNOSIS — H25011 Cortical age-related cataract, right eye: Secondary | ICD-10-CM | POA: Diagnosis not present

## 2021-06-22 DIAGNOSIS — H2511 Age-related nuclear cataract, right eye: Secondary | ICD-10-CM | POA: Diagnosis not present

## 2021-06-22 MED ORDER — ALBUTEROL SULFATE HFA 108 (90 BASE) MCG/ACT IN AERS
2.0000 | INHALATION_SPRAY | RESPIRATORY_TRACT | 0 refills | Status: DC | PRN
  Filled 2021-06-22: qty 18, 17d supply, fill #0

## 2021-06-22 NOTE — Telephone Encounter (Signed)
Patient called requesting refills for her Proventil. Patient is over due for a follow up visit and she only wants to see Dr Neldon Mc. I scheduled her for the next available afternoon appt on 08/31/21. Patient is wondering if we could send in a courtesy refill on the Proventil.  [place]

## 2021-06-22 NOTE — Telephone Encounter (Signed)
Spoke with patient, informed her that a courtesy refill has been sent to the requested pharmacy and asked that she keep her appointment for further refills. Patient verbalized understanding.

## 2021-06-24 ENCOUNTER — Other Ambulatory Visit: Payer: Self-pay

## 2021-06-24 ENCOUNTER — Other Ambulatory Visit: Payer: 59 | Admitting: *Deleted

## 2021-06-24 DIAGNOSIS — I350 Nonrheumatic aortic (valve) stenosis: Secondary | ICD-10-CM | POA: Diagnosis not present

## 2021-06-24 LAB — BASIC METABOLIC PANEL
BUN/Creatinine Ratio: 16 (ref 12–28)
BUN: 11 mg/dL (ref 8–27)
CO2: 24 mmol/L (ref 20–29)
Calcium: 9.8 mg/dL (ref 8.7–10.3)
Chloride: 106 mmol/L (ref 96–106)
Creatinine, Ser: 0.7 mg/dL (ref 0.57–1.00)
Glucose: 130 mg/dL — ABNORMAL HIGH (ref 65–99)
Potassium: 4 mmol/L (ref 3.5–5.2)
Sodium: 143 mmol/L (ref 134–144)
eGFR: 93 mL/min/{1.73_m2} (ref >59)

## 2021-06-29 ENCOUNTER — Ambulatory Visit (HOSPITAL_COMMUNITY)
Admission: RE | Admit: 2021-06-29 | Discharge: 2021-06-29 | Disposition: A | Discharge status: Home / Self Care | Payer: 59 | Source: Ambulatory Visit | Attending: Cardiovascular Disease | Admitting: Cardiovascular Disease

## 2021-06-29 ENCOUNTER — Other Ambulatory Visit: Payer: Self-pay

## 2021-06-29 DIAGNOSIS — I350 Nonrheumatic aortic (valve) stenosis: Secondary | ICD-10-CM | POA: Diagnosis not present

## 2021-06-29 DIAGNOSIS — K5790 Diverticulosis of intestine, part unspecified, without perforation or abscess without bleeding: Secondary | ICD-10-CM | POA: Diagnosis not present

## 2021-06-29 DIAGNOSIS — I700 Atherosclerosis of aorta: Secondary | ICD-10-CM | POA: Diagnosis not present

## 2021-06-29 MED ORDER — IOHEXOL 350 MG/ML SOLN
100.0000 mL | Freq: Once | INTRAVENOUS | Status: AC | PRN
  Administered 2021-06-29: 100 mL via INTRAVENOUS

## 2021-06-29 MED ORDER — METOPROLOL TARTRATE 5 MG/5ML IV SOLN
INTRAVENOUS | Status: AC
  Administered 2021-06-29: 5 mg via INTRAVENOUS
  Filled 2021-06-29: qty 10

## 2021-06-29 MED ORDER — METOPROLOL TARTRATE 5 MG/5ML IV SOLN: 5.0000 mg | INTRAVENOUS | Status: DC | PRN

## 2021-06-30 ENCOUNTER — Other Ambulatory Visit (HOSPITAL_COMMUNITY): Payer: Self-pay

## 2021-06-30 MED ORDER — DIFLUPREDNATE 0.05 % OP EMUL
OPHTHALMIC | 1 refills | Status: DC
Start: 2021-06-30 — End: 2021-07-09
  Filled 2021-06-30: qty 5, 20d supply, fill #0
  Filled 2021-06-30 (×2): qty 5, 25d supply, fill #0

## 2021-07-02 ENCOUNTER — Encounter: Payer: Self-pay | Admitting: Surgery

## 2021-07-02 ENCOUNTER — Institutional Professional Consult (permissible substitution): Payer: 59 | Admitting: Surgery

## 2021-07-02 ENCOUNTER — Other Ambulatory Visit: Payer: Self-pay

## 2021-07-02 VITALS — BP 117/72 | HR 87 | Resp 20 | Ht 65.0 in | Wt 217.0 lb

## 2021-07-02 DIAGNOSIS — I350 Nonrheumatic aortic (valve) stenosis: Secondary | ICD-10-CM | POA: Diagnosis not present

## 2021-07-02 NOTE — Progress Notes (Signed)
Patient ID: Deanna Simmons, female   DOB: 1951-09-27, 70 y.o.   MRN: [id number]  [facility]        [address]       [place] [address]             [phone]          CARDIOTHORACIC SURGERY CONSULTATION REPORT  PCP is Josetta Huddle, [name], [name], MD  Reason for consultation: Severe aortic stenosis  HPI:   The patient is a 70 year old woman with a history of hypertension, hyperlipidemia, type 2 diabetes, coronary artery disease status post coronary artery bypass graft surgery x1 with a saphenous vein graft to the PDA in 2004 by me, prior stroke with right retinal artery occlusion and more recent TIA in June 2022, degenerative joint disease involving her knees and requiring knee replacement, SLE, and aortic stenosis that has been followed with periodic echocardiograms.  She had a 2D echo in December 2021 that showed a trileaflet aortic valve with mean gradient of 34 mmHg and mild to moderate aortic insufficiency with a pressure half-time of 397 ms.  The valve area at that time was 0.89 cm.  There was mild to moderate mitral regurgitation with a mean mitral valve gradient of 3 mmHg with mild calcification of the posterior leaflet and moderate to severe mitral annular calcification.  She now has developed exertional shortness of breath and fatigue that she said mainly occurs with walking up stairs.  She works in Best boy at [employer] and is up on her feet most of the day.  She does not have any shortness of breath with walking on level ground but she said that she is very tired at the end of the day.  She denies any chest pain or pressure.  She denies orthopnea and PND.  She has had no peripheral edema.  She had a follow-up echo on 05/05/2021 which showed a mean gradient of 38 mmHg and a peak gradient of 66 mmHg across aortic valve.  Aortic  valve regurgitation was felt to be mild to moderate with a pressure half-time of 295 ms.  Left ventricular ejection fraction is 55 to 60%.  The mean mitral valve gradient was measured at 4 mmHg with a mitral valve area of 2.04 cm consistent with mild mitral stenosis.  There is mild mitral regurgitation.  She is married and has 4 adult children and 8 grandchildren.  She reports seeing her dentist regularly.  She has partial dentures and no current problems.  Past Medical History:  Diagnosis Date   Abnormal finding on Pap smear, ASCUS    Anemia    Asthma    Chondromalacia of knee    Coronary artery disease    Diabetes mellitus without complication ([place])    Diverticulum 12/11/2002   in cecum   Fibroids    h/o   Glucose intolerance (impaired glucose tolerance)    H/O elevated lipids    H/O tinea cruris 02/2010   History of ASCVD    s/p CABG   Hypercholesteremia    Hypertension    Internal hemorrhoids    h/o   Irregular menses    h/o   MI (myocardial infarction) ([place]) age 41   Obesity    Pneumonia 07/2008   Synovitis of knee    Systemic lupus erythematosus ([place])    with nephritis  Vulvitis    h/o    Past Surgical History:  Procedure Laterality Date   CESAREAN SECTION     CORONARY ARTERY BYPASS GRAFT     Endometrial curettage  03/29/2000   RIGHT/LEFT HEART CATH AND CORONARY/GRAFT ANGIOGRAPHY N/A 06/11/2021   Procedure: RIGHT/LEFT HEART CATH AND CORONARY/GRAFT ANGIOGRAPHY;  Surgeon: Belva Crome, MD;  Location: [facility];  Service: Cardiovascular;  Laterality: N/A;   ROTATOR CUFF REPAIR     TUBAL LIGATION     UPPER GASTROINTESTINAL ENDOSCOPY  12/11/2002    Family History  Problem Relation Age of Onset   Diabetes Mother    Stroke Mother    Breast cancer Mother 55   Prostate cancer Father    Diabetes Father    Hyperlipidemia Father    Heart attack Paternal Uncle        2 paternal uncles   Ovarian cancer Cousin     Social History   Socioeconomic History    Marital status: Married    Spouse name: Alvester Chou   Number of children: 4   Years of education: 12+   Highest education level: Not on file  Occupational History   Occupation: C[employer]NA[employer]    Employer: [employer]  Tobacco Use   Smoking status: Former   Smokeless tobacco: Never  Substance and Sexual Activity   Alcohol use: Yes    Comment: occasional   Drug use: No   Sexual activity: Not on file  Other Topics Concern   Not on file  Social History Narrative   Lives with her husband. Their children are grown, 1 lives in [place], 1 in [place], [place],  1 in [place], [place], and one in [place], [place].   Right handed   Social Determinants of Health   Financial Resource Strain: Not on file  Food Insecurity: Not on file  Transportation Needs: Not on file  Physical Activity: Not on file  Stress: Not on file  Social Connections: Not on file  Intimate Partner Violence: Not on file    Prior to Admission medications   Medication Sig Start Date End Date Taking? Authorizing Provider  albuterol (PROVENTIL HFA) 108 (90 Base) MCG/ACT inhaler Inhale 2 puffs into the lungs every 4  hours as needed for wheezing or shortness of breath.  Office visit needed for further refills. 06/22/21  Yes Kozlow, Donnamarie Poag, MD  albuterol (VENTOLIN HFA) 108 (90 Base) MCG/ACT inhaler INHALE 2 PUFFS BY MOUTH EVERY 4-6 HOURS AS NEEDED FOR COUGH OR WHEEZE 06/13/19  Yes Kozlow, Donnamarie Poag, MD  ALPRAZolam Duanne Moron) 0.25 MG tablet Take 1 tablet (0.25 mg total) by mouth 2 (two) times daily as needed for anxiety 04/20/21  Yes   aspirin 81 MG tablet Take 81 mg by mouth in the morning and at bedtime.   Yes [provider]  beclomethasone (QVAR REDIHALER) 80 MCG/ACT inhaler Use 3 puffs 3 times daily during asthma flares. 06/23/20  Yes Kozlow, Donnamarie Poag, MD  Blood Glucose Monitoring Suppl (FREESTYLE LITE) w/Device KIT USE TO TEST TWO TIMES DAILY PRIOR TO MEALS 01/20/21 01/20/22 Yes Josetta Huddle, MD  cetirizine (ZYRTEC) 10 MG  tablet Take 10 mg by mouth daily.   Yes [provider]  Cholecalciferol (VITAMIN D3) 2000 UNITS TABS Take 1 tablet by mouth daily. With Vitamin K2 in it   Yes [provider]  ciclopirox (PENLAC) 8 % solution APPLY TO THE AFFECTED AREA(S) AT BEDTIME AND EVERY 7 DAYS CLEAN NAILS WITH ALCOHOL 11/04/20 11/04/21 Yes Inda Merlin,  Herbie Baltimore, MD  dapagliflozin propanediol (FARXIGA) 10 MG TABS tablet Take 1 tablet by mouth once a day 02/18/21  Yes   Dextromethorphan-Guaifenesin (MUCINEX DM PO) Take 1 tablet by mouth as needed (colds).   Yes [provider]  Difluprednate (DUREZOL) 0.05 % EMUL Start 2 days prior to surgery. use 1 drop in right eye 4 times daily. use as directed after surgery 06/04/21  Yes Katy Apo, MD  Difluprednate (DUREZOL) 0.05 % EMUL Instill one drop into the right eye 4 times daily. 06/30/21  Yes Katy Apo, MD  Estradiol 10 MCG TABS vaginal tablet INSERT 1 TABLET TWICE A WEEK VAGINALLY AS DIRECTED 09/30/20 09/30/21 Yes Waymon Amato, MD  ezetimibe (ZETIA) 10 MG tablet Take 1 tablet (10 mg total) by mouth daily. Pt must keep upcoming appt in July for further refills 03/15/21  Yes Belva Crome, MD  famotidine (PEPCID) 40 MG tablet TAKE ONE TABLET BY MOUTH ONCE DAILY. INCREASE TO ONE TABLET TWICE DAILY DURING FLARE-UP. 10/21/20 10/21/21 Yes Kozlow, Donnamarie Poag, MD  ferrous sulfate 325 (65 FE) MG tablet Take 325 mg by mouth every other day.   Yes [provider]  fluticasone (FLONASE) 50 MCG/ACT nasal spray INSTILL 1 TO 2 SPRAYS INTO EACH NOSTRIL DAILY FOR STUFFY NOSE OR DRAINAGE Patient taking differently: Place 2 sprays into both nostrils daily as needed for allergies. 11/12/18  Yes Kozlow, Donnamarie Poag, MD  glucose blood test strip USE TO TEST TWICE A DAY BEFORE MEALS 01/20/21 01/20/22 Yes Josetta Huddle, MD  glucose blood test strip USE WHEN CHECKING BLOOD SUGAR TWICE DAILY PRIOR TO MEALS 12/16/20 12/16/21 Yes Josetta Huddle, MD  Lancets (FREESTYLE) lancets USE TO TEST TWO TIMES  DAILY PRIOR TO MEALS 01/20/21 01/20/22 Yes Josetta Huddle, MD  magnesium gluconate (MAGONATE) 500 MG tablet Take 500 mg by mouth daily as needed (cramps).   Yes [provider]  metFORMIN (GLUCOPHAGE-XR) 500 MG 24 hr tablet TAKE 1 TABLET BY MOUTH TWICE DAILY WITH A MEAL 05/13/21  Yes   metoprolol succinate (TOPROL-XL) 25 MG 24 hr tablet TAKE 1 TABLET BY MOUTH TWICE DAILY 09/28/20 09/28/21 Yes Josetta Huddle, MD  Multiple Vitamins-Minerals (MULTIVITAL PO) Take 1 tablet by mouth daily. Anti ox   Yes [provider]  predniSONE (DELTASONE) 50 MG tablet Take 1 tablet (50 mg) 13 hours, 7 hours, and 1 hour prior to your CT scans. 06/18/21  Yes Sherren Mocha, MD  Probiotic Product (PROBIOTIC DAILY PO) Take 1 capsule by mouth daily. Ultra Flora IB   Yes [provider]  rosuvastatin (CRESTOR) 40 MG tablet Take 1 tablet (40 mg total) by mouth daily. Please keep upcoming appt in December with Dr. Tamala Julian for future refills. Thank you 09/24/19  Yes Belva Crome, MD  TRUE [facility] BLOOD GLUCOSE TEST test strip  12/25/15  Yes [provider]  TRUEPLUS LANCETS 30G MISC  12/25/15  Yes [provider]  valACYclovir (VALTREX) 500 MG tablet Take 500 mg by mouth daily as needed (cold sores/blisters). Reported on 12/01/2015   Yes [provider]  vitamin C (ASCORBIC ACID) 500 MG tablet Take 500 mg by mouth daily.   Yes [provider]  zinc gluconate 50 MG tablet Take 50 mg by mouth daily.   Yes [provider]  ADVAIR DISKUS 250-50 MCG/DOSE AEPB INHALE 1 PUFF INTO THE LUNGS IN THE MORNING AND AT BEDTIME. 06/09/20 06/11/21  Kozlow, Donnamarie Poag, MD  moxifloxacin (VIGAMOX) 0.5 % ophthalmic solution instil 1 drop in right eye  4 times a day starting 2 days prior to surgery & 2 drops morning of surgery Patient not taking: Reported on 07/02/2021 06/04/21   Katy Apo, MD    Current Outpatient Medications  Medication Sig Dispense Refill   albuterol (PROVENTIL HFA) 108  (90 Base) MCG/ACT inhaler Inhale 2 puffs into the lungs every 4  hours as needed for wheezing or shortness of breath.  Office visit needed for further refills. 18 g 0   albuterol (VENTOLIN HFA) 108 (90 Base) MCG/ACT inhaler INHALE 2 PUFFS BY MOUTH EVERY 4-6 HOURS AS NEEDED FOR COUGH OR WHEEZE 18 g 1   ALPRAZolam (XANAX) 0.25 MG tablet Take 1 tablet (0.25 mg total) by mouth 2 (two) times daily as needed for anxiety 30 tablet 1   aspirin 81 MG tablet Take 81 mg by mouth in the morning and at bedtime.     beclomethasone (QVAR REDIHALER) 80 MCG/ACT inhaler Use 3 puffs 3 times daily during asthma flares. 10.6 g 5   Blood Glucose Monitoring Suppl (FREESTYLE LITE) w/Device KIT USE TO TEST TWO TIMES DAILY PRIOR TO MEALS 1 kit 3   cetirizine (ZYRTEC) 10 MG tablet Take 10 mg by mouth daily.     Cholecalciferol (VITAMIN D3) 2000 UNITS TABS Take 1 tablet by mouth daily. With Vitamin K2 in it     ciclopirox (PENLAC) 8 % solution APPLY TO THE AFFECTED AREA(S) AT BEDTIME AND EVERY 7 DAYS CLEAN NAILS WITH ALCOHOL 13.2 mL 1   dapagliflozin propanediol (FARXIGA) 10 MG TABS tablet Take 1 tablet by mouth once a day 90 tablet 3   Dextromethorphan-Guaifenesin (MUCINEX DM PO) Take 1 tablet by mouth as needed (colds).     Difluprednate (DUREZOL) 0.05 % EMUL Start 2 days prior to surgery. use 1 drop in right eye 4 times daily. use as directed after surgery 5 mL 1   Difluprednate (DUREZOL) 0.05 % EMUL Instill one drop into the right eye 4 times daily. 5 mL 1   Estradiol 10 MCG TABS vaginal tablet INSERT 1 TABLET TWICE A WEEK VAGINALLY AS DIRECTED 8 tablet 12   ezetimibe (ZETIA) 10 MG tablet Take 1 tablet (10 mg total) by mouth daily. Pt must keep upcoming appt in July for further refills 90 tablet 0   famotidine (PEPCID) 40 MG tablet TAKE ONE TABLET BY MOUTH ONCE DAILY. INCREASE TO ONE TABLET TWICE DAILY DURING FLARE-UP. 60 tablet 5   ferrous sulfate 325 (65 FE) MG tablet Take 325 mg by mouth every other day.     fluticasone  (FLONASE) 50 MCG/ACT nasal spray INSTILL 1 TO 2 SPRAYS INTO EACH NOSTRIL DAILY FOR STUFFY NOSE OR DRAINAGE (Patient taking differently: Place 2 sprays into both nostrils daily as needed for allergies.) 48 g 5   glucose blood test strip USE TO TEST TWICE A DAY BEFORE MEALS 200 strip 3   glucose blood test strip USE WHEN CHECKING BLOOD SUGAR TWICE DAILY PRIOR TO MEALS 200 strip 3   Lancets (FREESTYLE) lancets USE TO TEST TWO TIMES DAILY PRIOR TO MEALS 200 each 3   magnesium gluconate (MAGONATE) 500 MG tablet Take 500 mg by mouth daily as needed (cramps).     metFORMIN (GLUCOPHAGE-XR) 500 MG 24 hr tablet TAKE 1 TABLET BY MOUTH TWICE DAILY WITH A MEAL 180 tablet 3   metoprolol succinate (TOPROL-XL) 25 MG 24 hr tablet TAKE 1 TABLET BY MOUTH TWICE DAILY 180 tablet 3   Multiple Vitamins-Minerals (MULTIVITAL PO) Take 1 tablet by mouth daily. Anti ox  predniSONE (DELTASONE) 50 MG tablet Take 1 tablet (50 mg) 13 hours, 7 hours, and 1 hour prior to your CT scans. 3 tablet 0   Probiotic Product (PROBIOTIC DAILY PO) Take 1 capsule by mouth daily. Ultra Flora IB     rosuvastatin (CRESTOR) 40 MG tablet Take 1 tablet (40 mg total) by mouth daily. Please keep upcoming appt in December with Dr. Tamala Julian for future refills. Thank you 90 tablet 0   TRUE METRIX BLOOD GLUCOSE TEST test strip   3   TRUEPLUS LANCETS 30G MISC   3   valACYclovir (VALTREX) 500 MG tablet Take 500 mg by mouth daily as needed (cold sores/blisters). Reported on 12/01/2015     vitamin C (ASCORBIC ACID) 500 MG tablet Take 500 mg by mouth daily.     zinc gluconate 50 MG tablet Take 50 mg by mouth daily.     ADVAIR DISKUS 250-50 MCG/DOSE AEPB INHALE 1 PUFF INTO THE LUNGS IN THE MORNING AND AT BEDTIME. 60 each 5   moxifloxacin (VIGAMOX) 0.5 % ophthalmic solution instil 1 drop in right eye 4 times a day starting 2 days prior to surgery & 2 drops morning of surgery (Patient not taking: Reported on 07/02/2021) 3 mL 1   No current facility-administered  medications for this visit.    Allergies  Allergen Reactions   Biaxin [Clarithromycin] Itching   Cardiolite [Technetium-74m Itching   Ciprofloxacin Itching   Erythromycin Base Itching   Montelukast Sodium Itching   Niaspan [Niacin Er] Other (See Comments)    flushing   Penicillins Itching    Reaction: over 10 years   Prilosec [Omeprazole] Itching   Singulair [Montelukast] Hives and Itching   Ticlid [Ticlopidine Hcl] Itching   Iodinated Diagnostic Agents Itching    Pt has a potential contrast allergy; She states in 2012 her cardiologist told her she had a reaction during the "study". We are unable to determine the study or confirm a reaction. The patient will have a 13 hr prep for future studies. Molecular Imaging states their injection does not cause any reactions.      Review of Systems:   General:  + decreased appetite, + decreased energy, no weight gain, + weight loss, no fever  Cardiac:  no chest pain with exertion, no chest pain at rest, +SOB with moderate exertion, no resting SOB, no PND, no orthopnea, no palpitations, no arrhythmia, no atrial fibrillation, no LE edema, no dizzy spells, no syncope  Respiratory:  + exertional shortness of breath, no home oxygen, no productive cough, no dry cough, no bronchitis, no wheezing, no hemoptysis, + asthma, no pain with inspiration or cough, no sleep apnea, no CPAP at night  GI:   no difficulty swallowing, + reflux, no frequent heartburn, no hiatal hernia, no abdominal pain, no constipation, no diarrhea, no hematochezia, no hematemesis, no melena  GU:   no dysuria,  no frequency, no urinary tract infection, no hematuria, no kidney stones, no kidney disease  Vascular:  no pain suggestive of claudication, no pain in feet, + leg cramps, no varicose veins, no DVT, no non-healing foot ulcer  Neuro:   + stroke, + TIA's, no seizures, no headaches, no temporary blindness one eye,  no slurred speech, no peripheral neuropathy, no chronic pain, +  instability of gait, no memory/cognitive dysfunction  Musculoskeletal: + arthritis - primarily involving the knees, no joint swelling, no myalgias, + difficulty walking, + decreased mobility   Skin:   no rash, no itching, no skin infections, no  pressure sores or ulcerations  Psych:   no anxiety, no depression, no nervousness, no unusual recent stress  Eyes:   no blurry vision, no floaters, no recent vision changes, + wears glasses   ENT:   no hearing loss, no loose or painful teeth, + dentures, last saw dentist 01/2021  Hematologic:  no easy bruising, no abnormal bleeding, no clotting disorder, no frequent epistaxis  Endocrine:  + diabetes, does check CBG's at home     Physical Exam:   BP 117/72 (BP Location: Right Arm, Patient Position: Sitting)   Pulse 87   Resp 20   Ht _0  (1.651 m)   Wt 217 lb (98.4 kg)   SpO2 98% Comment: RA  BMI 36.11 kg/m   General:  well-appearing  HEENT:  Unremarkable, NCAT, PERLA, EOMI  Neck:   no JVD, no bruits, no adenopathy   Chest:   clear to auscultation, symmetrical breath sounds, no wheezes, no rhonchi   CV:   RRR, 3/6 systolic murmur RSB, No diastolic murmur  Abdomen:  soft, non-tender, no masses   Extremities:  warm, well-perfused, pulses palpable at ankle, no lower extremity edema  Rectal/GU  Deferred  Neuro:   Grossly non-focal and symmetrical throughout  Skin:   Clean and dry, no rashes, no breakdown  Diagnostic Tests:   ECHOCARDIOGRAM REPORT         Patient Name:   Deanna Simmons Date of Exam: 05/05/2021  Medical Rec #:  [id number]       Height:       64.0 in  Accession #:    [id number]      Weight:       221.4 lb  Date of Birth:  12/18/1950       BSA:          2.043 m  Patient Age:    7 years        BP:           110/72 mmHg  Patient Gender: F               HR:           66 bpm.  Exam Location:  [address]   Procedure: 2D Echo, 3D Echo, Cardiac Doppler, Color Doppler and Strain  Analysis   Indications:    G45.9 TIA      History:        Patient has prior history of Echocardiogram examinations,  most                  recent 10/22/2020. CAD and Previous Myocardial Infarction,                  Aortic Valve Disease; Risk Factors:Hypertension, Diabetes,                  Dyslipidemia, Former Smoker and Obesity. Anemia.     Sonographer:    Jessee Avers, RDCS  Referring Phys: [id number] ADAM R JAFFE   IMPRESSIONS     1. Severe aortic stenosis is present. V max 4.06 m/s, MG 38 mmHG. AVA  over estimated (1.1 cm2) as LVOT PW signal obtained too close to the valve  (LVOT VTI 39 cm). Based on Vmax and mean gradient, aortic stenosis is  severe. There is mild to moderate  aortic regurgitation, but this is not well visualized. The aortic valve is  tricuspid. There is severe calcifcation of the aortic valve. There is  severe thickening of the  aortic valve. Aortic valve regurgitation is mild  to moderate. Severe aortic valve  stenosis.   2. Left ventricular ejection fraction, by estimation, is 55 to 60%. Left  ventricular ejection fraction by 3D volume is 57 %. The left ventricle has  normal function. The left ventricle has no regional wall motion  abnormalities. Left ventricular diastolic   function could not be evaluated. The average left ventricular global  longitudinal strain is -22.2 %. The global longitudinal strain is normal.   3. Right ventricular systolic function is normal. The right ventricular  size is normal. There is normal pulmonary artery systolic pressure. The  estimated right ventricular systolic pressure is 99.3 mmHg.   4. Left atrial size was mild to moderately dilated.   5. Mild calcific mitral stenosis. MVA by VTI 2.04 cm2. MG 4 mmHG @ 64  bpm. The mitral valve is degenerative. Mild mitral valve regurgitation.  Mild mitral stenosis. The mean mitral valve gradient is 4.0 mmHg. Moderate  to severe mitral annular  calcification.   6. The inferior vena cava is normal in size with greater than 50%   respiratory variability, suggesting right atrial pressure of 3 mmHg.   Comparison(s): Changes from prior study are noted. Aortic stenosis is now  severe.   FINDINGS   Left Ventricle: Left ventricular ejection fraction, by estimation, is 55  to 60%. Left ventricular ejection fraction by 3D volume is 57 %. The left  ventricle has normal function. The left ventricle has no regional wall  motion abnormalities. The average  left ventricular global longitudinal strain is -22.2 %. The global  longitudinal strain is normal. The left ventricular internal cavity size  was normal in size. There is no left ventricular hypertrophy. Left  ventricular diastolic function could not be  evaluated due to mitral annular calcification (moderate or greater). Left  ventricular diastolic function could not be evaluated.   Right Ventricle: The right ventricular size is normal. No increase in  right ventricular wall thickness. Right ventricular systolic function is  normal. There is normal pulmonary artery systolic pressure. The tricuspid  regurgitant velocity is 2.73 m/s, and   with an assumed right atrial pressure of 3 mmHg, the estimated right  ventricular systolic pressure is 57.0 mmHg.   Left Atrium: Left atrial size was mild to moderately dilated.   Right Atrium: Right atrial size was normal in size.   Pericardium: Trivial pericardial effusion is present.   Mitral Valve: Mild calcific mitral stenosis. MVA by VTI 2.04 cm2. MG 4  mmHG @ 64 bpm. The mitral valve is degenerative in appearance. Moderate to  severe mitral annular calcification. Mild mitral valve regurgitation. Mild  mitral valve stenosis. MV peak  gradient, 11.7 mmHg. The mean mitral valve gradient is 4.0 mmHg with  average heart rate of 64 bpm.   Tricuspid Valve: The tricuspid valve is grossly normal. Tricuspid valve  regurgitation is mild . No evidence of tricuspid stenosis.   Aortic Valve: Severe aortic stenosis is present. V max  4.06 m/s, MG 38  mmHG. AVA over estimated (1.1 cm2) as LVOT PW signal obtained too close to  the valve (LVOT VTI 39 cm). Based on Vmax and mean gradient, aortic  stenosis is severe. There is mild to  moderate aortic regurgitation, but this is not well visualized. The aortic  valve is tricuspid. There is severe calcifcation of the aortic valve.  There is severe thickening of the aortic valve. Aortic valve regurgitation  is mild to moderate. Aortic  regurgitation  PHT measures 295 msec. Severe aortic stenosis is present.  Aortic valve mean gradient measures 38.0 mmHg. Aortic valve peak gradient  measures 65.9 mmHg. Aortic valve area, by VTI measures 1.11 cm.   Pulmonic Valve: The pulmonic valve was grossly normal. Pulmonic valve  regurgitation is trivial. No evidence of pulmonic stenosis.   Aorta: The aortic root and ascending aorta are structurally normal, with  no evidence of dilitation.   Venous: The inferior vena cava is normal in size with greater than 50%  respiratory variability, suggesting right atrial pressure of 3 mmHg.   IAS/Shunts: There is right bowing of the interatrial septum, suggestive of  elevated left atrial pressure. The atrial septum is grossly normal.      LEFT VENTRICLE  PLAX 2D  LVIDd:         5.20 cm         Diastology  LVIDs:         3.20 cm         LV e' medial:    4.47 cm/s  LV PW:         1.00 cm         LV E/e' medial:  31.1  LV IVS:        1.00 cm         LV e' lateral:   4.12 cm/s  LVOT diam:     2.00 cm         LV E/e' lateral: 33.7  LV SV:         123  LV SV Index:   60              2D  LVOT Area:     3.14 cm        Longitudinal                                 Strain                                 2D Strain GLS  -19.3 %                                 (A2C):                                 2D Strain GLS  -22.5 %                                 (A3C):                                 2D Strain GLS  -24.9 %                                 (A4C):                                  2D Strain GLS  -22.2 %  Avg:                                    3D Volume EF                                 LV 3D EF:    Left                                              ventricular                                              ejection                                              fraction by                                              3D volume                                              is 57 %.                                    3D Volume EF:                                 3D EF:        57 %                                 LV EDV:       166 ml                                 LV ESV:       71 ml                                 LV SV:        95 ml   RIGHT VENTRICLE  RV Basal diam:  3.40 cm  RV S prime:     8.25 cm/s  TAPSE (M-mode): 2.0 cm  RVSP:           32.8 mmHg   LEFT ATRIUM              Index       RIGHT ATRIUM           Index  LA diam:  4.80 cm  2.35 cm/m  RA Pressure: 3.00 mmHg  LA Vol (A2C):   62.6 ml  30.65 ml/m RA Area:     12.60 cm  LA Vol (A4C):   102.0 ml 49.93 ml/m RA Volume:   28.30 ml  13.85 ml/m  LA Biplane Vol: 83.8 ml  41.02 ml/m   AORTIC VALVE  AV Area (Vmax):    1.08 cm  AV Area (Vmean):   1.15 cm  AV Area (VTI):     1.11 cm  AV Vmax:           406.00 cm/s  AV Vmean:          280.667 cm/s  AV VTI:            1.100 m  AV Peak Grad:      65.9 mmHg  AV Mean Grad:      38.0 mmHg  LVOT Vmax:         139.00 cm/s  LVOT Vmean:        103.000 cm/s  LVOT VTI:          0.390 m  LVOT/AV VTI ratio: 0.35  AI PHT:            295 msec     AORTA  Ao Root diam: 3.20 cm  Ao Asc diam:  3.50 cm   MITRAL VALVE                TRICUSPID VALVE                              TR Peak grad:   29.8 mmHg                              TR Vmax:        273.00 cm/s  MV Peak grad:  11.7 mmHg    Estimated RAP:  3.00 mmHg  MV Mean grad:  4.0 mmHg     RVSP:           32.8 mmHg  MV Vmax:       1.71 m/s   MV Vmean:      97.9 cm/s    SHUNTS  MV VTI:        0.57 m       Systemic VTI:  0.39 m  MV Decel Time: 370 msec     Systemic Diam: 2.00 cm  MV E velocity: 139.00 cm/s  MV A velocity: 163.00 cm/s  MV E/A ratio:  0.85   Eleonore Chiquito MD  Electronically signed by Eleonore Chiquito MD  Signature Date/Time: 05/05/2021/11:16:25 AM      Physicians  Panel Physicians Referring Physician Case Authorizing Physician  Belva Crome, MD (Primary)     Procedures  RIGHT/LEFT HEART CATH AND CORONARY/GRAFT ANGIOGRAPHY   Conclusion      Ost RCA to Mid RCA lesion is 100% stenosed.   Prox Cx lesion is 30% stenosed.   Mid LM to Dist LM lesion is 20% stenosed.   Ost LAD lesion is 20% stenosed.   Origin lesion is 60% stenosed.   LV end diastolic pressure is moderately elevated.   LV end diastolic pressure is normal.   Hemodynamic findings consistent with moderate pulmonary hypertension.   There is severe aortic valve stenosis.   There is moderate mitral valve stenosis and mild (2+) mitral regurgitation.   Severe  aortic stenosis with mean gradient 36 mmHg and calculated aortic valve area 0.91 cm. 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  V wave 47 mmHg.  A simultaneous gradient across the mitral valve was not recorded (however, mean wedge pressure of 28 mmHg minus LVEDP 17 mmHg = 11 mmHg, likely overestimating gradient due to V wave). Total occlusion of RCA. Patent left main Patent LAD Patent circumflex Patent 71 year old SVG to distal RCA with ostial proximal 50 to 70% narrowing.   RECOMMENDATIONS:   Referred to the structural heart team for consideration of TAVR and opinion concerning mitral valve.   Recommendations  Antiplatelet/Anticoag Recommend Aspirin 69m daily for moderate CAD.  Discharge Date In the absence of any other complications or medical issues, we expect the patient to be ready for discharge  on 06/11/2021.   Surgeon Notes    06/11/2021  9:18 AM CV Procedure signed by SBelva Crome MD   Indications  Nonrheumatic mitral valve stenosis with insufficiency [I34.2, I34.0 (ICD-10-CM)]  Severe aortic stenosis [I35.0 (ICD-10-CM)]   Procedural Details  Technical Details The right radial area was sterilely prepped and draped. Intravenous sedation with Versed and fentanyl was administered. 1% Xylocaine was infiltrated to achieve local analgesia. Using real-time vascular ultrasound, a double wall stick with an angiocath was utilized to obtain intra-arterial access. A VUS image was saved for the permanent record.The modified Seldinger technique was used to place a 33F " Slender" sheath in the right radial artery. Weight based heparin was administered. Coronary angiography was done using 5 F catheters. Right coronary angiography was performed with a JR4. Left ventricular hemodymic recordings and angiography was done using the JR 4 catheter and hand injection. Left coronary angiography was performed with a JL 3.5 cm.  Right heart catheterization was performed by exchanging a previously placed antecubital IV angio-cath for a 5 French Slender sheath. 1% Xylocaine was used to locally nesthetize the area around the IV site. The IV catheter was wired using an .018 guidewire. The modified Seldinger technique was used to place the 5 FPakistansheath. Double glove technique was used to enhance sterility. After sheath insertion, right heart cath was performed using a 5 French balloon tipped catheter and fluoroscopic guidance. Pressures were recorded in each chamber and in the pulmonary capillary wedge position.. The main pulmonary artery O2 saturation was sampled.   Hemostasis was achieved using a pneumatic band.  During this procedure the patient is administered a total of Versed 1.5 mg and Fentanyl 75 mcg to achieve and maintain moderate conscious sedation.  The patient's heart rate, blood pressure, and oxygen  saturation are monitored continuously during the procedure. The period of conscious sedation is 35 minutes, of which I was present face-to-face 100% of this time.  Estimated blood loss <50 mL.   During this procedure medications were administered to achieve and maintain moderate conscious sedation while the patient's heart rate, blood pressure, and oxygen saturation were continuously monitored and I was present face-to-face 100% of this time.   Medications (Filter: Administrations occurring from 0741 to 0850 on 06/11/21) Heparin (Porcine) in NaCl 1000-0.9 UT/500ML-% SOLN (mL) Total volume:  1,000 mL Date/Time Rate/Dose/Volume Action   06/11/21 0744 500 mL Given   0744 500 mL Given    fentaNYL (SUBLIMAZE) injection (mcg) Total dose:  75 mcg Date/Time Rate/Dose/Volume Action   06/11/21 0745 50 mcg Given   0757 25 mcg Given    midazolam (VERSED) injection (mg) Total dose:  1.5 mg Date/Time Rate/Dose/Volume Action   06/11/21 0745 1 mg Given   0757 0.5 mg Given    lidocaine (PF) (XYLOCAINE) 1 % injection (mL) Total volume:  4 mL Date/Time Rate/Dose/Volume Action   06/11/21 0749 4 mL Given    Radial Cocktail/Verapamil only (mL) Total volume:  10 mL Date/Time Rate/Dose/Volume Action   06/11/21 0757 10 mL Given    heparin sodium (porcine) injection (Units) Total dose:  5,000 Units Date/Time Rate/Dose/Volume Action   06/11/21 0805 5,000 Units Given    iohexol (OMNIPAQUE) 350 MG/ML injection (mL) Total volume:  115 mL Date/Time Rate/Dose/Volume Action   06/11/21 0836 115 mL Given    Sedation Time  Sedation Time Physician-1: 49 minutes 55 seconds Contrast  Medication Name Total Dose  iohexol (OMNIPAQUE) 350 MG/ML injection 115 mL   Radiation/Fluoro  Fluoro time: 16.1 (min) DAP: 37801 (mGycm2) Cumulative Air Kerma: 568 (mGy) Coronary Findings  Diagnostic Dominance: Right Left Main  Mid LM to Dist LM lesion is 20% stenosed.  Left Anterior Descending  The vessel  exhibits minimal luminal irregularities.  Ost LAD lesion is 20% stenosed.  Left Circumflex  Prox Cx lesion is 30% stenosed.  Right Coronary Artery  The vessel exhibits minimal luminal irregularities.  Ost RCA to Mid RCA lesion is 100% stenosed. The lesion was previously treated .  Right Posterior Descending Artery  The vessel exhibits minimal luminal irregularities.  Graft To RPDA  Origin lesion is 60% stenosed.  Intervention  No interventions have been documented. Right Heart  Right Heart Pressures Hemodynamic findings consistent with moderate pulmonary hypertension. LV EDP is normal.  Right Atrium Right atrial pressure is normal.   Left Heart  Left Ventricle LV end diastolic pressure is moderately elevated.  Mitral Valve There is moderate mitral valve stenosis and mild (2+) mitral regurgitation. Heavy mitral annulus calcification  Aortic Valve There is severe aortic valve stenosis. The aortic valve is calcified. There is restricted aortic valve motion.   Coronary Diagrams  Diagnostic Dominance: Right Intervention  Implants     No implant documentation for this case.   Syngo Images   Show images for CARDIAC CATHETERIZATION Images on Long Term Storage   Show images for [place], [place] to Procedure Log  Procedure Log    Hemo Data  Flowsheet Row Most Recent Value  Fick Cardiac Output 5.88 L/min  Fick Cardiac Output Index 2.88 (L/min)/BSA  Aortic Mean Gradient 36.07 mmHg  Aortic Peak Gradient 36 mmHg  Aortic Valve Area 0.91  Aortic Value Area Index 0.45 cm2/BSA  RA A Wave 9 mmHg  RA V Wave 8 mmHg  RA Mean 5 mmHg  RV Systolic Pressure 44 mmHg  RV Diastolic Pressure 1 mmHg  RV EDP 8 mmHg  PA Systolic Pressure 45 mmHg  PA Diastolic Pressure 21 mmHg  PA Mean 35 mmHg  PW A Wave 29 mmHg  PW V Wave 47 mmHg  PW Mean 28 mmHg  AO Systolic Pressure 259 mmHg  AO Diastolic Pressure 63 mmHg  AO Mean 94 mmHg  LV Systolic Pressure 563 mmHg  LV Diastolic  Pressure 1 mmHg  LV EDP 17 mmHg  AOp Systolic Pressure 875 mmHg  AOp Diastolic Pressure 64 mmHg  AOp Mean Pressure 99 mmHg  LVp Systolic Pressure 643 mmHg  LVp Diastolic Pressure 2 mmHg  LVp EDP Pressure 17 mmHg  QP/QS 1  TPVR Index 12.12 HRUI  TSVR Index 32.56 HRUI  PVR SVR Ratio 0.08  TPVR/TSVR Ratio 0.37  ADDENDUM REPORT: 06/30/2021 19:29   CLINICAL DATA:  Severe Aortic Stenosis.   EXAM: Cardiac TAVR CT   TECHNIQUE: A non-contrast, gated CT scan was obtained with axial slices of 3 mm through the heart for aortic valve calcium scoring. A 120 kV retrospective, gated, contrast cardiac scan was obtained. Gantry rotation speed was 250 msecs and collimation was 0.6 mm. Nitroglycerin was not given. The 3D data set was reconstructed in 5% intervals of the 0-95% of the R-R cycle. Systolic and diastolic phases were analyzed on a dedicated workstation using MPR, MIP, and VRT modes. The patient received 100 cc of contrast.   FINDINGS: Image quality: Average.  PVC noted during the exam.   Noise artifact is: Limited.   Valve Morphology: The aortic valve is tricuspid with severe calcifications and restricted movement in systole. The calcifications appear diffuse.   Aortic Valve Calcium score: 1696   Aortic annular dimension:   Phase assessed: 15%   Annular area: 409 mm2   Annular perimeter: 72.6 mm   Max diameter: 24.7 mm   Min diameter: 21.3 mm   Annular and subannular calcification: None.   Optimal coplanar projection: LAO 3 CRA 22   Coronary Artery Height above Annulus:   Left Main: 17.7 mm   Right Coronary: 16.1 mm   Sinus of Valsalva Measurements:   Non-coronary: 27.5 mm   Right-coronary: 24.9 mm   Left-coronary: 28.1 mm   Average diameter: 27.0 mm   Sinus of Valsalva Height:   Non-coronary: 19.3 mm   Right-coronary: 24.0 mm   Left-coronary: 24.5 mm   Sinotubular Junction: 28 mm with a single severe protruding calcification noted.    Ascending Thoracic Aorta: 35 mm.   Coronary Arteries: Normal coronary origin. Right dominance. The study was performed without use of NTG and is insufficient for plaque evaluation. Please refer to recent cardiac catheterization for coronary assessment. 3-vessel coronary calcifications noted. SVG-RCA noted.   Cardiac Morphology:   Right Atrium: Right atrial size is within normal limits.   Right Ventricle: The right ventricular cavity is within normal limits.   Left Atrium: Left atrial size is normal in size with no left atrial appendage filling defect.   Left Ventricle: The ventricular cavity size is within normal limits. There are no stigmata of prior infarction. There is no abnormal filling defect.   Pulmonary arteries: Normal in size without proximal filling defect.   Pulmonary veins: Normal pulmonary venous drainage.   Pericardium: Normal thickness with no significant effusion or calcium present.   Mitral Valve: The mitral valve is degenerative with moderate to severe annular calcium.   Extra-cardiac findings: See attached radiology report for non-cardiac structures.   IMPRESSION: 1. Tricuspid aortic valve with diffuse severe calcifications. Severe aortic stenosis with calcium score 1696.   2. Difficult study with PVC noted during systole but annular measurements support a 23 mm Edwards Sapien 3 (409 mm2).   3. Severe protruding calcification noted at the STJ which should be considered for self expanding valve.   4. No significant annular or subannular calcifications.   5. Sufficient coronary to annulus distance.   6. Optimal Fluoroscopic Angle for Delivery: LAO 3 CRA 79   Banks T. Audie Box, MD     Electronically Signed   By: Eleonore Chiquito M.D.   On: 06/30/2021 19:29    Addended by Geralynn Rile, MD on 06/30/2021  7:31 PM   Study Result  Narrative & Impression  EXAM: OVER-READ INTERPRETATION  CT CHEST   The following report  is an over-read  performed by radiologist Dr. Vinnie Langton of [facility], [place] on 06/29/2021. This over-read does not include interpretation of cardiac or coronary anatomy or pathology. The coronary calcium score/coronary CTA interpretation by the cardiologist is attached.   COMPARISON:  None.   FINDINGS: Extracardiac findings will be described separately under dictation for contemporaneously obtained CTA chest, abdomen and pelvis.   IMPRESSION: Please see separate dictation for contemporaneously obtained CTA chest, abdomen and pelvis dated 06/29/2021 for full description of relevant extracardiac findings.   Electronically Signed: By: Vinnie Langton M.D. On: 06/29/2021 11:47     Narrative & Impression  CLINICAL DATA:  70 year old female with history of severe aortic stenosis. Preprocedural study prior to potential transcatheter aortic valve replacement (TAVR) procedure.   EXAM: CT ANGIOGRAPHY CHEST, ABDOMEN AND PELVIS   TECHNIQUE: Multidetector CT imaging through the chest, abdomen and pelvis was performed using the standard protocol during bolus administration of intravenous contrast. Multiplanar reconstructed images and MIPs were obtained and reviewed to evaluate the vascular anatomy.   CONTRAST:  172m OMNIPAQUE IOHEXOL 350 MG/ML SOLN   COMPARISON:  No priors.   FINDINGS: CTA CHEST FINDINGS   Cardiovascular: Heart size is enlarged. There is no significant pericardial fluid, thickening or pericardial calcification. There is aortic atherosclerosis, as well as atherosclerosis of the great vessels of the mediastinum and the coronary arteries, including calcified atherosclerotic plaque in the left main, left anterior descending, left circumflex and right coronary arteries. Severe thickening calcification of the aortic valve. Severe calcifications of the mitral annulus. Status post median sternotomy for CABG.   Mediastinum/Lymph Nodes: No pathologically enlarged  mediastinal or hilar lymph nodes. Esophagus is unremarkable in appearance. No axillary lymphadenopathy.   Lungs/Pleura: No suspicious appearing pulmonary nodules or masses are noted. No acute consolidative airspace disease. No pleural effusions.   Musculoskeletal/Soft Tissues: Median sternotomy wires. There are no aggressive appearing lytic or blastic lesions noted in the visualized portions of the skeleton.   CTA ABDOMEN AND PELVIS FINDINGS   Hepatobiliary: No suspicious cystic or solid hepatic lesions. No intra or extrahepatic biliary ductal dilatation. Gallbladder is normal in appearance.   Pancreas: No pancreatic mass. No pancreatic ductal dilatation. No pancreatic or peripancreatic fluid collections or inflammatory changes.   Spleen: Unremarkable.   Adrenals/Urinary Tract: 1.2 cm low-attenuation lesion in the medial aspect of the lower pole of the right kidney. Left kidney and bilateral adrenal glands are normal in appearance. No hydroureteronephrosis. Urinary bladder is nearly decompressed, but otherwise unremarkable in appearance.   Stomach/Bowel: Normal appearance of the stomach. No pathologic dilatation of small bowel or colon. A few scattered colonic diverticulae are noted, without surrounding inflammatory changes to suggest an acute diverticulitis at this time. Normal appendix.   Vascular/Lymphatic: Aortic atherosclerosis, without evidence of aneurysm or dissection in the abdominal or pelvic vasculature. Vascular findings and measurements pertinent to potential TAVR procedure, as detailed below. No lymphadenopathy noted in the abdomen or pelvis.   Reproductive: Uterus and ovaries are atrophic.   Other: No significant volume of ascites.  No pneumoperitoneum.   Musculoskeletal: There are no aggressive appearing lytic or blastic lesions noted in the visualized portions of the skeleton.   VASCULAR MEASUREMENTS PERTINENT TO TAVR:   AORTA:   Minimal Aortic  Diameter-15 x 12 mm   Severity of Aortic Calcification-moderate to severe   RIGHT PELVIS:   Right Common Iliac Artery -   Minimal Diameter-10.4 x 7.7 mm   Tortuosity-mild   Calcification - mild   Right External  Iliac Artery -   Minimal Diameter-8.4 x 7.8 mm   Tortuosity-mild   Calcification-minimal   Right Common Femoral Artery -   Minimal Diameter-7.3 x 7.3 mm   Tortuosity-mild   Calcification-mild   LEFT PELVIS:   Left Common Iliac Artery -   Minimal Diameter-11.3 x 10.1 mm   Tortuosity-mild   Calcification-mild   Left External Iliac Artery -   Minimal Diameter-7.3 x 7.7 mm   Tortuosity-mild-to-moderate   Calcification-none   Left Common Femoral Artery -   Minimal Diameter-6.9 x 7.6 mm   Tortuosity-mild   Calcification-none   Review of the MIP images confirms the above findings.   IMPRESSION: 1. Vascular findings and measurements pertinent to potential TAVR procedure, as detailed above. 2. Severe thickening calcification of the aortic valve, compatible with reported clinical history of severe aortic stenosis. 3. Aortic atherosclerosis, in addition to left main and 3 vessel coronary artery disease. Status post median sternotomy for CABG including LIMA. 4. Cardiomegaly. 5. Colonic diverticulosis without evidence of acute diverticulitis at this time. 6. Additional incidental findings, as above.     Electronically Signed   By: Vinnie Langton M.D.   On: 06/29/2021 14:58    STS RISK CALCULATOR: Procedure: AVR + CAB Risk of Mortality: 5.628% Renal Failure: 2.815% Permanent Stroke: 3.693% Prolonged Ventilation: 17.540% DSW Infection: 0.358% Reoperation: 3.745% Morbidity or Mortality: 22.681% Short Length of Stay: 12.952% Long Length of Stay: 19.410%  Impression:  This 70 year old woman has stage D, severe, symptomatic aortic stenosis with New York Heart Association class II symptoms of exertional fatigue and shortness of  breath consistent with chronic diastolic congestive heart failure.  She is status post single-vessel coronary bypass graft surgery in 2004.  I have personally reviewed her 2D echocardiogram, cardiac catheterization, and CTA studies.  Her echocardiogram shows a trileaflet aortic valve with severe calcification and restricted leaflet mobility.  The mean gradient is 38 mmHg consistent with severe aortic stenosis.  There is moderate aortic insufficiency with a pressure half-time of 295 ms.  Left ventricular systolic function is normal.  She has severe mitral annular calcification and some calcification of the posterior leaflet with a mean mitral valve gradient of 4 mmHg consistent with mild mitral stenosis.  There is also mild mitral regurgitation.  Cardiac catheterization shows a chronically occluded right coronary artery with a patent saphenous vein graft to the distal right coronary.  This vein graft has ostial to proximal 50 to 70% narrowing.  The mean gradient across aortic valve was measured at 36 mmHg with a calculated valve area of 0.91 cm consistent with severe aortic stenosis.  There is moderate pulmonary hypertension at 45/21 with a mean wedge pressure of 28 and an LVEDP of 17.  I agree that aortic valve replacement is indicated in this patient for relief of her symptoms and to prevent progressive left ventricular deterioration.  Given her age, prior coronary bypass surgery, and comorbidities I think that transcatheter aortic valve replacement would be the best treatment for her.  Her gated cardiac CTA shows anatomy suitable for TAVR using either a 23 mm Edwards SAPIEN 3 valve or a 26 mm Medtronic Evolut Pro + valve.  There is a focal calcification of the sinotubular junction medially but I do not think that should cause any problem.  Her abdominal and pelvic CTA shows adequate pelvic vascular anatomy to allow transfemoral insertion.  The patient was counseled at length regarding treatment alternatives for  management of severe symptomatic aortic stenosis. The risks and benefits  of surgical intervention has been discussed in detail. Long-term prognosis with medical therapy was discussed. Alternative approaches such as conventional surgical aortic valve replacement, transcatheter aortic valve replacement, and palliative medical therapy were compared and contrasted at length. This discussion was placed in the context of the patient's own specific clinical presentation and past medical history. All of her questions have been addressed.   Following the decision to proceed with transcatheter aortic valve replacement, a discussion was held regarding what types of management strategies would be attempted intraoperatively in the event of life-threatening complications, including whether or not the patient would be considered a candidate for the use of cardiopulmonary bypass and/or conversion to open sternotomy for attempted surgical intervention.  I think she would be a candidate for emergent sternotomy to manage any intraoperative complications although she would be at significantly increased risk due to prior coronary bypass surgery. The patient is aware of the fact that transient use of cardiopulmonary bypass may be necessary. The patient has been advised of a variety of complications that might develop including but not limited to risks of death, stroke, paravalvular leak, aortic dissection or other major vascular complications, aortic annulus rupture, device embolization, cardiac rupture or perforation, mitral regurgitation, acute myocardial infarction, arrhythmia, heart block or bradycardia requiring permanent pacemaker placement, congestive heart failure, respiratory failure, renal failure, pneumonia, infection, other late complications related to structural valve deterioration or migration, or other complications that might ultimately cause a temporary or permanent loss of functional independence or other long term  morbidity. The patient provides full informed consent for the procedure as described and all questions were answered.      Plan:  She will be scheduled for transfemoral transcatheter aortic valve replacement on 07/13/2021.  We will discuss her case further at our multidisciplinary structural heart valve meeting to decide which valve type to use.  I spent 60 minutes performing this consultation and > 50% of this time was spent face to face counseling and coordinating the care of this patient's severe symptomatic aortic stenosis.   Gaye Pollack, MD 07/02/2021 3:34 PM

## 2021-07-06 ENCOUNTER — Other Ambulatory Visit (HOSPITAL_COMMUNITY): Payer: Self-pay

## 2021-07-06 ENCOUNTER — Encounter: Payer: Self-pay | Admitting: Physician Assistant

## 2021-07-06 ENCOUNTER — Other Ambulatory Visit: Payer: Self-pay | Admitting: Physician Assistant

## 2021-07-06 DIAGNOSIS — I350 Nonrheumatic aortic (valve) stenosis: Secondary | ICD-10-CM

## 2021-07-06 MED ORDER — PREDNISONE 50 MG PO TABS
ORAL_TABLET | ORAL | 0 refills | Status: DC
  Filled 2021-07-06: qty 2, 2d supply, fill #0

## 2021-07-07 ENCOUNTER — Other Ambulatory Visit (HOSPITAL_COMMUNITY): Payer: Self-pay

## 2021-07-07 ENCOUNTER — Other Ambulatory Visit: Payer: Self-pay | Admitting: Interventional Cardiology

## 2021-07-07 DIAGNOSIS — R42 Dizziness and giddiness: Secondary | ICD-10-CM | POA: Diagnosis not present

## 2021-07-07 DIAGNOSIS — E785 Hyperlipidemia, unspecified: Secondary | ICD-10-CM | POA: Diagnosis not present

## 2021-07-07 DIAGNOSIS — I2510 Atherosclerotic heart disease of native coronary artery without angina pectoris: Secondary | ICD-10-CM | POA: Diagnosis not present

## 2021-07-07 DIAGNOSIS — I639 Cerebral infarction, unspecified: Secondary | ICD-10-CM | POA: Diagnosis not present

## 2021-07-07 DIAGNOSIS — G4733 Obstructive sleep apnea (adult) (pediatric): Secondary | ICD-10-CM | POA: Diagnosis not present

## 2021-07-07 DIAGNOSIS — I350 Nonrheumatic aortic (valve) stenosis: Secondary | ICD-10-CM | POA: Diagnosis not present

## 2021-07-07 DIAGNOSIS — E119 Type 2 diabetes mellitus without complications: Secondary | ICD-10-CM | POA: Diagnosis not present

## 2021-07-07 DIAGNOSIS — D509 Iron deficiency anemia, unspecified: Secondary | ICD-10-CM | POA: Diagnosis not present

## 2021-07-07 MED FILL — Famotidine Tab 40 MG: ORAL | 30 days supply | Qty: 60 | Fill #2 | Status: AC

## 2021-07-08 ENCOUNTER — Other Ambulatory Visit (HOSPITAL_COMMUNITY): Payer: Self-pay

## 2021-07-08 MED ORDER — EZETIMIBE 10 MG PO TABS
10.0000 mg | ORAL_TABLET | Freq: Every day | ORAL | 3 refills | Status: DC
  Filled 2021-07-08: qty 90, 90d supply, fill #0
  Filled 2021-10-08: qty 90, 90d supply, fill #1
  Filled 2022-01-11: qty 90, 90d supply, fill #2
  Filled 2022-04-04: qty 90, 90d supply, fill #3

## 2021-07-08 NOTE — Pre-Procedure Instructions (Signed)
Surgical Instructions    Your procedure is scheduled on Tuesday, September 13th, 2022.  Report to [facility] Main Entrance "A" at 05:30 A.M., then check in with the Admitting office.  Call this number if you have problems the morning of surgery:  [phone]   If you have any questions prior to your surgery date call [phone]: Open Monday-Friday 8am-4pm    Remember:  Do not eat or drink after midnight the night before your surgery   Per your surgeon's office:  Continue taking all other medications without change through the day before surgery with the exception of Metformin which you should hold the day before surgery (last dose 9/11 PM).    Also please take prednisone '50mg'$  (this has been called into your pharmacy) at 6:30PM on 9/12 and 1:30 AM on 9/13. On the morning of surgery take no medications.      HOW TO MANAGE YOUR DIABETES BEFORE AND AFTER SURGERY  Why is it important to control my blood sugar before and after surgery? Improving blood sugar levels before and after surgery helps healing and can limit problems. A way of improving blood sugar control is eating a healthy diet by:  Eating less sugar and carbohydrates  Increasing activity/exercise  Talking with your doctor about reaching your blood sugar goals High blood sugars (greater than 180 mg/dL) can raise your risk of infections and slow your recovery, so you will need to focus on controlling your diabetes during the weeks before surgery. Make sure that the doctor who takes care of your diabetes knows about your planned surgery including the date and location.  How do I manage my blood sugar before surgery? Check your blood sugar at least 4 times a day, starting 2 days before surgery, to make sure that the level is not too high or low.  Check your blood sugar the morning of your surgery when you wake up and every 2 hours until you get to the Short Stay unit.  If your blood sugar is less than 70 mg/dL, you will  need to treat for low blood sugar: Treat a low blood sugar (less than 70 mg/dL) with  cup of clear juice (cranberry or apple), 4 glucose tablets, OR glucose gel. Recheck blood sugar in 15 minutes after treatment (to make sure it is greater than 70 mg/dL). If your blood sugar is not greater than 70 mg/dL on recheck, call [phone] for further instructions. Report your blood sugar to the short stay nurse when you get to Short Stay.  If you are admitted to the hospital after surgery: Your blood sugar will be checked by the staff and you will probably be given insulin after surgery (instead of oral diabetes medicines) to make sure you have good blood sugar levels. The goal for blood sugar control after surgery is 80-180 mg/dL.           Do not wear jewelry or makeup Do not wear lotions, powders, perfumes, or deodorant. Do not shave 48 hours prior to surgery.  Do not bring valuables to the hospital. DO Not wear nail polish, gel polish, artificial nails, or any other type of covering on natural nails including finger and toenails. If patients have artificial nails, gel coating, etc. that need to be removed by a nail salon please have this removed prior to surgery or surgery may need to be canceled/delayed if the surgeon/ anesthesia feels like the patient is unable to be adequately monitored.  [facility] is not responsible for any belongings or valuables.  Do NOT Smoke (Tobacco/Vaping)  24 hours prior to your procedure If you use a CPAP at night, you may bring all equipment for your overnight stay.   Contacts, glasses, dentures or bridgework may not be worn into surgery, please bring cases for these belongings   For patients admitted to the hospital, discharge time will be determined by your treatment team.   Patients discharged the day of surgery will not be allowed to drive home, and someone needs to stay with them for 24 hours.  ONLY 1 SUPPORT PERSON MAY BE PRESENT WHILE YOU  ARE IN SURGERY. IF YOU ARE TO BE ADMITTED ONCE YOU ARE IN YOUR ROOM YOU WILL BE ALLOWED TWO (2) VISITORS.  Minor children may have two parents present. Special consideration for safety and communication needs will be reviewed on a case by case basis.  Special instructions:    Oral Hygiene is also important to reduce your risk of infection.  Remember - BRUSH YOUR TEETH THE MORNING OF SURGERY WITH YOUR REGULAR TOOTHPASTE   [place]- Preparing For Surgery  Before surgery, you can play an important role. Because skin is not sterile, your skin needs to be as free of germs as possible. You can reduce the number of germs on your skin by washing with CHG (chlorahexidine gluconate) Soap before surgery.  CHG is an antiseptic cleaner which kills germs and bonds with the skin to continue killing germs even after washing.     Please do not use if you have an allergy to CHG or antibacterial soaps. If your skin becomes reddened/irritated stop using the CHG.  Do not shave (including legs and underarms) for at least 48 hours prior to first CHG shower. It is OK to shave your face.  Please follow these instructions carefully.     Shower the NIGHT BEFORE SURGERY and the MORNING OF SURGERY with CHG Soap.   If you chose to wash your hair, wash your hair first as usual with your normal shampoo. After you shampoo, rinse your hair and body thoroughly to remove the shampoo.  Then [employer] and genitals (private parts) with your normal soap and rinse thoroughly to remove soap.  After that Use CHG Soap as you would any other liquid soap. You can apply CHG directly to the skin and wash gently with a scrungie or a clean washcloth.   Apply the CHG Soap to your body ONLY FROM THE NECK DOWN.  Do not use on open wounds or open sores. Avoid contact with your eyes, ears, mouth and genitals (private parts). Wash Face and genitals (private parts)  with your normal soap.   Wash thoroughly, paying special attention to the area  where your surgery will be performed.  Thoroughly rinse your body with warm water from the neck down.  DO NOT shower/wash with your normal soap after using and rinsing off the CHG Soap.  Pat yourself dry with a CLEAN TOWEL.  Wear CLEAN PAJAMAS to bed the night before surgery  Place CLEAN SHEETS on your bed the night before your surgery  DO NOT SLEEP WITH PETS.   Day of Surgery:  Take a shower with CHG soap. Wear Clean/Comfortable clothing the morning of surgery Do not apply any deodorants/lotions.   Remember to brush your teeth WITH YOUR REGULAR TOOTHPASTE.   Please read over the following fact sheets that you were given.

## 2021-07-09 ENCOUNTER — Telehealth: Payer: Self-pay | Admitting: Interventional Cardiology

## 2021-07-09 ENCOUNTER — Other Ambulatory Visit: Payer: Self-pay

## 2021-07-09 ENCOUNTER — Other Ambulatory Visit (HOSPITAL_COMMUNITY): Payer: 59

## 2021-07-09 ENCOUNTER — Ambulatory Visit: Payer: 59 | Attending: Cardiovascular Disease

## 2021-07-09 ENCOUNTER — Ambulatory Visit (HOSPITAL_COMMUNITY)
Admission: RE | Admit: 2021-07-09 | Discharge: 2021-07-09 | Disposition: A | Discharge status: Home / Self Care | Payer: 59 | Source: Ambulatory Visit | Attending: Physician Assistant | Admitting: Physician Assistant

## 2021-07-09 ENCOUNTER — Encounter (HOSPITAL_COMMUNITY): Payer: Self-pay

## 2021-07-09 ENCOUNTER — Encounter (HOSPITAL_COMMUNITY)
Admission: RE | Admit: 2021-07-09 | Discharge: 2021-07-09 | Disposition: A | Discharge status: Home / Self Care | Payer: 59 | Source: Ambulatory Visit | Attending: Cardiovascular Disease | Admitting: Cardiovascular Disease

## 2021-07-09 DIAGNOSIS — R262 Difficulty in walking, not elsewhere classified: Secondary | ICD-10-CM | POA: Insufficient documentation

## 2021-07-09 DIAGNOSIS — I350 Nonrheumatic aortic (valve) stenosis: Secondary | ICD-10-CM | POA: Insufficient documentation

## 2021-07-09 DIAGNOSIS — Z20822 Contact with and (suspected) exposure to covid-19: Secondary | ICD-10-CM | POA: Insufficient documentation

## 2021-07-09 DIAGNOSIS — Z01818 Encounter for other preprocedural examination: Secondary | ICD-10-CM | POA: Diagnosis not present

## 2021-07-09 DIAGNOSIS — Z01811 Encounter for preprocedural respiratory examination: Secondary | ICD-10-CM

## 2021-07-09 HISTORY — DX: Cerebral infarction, unspecified: I63.9

## 2021-07-09 HISTORY — DX: Gastro-esophageal reflux disease without esophagitis: K21.9

## 2021-07-09 LAB — CBC
HCT: 35.7 % — ABNORMAL LOW (ref 36.0–46.0)
Hemoglobin: 11.1 g/dL — ABNORMAL LOW (ref 12.0–15.0)
MCH: 27.8 pg (ref 26.0–34.0)
MCHC: 31.1 g/dL (ref 30.0–36.0)
MCV: 89.3 fL (ref 80.0–100.0)
Platelets: 259 10*3/uL (ref 150–400)
RBC: 4 MIL/uL (ref 3.87–5.11)
RDW: 13.2 % (ref 11.5–15.5)
WBC: 6.9 10*3/uL (ref 4.0–10.5)
nRBC: 0 % (ref 0.0–0.2)

## 2021-07-09 LAB — BLOOD GAS, ARTERIAL
Acid-base deficit: 0.3 mmol/L (ref 0.0–2.0)
Bicarbonate: 23.8 mmol/L (ref 20.0–28.0)
Drawn by: 602861
FIO2: 21
O2 Saturation: 97.8 %
Patient temperature: 37
pCO2 arterial: 38.7 mmHg (ref 32.0–48.0)
pH, Arterial: 7.406 (ref 7.350–7.450)
pO2, Arterial: 98 mmHg (ref 83.0–108.0)

## 2021-07-09 LAB — URINALYSIS, MICROSCOPIC (REFLEX): Bacteria, UA: NONE SEEN

## 2021-07-09 LAB — COMPREHENSIVE METABOLIC PANEL
ALT: 23 U/L (ref 0–44)
AST: 25 U/L (ref 15–41)
Albumin: 3.5 g/dL (ref 3.5–5.0)
Alkaline Phosphatase: 69 U/L (ref 38–126)
Anion gap: 8 (ref 5–15)
BUN: 9 mg/dL (ref 8–23)
CO2: 22 mmol/L (ref 22–32)
Calcium: 9.3 mg/dL (ref 8.9–10.3)
Chloride: 108 mmol/L (ref 98–111)
Creatinine, Ser: 0.67 mg/dL (ref 0.44–1.00)
GFR, Estimated: >60 mL/min (ref >60)
Glucose, Bld: 117 mg/dL — ABNORMAL HIGH (ref 70–99)
Potassium: 3.7 mmol/L (ref 3.5–5.1)
Sodium: 138 mmol/L (ref 135–145)
Total Bilirubin: 0.4 mg/dL (ref 0.3–1.2)
Total Protein: 6.7 g/dL (ref 6.5–8.1)

## 2021-07-09 LAB — GLUCOSE, CAPILLARY: Glucose-Capillary: 131 mg/dL — ABNORMAL HIGH (ref 70–99)

## 2021-07-09 LAB — SURGICAL PCR SCREEN
MRSA, PCR: NEGATIVE
Staphylococcus aureus: NEGATIVE

## 2021-07-09 LAB — HEMOGLOBIN A1C
Hgb A1c MFr Bld: 6 % — ABNORMAL HIGH (ref 4.8–5.6)
Mean Plasma Glucose: 125.5 mg/dL

## 2021-07-09 LAB — PROTIME-INR
INR: 1 (ref 0.8–1.2)
Prothrombin Time: 13.3 seconds (ref 11.4–15.2)

## 2021-07-09 LAB — URINALYSIS, ROUTINE W REFLEX MICROSCOPIC
Bilirubin Urine: NEGATIVE
Glucose, UA: >=500 mg/dL — AB
Hgb urine dipstick: NEGATIVE
Ketones, ur: NEGATIVE mg/dL
Leukocytes,Ua: NEGATIVE
Nitrite: NEGATIVE
Protein, ur: NEGATIVE mg/dL
Specific Gravity, Urine: 1.01 (ref 1.005–1.030)
pH: 5.5 (ref 5.0–8.0)

## 2021-07-09 LAB — SARS CORONAVIRUS 2 (TAT 6-24 HRS): SARS Coronavirus 2: NEGATIVE

## 2021-07-09 NOTE — Progress Notes (Signed)
PCP - Josetta Huddle, MD Cardiologist - Tamala Julian, MD  PPM/ICD - n/a  Chest x-ray - 07/09/21 in PAT EKG - 07/09/21 in PAT Stress Test - 06/18/13 ECHO - 05/05/21 Cardiac Cath - 06/11/21  Sleep Study - pt denies  Fasting Blood Sugar - 99-104; 131 in PAT Checks Blood Sugar once a day  Instructions in TAVR Letter: Per your surgeon's office:   Continue taking all other medications without change through the day before surgery with the exception of Metformin which you should hold the day before surgery (last dose 9/11 PM).    Also please take prednisone '50mg'$  (this has been called into your pharmacy) at 6:30PM on 9/12 and 1:30 AM on 9/13. On the morning of surgery take no medications.     NPO at midnight  COVID TEST- 07/09/21 in PAT  Anesthesia review: yes, cardiac hx and "TIA on June 14th, 2022"; abnormal EKG  Patient denies shortness of breath, fever, cough and chest pain at PAT appointment   All instructions explained to the patient, with a verbal understanding of the material. Patient agrees to go over the instructions while at home for a better understanding. Patient also instructed to self quarantine after being tested for COVID-19. The opportunity to ask questions was provided.

## 2021-07-09 NOTE — Telephone Encounter (Signed)
Called to say that the patient had Glucose in her urine greater 500. Please advise

## 2021-07-09 NOTE — Progress Notes (Signed)
Notified Dr. Antionette Char office about pt's abnormal UA result: Glucose >500 in urine.

## 2021-07-09 NOTE — Telephone Encounter (Signed)
No new orders at this time. Proceed with TAVR as scheduled.

## 2021-07-09 NOTE — Therapy (Signed)
[place], [place], [address] Phone: [phone]   Fax:  [phone]  Physical Therapy Evaluation  Patient Details  Name: Deanna Simmons MRN: [id number] Date of Birth: [birth year] Referring Provider (PT): Sherren Mocha, MD   Encounter Date: 07/09/2021   PT End of Session - 07/09/21 0914     Visit Number 1    Number of Visits 1    Authorization Type UMR    PT Start Time 0830    PT Stop Time 0910    PT Time Calculation (min) 40 min    Equipment Utilized During Treatment Gait belt    Activity Tolerance Patient tolerated treatment well    Behavior During Therapy [facility] for tasks assessed/performed             Past Medical History:  Diagnosis Date   Abnormal finding on Pap smear, ASCUS    Anemia    Asthma    Chondromalacia of knee    Coronary artery disease    Diabetes mellitus without complication ([place])    Diverticulum 12/11/2002   in cecum   Fibroids    h/o   Glucose intolerance (impaired glucose tolerance)    H/O elevated lipids    H/O tinea cruris 02/2010   History of ASCVD    s/p CABG   Hypercholesteremia    Hypertension    Internal hemorrhoids    h/o   Irregular menses    h/o   MI (myocardial infarction) ([place]) age 30   Obesity    Pneumonia 07/2008   Synovitis of knee    Systemic lupus erythematosus ([place])    with nephritis   Vulvitis    h/o    Past Surgical History:  Procedure Laterality Date   CESAREAN SECTION     CORONARY ARTERY BYPASS GRAFT     Endometrial curettage  03/29/2000   RIGHT/LEFT HEART CATH AND CORONARY/GRAFT ANGIOGRAPHY N/A 06/11/2021   Procedure: RIGHT/LEFT HEART CATH AND CORONARY/GRAFT ANGIOGRAPHY;  Surgeon: Belva Crome, MD;  Location: [facility];  Service: Cardiovascular;  Laterality: N/A;   ROTATOR CUFF REPAIR     TUBAL LIGATION     UPPER GASTROINTESTINAL ENDOSCOPY  12/11/2002    There were no vitals filed for this visit.    Subjective  Assessment - 07/09/21 0836     Subjective Pt reports for evaluation leading up to her TAVR surgery. She reports no functional limitations such as SOB, angina, chest tightness or fatigue with or without exercise. Her primary c/o is L knee pain which limits her ability to walk long distances. She reports the reason for her having surgery is related to a chronic heart murmur lasting about 10 years that "they've been watching."    Pertinent History hx of CABG, HTN, diabetes, anemia    How long can you sit comfortably? 25 minutes due to L knee pain    How long can you stand comfortably? Unlimited    How long can you walk comfortably? Unlimited    Patient Stated Goals "get my heart straightened out"    Currently in Pain? No/denies                [facility] PT Assessment - 07/09/21 0001       Assessment   Medical Diagnosis TAVR    Referring Provider (PT) Sherren Mocha, MD    Onset Date/Surgical Date 07/10/11    Hand Dominance Right    Next MD Visit Later today  Prior Therapy Yes      Precautions   Precautions None      Restrictions   Weight Bearing Restrictions No      Balance Screen   Has the patient fallen in the past 6 months No    Has the patient had a decrease in activity level because of a fear of falling?  No    Is the patient reluctant to leave their home because of a fear of falling?  No      Home Ecologist residence    Living Arrangements Spouse/significant other    Available Help at Discharge Family    Type of [place] to enter    Entrance Stairs-Number of Steps 1    Entrance Stairs-Rails Right;Left;Can reach both    [place] Two level    Alternate Level Stairs-Number of Steps 14    Alternate Level Stairs-Rails Right;Can reach both    Home Equipment None      Prior Function   Level of [place] Full time employment    Vocation Requirements Prolonged standing, walking     Leisure [employer]      Cognition   Overall Cognitive Status Within Functional Limits for tasks assessed      ROM / Strength   AROM / PROM / Strength AROM      AROM   Overall AROM  Within functional limits for tasks performed      Strength   Overall Strength Within functional limits for tasks performed    Right Hand Grip (lbs) 62    Left Hand Grip (lbs) 64              [facility] Pre-Surgical Assessment - 07/09/21 0001     5 Meter Walk Test- trial 1 4 sec    5 Meter Walk Test- trial 2 5 sec.     5 Meter Walk Test- trial 3 5 sec.    5 meter walk test average 4.67 sec    4 Stage Balance Test tolerated for:  10 sec.    4 Stage Balance Test Position 4    Sit To Stand Test- trial 1 15 sec.    6 Minute Walk- Baseline yes    BP (mmHg) 105/62    HR (bpm) 70    02 Sat (%RA) 97 %    Modified Borg Scale for Dyspnea 0- Nothing at all    Perceived Rate of Exertion (Borg) 6-    6 Minute Walk Post Test yes    BP (mmHg) 143/71    HR (bpm) 107    02 Sat (%RA) 98 %    Modified Borg Scale for Dyspnea 0.5- Very, very slight shortness of breath    Perceived Rate of Exertion (Borg) 11- Fairly light    Aerobic Endurance Distance Walked 1255    Endurance additional comments 18.77% disability vs. age-related norm                      Objective measurements completed on examination: See above findings.                             Plan - 07/09/21 0915     Clinical Impression Statement One time visit    Consulted and Agree with Plan of Care Patient  Clinical Impression Statement: Pt is a 70 yo F presenting to OP PT for evaluation prior to possible TAVR surgery due to severe aortic stenosis. Pt reports onset of heart murmur approximately 10 years ago. Pt presents with WNL ROM and strength, WNL balance and is WNL for 4 stage balance test, WNL walking speed and WNL aerobic endurance per 6 minute walk test. Pt ambulated 1255 feet in 6 minutes. At  end of walk, patient's HR was 107 bpm and O2 was 98% on room air. Pt reported 0.5/10 shortness of breath on modified scale for dyspnea.  HR and BP increased moderately with 6 minute walk test. Based on the Short Physical Performance Battery, patient has a frailty rating of 10/12 with </= 5/12 considered frail.    Visit Diagnosis: Difficulty in walking, not elsewhere classified     Problem List Patient Active Problem List   Diagnosis Date Noted   Seasonal and perennial allergic rhinitis 03/07/2019   LPRD (laryngopharyngeal reflux disease) 03/07/2019   Asthma, well controlled 12/11/2018   Menopausal symptom 12/11/2018   Pain of breast 12/11/2018   Type 2 diabetes mellitus ([place]) 12/11/2018   Hammer toe of right foot 06/28/2018   Corns and callosities 06/28/2018   Bunion 06/28/2018   Chronic pain of left knee 09/06/2016   Right carotid bruit 08/03/2015   Severe aortic stenosis 07/18/2014   CAD (coronary artery disease) of artery bypass graft    Hypercholesteremia    Obesity    Anemia    Chondromalacia of knee    Synovitis of knee    Hypertension    Fibroids    Internal hemorrhoids    Diverticulum    Vulvitis    Old MI (myocardial infarction)     Vanessa , PT, DPT 07/09/21 9:19 AM   [place] [facility] [address] [place], [place], [address] Phone: [phone]   Fax:  [phone]  Name: Deanna Simmons MRN: [id number] Date of Birth: 10/08/1951

## 2021-07-10 ENCOUNTER — Other Ambulatory Visit (HOSPITAL_COMMUNITY): Payer: Self-pay

## 2021-07-12 ENCOUNTER — Telehealth: Payer: Self-pay | Admitting: Cardiovascular Disease

## 2021-07-12 MED ORDER — MAGNESIUM SULFATE 50 % IJ SOLN
40.0000 meq | INTRAMUSCULAR | Status: DC
  Filled 2021-07-12: qty 9.85

## 2021-07-12 MED ORDER — SODIUM CHLORIDE 0.9 % IV SOLN
INTRAVENOUS | Status: DC
  Filled 2021-07-12: qty 30

## 2021-07-12 MED ORDER — NOREPINEPHRINE 4 MG/250ML-% IV SOLN
0.0000 ug/min | INTRAVENOUS | Status: DC
  Filled 2021-07-12: qty 250

## 2021-07-12 MED ORDER — VANCOMYCIN HCL IN DEXTROSE 1-5 GM/200ML-% IV SOLN
1000.0000 mg | INTRAVENOUS | Status: AC
  Administered 2021-07-13: 1000 mg via INTRAVENOUS
  Filled 2021-07-12: qty 200

## 2021-07-12 MED ORDER — POTASSIUM CHLORIDE 2 MEQ/ML IV SOLN
80.0000 meq | INTRAVENOUS | Status: DC
  Filled 2021-07-12: qty 40

## 2021-07-12 MED ORDER — DEXMEDETOMIDINE HCL IN NACL 400 MCG/100ML IV SOLN
0.1000 ug/kg/h | INTRAVENOUS | Status: AC
Start: 2021-07-13 — End: 2021-07-13
  Administered 2021-07-13: 1 ug/kg/h via INTRAVENOUS
  Filled 2021-07-12: qty 100

## 2021-07-12 NOTE — Telephone Encounter (Signed)
Sangita from the [facility] needs to know how many units of blood to prepare for this patient's TAVR procedure tomorrow

## 2021-07-12 NOTE — H&P (Signed)
[name], [name], [name], MD   Reason for admission: Severe aortic stenosis   HPI:     The patient is a 70 year old woman with a history of hypertension, hyperlipidemia, type 2 diabetes, coronary artery disease status post coronary artery bypass graft surgery x1 with a saphenous vein graft to the PDA in 2004 by me, prior stroke with right retinal artery occlusion and more recent TIA in June 2022, degenerative joint disease involving her knees and requiring knee replacement, SLE, and aortic stenosis that has been followed with periodic echocardiograms.  She had a 2D echo in December 2021 that showed a trileaflet aortic valve with mean gradient of 34 mmHg and mild to moderate aortic insufficiency with a pressure half-time of 397 ms.  The valve area at that time was 0.89 cm.  There was mild to moderate mitral regurgitation with a mean mitral valve gradient of 3 mmHg with mild calcification of the posterior leaflet and moderate to severe mitral annular calcification.  She now has developed exertional shortness of breath and fatigue that she said mainly occurs with walking up stairs.  She works in Best boy at [employer] and is up on her feet most of the day.  She does not have any shortness of breath with walking on level ground but she said that she is very tired at the end of the day.  She denies any chest pain or pressure.  She denies orthopnea and PND.  She has had no peripheral edema.  She had a follow-up echo on 05/05/2021 which showed a mean gradient of 38 mmHg and a peak gradient of 66 mmHg across aortic valve.  Aortic valve regurgitation was felt to be mild to moderate with a pressure half-time of 295 ms.  Left ventricular ejection fraction is 55 to 60%.  The  mean mitral valve gradient was measured at 4 mmHg with a mitral valve area of 2.04 cm consistent with mild mitral stenosis.  There is mild mitral regurgitation.   She is married and has 4 adult children and 8 grandchildren.  She reports seeing her dentist regularly.  She has partial dentures and no current problems.       Past Medical History:  Diagnosis Date   Abnormal finding on Pap smear, ASCUS     Anemia     Asthma     Chondromalacia of knee     Coronary artery disease     Diabetes mellitus without complication ([place])     Diverticulum 12/11/2002    in cecum   Fibroids      h/o   Glucose intolerance (impaired glucose tolerance)     H/O elevated lipids     H/O tinea cruris 02/2010   History of ASCVD      s/p CABG   Hypercholesteremia     Hypertension     Internal hemorrhoids      h/o   Irregular menses      h/o   MI (myocardial infarction) ([place]) age 62   Obesity     Pneumonia 07/2008   Synovitis of knee     Systemic lupus erythematosus ([place])      with nephritis  Vulvitis      h/o           Past Surgical History:  Procedure Laterality Date   CESAREAN SECTION       CORONARY ARTERY BYPASS GRAFT       Endometrial curettage   03/29/2000   RIGHT/LEFT HEART CATH AND CORONARY/GRAFT ANGIOGRAPHY N/A 06/11/2021    Procedure: RIGHT/LEFT HEART CATH AND CORONARY/GRAFT ANGIOGRAPHY;  Surgeon: Belva Crome, MD;  Location: [facility];  Service: Cardiovascular;  Laterality: N/A;   ROTATOR CUFF REPAIR       TUBAL LIGATION       UPPER GASTROINTESTINAL ENDOSCOPY   12/11/2002           Family History  Problem Relation Age of Onset   Diabetes Mother     Stroke Mother     Breast cancer Mother 24   Prostate cancer Father     Diabetes Father     Hyperlipidemia Father     Heart attack Paternal Uncle          2 paternal uncles   Ovarian cancer Cousin        Social History         Socioeconomic History   Marital status: Married      Spouse name: Alvester Chou   Number of  children: 4   Years of education: 12+   Highest education level: Not on file  Occupational History   Occupation: [employer]      Employer: [employer]  Tobacco Use   Smoking status: Former   Smokeless tobacco: Never  Substance and Sexual Activity   Alcohol use: Yes      Comment: occasional   Drug use: No   Sexual activity: Not on file  Other Topics Concern   Not on file  Social History Narrative    Lives with her husband. Their children are grown, 1 lives in [place], 1 in [place], [place],  1 in [place], [place], and one in [place], [place].    Right handed    Social Determinants of Health    Financial Resource Strain: Not on file  Food Insecurity: Not on file  Transportation Needs: Not on file  Physical Activity: Not on file  Stress: Not on file  Social Connections: Not on file  Intimate Partner Violence: Not on file             Prior to Admission medications   Medication Sig Start Date End Date Taking? Authorizing Provider  albuterol (PROVENTIL HFA) 108 (90 Base) MCG/ACT inhaler Inhale 2 puffs into the lungs every 4  hours as needed for wheezing or shortness of breath.  Office visit needed for further refills. 06/22/21   Yes Kozlow, Donnamarie Poag, MD  albuterol (VENTOLIN HFA) 108 (90 Base) MCG/ACT inhaler INHALE 2 PUFFS BY MOUTH EVERY 4-6 HOURS AS NEEDED FOR COUGH OR WHEEZE 06/13/19   Yes Kozlow, Donnamarie Poag, MD  ALPRAZolam Duanne Moron) 0.25 MG tablet Take 1 tablet (0.25 mg total) by mouth 2 (two) times daily as needed for anxiety 04/20/21   Yes    aspirin 81 MG tablet Take 81 mg by mouth in the morning and at bedtime.     Yes [provider]  beclomethasone (QVAR REDIHALER) 80 MCG/ACT inhaler Use 3 puffs 3 times daily during asthma flares. 06/23/20   Yes Kozlow, Donnamarie Poag, MD  Blood Glucose Monitoring Suppl (FREESTYLE LITE) w/Device KIT USE TO TEST TWO TIMES DAILY PRIOR TO MEALS 01/20/21 01/20/22 Yes Josetta Huddle,  MD  cetirizine (ZYRTEC) 10 MG tablet Take 10 mg by mouth daily.      Yes [provider]  Cholecalciferol (VITAMIN D3) 2000 UNITS TABS Take 1 tablet by mouth daily. With Vitamin K2 in it     Yes [provider]  ciclopirox (PENLAC) 8 % solution APPLY TO THE AFFECTED AREA(S) AT BEDTIME AND EVERY 7 DAYS CLEAN NAILS WITH ALCOHOL 11/04/20 11/04/21 Yes Josetta Huddle, MD  dapagliflozin propanediol (FARXIGA) 10 MG TABS tablet Take 1 tablet by mouth once a day 02/18/21   Yes    Dextromethorphan-Guaifenesin (MUCINEX DM PO) Take 1 tablet by mouth as needed (colds).     Yes [provider]  Difluprednate (DUREZOL) 0.05 % EMUL Start 2 days prior to surgery. use 1 drop in right eye 4 times daily. use as directed after surgery 06/04/21   Yes Katy Apo, MD  Difluprednate (DUREZOL) 0.05 % EMUL Instill one drop into the right eye 4 times daily. 06/30/21   Yes Katy Apo, MD  Estradiol 10 MCG TABS vaginal tablet INSERT 1 TABLET TWICE A WEEK VAGINALLY AS DIRECTED 09/30/20 09/30/21 Yes Waymon Amato, MD  ezetimibe (ZETIA) 10 MG tablet Take 1 tablet (10 mg total) by mouth daily. Pt must keep upcoming appt in July for further refills 03/15/21   Yes Belva Crome, MD  famotidine (PEPCID) 40 MG tablet TAKE ONE TABLET BY MOUTH ONCE DAILY. INCREASE TO ONE TABLET TWICE DAILY DURING FLARE-UP. 10/21/20 10/21/21 Yes Kozlow, Donnamarie Poag, MD  ferrous sulfate 325 (65 FE) MG tablet Take 325 mg by mouth every other day.     Yes [provider]  fluticasone (FLONASE) 50 MCG/ACT nasal spray INSTILL 1 TO 2 SPRAYS INTO EACH NOSTRIL DAILY FOR STUFFY NOSE OR DRAINAGE Patient taking differently: Place 2 sprays into both nostrils daily as needed for allergies. 11/12/18   Yes Kozlow, Donnamarie Poag, MD  glucose blood test strip USE TO TEST TWICE A DAY BEFORE MEALS 01/20/21 01/20/22 Yes Josetta Huddle, MD  glucose blood test strip USE WHEN CHECKING BLOOD SUGAR TWICE DAILY PRIOR TO MEALS 12/16/20 12/16/21 Yes Josetta Huddle, MD  Lancets (FREESTYLE) lancets USE TO TEST TWO TIMES DAILY PRIOR TO MEALS  01/20/21 01/20/22 Yes Josetta Huddle, MD  magnesium gluconate (MAGONATE) 500 MG tablet Take 500 mg by mouth daily as needed (cramps).     Yes [provider]  metFORMIN (GLUCOPHAGE-XR) 500 MG 24 hr tablet TAKE 1 TABLET BY MOUTH TWICE DAILY WITH A MEAL 05/13/21   Yes    metoprolol succinate (TOPROL-XL) 25 MG 24 hr tablet TAKE 1 TABLET BY MOUTH TWICE DAILY 09/28/20 09/28/21 Yes Josetta Huddle, MD  Multiple Vitamins-Minerals (MULTIVITAL PO) Take 1 tablet by mouth daily. Anti ox     Yes [provider]  predniSONE (DELTASONE) 50 MG tablet Take 1 tablet (50 mg) 13 hours, 7 hours, and 1 hour prior to your CT scans. 06/18/21   Yes Sherren Mocha, MD  Probiotic Product (PROBIOTIC DAILY PO) Take 1 capsule by mouth daily. Ultra Flora IB     Yes [provider]  rosuvastatin (CRESTOR) 40 MG tablet Take 1 tablet (40 mg total) by mouth daily. Please keep upcoming appt in December with Dr. Tamala Julian for future refills. Thank you 09/24/19   Yes Belva Crome, MD  TRUE METRIX BLOOD GLUCOSE TEST test strip   12/25/15   Yes [provider]  TRUEPLUS LANCETS 30G MISC   12/25/15   Yes [provider]  valACYclovir (VALTREX) 500  MG tablet Take 500 mg by mouth daily as needed (cold sores/blisters). Reported on 12/01/2015     Yes [provider]  vitamin C (ASCORBIC ACID) 500 MG tablet Take 500 mg by mouth daily.     Yes [provider]  zinc gluconate 50 MG tablet Take 50 mg by mouth daily.     Yes [provider]  ADVAIR DISKUS 250-50 MCG/DOSE AEPB INHALE 1 PUFF INTO THE LUNGS IN THE MORNING AND AT BEDTIME. 06/09/20 06/11/21   Kozlow, Donnamarie Poag, MD  moxifloxacin (VIGAMOX) 0.5 % ophthalmic solution instil 1 drop in right eye 4 times a day starting 2 days prior to surgery & 2 drops morning of surgery Patient not taking: Reported on 07/02/2021 06/04/21     Katy Apo, MD            Current Outpatient Medications  Medication Sig Dispense Refill   albuterol  (PROVENTIL HFA) 108 (90 Base) MCG/ACT inhaler Inhale 2 puffs into the lungs every 4  hours as needed for wheezing or shortness of breath.  Office visit needed for further refills. 18 g 0   albuterol (VENTOLIN HFA) 108 (90 Base) MCG/ACT inhaler INHALE 2 PUFFS BY MOUTH EVERY 4-6 HOURS AS NEEDED FOR COUGH OR WHEEZE 18 g 1   ALPRAZolam (XANAX) 0.25 MG tablet Take 1 tablet (0.25 mg total) by mouth 2 (two) times daily as needed for anxiety 30 tablet 1   aspirin 81 MG tablet Take 81 mg by mouth in the morning and at bedtime.       beclomethasone (QVAR REDIHALER) 80 MCG/ACT inhaler Use 3 puffs 3 times daily during asthma flares. 10.6 g 5   Blood Glucose Monitoring Suppl (FREESTYLE LITE) w/Device KIT USE TO TEST TWO TIMES DAILY PRIOR TO MEALS 1 kit 3   cetirizine (ZYRTEC) 10 MG tablet Take 10 mg by mouth daily.       Cholecalciferol (VITAMIN D3) 2000 UNITS TABS Take 1 tablet by mouth daily. With Vitamin K2 in it       ciclopirox (PENLAC) 8 % solution APPLY TO THE AFFECTED AREA(S) AT BEDTIME AND EVERY 7 DAYS CLEAN NAILS WITH ALCOHOL 13.2 mL 1   dapagliflozin propanediol (FARXIGA) 10 MG TABS tablet Take 1 tablet by mouth once a day 90 tablet 3   Dextromethorphan-Guaifenesin (MUCINEX DM PO) Take 1 tablet by mouth as needed (colds).       Difluprednate (DUREZOL) 0.05 % EMUL Start 2 days prior to surgery. use 1 drop in right eye 4 times daily. use as directed after surgery 5 mL 1   Difluprednate (DUREZOL) 0.05 % EMUL Instill one drop into the right eye 4 times daily. 5 mL 1   Estradiol 10 MCG TABS vaginal tablet INSERT 1 TABLET TWICE A WEEK VAGINALLY AS DIRECTED 8 tablet 12   ezetimibe (ZETIA) 10 MG tablet Take 1 tablet (10 mg total) by mouth daily. Pt must keep upcoming appt in July for further refills 90 tablet 0   famotidine (PEPCID) 40 MG tablet TAKE ONE TABLET BY MOUTH ONCE DAILY. INCREASE TO ONE TABLET TWICE DAILY DURING FLARE-UP. 60 tablet 5   ferrous sulfate 325 (65 FE) MG tablet Take 325 mg by mouth every  other day.       fluticasone (FLONASE) 50 MCG/ACT nasal spray INSTILL 1 TO 2 SPRAYS INTO EACH NOSTRIL DAILY FOR STUFFY NOSE OR DRAINAGE (Patient taking differently: Place 2 sprays into both nostrils daily as needed for allergies.) 48 g 5   glucose blood  test strip USE TO TEST TWICE A DAY BEFORE MEALS 200 strip 3   glucose blood test strip USE WHEN CHECKING BLOOD SUGAR TWICE DAILY PRIOR TO MEALS 200 strip 3   Lancets (FREESTYLE) lancets USE TO TEST TWO TIMES DAILY PRIOR TO MEALS 200 each 3   magnesium gluconate (MAGONATE) 500 MG tablet Take 500 mg by mouth daily as needed (cramps).       metFORMIN (GLUCOPHAGE-XR) 500 MG 24 hr tablet TAKE 1 TABLET BY MOUTH TWICE DAILY WITH A MEAL 180 tablet 3   metoprolol succinate (TOPROL-XL) 25 MG 24 hr tablet TAKE 1 TABLET BY MOUTH TWICE DAILY 180 tablet 3   Multiple Vitamins-Minerals (MULTIVITAL PO) Take 1 tablet by mouth daily. Anti ox       predniSONE (DELTASONE) 50 MG tablet Take 1 tablet (50 mg) 13 hours, 7 hours, and 1 hour prior to your CT scans. 3 tablet 0   Probiotic Product (PROBIOTIC DAILY PO) Take 1 capsule by mouth daily. Ultra Flora IB       rosuvastatin (CRESTOR) 40 MG tablet Take 1 tablet (40 mg total) by mouth daily. Please keep upcoming appt in December with Dr. Tamala Julian for future refills. Thank you 90 tablet 0   TRUE METRIX BLOOD GLUCOSE TEST test strip     3   TRUEPLUS LANCETS 30G MISC     3   valACYclovir (VALTREX) 500 MG tablet Take 500 mg by mouth daily as needed (cold sores/blisters). Reported on 12/01/2015       vitamin C (ASCORBIC ACID) 500 MG tablet Take 500 mg by mouth daily.       zinc gluconate 50 MG tablet Take 50 mg by mouth daily.       ADVAIR DISKUS 250-50 MCG/DOSE AEPB INHALE 1 PUFF INTO THE LUNGS IN THE MORNING AND AT BEDTIME. 60 each 5   moxifloxacin (VIGAMOX) 0.5 % ophthalmic solution instil 1 drop in right eye 4 times a day starting 2 days prior to surgery & 2 drops morning of surgery (Patient not taking: Reported on 07/02/2021) 3  mL 1    No current facility-administered medications for this visit.           Allergies  Allergen Reactions   Biaxin [Clarithromycin] Itching   Cardiolite [Technetium-4m Itching   Ciprofloxacin Itching   Erythromycin Base Itching   Montelukast Sodium Itching   Niaspan [Niacin Er] Other (See Comments)      flushing   Penicillins Itching      Reaction: over 10 years   Prilosec [Omeprazole] Itching   Singulair [Montelukast] Hives and Itching   Ticlid [Ticlopidine Hcl] Itching   Iodinated Diagnostic Agents Itching      Pt has a potential contrast allergy; She states in 2012 her cardiologist told her she had a reaction during the "study". We are unable to determine the study or confirm a reaction. The patient will have a 13 hr prep for future studies. Molecular Imaging states their injection does not cause any reactions.          Review of Systems:               General:                      + decreased appetite, + decreased energy, no weight gain, + weight loss, no fever             Cardiac:  no chest pain with exertion, no chest pain at rest, +SOB with moderate exertion, no resting SOB, no PND, no orthopnea, no palpitations, no arrhythmia, no atrial fibrillation, no LE edema, no dizzy spells, no syncope             Respiratory:                 + exertional shortness of breath, no home oxygen, no productive cough, no dry cough, no bronchitis, no wheezing, no hemoptysis, + asthma, no pain with inspiration or cough, no sleep apnea, no CPAP at night             GI:                               no difficulty swallowing, + reflux, no frequent heartburn, no hiatal hernia, no abdominal pain, no constipation, no diarrhea, no hematochezia, no hematemesis, no melena             GU:                              no dysuria,  no frequency, no urinary tract infection, no hematuria, no kidney stones, no kidney disease             Vascular:                     no pain suggestive  of claudication, no pain in feet, + leg cramps, no varicose veins, no DVT, no non-healing foot ulcer             Neuro:                         + stroke, + TIA's, no seizures, no headaches, no temporary blindness one eye,  no slurred speech, no peripheral neuropathy, no chronic pain, + instability of gait, no memory/cognitive dysfunction             Musculoskeletal:         + arthritis - primarily involving the knees, no joint swelling, no myalgias, + difficulty walking, + decreased mobility              Skin:                            no rash, no itching, no skin infections, no pressure sores or ulcerations             Psych:                         no anxiety, no depression, no nervousness, no unusual recent stress             Eyes:                           no blurry vision, no floaters, no recent vision changes, + wears glasses              ENT:                            no hearing loss, no loose or painful teeth, + dentures, last saw dentist 01/2021  Hematologic:               no easy bruising, no abnormal bleeding, no clotting disorder, no frequent epistaxis             Endocrine:                   + diabetes, does check CBG's at home                            Physical Exam:               BP 117/72 (BP Location: Right Arm, Patient Position: Sitting)   Pulse 87   Resp 20   Ht 5' 5"  (1.651 m)   Wt 217 lb (98.4 kg)   SpO2 98% Comment: RA  BMI 36.11 kg/m              General:                      well-appearing             HEENT:                       Unremarkable, NCAT, PERLA, EOMI             Neck:                           no JVD, no bruits, no adenopathy              Chest:                          clear to auscultation, symmetrical breath sounds, no wheezes, no rhonchi              CV:                              RRR, 3/6 systolic murmur RSB, No diastolic murmur             Abdomen:                    soft, non-tender, no masses              Extremities:                  warm, well-perfused, pulses palpable at ankle, no lower extremity edema             Rectal/GU                   Deferred             Neuro:                         Grossly non-focal and symmetrical throughout             Skin:                            Clean and dry, no rashes, no breakdown   Diagnostic Tests:     ECHOCARDIOGRAM REPORT         Patient Name:   Deanna Simmons Date of Exam: 05/05/2021  Medical Rec #:  [id number]       Height:       64.0 in  Accession #:    [id number]      Weight:       221.4 lb  Date of Birth:  08-Feb-1951       BSA:          2.043 m  Patient Age:    67 years        BP:           110/72 mmHg  Patient Gender: F               HR:           66 bpm.  Exam Location:  [facility]   Procedure: 2D Echo, 3D Echo, Cardiac Doppler, Color Doppler and Strain  Analysis   Indications:    G45.9 TIA     History:        Patient has prior history of Echocardiogram examinations,  most                  recent 10/22/2020. CAD and Previous Myocardial Infarction,                  Aortic Valve Disease; Risk Factors:Hypertension, Diabetes,                  Dyslipidemia, Former Smoker and Obesity. Anemia.     Sonographer:    Jessee Avers, RDCS  Referring Phys: [id number] ADAM R JAFFE   IMPRESSIONS     1. Severe aortic stenosis is present. V max 4.06 m/s, MG 38 mmHG. AVA  over estimated (1.1 cm2) as LVOT PW signal obtained too close to the valve  (LVOT VTI 39 cm). Based on Vmax and mean gradient, aortic stenosis is  severe. There is mild to moderate  aortic regurgitation, but this is not well visualized. The aortic valve is  tricuspid. There is severe calcifcation of the aortic valve. There is  severe thickening of the aortic valve. Aortic valve regurgitation is mild  to moderate. Severe aortic valve  stenosis.   2. Left ventricular ejection fraction, by estimation, is 55 to 60%. Left  ventricular ejection fraction by 3D volume is 57 %. The left ventricle has  normal  function. The left ventricle has no regional wall motion  abnormalities. Left ventricular diastolic   function could not be evaluated. The average left ventricular global  longitudinal strain is -22.2 %. The global longitudinal strain is normal.   3. Right ventricular systolic function is normal. The right ventricular  size is normal. There is normal pulmonary artery systolic pressure. The  estimated right ventricular systolic pressure is 09.3 mmHg.   4. Left atrial size was mild to moderately dilated.   5. Mild calcific mitral stenosis. MVA by VTI 2.04 cm2. MG 4 mmHG @ 64  bpm. The mitral valve is degenerative. Mild mitral valve regurgitation.  Mild mitral stenosis. The mean mitral valve gradient is 4.0 mmHg. Moderate  to severe mitral annular  calcification.   6. The inferior vena cava is normal in size with greater than 50%  respiratory variability, suggesting right atrial pressure of 3 mmHg.   Comparison(s): Changes from prior study are noted. Aortic stenosis is now  severe.   FINDINGS   Left Ventricle: Left ventricular ejection fraction, by estimation, is 55  to 60%. Left ventricular ejection fraction by 3D volume is 57 %. The left  ventricle has normal function. The left ventricle  has no regional wall  motion abnormalities. The average  left ventricular global longitudinal strain is -22.2 %. The global  longitudinal strain is normal. The left ventricular internal cavity size  was normal in size. There is no left ventricular hypertrophy. Left  ventricular diastolic function could not be  evaluated due to mitral annular calcification (moderate or greater). Left  ventricular diastolic function could not be evaluated.   Right Ventricle: The right ventricular size is normal. No increase in  right ventricular wall thickness. Right ventricular systolic function is  normal. There is normal pulmonary artery systolic pressure. The tricuspid  regurgitant velocity is 2.73 m/s, and   with  an assumed right atrial pressure of 3 mmHg, the estimated right  ventricular systolic pressure is 81.1 mmHg.   Left Atrium: Left atrial size was mild to moderately dilated.   Right Atrium: Right atrial size was normal in size.   Pericardium: Trivial pericardial effusion is present.   Mitral Valve: Mild calcific mitral stenosis. MVA by VTI 2.04 cm2. MG 4  mmHG @ 64 bpm. The mitral valve is degenerative in appearance. Moderate to  severe mitral annular calcification. Mild mitral valve regurgitation. Mild  mitral valve stenosis. MV peak  gradient, 11.7 mmHg. The mean mitral valve gradient is 4.0 mmHg with  average heart rate of 64 bpm.   Tricuspid Valve: The tricuspid valve is grossly normal. Tricuspid valve  regurgitation is mild . No evidence of tricuspid stenosis.   Aortic Valve: Severe aortic stenosis is present. V max 4.06 m/s, MG 38  mmHG. AVA over estimated (1.1 cm2) as LVOT PW signal obtained too close to  the valve (LVOT VTI 39 cm). Based on Vmax and mean gradient, aortic  stenosis is severe. There is mild to  moderate aortic regurgitation, but this is not well visualized. The aortic  valve is tricuspid. There is severe calcifcation of the aortic valve.  There is severe thickening of the aortic valve. Aortic valve regurgitation  is mild to moderate. Aortic  regurgitation PHT measures 295 msec. Severe aortic stenosis is present.  Aortic valve mean gradient measures 38.0 mmHg. Aortic valve peak gradient  measures 65.9 mmHg. Aortic valve area, by VTI measures 1.11 cm.   Pulmonic Valve: The pulmonic valve was grossly normal. Pulmonic valve  regurgitation is trivial. No evidence of pulmonic stenosis.   Aorta: The aortic root and ascending aorta are structurally normal, with  no evidence of dilitation.   Venous: The inferior vena cava is normal in size with greater than 50%  respiratory variability, suggesting right atrial pressure of 3 mmHg.   IAS/Shunts: There is right  bowing of the interatrial septum, suggestive of  elevated left atrial pressure. The atrial septum is grossly normal.      LEFT VENTRICLE  PLAX 2D  LVIDd:         5.20 cm         Diastology  LVIDs:         3.20 cm         LV e' medial:    4.47 cm/s  LV PW:         1.00 cm         LV E/e' medial:  31.1  LV IVS:        1.00 cm         LV e' lateral:   4.12 cm/s  LVOT diam:     2.00 cm         LV E/e' lateral:  33.7  LV SV:         123  LV SV Index:   60              2D  LVOT Area:     3.14 cm        Longitudinal                                 Strain                                 2D Strain GLS  -19.3 %                                 (A2C):                                 2D Strain GLS  -22.5 %                                 (A3C):                                 2D Strain GLS  -24.9 %                                 (A4C):                                 2D Strain GLS  -22.2 %                                 Avg:                                    3D Volume EF                                 LV 3D EF:    Left                                              ventricular                                              ejection                                              fraction by  3D volume                                              is 57 %.                                    3D Volume EF:                                 3D EF:        57 %                                 LV EDV:       166 ml                                 LV ESV:       71 ml                                 LV SV:        95 ml   RIGHT VENTRICLE  RV Basal diam:  3.40 cm  RV S prime:     8.25 cm/s  TAPSE (M-mode): 2.0 cm  RVSP:           32.8 mmHg   LEFT ATRIUM              Index       RIGHT ATRIUM           Index  LA diam:        4.80 cm  2.35 cm/m  RA Pressure: 3.00 mmHg  LA Vol (A2C):   62.6 ml  30.65 ml/m RA Area:     12.60 cm  LA Vol (A4C):   102.0 ml 49.93  ml/m RA Volume:   28.30 ml  13.85 ml/m  LA Biplane Vol: 83.8 ml  41.02 ml/m   AORTIC VALVE  AV Area (Vmax):    1.08 cm  AV Area (Vmean):   1.15 cm  AV Area (VTI):     1.11 cm  AV Vmax:           406.00 cm/s  AV Vmean:          280.667 cm/s  AV VTI:            1.100 m  AV Peak Grad:      65.9 mmHg  AV Mean Grad:      38.0 mmHg  LVOT Vmax:         139.00 cm/s  LVOT Vmean:        103.000 cm/s  LVOT VTI:          0.390 m  LVOT/AV VTI ratio: 0.35  AI PHT:            295 msec     AORTA  Ao Root diam: 3.20 cm  Ao Asc diam:  3.50 cm   MITRAL VALVE                TRICUSPID VALVE  TR Peak grad:   29.8 mmHg                              TR Vmax:        273.00 cm/s  MV Peak grad:  11.7 mmHg    Estimated RAP:  3.00 mmHg  MV Mean grad:  4.0 mmHg     RVSP:           32.8 mmHg  MV Vmax:       1.71 m/s  MV Vmean:      97.9 cm/s    SHUNTS  MV VTI:        0.57 m       Systemic VTI:  0.39 m  MV Decel Time: 370 msec     Systemic Diam: 2.00 cm  MV E velocity: 139.00 cm/s  MV A velocity: 163.00 cm/s  MV E/A ratio:  0.85   Eleonore Chiquito MD  Electronically signed by Eleonore Chiquito MD  Signature Date/Time: 05/05/2021/11:16:25 AM       Physicians   Panel Physicians Referring Physician Case Authorizing Physician  Belva Crome, MD (Primary)        Procedures   RIGHT/LEFT HEART CATH AND CORONARY/GRAFT ANGIOGRAPHY    Conclusion       Ost RCA to Mid RCA lesion is 100% stenosed.   Prox Cx lesion is 30% stenosed.   Mid LM to Dist LM lesion is 20% stenosed.   Ost LAD lesion is 20% stenosed.   Origin lesion is 60% stenosed.   LV end diastolic pressure is moderately elevated.   LV end diastolic pressure is normal.   Hemodynamic findings consistent with moderate pulmonary hypertension.   There is severe aortic valve stenosis.   There is moderate mitral valve stenosis and mild (2+) mitral regurgitation.   Severe aortic stenosis with mean gradient 36 mmHg and  calculated aortic valve area 0.91 cm. 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  V wave 47 mmHg.  A simultaneous gradient across the mitral valve was not recorded (however, mean wedge pressure of 28 mmHg minus LVEDP 17 mmHg = 11 mmHg, likely overestimating gradient due to V wave). Total occlusion of RCA. Patent left main Patent LAD Patent circumflex Patent 70 year old SVG to distal RCA with ostial proximal 50 to 70% narrowing.   RECOMMENDATIONS:   Referred to the structural heart team for consideration of TAVR and opinion concerning mitral valve.   Recommendations   Antiplatelet/Anticoag Recommend Aspirin 58m daily for moderate CAD.  Discharge Date In the absence of any other complications or medical issues, we expect the patient to be ready for discharge on 06/11/2021.    Surgeon Notes       06/11/2021  9:18 AM CV Procedure signed by SBelva Crome MD    Indications   Nonrheumatic mitral valve stenosis with insufficiency [I34.2, I34.0 (ICD-10-CM)]  Severe aortic stenosis [I35.0 (ICD-10-CM)]    Procedural Details   Technical Details The right radial area was sterilely prepped and draped. Intravenous sedation with Versed and fentanyl was administered. 1% Xylocaine was infiltrated to achieve local analgesia. Using real-time vascular ultrasound, a double wall stick with an angiocath was utilized to obtain intra-arterial access. A VUS image was saved for the permanent record.The modified Seldinger technique was used to place a 10F " Slender" sheath in the right radial artery. Weight based heparin was administered.  Coronary angiography was done using 5 F catheters. Right coronary angiography was performed with a JR4. Left ventricular hemodymic recordings and angiography was done using the JR 4 catheter and hand injection. Left coronary angiography was performed with a JL 3.5 cm.  Right heart catheterization  was performed by exchanging a previously placed antecubital IV angio-cath for a 5 French Slender sheath. 1% Xylocaine was used to locally nesthetize the area around the IV site. The IV catheter was wired using an .018 guidewire. The modified Seldinger technique was used to place the 5 [place] sheath. Double glove technique was used to enhance sterility. After sheath insertion, right heart cath was performed using a 5 French balloon tipped catheter and fluoroscopic guidance. Pressures were recorded in each chamber and in the pulmonary capillary wedge position.. The main pulmonary artery O2 saturation was sampled.   Hemostasis was achieved using a pneumatic band.  During this procedure the patient is administered a total of Versed 1.5 mg and Fentanyl 75 mcg to achieve and maintain moderate conscious sedation.  The patient's heart rate, blood pressure, and oxygen saturation are monitored continuously during the procedure. The period of conscious sedation is 35 minutes, of which I was present face-to-face 100% of this time.  Estimated blood loss <50 mL.   During this procedure medications were administered to achieve and maintain moderate conscious sedation while the patient's heart rate, blood pressure, and oxygen saturation were continuously monitored and I was present face-to-face 100% of this time.    Medications (Filter: Administrations occurring from 0741 to 0850 on 06/11/21) Heparin (Porcine) in NaCl 1000-0.9 UT/500ML-% SOLN (mL) Total volume:  1,000 mL Date/Time Rate/Dose/Volume Action    06/11/21 0744 500 mL Given    0744 500 mL Given      fentaNYL (SUBLIMAZE) injection (mcg) Total dose:  75 mcg Date/Time Rate/Dose/Volume Action    06/11/21 0745 50 mcg Given    0757 25 mcg Given      midazolam (VERSED) injection (mg) Total dose:  1.5 mg Date/Time Rate/Dose/Volume Action    06/11/21 0745 1 mg Given    0757 0.5 mg Given      lidocaine (PF) (XYLOCAINE) 1 % injection (mL) Total volume:   4 mL Date/Time Rate/Dose/Volume Action    06/11/21 0749 4 mL Given      Radial Cocktail/Verapamil only (mL) Total volume:  10 mL Date/Time Rate/Dose/Volume Action    06/11/21 0757 10 mL Given      heparin sodium (porcine) injection (Units) Total dose:  5,000 Units Date/Time Rate/Dose/Volume Action    06/11/21 0805 5,000 Units Given      iohexol (OMNIPAQUE) 350 MG/ML injection (mL) Total volume:  115 mL Date/Time Rate/Dose/Volume Action    06/11/21 0836 115 mL Given      Sedation Time   Sedation Time Physician-1: 49 minutes 55 seconds Contrast   Medication Name Total Dose  iohexol (OMNIPAQUE) 350 MG/ML injection 115 mL    Radiation/Fluoro   Fluoro time: 16.1 (min) DAP: 37801 (mGycm2) Cumulative Air Kerma: 568 (mGy) Coronary Findings   Diagnostic Dominance: Right Left Main  Mid LM to Dist LM lesion is 20% stenosed.  Left Anterior Descending  The vessel exhibits minimal luminal irregularities.  Ost LAD lesion is 20% stenosed.  Left Circumflex  Prox Cx lesion is 30% stenosed.  Right Coronary Artery  The vessel exhibits minimal luminal irregularities.  Ost RCA to Mid RCA lesion is 100% stenosed. The lesion was previously treated .  Right Posterior Descending Artery  The  vessel exhibits minimal luminal irregularities.  Graft To RPDA  Origin lesion is 60% stenosed.  Intervention   No interventions have been documented. Right Heart   Right Heart Pressures Hemodynamic findings consistent with moderate pulmonary hypertension. LV EDP is normal.  Right Atrium Right atrial pressure is normal.    Left Heart   Left Ventricle LV end diastolic pressure is moderately elevated.  Mitral Valve There is moderate mitral valve stenosis and mild (2+) mitral regurgitation. Heavy mitral annulus calcification  Aortic Valve There is severe aortic valve stenosis. The aortic valve is calcified. There is restricted aortic valve motion.    Coronary Diagrams   Diagnostic Dominance:  Right Intervention   Implants      No implant documentation for this case.    Syngo Images    Show images for CARDIAC CATHETERIZATION Images on Long Term Storage    Show images for [place], [place] to Procedure Log   Procedure Log    Hemo Data   Flowsheet Row Most Recent Value  Fick Cardiac Output 5.88 L/min  Fick Cardiac Output Index 2.88 (L/min)/BSA  Aortic Mean Gradient 36.07 mmHg  Aortic Peak Gradient 36 mmHg  Aortic Valve Area 0.91  Aortic Value Area Index 0.45 cm2/BSA  RA A Wave 9 mmHg  RA V Wave 8 mmHg  RA Mean 5 mmHg  RV Systolic Pressure 44 mmHg  RV Diastolic Pressure 1 mmHg  RV EDP 8 mmHg  PA Systolic Pressure 45 mmHg  PA Diastolic Pressure 21 mmHg  PA Mean 35 mmHg  PW A Wave 29 mmHg  PW V Wave 47 mmHg  PW Mean 28 mmHg  AO Systolic Pressure 945 mmHg  AO Diastolic Pressure 63 mmHg  AO Mean 94 mmHg  LV Systolic Pressure 038 mmHg  LV Diastolic Pressure 1 mmHg  LV EDP 17 mmHg  AOp Systolic Pressure 882 mmHg  AOp Diastolic Pressure 64 mmHg  AOp Mean Pressure 99 mmHg  LVp Systolic Pressure 800 mmHg  LVp Diastolic Pressure 2 mmHg  LVp EDP Pressure 17 mmHg  QP/QS 1  TPVR Index 12.12 HRUI  TSVR Index 32.56 HRUI  PVR SVR Ratio 0.08  TPVR/TSVR Ratio 0.37      ADDENDUM REPORT: 06/30/2021 19:29   CLINICAL DATA:  Severe Aortic Stenosis.   EXAM: Cardiac TAVR CT   TECHNIQUE: A non-contrast, gated CT scan was obtained with axial slices of 3 mm through the heart for aortic valve calcium scoring. A 120 kV retrospective, gated, contrast cardiac scan was obtained. Gantry rotation speed was 250 msecs and collimation was 0.6 mm. Nitroglycerin was not given. The 3D data set was reconstructed in 5% intervals of the 0-95% of the R-R cycle. Systolic and diastolic phases were analyzed on a dedicated workstation using MPR, MIP, and VRT modes. The patient received 100 cc of contrast.   FINDINGS: Image quality: Average.  PVC noted during the exam.   Noise  artifact is: Limited.   Valve Morphology: The aortic valve is tricuspid with severe calcifications and restricted movement in systole. The calcifications appear diffuse.   Aortic Valve Calcium score: 1696   Aortic annular dimension:   Phase assessed: 15%   Annular area: 409 mm2   Annular perimeter: 72.6 mm   Max diameter: 24.7 mm   Min diameter: 21.3 mm   Annular and subannular calcification: None.   Optimal coplanar projection: LAO 3 CRA 22   Coronary Artery Height above Annulus:   Left Main: 17.7 mm   Right Coronary: 16.1  mm   Sinus of Valsalva Measurements:   Non-coronary: 27.5 mm   Right-coronary: 24.9 mm   Left-coronary: 28.1 mm   Average diameter: 27.0 mm   Sinus of Valsalva Height:   Non-coronary: 19.3 mm   Right-coronary: 24.0 mm   Left-coronary: 24.5 mm   Sinotubular Junction: 28 mm with a single severe protruding calcification noted.   Ascending Thoracic Aorta: 35 mm.   Coronary Arteries: Normal coronary origin. Right dominance. The study was performed without use of NTG and is insufficient for plaque evaluation. Please refer to recent cardiac catheterization for coronary assessment. 3-vessel coronary calcifications noted. SVG-RCA noted.   Cardiac Morphology:   Right Atrium: Right atrial size is within normal limits.   Right Ventricle: The right ventricular cavity is within normal limits.   Left Atrium: Left atrial size is normal in size with no left atrial appendage filling defect.   Left Ventricle: The ventricular cavity size is within normal limits. There are no stigmata of prior infarction. There is no abnormal filling defect.   Pulmonary arteries: Normal in size without proximal filling defect.   Pulmonary veins: Normal pulmonary venous drainage.   Pericardium: Normal thickness with no significant effusion or calcium present.   Mitral Valve: The mitral valve is degenerative with moderate to severe annular calcium.    Extra-cardiac findings: See attached radiology report for non-cardiac structures.   IMPRESSION: 1. Tricuspid aortic valve with diffuse severe calcifications. Severe aortic stenosis with calcium score 1696.   2. Difficult study with PVC noted during systole but annular measurements support a 23 mm Edwards Sapien 3 (409 mm2).   3. Severe protruding calcification noted at the STJ which should be considered for self expanding valve.   4. No significant annular or subannular calcifications.   5. Sufficient coronary to annulus distance.   6. Optimal Fluoroscopic Angle for Delivery: LAO 3 CRA 45    T. Audie Box, MD     Electronically Signed   By: Eleonore Chiquito M.D.   On: 06/30/2021 19:29    Addended by Geralynn Rile, MD on 06/30/2021  7:31 PM    Study Result   Narrative & Impression  EXAM: OVER-READ INTERPRETATION  CT CHEST   The following report is an over-read performed by radiologist Dr. Vinnie Langton of [facility], [place] on 06/29/2021. This over-read does not include interpretation of cardiac or coronary anatomy or pathology. The coronary calcium score/coronary CTA interpretation by the cardiologist is attached.   COMPARISON:  None.   FINDINGS: Extracardiac findings will be described separately under dictation for contemporaneously obtained CTA chest, abdomen and pelvis.   IMPRESSION: Please see separate dictation for contemporaneously obtained CTA chest, abdomen and pelvis dated 06/29/2021 for full description of relevant extracardiac findings.   Electronically Signed: By: Vinnie Langton M.D. On: 06/29/2021 11:47      Narrative & Impression  CLINICAL DATA:  70 year old female with history of severe aortic stenosis. Preprocedural study prior to potential transcatheter aortic valve replacement (TAVR) procedure.   EXAM: CT ANGIOGRAPHY CHEST, ABDOMEN AND PELVIS   TECHNIQUE: Multidetector CT imaging through the chest, abdomen and pelvis  was performed using the standard protocol during bolus administration of intravenous contrast. Multiplanar reconstructed images and MIPs were obtained and reviewed to evaluate the vascular anatomy.   CONTRAST:  183m OMNIPAQUE IOHEXOL 350 MG/ML SOLN   COMPARISON:  No priors.   FINDINGS: CTA CHEST FINDINGS   Cardiovascular: Heart size is enlarged. There is no significant pericardial fluid, thickening or pericardial calcification. There  is aortic atherosclerosis, as well as atherosclerosis of the great vessels of the mediastinum and the coronary arteries, including calcified atherosclerotic plaque in the left main, left anterior descending, left circumflex and right coronary arteries. Severe thickening calcification of the aortic valve. Severe calcifications of the mitral annulus. Status post median sternotomy for CABG.   Mediastinum/Lymph Nodes: No pathologically enlarged mediastinal or hilar lymph nodes. Esophagus is unremarkable in appearance. No axillary lymphadenopathy.   Lungs/Pleura: No suspicious appearing pulmonary nodules or masses are noted. No acute consolidative airspace disease. No pleural effusions.   Musculoskeletal/Soft Tissues: Median sternotomy wires. There are no aggressive appearing lytic or blastic lesions noted in the visualized portions of the skeleton.   CTA ABDOMEN AND PELVIS FINDINGS   Hepatobiliary: No suspicious cystic or solid hepatic lesions. No intra or extrahepatic biliary ductal dilatation. Gallbladder is normal in appearance.   Pancreas: No pancreatic mass. No pancreatic ductal dilatation. No pancreatic or peripancreatic fluid collections or inflammatory changes.   Spleen: Unremarkable.   Adrenals/Urinary Tract: 1.2 cm low-attenuation lesion in the medial aspect of the lower pole of the right kidney. Left kidney and bilateral adrenal glands are normal in appearance. No hydroureteronephrosis. Urinary bladder is nearly decompressed,  but otherwise unremarkable in appearance.   Stomach/Bowel: Normal appearance of the stomach. No pathologic dilatation of small bowel or colon. A few scattered colonic diverticulae are noted, without surrounding inflammatory changes to suggest an acute diverticulitis at this time. Normal appendix.   Vascular/Lymphatic: Aortic atherosclerosis, without evidence of aneurysm or dissection in the abdominal or pelvic vasculature. Vascular findings and measurements pertinent to potential TAVR procedure, as detailed below. No lymphadenopathy noted in the abdomen or pelvis.   Reproductive: Uterus and ovaries are atrophic.   Other: No significant volume of ascites.  No pneumoperitoneum.   Musculoskeletal: There are no aggressive appearing lytic or blastic lesions noted in the visualized portions of the skeleton.   VASCULAR MEASUREMENTS PERTINENT TO TAVR:   AORTA:   Minimal Aortic Diameter-15 x 12 mm   Severity of Aortic Calcification-moderate to severe   RIGHT PELVIS:   Right Common Iliac Artery -   Minimal Diameter-10.4 x 7.7 mm   Tortuosity-mild   Calcification - mild   Right External Iliac Artery -   Minimal Diameter-8.4 x 7.8 mm   Tortuosity-mild   Calcification-minimal   Right Common Femoral Artery -   Minimal Diameter-7.3 x 7.3 mm   Tortuosity-mild   Calcification-mild   LEFT PELVIS:   Left Common Iliac Artery -   Minimal Diameter-11.3 x 10.1 mm   Tortuosity-mild   Calcification-mild   Left External Iliac Artery -   Minimal Diameter-7.3 x 7.7 mm   Tortuosity-mild-to-moderate   Calcification-none   Left Common Femoral Artery -   Minimal Diameter-6.9 x 7.6 mm   Tortuosity-mild   Calcification-none   Review of the MIP images confirms the above findings.   IMPRESSION: 1. Vascular findings and measurements pertinent to potential TAVR procedure, as detailed above. 2. Severe thickening calcification of the aortic valve, compatible with  reported clinical history of severe aortic stenosis. 3. Aortic atherosclerosis, in addition to left main and 3 vessel coronary artery disease. Status post median sternotomy for CABG including LIMA. 4. Cardiomegaly. 5. Colonic diverticulosis without evidence of acute diverticulitis at this time. 6. Additional incidental findings, as above.     Electronically Signed   By: Vinnie Langton M.D.   On: 06/29/2021 14:58      STS RISK CALCULATOR: Procedure: AVR + CAB Risk of  Mortality: 5.628% Renal Failure: 2.815% Permanent Stroke: 3.693% Prolonged Ventilation: 17.540% DSW Infection: 0.358% Reoperation: 3.745% Morbidity or Mortality: 22.681% Short Length of Stay: 12.952% Long Length of Stay: 19.410%   Impression:   This 70 year old woman has stage D, severe, symptomatic aortic stenosis with New York Heart Association class II symptoms of exertional fatigue and shortness of breath consistent with chronic diastolic congestive heart failure.  She is status post single-vessel coronary bypass graft surgery in 2004.  I have personally reviewed her 2D echocardiogram, cardiac catheterization, and CTA studies.  Her echocardiogram shows a trileaflet aortic valve with severe calcification and restricted leaflet mobility.  The mean gradient is 38 mmHg consistent with severe aortic stenosis.  There is moderate aortic insufficiency with a pressure half-time of 295 ms.  Left ventricular systolic function is normal.  She has severe mitral annular calcification and some calcification of the posterior leaflet with a mean mitral valve gradient of 4 mmHg consistent with mild mitral stenosis.  There is also mild mitral regurgitation.  Cardiac catheterization shows a chronically occluded right coronary artery with a patent saphenous vein graft to the distal right coronary.  This vein graft has ostial to proximal 50 to 70% narrowing.  The mean gradient across aortic valve was measured at 36 mmHg with a  calculated valve area of 0.91 cm consistent with severe aortic stenosis.  There is moderate pulmonary hypertension at 45/21 with a mean wedge pressure of 28 and an LVEDP of 17.  I agree that aortic valve replacement is indicated in this patient for relief of her symptoms and to prevent progressive left ventricular deterioration.  Given her age, prior coronary bypass surgery, and comorbidities I think that transcatheter aortic valve replacement would be the best treatment for her.  Her gated cardiac CTA shows anatomy suitable for TAVR using either a 23 mm Edwards SAPIEN 3 valve or a 26 mm Medtronic Evolut Pro + valve.  There is a focal calcification of the sinotubular junction medially but I do not think that should cause any problem.  Her abdominal and pelvic CTA shows adequate pelvic vascular anatomy to allow transfemoral insertion.   The patient was counseled at length regarding treatment alternatives for management of severe symptomatic aortic stenosis. The risks and benefits of surgical intervention has been discussed in detail. Long-term prognosis with medical therapy was discussed. Alternative approaches such as conventional surgical aortic valve replacement, transcatheter aortic valve replacement, and palliative medical therapy were compared and contrasted at length. This discussion was placed in the context of the patient's own specific clinical presentation and past medical history. All of her questions have been addressed.    Following the decision to proceed with transcatheter aortic valve replacement, a discussion was held regarding what types of management strategies would be attempted intraoperatively in the event of life-threatening complications, including whether or not the patient would be considered a candidate for the use of cardiopulmonary bypass and/or conversion to open sternotomy for attempted surgical intervention.  I think she would be a candidate for emergent sternotomy to manage any  intraoperative complications although she would be at significantly increased risk due to prior coronary bypass surgery. The patient is aware of the fact that transient use of cardiopulmonary bypass may be necessary. The patient has been advised of a variety of complications that might develop including but not limited to risks of death, stroke, paravalvular leak, aortic dissection or other major vascular complications, aortic annulus rupture, device embolization, cardiac rupture or perforation, mitral regurgitation, acute myocardial  infarction, arrhythmia, heart block or bradycardia requiring permanent pacemaker placement, congestive heart failure, respiratory failure, renal failure, pneumonia, infection, other late complications related to structural valve deterioration or migration, or other complications that might ultimately cause a temporary or permanent loss of functional independence or other long term morbidity. The patient provides full informed consent for the procedure as described and all questions were answered.       Plan:   Transfemoral transcatheter aortic valve replacement.

## 2021-07-12 NOTE — Telephone Encounter (Signed)
Informed [place] 2 units will need to be on hold for TAVR. She was grateful for call and agrees with plan.

## 2021-07-13 ENCOUNTER — Inpatient Hospital Stay (HOSPITAL_COMMUNITY): Payer: 59 | Admitting: Certified Registered Nurse Anesthetist

## 2021-07-13 ENCOUNTER — Encounter (HOSPITAL_COMMUNITY)
Admission: RE | Disposition: A | Discharge status: Home / Self Care | Payer: 59 | Source: Home / Self Care | Attending: Cardiovascular Disease

## 2021-07-13 ENCOUNTER — Encounter (HOSPITAL_COMMUNITY): Payer: Self-pay | Admitting: Cardiovascular Disease

## 2021-07-13 ENCOUNTER — Inpatient Hospital Stay (HOSPITAL_COMMUNITY): Payer: 59

## 2021-07-13 ENCOUNTER — Other Ambulatory Visit: Payer: Self-pay

## 2021-07-13 ENCOUNTER — Inpatient Hospital Stay (HOSPITAL_COMMUNITY)
Admission: RE | Admit: 2021-07-13 | Discharge: 2021-07-14 | DRG: 267 | Disposition: A | Discharge status: Home / Self Care | Payer: 59 | Attending: Cardiovascular Disease | Admitting: Cardiovascular Disease

## 2021-07-13 ENCOUNTER — Encounter: Payer: Self-pay | Admitting: Physician Assistant

## 2021-07-13 ENCOUNTER — Inpatient Hospital Stay (HOSPITAL_COMMUNITY): Payer: 59 | Admitting: Vascular Surgery

## 2021-07-13 ENCOUNTER — Other Ambulatory Visit: Payer: Self-pay | Admitting: Physician Assistant

## 2021-07-13 DIAGNOSIS — M1990 Unspecified osteoarthritis, unspecified site: Secondary | ICD-10-CM | POA: Diagnosis present

## 2021-07-13 DIAGNOSIS — Z8673 Personal history of transient ischemic attack (TIA), and cerebral infarction without residual deficits: Secondary | ICD-10-CM | POA: Insufficient documentation

## 2021-07-13 DIAGNOSIS — I350 Nonrheumatic aortic (valve) stenosis: Secondary | ICD-10-CM | POA: Diagnosis not present

## 2021-07-13 DIAGNOSIS — Z91041 Radiographic dye allergy status: Secondary | ICD-10-CM

## 2021-07-13 DIAGNOSIS — I252 Old myocardial infarction: Secondary | ICD-10-CM

## 2021-07-13 DIAGNOSIS — I10 Essential (primary) hypertension: Secondary | ICD-10-CM | POA: Diagnosis present

## 2021-07-13 DIAGNOSIS — I2510 Atherosclerotic heart disease of native coronary artery without angina pectoris: Secondary | ICD-10-CM | POA: Diagnosis present

## 2021-07-13 DIAGNOSIS — Z006 Encounter for examination for normal comparison and control in clinical research program: Secondary | ICD-10-CM | POA: Diagnosis not present

## 2021-07-13 DIAGNOSIS — E7800 Pure hypercholesterolemia, unspecified: Secondary | ICD-10-CM | POA: Diagnosis present

## 2021-07-13 DIAGNOSIS — Z7951 Long term (current) use of inhaled steroids: Secondary | ICD-10-CM

## 2021-07-13 DIAGNOSIS — Z87891 Personal history of nicotine dependence: Secondary | ICD-10-CM

## 2021-07-13 DIAGNOSIS — E669 Obesity, unspecified: Secondary | ICD-10-CM | POA: Diagnosis present

## 2021-07-13 DIAGNOSIS — Z79899 Other long term (current) drug therapy: Secondary | ICD-10-CM

## 2021-07-13 DIAGNOSIS — J45909 Unspecified asthma, uncomplicated: Secondary | ICD-10-CM | POA: Diagnosis present

## 2021-07-13 DIAGNOSIS — M329 Systemic lupus erythematosus, unspecified: Secondary | ICD-10-CM | POA: Diagnosis not present

## 2021-07-13 DIAGNOSIS — Z952 Presence of prosthetic heart valve: Secondary | ICD-10-CM

## 2021-07-13 DIAGNOSIS — Z83438 Family history of other disorder of lipoprotein metabolism and other lipidemia: Secondary | ICD-10-CM

## 2021-07-13 DIAGNOSIS — Z79890 Hormone replacement therapy (postmenopausal): Secondary | ICD-10-CM

## 2021-07-13 DIAGNOSIS — I272 Pulmonary hypertension, unspecified: Secondary | ICD-10-CM | POA: Diagnosis present

## 2021-07-13 DIAGNOSIS — Z7982 Long term (current) use of aspirin: Secondary | ICD-10-CM

## 2021-07-13 DIAGNOSIS — D649 Anemia, unspecified: Secondary | ICD-10-CM | POA: Diagnosis present

## 2021-07-13 DIAGNOSIS — Z8701 Personal history of pneumonia (recurrent): Secondary | ICD-10-CM | POA: Diagnosis not present

## 2021-07-13 DIAGNOSIS — Z8249 Family history of ischemic heart disease and other diseases of the circulatory system: Secondary | ICD-10-CM

## 2021-07-13 DIAGNOSIS — I080 Rheumatic disorders of both mitral and aortic valves: Principal | ICD-10-CM | POA: Diagnosis present

## 2021-07-13 DIAGNOSIS — E119 Type 2 diabetes mellitus without complications: Secondary | ICD-10-CM

## 2021-07-13 DIAGNOSIS — Z7984 Long term (current) use of oral hypoglycemic drugs: Secondary | ICD-10-CM | POA: Diagnosis not present

## 2021-07-13 DIAGNOSIS — Z888 Allergy status to other drugs, medicaments and biological substances status: Secondary | ICD-10-CM

## 2021-07-13 DIAGNOSIS — I25810 Atherosclerosis of coronary artery bypass graft(s) without angina pectoris: Secondary | ICD-10-CM | POA: Diagnosis not present

## 2021-07-13 DIAGNOSIS — Z9851 Tubal ligation status: Secondary | ICD-10-CM

## 2021-07-13 DIAGNOSIS — K08409 Partial loss of teeth, unspecified cause, unspecified class: Secondary | ICD-10-CM | POA: Diagnosis present

## 2021-07-13 DIAGNOSIS — Z881 Allergy status to other antibiotic agents status: Secondary | ICD-10-CM

## 2021-07-13 DIAGNOSIS — Z6835 Body mass index (BMI) 35.0-35.9, adult: Secondary | ICD-10-CM

## 2021-07-13 DIAGNOSIS — Z880 Allergy status to penicillin: Secondary | ICD-10-CM

## 2021-07-13 HISTORY — DX: Presence of prosthetic heart valve: Z95.2

## 2021-07-13 HISTORY — PX: INTRAOPERATIVE TRANSTHORACIC ECHOCARDIOGRAM: SHX6523

## 2021-07-13 HISTORY — PX: TRANSCATHETER AORTIC VALVE REPLACEMENT, TRANSFEMORAL: SHX6400

## 2021-07-13 HISTORY — PX: ULTRASOUND GUIDANCE FOR VASCULAR ACCESS: SHX6516

## 2021-07-13 HISTORY — DX: Personal history of transient ischemic attack (TIA), and cerebral infarction without residual deficits: Z86.73

## 2021-07-13 LAB — POCT I-STAT, CHEM 8
BUN: 10 mg/dL (ref 8–23)
BUN: 10 mg/dL (ref 8–23)
Calcium, Ion: 1.36 mmol/L (ref 1.15–1.40)
Calcium, Ion: 1.34 mmol/L (ref 1.15–1.40)
Chloride: 107 mmol/L (ref 98–111)
Chloride: 107 mmol/L (ref 98–111)
Creatinine, Ser: 0.5 mg/dL (ref 0.44–1.00)
Creatinine, Ser: 0.4 mg/dL — ABNORMAL LOW (ref 0.44–1.00)
Glucose, Bld: 188 mg/dL — ABNORMAL HIGH (ref 70–99)
Glucose, Bld: 166 mg/dL — ABNORMAL HIGH (ref 70–99)
HCT: 33 % — ABNORMAL LOW (ref 36.0–46.0)
HCT: 35 % — ABNORMAL LOW (ref 36.0–46.0)
Hemoglobin: 11.2 g/dL — ABNORMAL LOW (ref 12.0–15.0)
Hemoglobin: 11.9 g/dL — ABNORMAL LOW (ref 12.0–15.0)
Potassium: 4.1 mmol/L (ref 3.5–5.1)
Potassium: 4.2 mmol/L (ref 3.5–5.1)
Sodium: 139 mmol/L (ref 135–145)
Sodium: 140 mmol/L (ref 135–145)
TCO2: 25 mmol/L (ref 22–32)
TCO2: 24 mmol/L (ref 22–32)

## 2021-07-13 LAB — GLUCOSE, CAPILLARY
Glucose-Capillary: 167 mg/dL — ABNORMAL HIGH (ref 70–99)
Glucose-Capillary: 134 mg/dL — ABNORMAL HIGH (ref 70–99)
Glucose-Capillary: 218 mg/dL — ABNORMAL HIGH (ref 70–99)
Glucose-Capillary: 93 mg/dL (ref 70–99)

## 2021-07-13 LAB — POCT I-STAT 7, (LYTES, BLD GAS, ICA,H+H)
Acid-base deficit: 3 mmol/L — ABNORMAL HIGH (ref 0.0–2.0)
Bicarbonate: 22.6 mmol/L (ref 20.0–28.0)
Calcium, Ion: 1.34 mmol/L (ref 1.15–1.40)
HCT: 34 % — ABNORMAL LOW (ref 36.0–46.0)
Hemoglobin: 11.6 g/dL — ABNORMAL LOW (ref 12.0–15.0)
O2 Saturation: 99 %
Potassium: 4 mmol/L (ref 3.5–5.1)
Sodium: 141 mmol/L (ref 135–145)
TCO2: 24 mmol/L (ref 22–32)
pCO2 arterial: 40.6 mmHg (ref 32.0–48.0)
pH, Arterial: 7.353 (ref 7.350–7.450)
pO2, Arterial: 157 mmHg — ABNORMAL HIGH (ref 83.0–108.0)

## 2021-07-13 LAB — ECHOCARDIOGRAM LIMITED
AR max vel: 1 cm2
AV Area VTI: 0.95 cm2
AV Area mean vel: 1.03 cm2
AV Mean grad: 32.3 mmHg
AV Peak grad: 56.1 mmHg
Ao pk vel: 3.75 m/s

## 2021-07-13 LAB — TYPE AND SCREEN
ABO/RH(D): A POS
Antibody Screen: POSITIVE

## 2021-07-13 SURGERY — IMPLANTATION, AORTIC VALVE, TRANSCATHETER, FEMORAL APPROACH
Anesthesia: Monitor Anesthesia Care | Site: Groin

## 2021-07-13 MED ORDER — HEPARIN 6000 UNIT IRRIGATION SOLUTION
Status: AC
  Filled 2021-07-13: qty 1500

## 2021-07-13 MED ORDER — HEPARIN SODIUM (PORCINE) 1000 UNIT/ML IJ SOLN
INTRAMUSCULAR | Status: AC
  Filled 2021-07-13: qty 1

## 2021-07-13 MED ORDER — MIDAZOLAM HCL 2 MG/2ML IJ SOLN
INTRAMUSCULAR | Status: AC
  Filled 2021-07-13: qty 2

## 2021-07-13 MED ORDER — ONDANSETRON HCL 4 MG/2ML IJ SOLN: 4.0000 mg | Freq: Four times a day (QID) | INTRAMUSCULAR | Status: DC | PRN

## 2021-07-13 MED ORDER — MIDAZOLAM HCL 5 MG/5ML IJ SOLN
INTRAMUSCULAR | Status: DC | PRN
  Administered 2021-07-13: 2 mg via INTRAVENOUS

## 2021-07-13 MED ORDER — ACETAMINOPHEN 650 MG RE SUPP: 650.0000 mg | Freq: Four times a day (QID) | RECTAL | Status: DC | PRN

## 2021-07-13 MED ORDER — INSULIN ASPART 100 UNIT/ML IJ SOLN
0.0000 [IU] | Freq: Three times a day (TID) | INTRAMUSCULAR | Status: DC
  Administered 2021-07-13: 8 [IU] via SUBCUTANEOUS

## 2021-07-13 MED ORDER — FENTANYL CITRATE (PF) 100 MCG/2ML IJ SOLN
INTRAMUSCULAR | Status: DC | PRN
  Administered 2021-07-13: 50 ug via INTRAVENOUS
  Administered 2021-07-13: 25 ug via INTRAVENOUS

## 2021-07-13 MED ORDER — PROPOFOL 500 MG/50ML IV EMUL
INTRAVENOUS | Status: DC | PRN
  Administered 2021-07-13: 10 ug/kg/min via INTRAVENOUS

## 2021-07-13 MED ORDER — LIDOCAINE HCL (PF) 1 % IJ SOLN
INTRAMUSCULAR | Status: AC
  Filled 2021-07-13: qty 30

## 2021-07-13 MED ORDER — FENTANYL CITRATE (PF) 250 MCG/5ML IJ SOLN
INTRAMUSCULAR | Status: AC
  Filled 2021-07-13: qty 5

## 2021-07-13 MED ORDER — LACTATED RINGERS IV SOLN: INTRAVENOUS | Status: DC

## 2021-07-13 MED ORDER — 0.9 % SODIUM CHLORIDE (POUR BTL) OPTIME
TOPICAL | Status: DC | PRN
  Administered 2021-07-13: 1000 mL

## 2021-07-13 MED ORDER — SODIUM CHLORIDE 0.9 % IV SOLN: INTRAVENOUS | Status: DC

## 2021-07-13 MED ORDER — PREDNISONE 20 MG PO TABS
50.0000 mg | ORAL_TABLET | Freq: Four times a day (QID) | ORAL | Status: DC
  Administered 2021-07-13: 50 mg via ORAL
  Filled 2021-07-13: qty 3

## 2021-07-13 MED ORDER — CHLORHEXIDINE GLUCONATE 4 % EX LIQD: 30.0000 mL | CUTANEOUS | Status: DC

## 2021-07-13 MED ORDER — SODIUM CHLORIDE 0.9% FLUSH: 3.0000 mL | INTRAVENOUS | Status: DC | PRN

## 2021-07-13 MED ORDER — HEPARIN 6000 UNIT IRRIGATION SOLUTION
Status: DC | PRN
  Administered 2021-07-13 (×3): 1

## 2021-07-13 MED ORDER — TRAMADOL HCL 50 MG PO TABS: 50.0000 mg | ORAL_TABLET | ORAL | Status: DC | PRN

## 2021-07-13 MED ORDER — SODIUM CHLORIDE 0.9% FLUSH
3.0000 mL | Freq: Two times a day (BID) | INTRAVENOUS | Status: DC
  Administered 2021-07-13 – 2021-07-14 (×2): 3 mL via INTRAVENOUS

## 2021-07-13 MED ORDER — DIPHENHYDRAMINE HCL 25 MG PO CAPS
50.0000 mg | ORAL_CAPSULE | Freq: Once | ORAL | Status: AC
  Administered 2021-07-13: 50 mg via ORAL
  Filled 2021-07-13: qty 2

## 2021-07-13 MED ORDER — ESMOLOL HCL 100 MG/10ML IV SOLN
INTRAVENOUS | Status: AC
  Filled 2021-07-13: qty 10

## 2021-07-13 MED ORDER — VANCOMYCIN HCL IN DEXTROSE 1-5 GM/200ML-% IV SOLN
1000.0000 mg | Freq: Once | INTRAVENOUS | Status: AC
  Administered 2021-07-13: 1000 mg via INTRAVENOUS
  Filled 2021-07-13: qty 200

## 2021-07-13 MED ORDER — DIPHENHYDRAMINE HCL 50 MG/ML IJ SOLN: 50.0000 mg | Freq: Once | INTRAMUSCULAR | Status: AC

## 2021-07-13 MED ORDER — EZETIMIBE 10 MG PO TABS
10.0000 mg | ORAL_TABLET | Freq: Every day | ORAL | Status: DC
  Administered 2021-07-13 – 2021-07-14 (×2): 10 mg via ORAL
  Filled 2021-07-13 (×4): qty 1

## 2021-07-13 MED ORDER — SODIUM CHLORIDE 0.9 % IV SOLN: INTRAVENOUS | Status: AC

## 2021-07-13 MED ORDER — LIDOCAINE HCL 1 % IJ SOLN
INTRAMUSCULAR | Status: DC | PRN
  Administered 2021-07-13: 10 mL

## 2021-07-13 MED ORDER — CHLORHEXIDINE GLUCONATE 0.12 % MT SOLN
15.0000 mL | Freq: Once | OROMUCOSAL | Status: AC
  Administered 2021-07-13: 15 mL via OROMUCOSAL
  Filled 2021-07-13: qty 15

## 2021-07-13 MED ORDER — ROSUVASTATIN CALCIUM 20 MG PO TABS
40.0000 mg | ORAL_TABLET | Freq: Every day | ORAL | Status: DC
  Administered 2021-07-13: 40 mg via ORAL
  Filled 2021-07-13: qty 2

## 2021-07-13 MED ORDER — CLOPIDOGREL BISULFATE 75 MG PO TABS
75.0000 mg | ORAL_TABLET | Freq: Every day | ORAL | Status: DC
Start: 2021-07-14 — End: 2021-07-14
  Administered 2021-07-14: 75 mg via ORAL
  Filled 2021-07-13: qty 1

## 2021-07-13 MED ORDER — ONDANSETRON HCL 4 MG/2ML IJ SOLN
INTRAMUSCULAR | Status: AC
  Filled 2021-07-13: qty 2

## 2021-07-13 MED ORDER — PROPOFOL 10 MG/ML IV BOLUS
INTRAVENOUS | Status: AC
  Filled 2021-07-13: qty 20

## 2021-07-13 MED ORDER — ALPRAZOLAM 0.25 MG PO TABS
0.2500 mg | ORAL_TABLET | Freq: Two times a day (BID) | ORAL | Status: DC | PRN
  Administered 2021-07-14: 0.25 mg via ORAL
  Filled 2021-07-13: qty 1

## 2021-07-13 MED ORDER — ONDANSETRON HCL 4 MG/2ML IJ SOLN
INTRAMUSCULAR | Status: DC | PRN
  Administered 2021-07-13: 4 mg via INTRAVENOUS

## 2021-07-13 MED ORDER — HEPARIN SODIUM (PORCINE) 1000 UNIT/ML IJ SOLN
INTRAMUSCULAR | Status: DC | PRN
  Administered 2021-07-13: 15000 [IU] via INTRAVENOUS

## 2021-07-13 MED ORDER — FAMOTIDINE 20 MG PO TABS
40.0000 mg | ORAL_TABLET | Freq: Every day | ORAL | Status: DC
  Administered 2021-07-14: 40 mg via ORAL
  Filled 2021-07-13: qty 2

## 2021-07-13 MED ORDER — LACTATED RINGERS IV SOLN: INTRAVENOUS | Status: DC | PRN

## 2021-07-13 MED ORDER — NITROGLYCERIN IN D5W 200-5 MCG/ML-% IV SOLN: 0.0000 ug/min | INTRAVENOUS | Status: DC

## 2021-07-13 MED ORDER — WHITE PETROLATUM EX OINT
TOPICAL_OINTMENT | CUTANEOUS | Status: AC
  Filled 2021-07-13: qty 28.35

## 2021-07-13 MED ORDER — ESMOLOL HCL 100 MG/10ML IV SOLN
INTRAVENOUS | Status: DC | PRN
  Administered 2021-07-13: 10 mg via INTRAVENOUS

## 2021-07-13 MED ORDER — MORPHINE SULFATE (PF) 2 MG/ML IV SOLN: 1.0000 mg | INTRAVENOUS | Status: DC | PRN

## 2021-07-13 MED ORDER — CHLORHEXIDINE GLUCONATE 4 % EX LIQD: 60.0000 mL | Freq: Once | CUTANEOUS | Status: DC

## 2021-07-13 MED ORDER — SODIUM CHLORIDE 0.9 % IV SOLN: 250.0000 mL | INTRAVENOUS | Status: DC

## 2021-07-13 MED ORDER — SODIUM CHLORIDE 0.9 % IV SOLN: 250.0000 mL | INTRAVENOUS | Status: DC | PRN

## 2021-07-13 MED ORDER — ASPIRIN EC 81 MG PO TBEC
81.0000 mg | DELAYED_RELEASE_TABLET | Freq: Every day | ORAL | Status: DC
  Administered 2021-07-14: 81 mg via ORAL
  Filled 2021-07-13: qty 1

## 2021-07-13 MED ORDER — CHLORHEXIDINE GLUCONATE 0.12 % MT SOLN: 15.0000 mL | Freq: Once | OROMUCOSAL | Status: DC

## 2021-07-13 MED ORDER — PROTAMINE SULFATE 10 MG/ML IV SOLN
INTRAVENOUS | Status: DC | PRN
  Administered 2021-07-13: 150 mg via INTRAVENOUS

## 2021-07-13 MED ORDER — LORATADINE 10 MG PO TABS
10.0000 mg | ORAL_TABLET | Freq: Every day | ORAL | Status: DC
  Administered 2021-07-14: 10 mg via ORAL
  Filled 2021-07-13: qty 1

## 2021-07-13 MED ORDER — MOMETASONE FURO-FORMOTEROL FUM 200-5 MCG/ACT IN AERO
2.0000 | INHALATION_SPRAY | Freq: Two times a day (BID) | RESPIRATORY_TRACT | Status: DC
  Filled 2021-07-13: qty 8.8

## 2021-07-13 MED ORDER — DAPAGLIFLOZIN PROPANEDIOL 10 MG PO TABS
10.0000 mg | ORAL_TABLET | Freq: Every day | ORAL | Status: DC
  Administered 2021-07-14: 10 mg via ORAL
  Filled 2021-07-13: qty 1

## 2021-07-13 MED ORDER — FERROUS SULFATE 325 (65 FE) MG PO TABS
325.0000 mg | ORAL_TABLET | ORAL | Status: DC
  Administered 2021-07-14: 325 mg via ORAL
  Filled 2021-07-13: qty 1

## 2021-07-13 MED ORDER — IODIXANOL 320 MG/ML IV SOLN
INTRAVENOUS | Status: DC | PRN
  Administered 2021-07-13: 33 mL via INTRA_ARTERIAL

## 2021-07-13 MED ORDER — OXYCODONE HCL 5 MG PO TABS: 5.0000 mg | ORAL_TABLET | ORAL | Status: DC | PRN

## 2021-07-13 MED ORDER — PHENYLEPHRINE HCL-NACL 20-0.9 MG/250ML-% IV SOLN: 0.0000 ug/min | INTRAVENOUS | Status: DC

## 2021-07-13 MED ORDER — ACETAMINOPHEN 325 MG PO TABS
650.0000 mg | ORAL_TABLET | Freq: Four times a day (QID) | ORAL | Status: DC | PRN
  Administered 2021-07-13: 650 mg via ORAL
  Filled 2021-07-13: qty 2

## 2021-07-13 SURGICAL SUPPLY — 47 items
ADH SKN CLS APL DERMABOND .7 (GAUZE/BANDAGES/DRESSINGS) ×4
APL PRP STRL LF DISP 70% ISPRP (MISCELLANEOUS) ×4
BAG DECANTER FOR FLEXI CONT (MISCELLANEOUS) ×2 IMPLANT
BAG SNAP BAND KOVER 36X36 (MISCELLANEOUS) ×5 IMPLANT
CABLE ADAPT CONN TEMP 6FT (ADAPTER) ×5 IMPLANT
CATH DIAG EXPO 6F AL1 (CATHETERS) ×2 IMPLANT
CATH DIAG EXPO 6F VENT PIG 145 (CATHETERS) ×10 IMPLANT
CATH S G BIP PACING (CATHETERS) ×5 IMPLANT
CHLORAPREP W/TINT 26 (MISCELLANEOUS) ×5 IMPLANT
CLOSURE MYNX CONTROL 6F/7F (Vascular Products) IMPLANT
CNTNR URN SCR LID CUP LEK RST (MISCELLANEOUS) ×8 IMPLANT
CONT SPEC 4OZ STRL OR WHT (MISCELLANEOUS) ×10
COVER BACK TABLE 80X110 HD (DRAPES) ×2 IMPLANT
DERMABOND ADVANCED (GAUZE/BANDAGES/DRESSINGS) ×1
DERMABOND ADVANCED .7 DNX12 (GAUZE/BANDAGES/DRESSINGS) ×4 IMPLANT
DEVICE CLOSURE PERCLS PRGLD 6F (VASCULAR PRODUCTS) ×8 IMPLANT
DRSG TEGADERM 4X4.75 (GAUZE/BANDAGES/DRESSINGS) ×10 IMPLANT
ELECT REM PT RETURN 9FT ADLT (ELECTROSURGICAL) ×5
ELECTRODE REM PT RTRN 9FT ADLT (ELECTROSURGICAL) ×7 IMPLANT
GAUZE SPONGE 4X4 12PLY STRL (GAUZE/BANDAGES/DRESSINGS) ×5 IMPLANT
GUIDEWIRE SAF TJ AMPL .035X180 (WIRE) ×5 IMPLANT
GUIDEWIRE SAFE TJ AMPLATZ EXST (WIRE) ×5 IMPLANT
KIT BASIN OR (CUSTOM PROCEDURE TRAY) ×5 IMPLANT
KIT HEART LEFT (KITS) ×5 IMPLANT
KIT TURNOVER KIT B (KITS) ×5 IMPLANT
NS IRRIG 1000ML POUR BTL (IV SOLUTION) ×5 IMPLANT
PACK ENDO MINOR (CUSTOM PROCEDURE TRAY) ×5 IMPLANT
PAD ARMBOARD 7.5X6 YLW CONV (MISCELLANEOUS) ×10 IMPLANT
PAD ELECT DEFIB RADIOL ZOLL (MISCELLANEOUS) ×5 IMPLANT
PENCIL BUTTON HOLSTER BLD 10FT (ELECTRODE) IMPLANT
PERCLOSE PROGLIDE 6F (VASCULAR PRODUCTS) ×10
POSITIONER HEAD DONUT 9IN (MISCELLANEOUS) ×5 IMPLANT
SET MICROPUNCTURE 5F STIFF (MISCELLANEOUS) ×5 IMPLANT
SHEATH BRITE TIP 7FR 35CM (SHEATH) ×5 IMPLANT
SHEATH PINNACLE 6F 10CM (SHEATH) ×5 IMPLANT
SHEATH PINNACLE 8F 10CM (SHEATH) ×5 IMPLANT
SLEEVE REPOSITIONING LENGTH 30 (MISCELLANEOUS) ×5 IMPLANT
STOPCOCK MORSE 400PSI 3WAY (MISCELLANEOUS) ×10 IMPLANT
SUT SILK  1 MH (SUTURE) ×5
SUT SILK 1 MH (SUTURE) ×4 IMPLANT
SYR 50ML LL SCALE MARK (SYRINGE) ×5 IMPLANT
SYR MEDRAD MARK V 150ML (SYRINGE) ×5 IMPLANT
TOWEL GREEN STERILE (TOWEL DISPOSABLE) ×10 IMPLANT
TRANSDUCER W/STOPCOCK (MISCELLANEOUS) ×10 IMPLANT
VALVE 23 ULTRA SAPIEN KIT (Valve) ×2 IMPLANT
WIRE EMERALD 3MM-J .035X150CM (WIRE) ×5 IMPLANT
WIRE EMERALD 3MM-J .035X260CM (WIRE) ×5 IMPLANT

## 2021-07-13 NOTE — Progress Notes (Signed)
Mobility Specialist Progress Note    07/13/21 1429  Mobility  Activity Ambulated in hall  Level of Assistance Independent  Assistive Device None  Distance Ambulated (ft) 510 ft  Mobility Ambulated independently in hallway  Mobility Response Tolerated well  Mobility performed by Mobility specialist  Bed Position Chair  $Mobility charge 1 Mobility   Pre-Mobility: 69 HR, 111/61 BP, 100% SpO2 During Mobility: 100% SpO2 Post-Mobility: 72 HR, 120/58 BP  Pt agreeable to ambulate after finishing her lunch. Asx throughout and returned to chair with call bell in reach.   Hildred Alamin Mobility Specialist  Mobility Specialist Phone: [phone]

## 2021-07-13 NOTE — Progress Notes (Signed)
Pt took medications this morning, including her beta blocker (metoprolol succinate 25 mg) @ 0500. She did however, hold her metformin since 07/11/21. Dr. Cyndia Bent, Dr. Smith Robert, and Trixie Deis, CRNA notified. Paged Dr. Burt Knack to make aware.

## 2021-07-13 NOTE — Progress Notes (Signed)
  [facility] VALVE TEAM  Patient doing well s/p TAVR. She is hemodynamically stable. Groin sites stable. ECG with sinus and new ILBBB but no high grade block. Plan to pull arterial line and transfer to 4E. Plan for early ambulation after bedrest completed and hopeful discharge over the next 24-48 hours.   Angelena Form PA-C  [facility]  Pager [phone]

## 2021-07-13 NOTE — Progress Notes (Signed)
  Echocardiogram 2D Echocardiogram has been performed.  Deanna Simmons 07/13/2021, 9:04 AM

## 2021-07-13 NOTE — Progress Notes (Signed)
        20 G, R radial arterial line was pulled, and manual pressure was held for 10 min. No hematoma. Sterile gauze was applied at the site. Capillary refill of , 3 sec. R radial pulse 2+

## 2021-07-13 NOTE — Op Note (Signed)
[facility]   MULTIDISCIPLINARY HEART VALVE TEAM   TAVR OPERATIVE NOTE   Date of Procedure:  07/13/2021  Preoperative Diagnosis: Severe Aortic Stenosis   Postoperative Diagnosis: Same   Procedure:   Transcatheter Aortic Valve Replacement - Percutaneous  Transfemoral Approach  Edwards Sapien 3 Ultra THV (size 23 mm, model # 9750TFX, serial # [id number])   Co-Surgeons:  Gaye Pollack, MD and Sherren Mocha, MD  Anesthesiologist:  Suella Broad, MD  Echocardiographer:  Sanda Klein, MD  Pre-operative Echo Findings: Severe aortic stenosis and moderate AI Normal left ventricular systolic function  Post-operative Echo Findings: No paravalvular leak Normal/unchanged left ventricular systolic function  BRIEF CLINICAL NOTE AND INDICATIONS FOR SURGERY  70 year old woman with hypertension, type 2 diabetes, and coronary artery disease with history of single-vessel CABG in 2004, presents for TAVR today.  The patient has undergone multidisciplinary evaluation with echo and cardiac catheterization studies confirming severe aortic stenosis.  She has New York Heart Association functional class II symptoms.  After review of her CTA studies and multidisciplinary discussion, she presents today for TAVR with plans for implantation of a 23 mm Edwards SAPIEN 3 valve via transfemoral approach.  During the course of the patient's preoperative work up they have been evaluated comprehensively by a multidisciplinary team of specialists coordinated through the [facility].  They have been demonstrated to suffer from symptomatic severe aortic stenosis as noted above. The patient has been counseled extensively as to the relative risks and benefits of all options for the treatment of severe aortic stenosis including long term medical therapy, conventional surgery for aortic valve replacement, and transcatheter aortic valve  replacement.  The patient has been independently evaluated in formal cardiac surgical consultation by Dr Cyndia Bent, who deemed the patient appropriate for TAVR. Based upon review of all of the patient's preoperative diagnostic tests they are felt to be candidate for transcatheter aortic valve replacement using the transfemoral approach as an alternative to conventional surgery.    Following the decision to proceed with transcatheter aortic valve replacement, a discussion has been held regarding what types of management strategies would be attempted intraoperatively in the event of life-threatening complications, including whether or not the patient would be considered a candidate for the use of cardiopulmonary bypass and/or conversion to open sternotomy for attempted surgical intervention.  The patient has been advised of a variety of complications that might develop peculiar to this approach including but not limited to risks of death, stroke, paravalvular leak, aortic dissection or other major vascular complications, aortic annulus rupture, device embolization, cardiac rupture or perforation, acute myocardial infarction, arrhythmia, heart block or bradycardia requiring permanent pacemaker placement, congestive heart failure, respiratory failure, renal failure, pneumonia, infection, other late complications related to structural valve deterioration or migration, or other complications that might ultimately cause a temporary or permanent loss of functional independence or other long term morbidity.  The patient provides full informed consent for the procedure as described and all questions were answered preoperatively.  DETAILS OF THE OPERATIVE PROCEDURE  PREPARATION:   The patient is brought to the operating room on the above mentioned date and central monitoring was established by the anesthesia team including placement of a radial arterial line. The patient is placed in the supine position on the operating  table.  Intravenous antibiotics are administered. The patient is monitored closely throughout the procedure under conscious sedation.  Baseline transthoracic echocardiogram is performed. The patient's chest, abdomen, both  groins, and both lower extremities are prepared and draped in a sterile manner. A time out procedure is performed.   PERIPHERAL ACCESS:   Using ultrasound guidance, femoral arterial and venous access is obtained with placement of 6 Fr sheaths on the left side.  [place] images are digitally captured and stored in the patient's chart. A pigtail diagnostic catheter was passed through the femoral arterial sheath under fluoroscopic guidance into the aortic root.  A temporary transvenous pacemaker catheter was passed through the femoral venous sheath under fluoroscopic guidance into the right ventricle.  The pacemaker was tested to ensure stable lead placement and pacemaker capture. Aortic root angiography was performed in order to determine the optimal angiographic angle for valve deployment.  TRANSFEMORAL ACCESS:  A micropuncture technique is used to access the right femoral artery under fluoroscopic and ultrasound guidance.  2 Perclose devices are deployed at 10' and 2' positions to 'PreClose' the femoral artery. An 8 French sheath is placed and then an Amplatz Superstiff wire is advanced through the sheath. This is changed out for a 14 French transfemoral E-Sheath after progressively dilating over the Superstiff wire.  An AL-1 catheter was used to direct a straight-tip exchange length wire across the native aortic valve into the left ventricle. This was exchanged out for a pigtail catheter and position was confirmed in the LV apex. Simultaneous LV and Ao pressures were recorded.  The pigtail catheter was exchanged for an Amplatz Extra-stiff wire in the LV apex.    BALLOON AORTIC VALVULOPLASTY:  Not performed  TRANSCATHETER HEART VALVE DEPLOYMENT:  An Edwards Sapien 3 transcatheter heart  valve (size 23 mm) was prepared and crimped per manufacturer's guidelines, and the proper orientation of the valve is confirmed on the [employer] delivery system. The valve was advanced through the introducer sheath using normal technique until in an appropriate position in the abdominal aorta beyond the sheath tip. The balloon was then retracted and using the fine-tuning wheel was centered on the valve. The valve was then advanced across the aortic arch using appropriate flexion of the catheter. The valve was carefully positioned across the aortic valve annulus. The Commander catheter was retracted using normal technique. Once final position of the valve has been confirmed by angiographic assessment, the valve is deployed while temporarily holding ventilation and during rapid ventricular pacing to maintain systolic blood pressure < 50 mmHg and pulse pressure < 10 mmHg. The balloon inflation is held for >3 seconds after reaching full deployment volume. Once the balloon has fully deflated the balloon is retracted into the ascending aorta and valve function is assessed using echocardiography. The patient's hemodynamic recovery following valve deployment is good.  The deployment balloon and guidewire are both removed. Echo demostrated acceptable post-procedural gradients, stable mitral valve function, and no aortic insufficiency.    PROCEDURE COMPLETION:  The sheath was removed and femoral artery closure is performed using the 2 previously deployed Perclose devices.  Protamine is administered once femoral arterial repair was complete. The site is clear with no evidence of bleeding or hematoma after the sutures are tightened. The temporary pacemaker and pigtail catheters are removed. Mynx closure is used for contralateral femoral arterial hemostasis for the 6 Fr sheath, but the device would not deploy properly.  The Mynx device is removed from the body without being deployed.  Manual pressure was used for  hemostasis.  The patient tolerated the procedure well and is transported to the recovery area in stable condition. There were no immediate intraoperative complications. All  sponge instrument and needle counts are verified correct at completion of the operation.   The patient received a total of 40 mL of intravenous contrast during the procedure.   Sherren Mocha, MD 07/13/2021 11:33 AM

## 2021-07-13 NOTE — Op Note (Signed)
[facility]   MULTIDISCIPLINARY HEART VALVE TEAM   TAVR OPERATIVE NOTE   Date of Procedure:  07/13/2021  Preoperative Diagnosis: Severe Aortic Stenosis   Postoperative Diagnosis: Same   Procedure:   Transcatheter Aortic Valve Replacement - Percutaneous Right Transfemoral Approach  Edwards Sapien 3 Ultra THV (size 23 mm, model # 9750TFX, serial # [id number])   Co-Surgeons:  Gaye Pollack, MD and Sherren Mocha, MD    Anesthesiologist:  Suella Broad, MD  Echocardiographer:  Sanda Klein, MD  Pre-operative Echo Findings: Severe aortic stenosis Normal left ventricular systolic function  Post-operative Echo Findings: No paravalvular leak Normal left ventricular systolic function   BRIEF CLINICAL NOTE AND INDICATIONS FOR SURGERY  This 70 year old woman has stage D, severe, symptomatic aortic stenosis with New York Heart Association class II symptoms of exertional fatigue and shortness of breath consistent with chronic diastolic congestive heart failure.  She is status post single-vessel coronary bypass graft surgery in 2004.  I have personally reviewed her 2D echocardiogram, cardiac catheterization, and CTA studies.  Her echocardiogram shows a trileaflet aortic valve with severe calcification and restricted leaflet mobility.  The mean gradient is 38 mmHg consistent with severe aortic stenosis.  There is moderate aortic insufficiency with a pressure half-time of 295 ms.  Left ventricular systolic function is normal.  She has severe mitral annular calcification and some calcification of the posterior leaflet with a mean mitral valve gradient of 4 mmHg consistent with mild mitral stenosis.  There is also mild mitral regurgitation.  Cardiac catheterization shows a chronically occluded right coronary artery with a patent saphenous vein graft to the distal right coronary.  This vein graft has ostial to proximal 50 to 70% narrowing.  The mean gradient across aortic valve was  measured at 36 mmHg with a calculated valve area of 0.91 cm consistent with severe aortic stenosis.  There is moderate pulmonary hypertension at 45/21 with a mean wedge pressure of 28 and an LVEDP of 17.  I agree that aortic valve replacement is indicated in this patient for relief of her symptoms and to prevent progressive left ventricular deterioration.  Given her age, prior coronary bypass surgery, and comorbidities I think that transcatheter aortic valve replacement would be the best treatment for her.  Her gated cardiac CTA shows anatomy suitable for TAVR using either a 23 mm Edwards SAPIEN 3 valve or a 26 mm Medtronic Evolut Pro + valve.  There is a focal calcification of the sinotubular junction medially but I do not think that should cause any problem.  Her abdominal and pelvic CTA shows adequate pelvic vascular anatomy to allow transfemoral insertion.   The patient was counseled at length regarding treatment alternatives for management of severe symptomatic aortic stenosis. The risks and benefits of surgical intervention has been discussed in detail. Long-term prognosis with medical therapy was discussed. Alternative approaches such as conventional surgical aortic valve replacement, transcatheter aortic valve replacement, and palliative medical therapy were compared and contrasted at length. This discussion was placed in the context of the patient's own specific clinical presentation and past medical history. All of her questions have been addressed.    Following the decision to proceed with transcatheter aortic valve replacement, a discussion was held regarding what types of management strategies would be attempted intraoperatively in the event of life-threatening complications, including whether or not the patient would be considered a candidate for the use of cardiopulmonary bypass and/or conversion to open sternotomy for attempted surgical intervention.  I think she  would be a candidate for  emergent sternotomy to manage any intraoperative complications although she would be at significantly increased risk due to prior coronary bypass surgery. The patient is aware of the fact that transient use of cardiopulmonary bypass may be necessary. The patient has been advised of a variety of complications that might develop including but not limited to risks of death, stroke, paravalvular leak, aortic dissection or other major vascular complications, aortic annulus rupture, device embolization, cardiac rupture or perforation, mitral regurgitation, acute myocardial infarction, arrhythmia, heart block or bradycardia requiring permanent pacemaker placement, congestive heart failure, respiratory failure, renal failure, pneumonia, infection, other late complications related to structural valve deterioration or migration, or other complications that might ultimately cause a temporary or permanent loss of functional independence or other long term morbidity. The patient provides full informed consent for the procedure as described and all questions were answered.     DETAILS OF THE OPERATIVE PROCEDURE  PREPARATION:    The patient was brought to the operating room on the above mentioned date and appropriate monitoring was established by the anesthesia team. The patient was placed in the supine position on the operating table.  Intravenous antibiotics were administered. The patient was monitored closely throughout the procedure under conscious sedation. Baseline transthoracic echocardiogram was performed. The patient's abdomen and both groins were prepped and draped in a sterile manner. A time out procedure was performed.   PERIPHERAL ACCESS:    Using the modified Seldinger technique, femoral arterial and venous access was obtained with placement of 6 Fr sheaths on the left side.  A pigtail diagnostic catheter was passed through the left arterial sheath under fluoroscopic guidance into the aortic root.  A  temporary transvenous pacemaker catheter was passed through the left femoral venous sheath under fluoroscopic guidance into the right ventricle.  The pacemaker was tested to ensure stable lead placement and pacemaker capture. Aortic root angiography was performed in order to determine the optimal angiographic angle for valve deployment.   TRANSFEMORAL ACCESS:   Percutaneous transfemoral access and sheath placement was performed using ultrasound guidance.  The right common femoral artery was cannulated using a micropuncture needle and appropriate location was verified using hand injection angiogram.  A pair of [employer] Perclose percutaneous closure devices were placed and a 6 French sheath replaced into the femoral artery.  The patient was heparinized systemically and ACT verified > 250 seconds.    A 14 Fr transfemoral E-sheath was introduced into the right common femoral artery after progressively dilating over an Amplatz superstiff wire. An AL-1 catheter was used to direct a straight-tip exchange length wire across the native aortic valve into the left ventricle. This was exchanged out for a pigtail catheter and position was confirmed in the LV apex. Simultaneous LV and Ao pressures were recorded.  The pigtail catheter was exchanged for an Amplatz Extra-stiff wire in the LV apex.     BALLOON AORTIC VALVULOPLASTY:   Not performed  TRANSCATHETER HEART VALVE DEPLOYMENT:   An Edwards Sapien 3 Ultra transcatheter heart valve (size 23 mm) was prepared and crimped per manufacturer's guidelines, and the proper orientation of the valve is confirmed on the [employer] delivery system. The valve was advanced through the introducer sheath using normal technique until in an appropriate position in the abdominal aorta beyond the sheath tip. The balloon was then retracted and using the fine-tuning wheel was centered on the valve. The valve was then advanced across the aortic arch using appropriate flexion of  the catheter.  The valve was carefully positioned across the aortic valve annulus. The Commander catheter was retracted using normal technique. Once final position of the valve has been confirmed by angiographic assessment, the valve is deployed while temporarily holding ventilation and during rapid ventricular pacing to maintain systolic blood pressure < 50 mmHg and pulse pressure < 10 mmHg. The balloon inflation is held for >3 seconds after reaching full deployment volume. Once the balloon has fully deflated the balloon is retracted into the ascending aorta and valve function is assessed using echocardiography. There is felt to be no paravalvular leak and no central aortic insufficiency.  The patient's hemodynamic recovery following valve deployment is good.  The deployment balloon and guidewire are both removed.    PROCEDURE COMPLETION:   The sheath was removed and femoral artery closure performed.  Protamine was administered once femoral arterial repair was complete. The temporary pacemaker, pigtail catheters and femoral sheaths were removed with manual pressure used for hemostasis.  A Mynx femoral closure device was utilized following removal of the diagnostic sheath in the left femoral artery.  The patient tolerated the procedure well and is transported to the cath lab recovery area in stable condition. There were no immediate intraoperative complications. All sponge instrument and needle counts are verified correct at completion of the operation.   No blood products were administered during the operation.  The patient received a total of 40 mL of intravenous contrast during the procedure.   Gaye Pollack, MD 07/13/2021

## 2021-07-13 NOTE — Anesthesia Postprocedure Evaluation (Signed)
Anesthesia Post Note  Patient: [name]  Procedure(s) Performed: TRANSCATHETER AORTIC VALVE REPLACEMENT, TRANSFEMORAL (Chest) ULTRASOUND GUIDANCE FOR VASCULAR ACCESS (Bilateral: Groin) INTRAOPERATIVE TRANSTHORACIC ECHOCARDIOGRAM (Left: Chest)     Patient location during evaluation: PACU Anesthesia Type: MAC Level of consciousness: awake and alert Pain management: pain level controlled Vital Signs Assessment: post-procedure vital signs reviewed and stable Respiratory status: spontaneous breathing, nonlabored ventilation, respiratory function stable and patient connected to nasal cannula oxygen Cardiovascular status: stable and blood pressure returned to baseline Postop Assessment: no apparent nausea or vomiting Anesthetic complications: no   No notable events documented.  Last Vitals:  Vitals:   07/13/21 1200 07/13/21 1215  BP: (!) 107/59 (!) 112/50  Pulse: 60 63  Resp: 16 15  Temp:    SpO2: 100% 99%    Last Pain:  Vitals:   07/13/21 1136  TempSrc: Oral  PainSc: 0-No pain                 Effie Berkshire

## 2021-07-13 NOTE — Progress Notes (Signed)
Patient arrived to 4E01 from cath lab. Bilateral groins level 0, gauze and transparent dressings c/d/I. VSS. Patient denies pain. Patient placed on telemetry and CCMD notified. Patient educated about 4 hour bedrest. Bed low and locked, bed alarm engaged, and call bell and room phone left within reach.

## 2021-07-13 NOTE — Anesthesia Procedure Notes (Signed)
Procedure Name: MAC Date/Time: 07/13/2021 7:30 AM Performed by: Leonor Liv, CRNA Pre-anesthesia Checklist: Patient identified, Emergency Drugs available, Suction available, Patient being monitored and Timeout performed Patient Re-evaluated:Patient Re-evaluated prior to induction Oxygen Delivery Method: Simple face mask Placement Confirmation: positive ETCO2 Dental Injury: Teeth and Oropharynx as per pre-operative assessment

## 2021-07-13 NOTE — Consult Note (Signed)
   [facility] CM Inpatient Consult   07/13/2021  Deanna Simmons 1951/01/28 [id number]   [name] [facility] [ACO] Patient: [name] plan  Patient is currently assigned to a [place] Coordinator for the [place].   1630: Spoke with inpatient TOC RN that [facility] is following for post hospital support.  Attempts to reach patient by phone not successful.    Plan: Patient will be followed by [place] Coordinator.   For additional questions or referrals please contact:   Natividad Brood, RN BSN [facility] Liaison  [phone] business mobile phone Toll free office [phone]  Fax number: [phone] [name]

## 2021-07-13 NOTE — Discharge Instructions (Signed)
ACTIVITY AND EXERCISE °• Daily activity and exercise are an important part of your recovery. People recover at different rates depending on their general health and type of valve procedure. °• Most people recovering from TAVR feel better relatively quickly  °• No lifting, pushing, pulling more than 10 pounds (examples to avoid: groceries, vacuuming, gardening, golfing): °            - For one week with a procedure through the groin. °            - For six weeks for procedures through the chest wall or neck. °NOTE: You will typically see one of our providers 7-14 days after your procedure to discuss WHEN TO RESUME the above activities.  °  °  °DRIVING °• Do not drive until you are seen for follow up and cleared by a provider. Generally, we ask patient to not drive for 1 week after their procedure. °• If you have been told by your doctor in the past that you may not drive, you must talk with him/her before you begin driving again. °  °DRESSING °• Groin site: you may leave the clear dressing over the site for up to one week or until it falls off. °  °HYGIENE °• If you had a femoral (leg) procedure, you may take a shower when you return home. After the shower, pat the site dry. Do NOT use powder, oils or lotions in your groin area until the site has completely healed. °• If you had a chest procedure, you may shower when you return home unless specifically instructed not to by your discharging practitioner. °            - DO NOT scrub incision; pat dry with a towel. °            - DO NOT apply any lotions, oils, powders to the incision. °            - No tub baths / swimming for at least 2 weeks. °• If you notice any fevers, chills, increased pain, swelling, bleeding or pus, please contact your doctor. °  °ADDITIONAL INFORMATION °• If you are going to have an upcoming dental procedure, please contact our office as you will require antibiotics ahead of time to prevent infection on your heart valve.  ° ° °If you have any  questions or concerns you can call the structural heart phone during normal business hours 8am-4pm. If you have an urgent need after hours or weekends please call [phone] to talk to the on call provider for general cardiology. If you have an emergency that requires immediate attention, please call 911.  ° ° °After TAVR Checklist ° °Check  Test Description  ° Follow up appointment in 1-2 weeks  You will see our structural heart physician assistant, Katie Shaquana Buel. Your incision sites will be checked and you will be cleared to drive and resume all normal activities if you are doing well.    ° 1 month echo and follow up  You will have an echo to check on your new heart valve and be seen back in the office by Katie Geneve Kimpel. Many times the echo is not read by your appointment time, but Katie will call you later that day or the following day to report your results.  ° Follow up with your primary cardiologist You will need to be seen by your primary cardiologist in the following 3-6 months after your 1 month appointment in the valve   clinic. Often times your Plavix or Aspirin will be discontinued during this time, but this is decided on a case by case basis.   ° 1 year echo and follow up You will have another echo to check on your heart valve after 1 year and be seen back in the office by Katie Jodeci Roarty. This your last structural heart visit.  ° Bacterial endocarditis prophylaxis  You will have to take antibiotics for the rest of your life before all dental procedures (even teeth cleanings) to protect your heart valve. Antibiotics are also required before some surgeries. Please check with your cardiologist before scheduling any surgeries. Also, please make sure to tell us if you have a penicillin allergy as you will require an alternative antibiotic.   ° ° °

## 2021-07-13 NOTE — Transfer of Care (Signed)
Immediate Anesthesia Transfer of Care Note  Patient: Deanna Simmons  Procedure(s) Performed: TRANSCATHETER AORTIC VALVE REPLACEMENT, TRANSFEMORAL (Chest) ULTRASOUND GUIDANCE FOR VASCULAR ACCESS (Bilateral: Groin) INTRAOPERATIVE TRANSTHORACIC ECHOCARDIOGRAM (Left: Chest)  Patient Location: PACU  Anesthesia Type:MAC  Level of Consciousness: drowsy and patient cooperative  Airway & Oxygen Therapy: Patient Spontanous Breathing and Patient connected to nasal cannula oxygen  Post-op Assessment: Report given to RN, Post -op Vital signs reviewed and stable and Patient moving all extremities  Post vital signs: Reviewed and stable  Last Vitals:  Vitals Value Taken Time  BP 136/56 07/13/21 0939  Temp    Pulse 50 07/13/21 0939  Resp 16 07/13/21 0939  SpO2 97 % 07/13/21 0939  Vitals shown include unvalidated device data.  Last Pain:  Vitals:   07/13/21 0619  TempSrc:   PainSc: 0-No pain      Patients Stated Pain Goal: 4 (90/24/09 7353)  Complications: No notable events documented.

## 2021-07-13 NOTE — Interval H&P Note (Signed)
History and Physical Interval Note:  07/13/2021 6:10 AM  Deanna Simmons  has presented today for surgery, with the diagnosis of Severe Aortic Stenosis.  The various methods of treatment have been discussed with the patient and family. After consideration of risks, benefits and other options for treatment, the patient has consented to  Procedure(s): TRANSCATHETER AORTIC VALVE REPLACEMENT, TRANSFEMORAL (N/A) TRANSESOPHAGEAL ECHOCARDIOGRAM (TEE) (N/A) as a surgical intervention.  The patient's history has been reviewed, patient examined, no change in status, stable for surgery.  I have reviewed the patient's chart and labs.  Questions were answered to the patient's satisfaction.     Gaye Pollack

## 2021-07-13 NOTE — Anesthesia Preprocedure Evaluation (Addendum)
Anesthesia Evaluation  Patient identified by MRN, date of birth, ID band Patient awake    Reviewed: Allergy & Precautions, NPO status , Patient's Chart, lab work & pertinent test results  Airway Mallampati: I  TM Distance: >3 FB Neck ROM: Full    Dental  (+) Edentulous Upper, Partial Lower, Dental Advisory Given   Pulmonary asthma , former smoker,    breath sounds clear to auscultation       Cardiovascular hypertension, Pt. on home beta blockers + CAD, + Past MI and + CABG   Rhythm:Regular Rate:Normal + Systolic murmurs Echo: 1. Severe aortic stenosis is present. V max 4.06 m/s, MG 38 mmHG. AVA  over estimated (1.1 cm2) as LVOT PW signal obtained too close to the valve  (LVOT VTI 39 cm). Based on Vmax and mean gradient, aortic stenosis is  severe. There is mild to moderate  aortic regurgitation, but this is not well visualized. The aortic valve is  tricuspid. There is severe calcifcation of the aortic valve. There is  severe thickening of the aortic valve. Aortic valve regurgitation is mild  to moderate. Severe aortic valve  stenosis.  2. Left ventricular ejection fraction, by estimation, is 55 to 60%. Left  ventricular ejection fraction by 3D volume is 57 %. The left ventricle has  normal function. The left ventricle has no regional wall motion  abnormalities. Left ventricular diastolic  function could not be evaluated. The average left ventricular global  longitudinal strain is -22.2 %. The global longitudinal strain is normal.  3. Right ventricular systolic function is normal. The right ventricular  size is normal. There is normal pulmonary artery systolic pressure. The  estimated right ventricular systolic pressure is 17.4 mmHg.  4. Left atrial size was mild to moderately dilated.  5. Mild calcific mitral stenosis. MVA by VTI 2.04 cm2. MG 4 mmHG @ 64  bpm. The mitral valve is degenerative. Mild mitral valve  regurgitation.  Mild mitral stenosis. The mean mitral valve gradient is 4.0 mmHg. Moderate  to severe mitral annular  calcification.  6. The inferior vena cava is normal in size with greater than 50%  respiratory variability, suggesting right atrial pressure of 3 mmHg.    Neuro/Psych CVA negative psych ROS   GI/Hepatic Neg liver ROS, GERD  Medicated,  Endo/Other  diabetes, Type 2, Oral Hypoglycemic Agents  Renal/GU negative Renal ROS     Musculoskeletal   Abdominal Normal abdominal exam  (+)   Peds  Hematology   Anesthesia Other Findings   Reproductive/Obstetrics                            Anesthesia Physical Anesthesia Plan  ASA: 4  Anesthesia Plan: MAC   Post-op Pain Management:    Induction: Intravenous  PONV Risk Score and Plan: 0 and Propofol infusion, Ondansetron and Midazolam  Airway Management Planned: Natural Airway and Simple Face Mask  Additional Equipment: Arterial line  Intra-op Plan:   Post-operative Plan: Extubation in OR  Informed Consent: I have reviewed the patients History and Physical, chart, labs and discussed the procedure including the risks, benefits and alternatives for the proposed anesthesia with the patient or authorized representative who has indicated his/her understanding and acceptance.       Plan Discussed with: CRNA  Anesthesia Plan Comments:        Anesthesia Quick Evaluation

## 2021-07-13 NOTE — Anesthesia Procedure Notes (Addendum)
Arterial Line Insertion Start/End9/13/2022 7:05 AM, 07/13/2021 7:22 AM Performed by: Leonor Liv, CRNA, CRNA  Preanesthetic checklist: patient identified, IV checked, site marked, risks and benefits discussed, surgical consent, monitors and equipment checked, pre-op evaluation, timeout performed and anesthesia consent Lidocaine 1% used for infiltration Right, radial was placed Catheter size: 20 G Hand hygiene performed  and maximum sterile barriers used  Allen's test indicative of satisfactory collateral circulation Attempts: 1 Procedure performed without using ultrasound guided technique. Following insertion, dressing applied and Biopatch. Patient tolerated the procedure well with no immediate complications.

## 2021-07-14 ENCOUNTER — Other Ambulatory Visit: Payer: Self-pay

## 2021-07-14 ENCOUNTER — Other Ambulatory Visit (HOSPITAL_COMMUNITY): Payer: Self-pay

## 2021-07-14 ENCOUNTER — Inpatient Hospital Stay (HOSPITAL_COMMUNITY): Payer: 59

## 2021-07-14 ENCOUNTER — Encounter (HOSPITAL_COMMUNITY): Payer: Self-pay | Admitting: Cardiovascular Disease

## 2021-07-14 DIAGNOSIS — Z952 Presence of prosthetic heart valve: Secondary | ICD-10-CM

## 2021-07-14 DIAGNOSIS — I350 Nonrheumatic aortic (valve) stenosis: Secondary | ICD-10-CM

## 2021-07-14 LAB — ECHOCARDIOGRAM COMPLETE
AR max vel: 1.62 cm2
AV Area VTI: 1.45 cm2
AV Area mean vel: 1.53 cm2
AV Mean grad: 20 mmHg
AV Peak grad: 33.6 mmHg
Ao pk vel: 2.9 m/s
Area-P 1/2: 2.11 cm2
Height: 65 in
MV VTI: 2.4 cm2
S' Lateral: 2.5 cm
Weight: 3389.79 oz

## 2021-07-14 LAB — CBC
HCT: 33.5 % — ABNORMAL LOW (ref 36.0–46.0)
Hemoglobin: 10.4 g/dL — ABNORMAL LOW (ref 12.0–15.0)
MCH: 27.4 pg (ref 26.0–34.0)
MCHC: 31 g/dL (ref 30.0–36.0)
MCV: 88.4 fL (ref 80.0–100.0)
Platelets: 216 10*3/uL (ref 150–400)
RBC: 3.79 MIL/uL — ABNORMAL LOW (ref 3.87–5.11)
RDW: 13.2 % (ref 11.5–15.5)
WBC: 12.1 10*3/uL — ABNORMAL HIGH (ref 4.0–10.5)
nRBC: 0 % (ref 0.0–0.2)

## 2021-07-14 LAB — BASIC METABOLIC PANEL
Anion gap: 8 (ref 5–15)
BUN: 12 mg/dL (ref 8–23)
CO2: 26 mmol/L (ref 22–32)
Calcium: 9.1 mg/dL (ref 8.9–10.3)
Chloride: 106 mmol/L (ref 98–111)
Creatinine, Ser: 0.66 mg/dL (ref 0.44–1.00)
GFR, Estimated: >60 mL/min (ref >60)
Glucose, Bld: 125 mg/dL — ABNORMAL HIGH (ref 70–99)
Potassium: 3.9 mmol/L (ref 3.5–5.1)
Sodium: 140 mmol/L (ref 135–145)

## 2021-07-14 LAB — GLUCOSE, CAPILLARY: Glucose-Capillary: 120 mg/dL — ABNORMAL HIGH (ref 70–99)

## 2021-07-14 MED ORDER — CLOPIDOGREL BISULFATE 75 MG PO TABS
75.0000 mg | ORAL_TABLET | Freq: Every day | ORAL | 1 refills | Status: DC
  Filled 2021-07-14 (×2): qty 90, 90d supply, fill #0
  Filled 2021-10-08: qty 90, 90d supply, fill #1

## 2021-07-14 MED ORDER — NITROGLYCERIN 0.4 MG SL SUBL
0.4000 mg | SUBLINGUAL_TABLET | SUBLINGUAL | 3 refills | Status: DC | PRN
  Filled 2021-07-14: qty 25, 30d supply, fill #0
  Filled 2022-01-11: qty 25, 30d supply, fill #1

## 2021-07-14 MED ORDER — CLOPIDOGREL BISULFATE 75 MG PO TABS
75.0000 mg | ORAL_TABLET | Freq: Every day | ORAL | 1 refills | Status: DC
  Filled 2021-07-14: qty 90, 90d supply, fill #0

## 2021-07-14 NOTE — Progress Notes (Signed)
Mobility Specialist Progress Note    07/14/21 1140  Mobility  Activity Ambulated in hall  Level of Assistance Independent  Assistive Device None  Distance Ambulated (ft) 510 ft  Mobility Ambulated independently in hallway  Mobility Response Tolerated well  Mobility performed by Mobility specialist  Bed Position Chair  $Mobility charge 1 Mobility   Pre-Mobility: 72 HR, 124/67 BP During Mobility: 156 HR Post-Mobility: 81 HR, 142/81 BP  Pt found in chair and agreeable to ambulation. Felt asx throughout and returned to chair with call bell in reach and visitor present.   Hildred Alamin Mobility Specialist  Mobility Specialist Phone: [phone]

## 2021-07-14 NOTE — Progress Notes (Signed)
Patient given discharge instructions and stated understanding.  Patient cant leave until we find out where her prescription is.

## 2021-07-14 NOTE — Discharge Summary (Addendum)
[place] VALVE TEAM  Discharge Summary    Patient ID: Deanna Simmons MRN: [id number]; DOB: Aug 07, 1951  Admit date: 07/13/2021 Discharge date: 07/14/2021  Primary Care Provider: Josetta Huddle, MD  Primary Cardiologist: Sinclair Grooms, MD / Dr. Burt Knack & Dr. Cyndia Bent (TAVR)  Discharge Diagnoses    Principal Problem:   S/P TAVR (transcatheter aortic valve replacement) Active Problems:   CAD (coronary artery disease) of artery bypass graft   Hypercholesteremia   Obesity   Systemic lupus erythematosus ([place])   Hypertension   Severe aortic stenosis   Type 2 diabetes mellitus (HCC)   History of CVA (cerebrovascular accident)   Allergies Allergies  Allergen Reactions   Biaxin [Clarithromycin] Itching   Cardiolite [Technetium-96m Itching   Ciprofloxacin Itching   Erythromycin Base Itching   Montelukast Sodium Itching   Niaspan [Niacin Er] Other (See Comments)    flushing   Penicillins Itching    Reaction: over 10 years   Prilosec [Omeprazole] Itching   Singulair [Montelukast] Hives and Itching   Ticlid [Ticlopidine Hcl] Itching   Iodinated Diagnostic Agents Itching    Pt has a potential contrast allergy; She states in 2012 her cardiologist told her she had a reaction during the "study". We are unable to determine the study or confirm a reaction. The patient will have a 13 hr prep for future studies. Molecular Imaging states their injection does not cause any reactions.    Diagnostic Studies/Procedures    TAVR OPERATIVE NOTE     Date of Procedure:                07/13/2021   Preoperative Diagnosis:      Severe Aortic Stenosis    Postoperative Diagnosis:    Same    Procedure:        Transcatheter Aortic Valve Replacement - Percutaneous Right Transfemoral Approach             Edwards Sapien 3 Ultra THV (size 23 mm, model # 9750TFX, serial # [id number]              Co-Surgeons:                        BGaye Pollack MD and  MSherren Mocha MD      Anesthesiologist:                  KSuella Broad MD   Echocardiographer:              MSanda Klein MD   Pre-operative Echo Findings: Severe aortic stenosis Normal left ventricular systolic function   Post-operative Echo Findings: No paravalvular leak Normal left ventricular systolic function _____________    Echo 07/14/21: completed but pending formal read at the time of discharge   History of Present Illness     Deanna GIBLERis a 70y.o. female with a history of CAD s/p remote CABG x1V in 2004 by Dr. BCyndia Bentwith a SVG to PDA, HTN, asthma, type 2 diabetes, SLE, prior stroke with right retinal artery occlusion and more recent TIA in June 2022 and severeaorticstenosis with a mean gradient of 38 mmHg [facility]   She has a history of aortic stenosis followed by Dr. STamala Julian  She recently developed exertional shortness of breath and fatigue, especially when climbing up steps.  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  Repeat echocardiogram on 05/05/21 showed EF 55 to 60%, severe aortic stenosis with a mean gradient of 38 mmHg, and mild to moderate aortic regurgitation as well as mild mitral stenosis/mitral regurgitation. L/RHC on 06/11/21 showed native vessel CAD with a total occlusion of her RCA, patent LAD, patent circumflex, patent 70 year old SVG to distal RCA with an ostial proximal 50 to 70% narrowing.  Medical therapy was recommended.  The patient has been evaluated by the multidisciplinary valve team and felt to have severe, symptomatic aortic stenosis and to be a suitable candidate for TAVR, which was set up for 07/13/21. Of note, she was treated with prednisone and benadryl pre operatively for contrast dye allergy.  Hospital Course     Consultants: none   Severe AS: s/p successful TAVR with a 23 mm Edwards Sapien 3 Ultra THV  via the TF approach on 07/13/21. Post operative echo completed but pending formal read. Groin sites are stable. ECG/tele with sinus with PACs and no high grade heart block. Continue Asprin and started on Plaivx 15m daily. Plan for discharge home today with close follow up in the office next week.    CAD s/p CABG: pre TAVR L/RHC on 06/11/21 showed native vessel CAD with a total occlusion of her RCA, patent LAD, patent circumflex, patent 70year old SVG to distal RCA with an ostial proximal 50 to 70% narrowing. Continue medical therapy.   DMT2: treated with SSI while admitted. Resume home meds at discharge. Okay to resume Metformin after 48 hours after contrast dye exposure (9/15PM)   Hx of CVA: continue aspirin and statin.  _____________  Discharge Vitals Blood pressure (!) 111/54, pulse 61, temperature 98 F (36.7 C), temperature source Oral, resp. rate 19, height 5' 5"  (1.651 m), weight 96.1 kg, SpO2 98 %.  Filed Weights   07/13/21 0554 07/14/21 0516  Weight: 95.3 kg 96.1 kg    GEN: Well nourished, well developed, in no acute distress HEENT: normal Neck: no JVD or masses Cardiac: RRR; soft flow murmur. No rubs, or gallops,no edema  Respiratory:  clear to auscultation bilaterally, normal work of breathing GI: soft, nontender, nondistended, + BS MS: no deformity or atrophy Skin: warm and dry, no rash.  Groin sites clear without hematoma or ecchymosis  Neuro:  Alert and Oriented x 3, Strength and sensation are intact Psych: euthymic mood, full affect  Labs & Radiologic Studies    CBC Recent Labs    07/13/21 0952 07/14/21 0227  WBC  --  12.1*  HGB 11.9* 10.4*  HCT 35.0* 33.5*  MCV  --  88.4  PLT  --  2833  Basic Metabolic Panel Recent Labs    07/13/21 0952 07/14/21 0227  NA 140 140  K 4.2 3.9  CL 107 106  CO2  --  26  GLUCOSE 166* 125*  BUN 10 12  CREATININE 0.40* 0.66  CALCIUM  --  9.1   Liver Function Tests No results for input(s): AST, ALT, ALKPHOS, BILITOT,  PROT, ALBUMIN in the last 72 hours. No results for input(s): LIPASE, AMYLASE in the last 72 hours. Cardiac Enzymes No results for input(s): CKTOTAL, CKMB, CKMBINDEX, TROPONINI in the last 72 hours. BNP Invalid input(s): POCBNP D-Dimer No results for input(s): DDIMER in the last 72 hours. Hemoglobin A1C No results for input(s): HGBA1C in the last 72 hours. Fasting Lipid Panel No results for input(s): CHOL, HDL, LDLCALC, TRIG, CHOLHDL, LDLDIRECT in the  last 72 hours. Thyroid Function Tests No results for input(s): TSH, T4TOTAL, T3FREE, THYROIDAB in the last 72 hours.  Invalid input(s): FREET3 _____________  DG Chest 2 View  Result Date: 07/09/2021 CLINICAL DATA:  Preoperative, TAVR EXAM: CHEST - 2 VIEW COMPARISON:  Three hundred twenty-five 1,021 FINDINGS: Heart size is normal. Status post median sternotomy. Both lungs are clear. Disc degenerative disease of the thoracic spine. IMPRESSION: No acute abnormality of the lungs. Electronically Signed   By: Eddie Candle M.D.   On: 07/09/2021 16:38   CT CORONARY MORPH W/CTA COR W/SCORE W/CA W/CM &/OR WO/CM  Addendum Date: 06/30/2021   ADDENDUM REPORT: 06/30/2021 19:29 CLINICAL DATA:  Severe Aortic Stenosis. EXAM: Cardiac TAVR CT TECHNIQUE: A non-contrast, gated CT scan was obtained with axial slices of 3 mm through the heart for aortic valve calcium scoring. A 120 kV retrospective, gated, contrast cardiac scan was obtained. Gantry rotation speed was 250 msecs and collimation was 0.6 mm. Nitroglycerin was not given. The 3D data set was reconstructed in 5% intervals of the 0-95% of the R-R cycle. Systolic and diastolic phases were analyzed on a dedicated workstation using MPR, MIP, and VRT modes. The patient received 100 cc of contrast. FINDINGS: Image quality: Average.  PVC noted during the exam. Noise artifact is: Limited. Valve Morphology: The aortic valve is tricuspid with severe calcifications and restricted movement in systole. The calcifications  appear diffuse. Aortic Valve Calcium score: 1696 Aortic annular dimension: Phase assessed: 15% Annular area: 409 mm2 Annular perimeter: 72.6 mm Max diameter: 24.7 mm Min diameter: 21.3 mm Annular and subannular calcification: None. Optimal coplanar projection: LAO 3 CRA 22 Coronary Artery Height above Annulus: Left Main: 17.7 mm Right Coronary: 16.1 mm Sinus of Valsalva Measurements: Non-coronary: 27.5 mm Right-coronary: 24.9 mm Left-coronary: 28.1 mm Average diameter: 27.0 mm Sinus of Valsalva Height: Non-coronary: 19.3 mm Right-coronary: 24.0 mm Left-coronary: 24.5 mm Sinotubular Junction: 28 mm with a single severe protruding calcification noted. Ascending Thoracic Aorta: 35 mm. Coronary Arteries: Normal coronary origin. Right dominance. The study was performed without use of NTG and is insufficient for plaque evaluation. Please refer to recent cardiac catheterization for coronary assessment. 3-vessel coronary calcifications noted. SVG-RCA noted. Cardiac Morphology: Right Atrium: Right atrial size is within normal limits. Right Ventricle: The right ventricular cavity is within normal limits. Left Atrium: Left atrial size is normal in size with no left atrial appendage filling defect. Left Ventricle: The ventricular cavity size is within normal limits. There are no stigmata of prior infarction. There is no abnormal filling defect. Pulmonary arteries: Normal in size without proximal filling defect. Pulmonary veins: Normal pulmonary venous drainage. Pericardium: Normal thickness with no significant effusion or calcium present. Mitral Valve: The mitral valve is degenerative with moderate to severe annular calcium. Extra-cardiac findings: See attached radiology report for non-cardiac structures. IMPRESSION: 1. Tricuspid aortic valve with diffuse severe calcifications. Severe aortic stenosis with calcium score 1696. 2. Difficult study with PVC noted during systole but annular measurements support a 23 mm Edwards Sapien  3 (409 mm2). 3. Severe protruding calcification noted at the STJ which should be considered for self expanding valve. 4. No significant annular or subannular calcifications. 5. Sufficient coronary to annulus distance. 6. Optimal Fluoroscopic Angle for Delivery: LAO 3 CRA 11 [place] T. Audie Box, MD Electronically Signed   By: Eleonore Chiquito M.D.   On: 06/30/2021 19:29   Result Date: 06/30/2021 EXAM: OVER-READ INTERPRETATION  CT CHEST The following report is an over-read performed by radiologist Dr. Vinnie Langton  of [facility], [place] on 06/29/2021. This over-read does not include interpretation of cardiac or coronary anatomy or pathology. The coronary calcium score/coronary CTA interpretation by the cardiologist is attached. COMPARISON:  None. FINDINGS: Extracardiac findings will be described separately under dictation for contemporaneously obtained CTA chest, abdomen and pelvis. IMPRESSION: Please see separate dictation for contemporaneously obtained CTA chest, abdomen and pelvis dated 06/29/2021 for full description of relevant extracardiac findings. Electronically Signed: By: Vinnie Langton M.D. On: 06/29/2021 11:47   CT ANGIO CHEST AORTA W/CM & OR WO/CM  Result Date: 06/29/2021 CLINICAL DATA:  70 year old female with history of severe aortic stenosis. Preprocedural study prior to potential transcatheter aortic valve replacement (TAVR) procedure. EXAM: CT ANGIOGRAPHY CHEST, ABDOMEN AND PELVIS TECHNIQUE: Multidetector CT imaging through the chest, abdomen and pelvis was performed using the standard protocol during bolus administration of intravenous contrast. Multiplanar reconstructed images and MIPs were obtained and reviewed to evaluate the vascular anatomy. CONTRAST:  139m OMNIPAQUE IOHEXOL 350 MG/ML SOLN COMPARISON:  No priors. FINDINGS: CTA CHEST FINDINGS Cardiovascular: Heart size is enlarged. There is no significant pericardial fluid, thickening or pericardial calcification. There is aortic  atherosclerosis, as well as atherosclerosis of the great vessels of the mediastinum and the coronary arteries, including calcified atherosclerotic plaque in the left main, left anterior descending, left circumflex and right coronary arteries. Severe thickening calcification of the aortic valve. Severe calcifications of the mitral annulus. Status post median sternotomy for CABG. Mediastinum/Lymph Nodes: No pathologically enlarged mediastinal or hilar lymph nodes. Esophagus is unremarkable in appearance. No axillary lymphadenopathy. Lungs/Pleura: No suspicious appearing pulmonary nodules or masses are noted. No acute consolidative airspace disease. No pleural effusions. Musculoskeletal/Soft Tissues: Median sternotomy wires. There are no aggressive appearing lytic or blastic lesions noted in the visualized portions of the skeleton. CTA ABDOMEN AND PELVIS FINDINGS Hepatobiliary: No suspicious cystic or solid hepatic lesions. No intra or extrahepatic biliary ductal dilatation. Gallbladder is normal in appearance. Pancreas: No pancreatic mass. No pancreatic ductal dilatation. No pancreatic or peripancreatic fluid collections or inflammatory changes. Spleen: Unremarkable. Adrenals/Urinary Tract: 1.2 cm low-attenuation lesion in the medial aspect of the lower pole of the right kidney. Left kidney and bilateral adrenal glands are normal in appearance. No hydroureteronephrosis. Urinary bladder is nearly decompressed, but otherwise unremarkable in appearance. Stomach/Bowel: Normal appearance of the stomach. No pathologic dilatation of small bowel or colon. A few scattered colonic diverticulae are noted, without surrounding inflammatory changes to suggest an acute diverticulitis at this time. Normal appendix. Vascular/Lymphatic: Aortic atherosclerosis, without evidence of aneurysm or dissection in the abdominal or pelvic vasculature. Vascular findings and measurements pertinent to potential TAVR procedure, as detailed below. No  lymphadenopathy noted in the abdomen or pelvis. Reproductive: Uterus and ovaries are atrophic. Other: No significant volume of ascites.  No pneumoperitoneum. Musculoskeletal: There are no aggressive appearing lytic or blastic lesions noted in the visualized portions of the skeleton. VASCULAR MEASUREMENTS PERTINENT TO TAVR: AORTA: Minimal Aortic Diameter-15 x 12 mm Severity of Aortic Calcification-moderate to severe RIGHT PELVIS: Right Common Iliac Artery - Minimal Diameter-10.4 x 7.7 mm Tortuosity-mild Calcification - mild Right External Iliac Artery - Minimal Diameter-8.4 x 7.8 mm Tortuosity-mild Calcification-minimal Right Common Femoral Artery - Minimal Diameter-7.3 x 7.3 mm Tortuosity-mild Calcification-mild LEFT PELVIS: Left Common Iliac Artery - Minimal Diameter-11.3 x 10.1 mm Tortuosity-mild Calcification-mild Left External Iliac Artery - Minimal Diameter-7.3 x 7.7 mm Tortuosity-mild-to-moderate Calcification-none Left Common Femoral Artery - Minimal Diameter-6.9 x 7.6 mm Tortuosity-mild Calcification-none Review of the MIP images confirms the above findings. IMPRESSION:  1. Vascular findings and measurements pertinent to potential TAVR procedure, as detailed above. 2. Severe thickening calcification of the aortic valve, compatible with reported clinical history of severe aortic stenosis. 3. Aortic atherosclerosis, in addition to left main and 3 vessel coronary artery disease. Status post median sternotomy for CABG including LIMA. 4. Cardiomegaly. 5. Colonic diverticulosis without evidence of acute diverticulitis at this time. 6. Additional incidental findings, as above. Electronically Signed   By: Vinnie Langton M.D.   On: 06/29/2021 14:58   VAS US CAROTID  Result Date: 06/29/2021 Carotid Arterial Duplex Study Patient Name:  RUTHER EPHRAIM  Date of Exam:   06/29/2021 Medical Rec #: [id number]        Accession #:    [id number] Date of Birth: April 13, 1951        Patient Gender: F Patient Age:   19 years Exam  Location:  [facility] Procedure:      VAS US CAROTID Referring Phys: Sherren Mocha --------------------------------------------------------------------------------  Indications:       Pre-TAVR. Risk Factors:      Hypertension, hyperlipidemia, Diabetes, past history of                    smoking, prior MI, coronary artery disease. Comparison Study:  No prior studies. Performing Technologist: Darlin Coco RDMS, RVT  Examination Guidelines: A complete evaluation includes B-mode imaging, spectral Doppler, color Doppler, and power Doppler as needed of all accessible portions of each vessel. Bilateral testing is considered an integral part of a complete examination. Limited examinations for reoccurring indications may be performed as noted.  Right Carotid Findings: +----------+--------+--------+--------+----------------------------+--------+           PSV cm/sEDV cm/sStenosisPlaque Description          Comments +----------+--------+--------+--------+----------------------------+--------+ CCA Prox  73      18                                                   +----------+--------+--------+--------+----------------------------+--------+ CCA Distal73      21                                                   +----------+--------+--------+--------+----------------------------+--------+ ICA Prox  55      19      1-39%   hyperechoic and heterogenous         +----------+--------+--------+--------+----------------------------+--------+ ICA Distal68      26                                                   +----------+--------+--------+--------+----------------------------+--------+ ECA       70      10                                                   +----------+--------+--------+--------+----------------------------+--------+ +----------+--------+-------+----------------+-------------------+           PSV cm/sEDV cmsDescribe        Arm  Pressure (mmHG)  +----------+--------+-------+----------------+-------------------+ [id number]             Multiphasic, WNL                    +----------+--------+-------+----------------+-------------------+ +---------+--------+--+--------+-+---------+ VertebralPSV cm/s28EDV cm/s8Antegrade +---------+--------+--+--------+-+---------+  Left Carotid Findings: +----------+--------+--------+--------+------------------+--------+           PSV cm/sEDV cm/sStenosisPlaque DescriptionComments +----------+--------+--------+--------+------------------+--------+ CCA Prox  74      14                                         +----------+--------+--------+--------+------------------+--------+ CCA Distal81      27                                         +----------+--------+--------+--------+------------------+--------+ ICA Prox  74      22      1-39%   heterogenous               +----------+--------+--------+--------+------------------+--------+ ICA Mid   153     68                                tortuous +----------+--------+--------+--------+------------------+--------+ ICA Distal65      25                                         +----------+--------+--------+--------+------------------+--------+ ECA       70      13                                         +----------+--------+--------+--------+------------------+--------+ +----------+--------+--------+----------------+-------------------+           PSV cm/sEDV cm/sDescribe        Arm Pressure (mmHG) +----------+--------+--------+----------------+-------------------+ [id number]             Multiphasic, WNL                    +----------+--------+--------+----------------+-------------------+ +---------+--------+--+--------+--+---------+ VertebralPSV cm/s48EDV cm/s18Antegrade +---------+--------+--+--------+--+---------+   Summary: Right Carotid: Velocities in the right ICA are consistent with a 1-39% stenosis.  Left Carotid: Velocities in the left ICA are consistent with a 1-39% stenosis. Vertebrals:  Bilateral vertebral arteries demonstrate antegrade flow. Subclavians: Normal flow hemodynamics were seen in bilateral subclavian              arteries. *See table(s) above for measurements and observations.  Electronically signed by Deitra Mayo MD on 06/29/2021 at 10:42:51 AM.    Final    ECHOCARDIOGRAM LIMITED  Result Date: 07/13/2021    ECHOCARDIOGRAM LIMITED REPORT   Patient Name:   LAZARIAH SAVARD Date of Exam: 07/13/2021 Medical Rec #:  [id number]       Height:       65.0 in Accession #:    [id number]      Weight:       210.0 lb Date of Birth:  02-26-1951       BSA:          2.020 m Patient Age:    28 years        BP:  112/59 mmHg Patient Gender: F               HR:           76 bpm. Exam Location:  Inpatient Procedure: Limited Echo, Cardiac Doppler and Color Doppler Indications:      Aortic stenosis  History:          Patient has prior history of Echocardiogram examinations, most                   recent 05/05/2021. CAD and Previous Myocardial Infarction, Prior                   CABG, Stroke, Aortic Valve Disease and severe aortic stenosis,                   moderate MR; Risk Factors:Hypertension, Diabetes, Dyslipidemia                   and Obesity.  Sonographer:      Dustin Flock RDCS Referring Phys:   [id number] Woodfin Ganja THOMPSON Diagnosing Phys:  Sanda Klein MD                    PREOPERATIVE FINDINGS:                    Normal left ventricular systolic function and wall motion, EF                   65%.                   Trileaflet aortic valve with severe calcific stenosis.                   Aortic valve area 0.95 cm (0.47 cm2/m2 indexed for BSA), mean                   gradient 32.3 mmHg, Vmax 3.75 m/s, dimanesionless index 0.29,                   acceleration time 127 ms.                   Mild aortic insufficiency.                   Moderate mitral insufficiency.                   No  pericardial effusion.                    POSTOPERATIVE FINDINGS:                    Hyperdynamic left ventricular systolic function and normal                   wall motion, EF 70%.                   Well seated 23 mm Edwards S3U TAVR stent valve without                   perivalvular leak.                   Aortic valve area 2.68 cm (1.33 cm2/m2 BSA), mean gradient 8                   mmHg, Vmax 1.97 m/s, dimensionless index 0.81, acceleration  time 56 ms.                   No aortic insufficiency.                   Mild-moderate mitral insufficiency.                   No pericardial effusion.  IMPRESSIONS  1. Left ventricular ejection fraction, by estimation, is 60 to 65%. The left ventricle has normal function. There is mild concentric left ventricular hypertrophy.  2. Right ventricular systolic function is normal. The right ventricular size is normal.  3. The mitral valve is degenerative. Moderate mitral valve regurgitation. Severe mitral annular calcification.  4. Tricuspid valve regurgitation is mild to moderate.  5. The aortic valve is tricuspid. There is severe calcifcation of the aortic valve. There is severe thickening of the aortic valve. Aortic valve regurgitation is mild. Severe aortic valve stenosis. Procedure Date: 07/13/2021. Aortic valve area, by VTI measures 0.95 cm. Aortic valve mean gradient measures 32.3 mmHg. Aortic valve Vmax measures 3.75 m/s. Aortic valve acceleration time measures 127 msec. FINDINGS  Left Ventricle: Left ventricular ejection fraction, by estimation, is 60 to 65%. The left ventricle has normal function. The left ventricular internal cavity size was normal in size. There is mild concentric left ventricular hypertrophy. Right Ventricle: The right ventricular size is normal. No increase in right ventricular wall thickness. Right ventricular systolic function is normal. Pericardium: There is no evidence of pericardial effusion. Mitral Valve: The mitral valve  is degenerative in appearance. Severe mitral annular calcification. Moderate mitral valve regurgitation, with centrally-directed jet. Tricuspid Valve: The tricuspid valve is not well visualized. Tricuspid valve regurgitation is mild to moderate. Aortic Valve: The aortic valve is tricuspid. There is severe calcifcation of the aortic valve. There is severe thickening of the aortic valve. Aortic valve regurgitation is mild. Severe aortic stenosis is present. Aortic valve mean gradient measures 32.3  mmHg. Aortic valve peak gradient measures 56.1 mmHg. Aortic valve area, by VTI measures 0.95 cm. There is a 23 mm Edwards Sapien prosthetic, stented (TAVR) valve present in the aortic position. Aorta: The aortic root is normal in size and structure. LEFT VENTRICLE PLAX 2D LVOT diam:     2.05 cm LV SV:         97 LV SV Index:   48 LVOT Area:     3.32 cm  AORTIC VALVE AV Area (Vmax):    1.00 cm AV Area (Vmean):   1.03 cm AV Area (VTI):     0.95 cm AV Vmax:           374.63 cm/s AV Vmean:          268.728 cm/s AV VTI:            1.022 m AV Peak Grad:      56.1 mmHg AV Mean Grad:      32.3 mmHg LVOT Vmax:         113.50 cm/s LVOT Vmean:        83.868 cm/s LVOT VTI:          0.292 m LVOT/AV VTI ratio: 0.29  SHUNTS Systemic VTI:  0.29 m Systemic Diam: 2.05 cm Sanda Klein MD Electronically signed by Sanda Klein MD Signature Date/Time: 07/13/2021/9:39:21 AM    Final    Structural Heart Procedure  Result Date: 07/13/2021 See surgical note for result.  CT ANGIO ABDOMEN PELVIS  W &/OR WO CONTRAST  Result  Date: 06/29/2021 CLINICAL DATA:  70 year old female with history of severe aortic stenosis. Preprocedural study prior to potential transcatheter aortic valve replacement (TAVR) procedure. EXAM: CT ANGIOGRAPHY CHEST, ABDOMEN AND PELVIS TECHNIQUE: Multidetector CT imaging through the chest, abdomen and pelvis was performed using the standard protocol during bolus administration of intravenous contrast. Multiplanar  reconstructed images and MIPs were obtained and reviewed to evaluate the vascular anatomy. CONTRAST:  177m OMNIPAQUE IOHEXOL 350 MG/ML SOLN COMPARISON:  No priors. FINDINGS: CTA CHEST FINDINGS Cardiovascular: Heart size is enlarged. There is no significant pericardial fluid, thickening or pericardial calcification. There is aortic atherosclerosis, as well as atherosclerosis of the great vessels of the mediastinum and the coronary arteries, including calcified atherosclerotic plaque in the left main, left anterior descending, left circumflex and right coronary arteries. Severe thickening calcification of the aortic valve. Severe calcifications of the mitral annulus. Status post median sternotomy for CABG. Mediastinum/Lymph Nodes: No pathologically enlarged mediastinal or hilar lymph nodes. Esophagus is unremarkable in appearance. No axillary lymphadenopathy. Lungs/Pleura: No suspicious appearing pulmonary nodules or masses are noted. No acute consolidative airspace disease. No pleural effusions. Musculoskeletal/Soft Tissues: Median sternotomy wires. There are no aggressive appearing lytic or blastic lesions noted in the visualized portions of the skeleton. CTA ABDOMEN AND PELVIS FINDINGS Hepatobiliary: No suspicious cystic or solid hepatic lesions. No intra or extrahepatic biliary ductal dilatation. Gallbladder is normal in appearance. Pancreas: No pancreatic mass. No pancreatic ductal dilatation. No pancreatic or peripancreatic fluid collections or inflammatory changes. Spleen: Unremarkable. Adrenals/Urinary Tract: 1.2 cm low-attenuation lesion in the medial aspect of the lower pole of the right kidney. Left kidney and bilateral adrenal glands are normal in appearance. No hydroureteronephrosis. Urinary bladder is nearly decompressed, but otherwise unremarkable in appearance. Stomach/Bowel: Normal appearance of the stomach. No pathologic dilatation of small bowel or colon. A few scattered colonic diverticulae are  noted, without surrounding inflammatory changes to suggest an acute diverticulitis at this time. Normal appendix. Vascular/Lymphatic: Aortic atherosclerosis, without evidence of aneurysm or dissection in the abdominal or pelvic vasculature. Vascular findings and measurements pertinent to potential TAVR procedure, as detailed below. No lymphadenopathy noted in the abdomen or pelvis. Reproductive: Uterus and ovaries are atrophic. Other: No significant volume of ascites.  No pneumoperitoneum. Musculoskeletal: There are no aggressive appearing lytic or blastic lesions noted in the visualized portions of the skeleton. VASCULAR MEASUREMENTS PERTINENT TO TAVR: AORTA: Minimal Aortic Diameter-15 x 12 mm Severity of Aortic Calcification-moderate to severe RIGHT PELVIS: Right Common Iliac Artery - Minimal Diameter-10.4 x 7.7 mm Tortuosity-mild Calcification - mild Right External Iliac Artery - Minimal Diameter-8.4 x 7.8 mm Tortuosity-mild Calcification-minimal Right Common Femoral Artery - Minimal Diameter-7.3 x 7.3 mm Tortuosity-mild Calcification-mild LEFT PELVIS: Left Common Iliac Artery - Minimal Diameter-11.3 x 10.1 mm Tortuosity-mild Calcification-mild Left External Iliac Artery - Minimal Diameter-7.3 x 7.7 mm Tortuosity-mild-to-moderate Calcification-none Left Common Femoral Artery - Minimal Diameter-6.9 x 7.6 mm Tortuosity-mild Calcification-none Review of the MIP images confirms the above findings. IMPRESSION: 1. Vascular findings and measurements pertinent to potential TAVR procedure, as detailed above. 2. Severe thickening calcification of the aortic valve, compatible with reported clinical history of severe aortic stenosis. 3. Aortic atherosclerosis, in addition to left main and 3 vessel coronary artery disease. Status post median sternotomy for CABG including LIMA. 4. Cardiomegaly. 5. Colonic diverticulosis without evidence of acute diverticulitis at this time. 6. Additional incidental findings, as above.  Electronically Signed   By: DVinnie LangtonM.D.   On: 06/29/2021 14:58   Disposition   Pt is being  discharged home today in good condition.  Follow-up Plans & Appointments     Follow-up Information     Eileen Stanford, PA-C. Go on 07/21/2021.   Specialties: Cardiology, Radiology Why: @ 4pm, please arrive at least 10 minutes early. Contact information: [place] [id number] [phone]                Discharge Instructions     Amb Referral to Cardiac Rehabilitation   Complete by: As directed    Diagnosis: Valve Replacement   Valve: Aortic Comment - TAVR   After initial evaluation and assessments completed: Virtual Based Care may be provided alone or in conjunction with Phase 2 Cardiac Rehab based on patient barriers.: Yes       Discharge Medications   Allergies as of 07/14/2021       Reactions   Biaxin [clarithromycin] Itching   Cardiolite [technetium-14m Itching   Ciprofloxacin Itching   Erythromycin Base Itching   Montelukast Sodium Itching   Niaspan [niacin Er] Other (See Comments)   flushing   Penicillins Itching   Reaction: over 10 years   Prilosec [omeprazole] Itching   Singulair [montelukast] Hives, Itching   Ticlid [ticlopidine Hcl] Itching   Iodinated Diagnostic Agents Itching   Pt has a potential contrast allergy; She states in 2012 her cardiologist told her she had a reaction during the "study". We are unable to determine the study or confirm a reaction. The patient will have a 13 hr prep for future studies. Molecular Imaging states their injection does not cause any reactions.        Medication List     TAKE these medications    acetaminophen 500 MG tablet Commonly known as: TYLENOL Take 1,000 mg by mouth every 6 (six) hours as needed for mild pain.   Advair Diskus 250-50 MCG/ACT Aepb Generic drug: fluticasone-salmeterol INHALE 1 PUFF INTO THE LUNGS IN THE MORNING AND AT BEDTIME. What changed:  additional instructions   albuterol 108 (90 Base) MCG/ACT inhaler Commonly known as: Ventolin HFA INHALE 2 PUFFS BY MOUTH EVERY 4-6 HOURS AS NEEDED FOR COUGH OR WHEEZE What changed:  how much to take how to take this when to take this additional instructions   ALPRAZolam 0.25 MG tablet Commonly known as: XANAX Take 1 tablet (0.25 mg total) by mouth 2 (two) times daily as needed for anxiety   aspirin 81 MG tablet Take 81 mg by mouth daily.   cetirizine 10 MG tablet Commonly known as: ZYRTEC Take 10 mg by mouth daily.   ciclopirox 8 % solution Commonly known as: PENLAC APPLY TO THE AFFECTED AREA(S) AT BEDTIME AND EVERY 7 DAYS CLEAN NAILS WITH ALCOHOL   clopidogrel 75 MG tablet Commonly known as: PLAVIX Take 1 tablet (75 mg total) by mouth daily.   Difluprednate 0.05 % Emul Commonly known as: Durezol Start 2 days prior to surgery. use 1 drop in right eye 4 times daily. use as directed after surgery What changed:  how much to take how to take this when to take this additional instructions   Estradiol 10 MCG Tabs vaginal tablet INSERT 1 TABLET TWICE A WEEK VAGINALLY AS DIRECTED   ezetimibe 10 MG tablet Commonly known as: ZETIA Take 1 tablet (10 mg total) by mouth daily. What changed: when to take this   famotidine 40 MG tablet Commonly known as: PEPCID TAKE ONE TABLET BY MOUTH ONCE DAILY. INCREASE TO ONE TABLET TWICE DAILY DURING FLARE-UP. What changed:  how  much to take how to take this when to take this   Farxiga 10 MG Tabs tablet Generic drug: dapagliflozin propanediol Take 1 tablet by mouth once a day (Take 1 tablet by mouth once a day)   ferrous sulfate 325 (65 FE) MG tablet Take 325 mg by mouth every other day.   fluticasone 50 MCG/ACT nasal spray Commonly known as: FLONASE INSTILL 1 TO 2 SPRAYS INTO EACH NOSTRIL DAILY FOR STUFFY NOSE OR DRAINAGE What changed: See the new instructions.   FreeStyle Lite w/Device Kit USE TO TEST TWO TIMES DAILY  PRIOR TO MEALS   magnesium gluconate 500 MG tablet Commonly known as: MAGONATE Take 500 mg by mouth daily as needed (cramps).   metFORMIN 500 MG 24 hr tablet Commonly known as: GLUCOPHAGE-XR TAKE 1 TABLET BY MOUTH TWICE DAILY WITH A MEAL Notes to patient: Okay to resume Metformin after 48 hours after contrast dye exposure (9/15PM)    metoprolol succinate 25 MG 24 hr tablet Commonly known as: TOPROL-XL TAKE 1 TABLET BY MOUTH TWICE DAILY What changed: how much to take   moxifloxacin 0.5 % ophthalmic solution Commonly known as: VIGAMOX instil 1 drop in right eye 4 times a day starting 2 days prior to surgery & 2 drops morning of surgery   MULTIVITAL PO Take 1 tablet by mouth daily.   predniSONE 50 MG tablet Commonly known as: DELTASONE Take 13 hours before TAVR (6:30pm on 9/12) and 6 hours before TAVR (1:30am on 9/13). The last dose will be given to you at the hospital.   PROBIOTIC DAILY PO Take 1 capsule by mouth at bedtime. Ultra Flora IB   Qvar RediHaler 80 MCG/ACT inhaler Generic drug: beclomethasone Use 3 puffs 3 times daily during asthma flares. What changed:  how much to take how to take this when to take this reasons to take this additional instructions   rosuvastatin 40 MG tablet Commonly known as: CRESTOR Take 1 tablet (40 mg total) by mouth daily. Please keep upcoming appt in December with Dr. Tamala Julian for future refills. Thank you What changed:  when to take this additional instructions   True Metrix Blood Glucose Test test strip Generic drug: glucose blood   Accu-Chek Guide test strip Generic drug: glucose blood USE WHEN CHECKING BLOOD SUGAR TWICE DAILY PRIOR TO MEALS   FREESTYLE LITE test strip Generic drug: glucose blood USE TO TEST TWICE A DAY BEFORE MEALS   TRUEplus Lancets 30G Misc   freestyle lancets USE TO TEST TWO TIMES DAILY PRIOR TO MEALS   valACYclovir 500 MG tablet Commonly known as: VALTREX Take 500 mg by mouth daily as needed (cold  sores/blisters). Reported on 12/01/2015   vitamin C 500 MG tablet Commonly known as: ASCORBIC ACID Take 500 mg by mouth daily.   VITAMIN K2-VITAMIN D3 PO Take 1 tablet by mouth every other day.   zinc gluconate 50 MG tablet Take 50 mg by mouth daily.        Outstanding Labs/Studies   none  Duration of Discharge Encounter   Greater than 30 minutes including physician time.  SignedAngelena Form, PA-C 07/14/2021, 10:34 AM [phone]  Patient seen, examined. Available data reviewed. Agree with findings, assessment, and plan as outlined by Nell Range, PA-C.  The patient is independently interviewed and examined.  She is alert, oriented, in no distress.  HEENT is normal, JVP is normal, lungs are clear bilaterally, heart is regular rate and rhythm with a 2/6 systolic ejection murmur at the right upper sternal  border, no diastolic murmur, bilateral groin sites are clear with no hematoma or ecchymosis.  Lower extremities have no edema.  Telemetry shows normal sinus rhythm with no significant conduction delays or AV block.  The patient is clinically stable for hospital discharge today.  Her echocardiogram is personally reviewed and demonstrates vigorous LV systolic function, a mean transaortic gradient of 20 mmHg, and no paravalvular regurgitation.  The mildly elevated aortic valve gradients are expected in this patient with a small annulus who was treated with a 23 mm Edwards SAPIEN 3 ultra valve.  Follow-up is arranged as above.  She will take aspirin and clopidogrel for 6 months.  Sherren Mocha, M.D. 07/14/2021 12:55 PM

## 2021-07-14 NOTE — Progress Notes (Signed)
CARDIAC REHAB PHASE I   PRE:  Rate/Rhythm: 68 SR  BP:  Supine: 111/54  Sitting:   Standing:    SaO2: 99%RA  MODE:  Ambulation: 470 ft   POST:  Rate/Rhythm: 110 ST   to 76 with rest  BP:  Supine:   Sitting: 106/72  Standing:    SaO2: 99%RA [id number] Pt assisted with bath as wet from purewick. Pt then walked 470 ft on RA with steady gait. Tolerated well. Slightly SOB. To recliner after walk. Gave pt walking instructions and heart healthy diet. Discussed CRP 2. Pt has attended in the past here. Referral to [facility] made. Sats good on RA. HR elevated with activity but quickly recovered with rest.   Graylon Good, RN BSN  07/14/2021 8:58 AM

## 2021-07-14 NOTE — Progress Notes (Signed)
Echocardiogram 2D Echocardiogram has been performed.  Oneal Deputy William Schake RDCS 07/14/2021, 11:20 AM

## 2021-07-14 NOTE — Plan of Care (Signed)
  Problem: Health Behavior/Discharge Planning: Goal: Ability to manage health-related needs will improve Outcome: Progressing   Problem: Clinical Measurements: Goal: Will remain free from infection Outcome: Progressing Goal: Diagnostic test results will improve Outcome: Progressing   

## 2021-07-15 ENCOUNTER — Telehealth: Payer: Self-pay | Admitting: Physician Assistant

## 2021-07-15 LAB — TYPE AND SCREEN
ABO/RH(D): A POS
Antibody Screen: POSITIVE
Unit division: 0
Unit division: 0

## 2021-07-15 LAB — BPAM RBC
Blood Product Expiration Date: 202209262359
Blood Product Expiration Date: 202210132359
Unit Type and Rh: 6200
Unit Type and Rh: 6200

## 2021-07-15 MED FILL — Potassium Chloride Inj 2 mEq/ML: INTRAVENOUS | Qty: 40 | Status: AC

## 2021-07-15 MED FILL — Heparin Sodium (Porcine) Inj 1000 Unit/ML: INTRAMUSCULAR | Qty: 30 | Status: AC

## 2021-07-15 MED FILL — Magnesium Sulfate Inj 50%: INTRAMUSCULAR | Qty: 10 | Status: AC

## 2021-07-15 NOTE — Telephone Encounter (Signed)
  [place] VALVE TEAM   Patient contacted regarding discharge from [facility] on 9/14  Patient understands to follow up with provider Nell Range on 9/21 at [facility].  Patient understands discharge instructions? yes Patient understands medications and regimen? yes Patient understands to bring all medications to this visit? yes  Angelena Form PA-C  [facility]

## 2021-07-16 ENCOUNTER — Other Ambulatory Visit: Payer: Self-pay | Admitting: *Deleted

## 2021-07-16 ENCOUNTER — Encounter: Payer: Self-pay | Admitting: *Deleted

## 2021-07-16 NOTE — Patient Outreach (Addendum)
[facility]) Care Management  07/16/2021  Deanna Simmons [birth year] [id number]  Transition of care call/case closure   Referral received:07/13/21 Initial outreach: 07/14/21 Insurance: [place] [employer]    Subjective: Initial successful telephone call to patient's preferred number in order to complete transition of care assessment; 2 HIPAA identifiers verified. Explained purpose of call and completed transition of care assessment.  She states that she feels she is doing alright. She denies any post-procedure problems,say procedure site unremarkable dressing is off now. She is  tolerating diet, she was concerned regarding weight loss since being on farxiga, states someone told her she looked as if she had lost 50 lbs, reviewed recent office visits weights  10/09/20 226lbs, 05/21/21 - 223 lbs and 211 lbs on 07/15/21,  home weight home today 210.8, discussed medication benefit for diabetes, kidney disease at some weight loss associated, she will discuss with provider. She discussed placing call to Dr. Inda Merlin office questioning when and it she needs to continue to take Metformin  denies bowel or bladder problems. Patient has her children  assisting with her recovery. She reports tolerating progressive walking time as recommended by cardiac rehab, reports some noted increase in heart rate during walk but recovers after rest , wearing smart watch. She continues to monitor blood pressure daily 111/72 HR 70.   Reviewed accessing the following [place] Benefits : She discussed ongoing health issues and reports being enrolled in Active health management health coach program for disease management .  She does have the hospital indemnity plan and contact number to file a claim, she plans to contact matrix for FMLA. He uses a Company [facility] outpatient pharmacy at [facility].   Objective:  Deanna Simmons was hospitalized at [facility] 9/13-9/14 for Transcatheter Aortic  valve replacment Comorbidities include: Severe Aortic stenosis, CABG, HTN, Diabetes, A1c 6.0, hypertension , stroke.   She was discharged to home on 07/14/21 without the need for home health services or DME.   Assessment:  Patient voices good understanding of all discharge instructions.  See transition of care flowsheet for assessment details.   Plan:  Reviewed hospital discharge diagnosis of TAVR   and discharge treatment plan using hospital discharge instructions, assessing medication adherence, reviewing problems requiring provider notification, and discussing the importance of follow up with surgeon, primary care provider and/or specialists as directed.  Reviewed [place] healthy lifestyle program information to receive discounted premium for  2023   Step 1: Get  your annual physical  Step 2: Complete your health assessment  Step 3:Identify your current health status and complete the corresponding action step between October 31, 2020 and July 01, 2021.    Using [employer] website, verified that patient is an active participate in [employer]'s Active Health Management chronic disease management program.    No ongoing care management needs identified so will close case to [facility] services and route successful outreach letter with [place] Management pamphlet and 24 Hour Nurse Line Magnet to [name] clinical pool to be mailed to patient's home address.  Thanked patient for their services to [facility].    Joylene Draft, RN, BSN  [place] Management Coordinator  [phone]- Mobile [phone]- Toll Free Main Office

## 2021-07-19 ENCOUNTER — Telehealth (HOSPITAL_COMMUNITY): Payer: Self-pay

## 2021-07-19 ENCOUNTER — Other Ambulatory Visit (HOSPITAL_COMMUNITY): Payer: Self-pay

## 2021-07-19 NOTE — Telephone Encounter (Signed)
Called patient to see if she is interested in the Cardiac Rehab Program. Patient expressed interest. Explained scheduling process and went over insurance, patient verbalized understanding. Will contact patient for scheduling once f/u has been completed. 

## 2021-07-19 NOTE — Telephone Encounter (Signed)
Pt insurance is active and benefits verified through [place], DED 0/0 met, out of pocket $7,900/$7,900 met, co-insurance 0%. no pre-authorization required. [place]/[facility] 07/19/2021@2 :01pm, REF# [id number]   Will contact patient to see if she is interested in the Cardiac Rehab Program. If interested, patient will need to complete follow up appt. Once completed, patient will be contacted for scheduling upon review by the RN Navigator.

## 2021-07-20 ENCOUNTER — Other Ambulatory Visit: Payer: Self-pay | Admitting: Allergy and Immunology

## 2021-07-20 ENCOUNTER — Other Ambulatory Visit (HOSPITAL_COMMUNITY): Payer: Self-pay

## 2021-07-21 ENCOUNTER — Ambulatory Visit: Payer: 59 | Admitting: Physician Assistant

## 2021-07-21 ENCOUNTER — Other Ambulatory Visit: Payer: Self-pay

## 2021-07-21 ENCOUNTER — Encounter: Payer: Self-pay | Admitting: *Deleted

## 2021-07-21 ENCOUNTER — Other Ambulatory Visit (HOSPITAL_COMMUNITY): Payer: Self-pay

## 2021-07-21 ENCOUNTER — Encounter: Payer: Self-pay | Admitting: Physician Assistant

## 2021-07-21 VITALS — BP 112/80 | HR 79 | Ht 65.0 in | Wt 211.4 lb

## 2021-07-21 DIAGNOSIS — I25708 Atherosclerosis of coronary artery bypass graft(s), unspecified, with other forms of angina pectoris: Secondary | ICD-10-CM

## 2021-07-21 DIAGNOSIS — Z8673 Personal history of transient ischemic attack (TIA), and cerebral infarction without residual deficits: Secondary | ICD-10-CM | POA: Diagnosis not present

## 2021-07-21 DIAGNOSIS — M1710 Unilateral primary osteoarthritis, unspecified knee: Secondary | ICD-10-CM

## 2021-07-21 DIAGNOSIS — M179 Osteoarthritis of knee, unspecified: Secondary | ICD-10-CM

## 2021-07-21 DIAGNOSIS — Z952 Presence of prosthetic heart valve: Secondary | ICD-10-CM

## 2021-07-21 DIAGNOSIS — Z0279 Encounter for issue of other medical certificate: Secondary | ICD-10-CM

## 2021-07-21 MED ORDER — AZITHROMYCIN 500 MG PO TABS
ORAL_TABLET | ORAL | 3 refills | Status: DC
  Filled 2021-07-21: qty 4, 4d supply, fill #0

## 2021-07-21 NOTE — Patient Instructions (Signed)
Medication Instructions:  No changes  *If you need a refill on your cardiac medications before your next appointment, please call your pharmacy*   Lab Work: none   Testing/Procedures: As scheduled   Follow-Up: As scheduled   Other Instructions Return to work note provided today.

## 2021-07-21 NOTE — Addendum Note (Signed)
Addended by: Rodman Key on: 07/21/2021 04:32 PM   Modules accepted: Orders

## 2021-07-21 NOTE — Progress Notes (Signed)
HEART AND [place]                                     Cardiology Office Note:    Date:  07/21/2021   ID:  [name], DOB Dec 14, 1950, MRN [id number]  PCP:  Josetta Huddle, MD  [facility] Cardiologist:  Sinclair Grooms, MD / Dr. Burt Knack & Dr. Cyndia Bent (TAVR) [place] Electrophysiologist:  None   Referring MD: Josetta Huddle, MD   [facility] s/p TAVR  History of Present Illness:    Deanna Simmons is a 70 y.o. female with a hx of  CAD s/p remote CABG x1V in 2004 by Dr. Cyndia Bent with a SVG to PDA, HTN, asthma, type 2 diabetes, SLE, prior stroke with right retinal artery occlusion and more recent TIA in June 2022 and severe AS s/p TAVR (07/13/21) who presents to clinic for follow up.   She has a history of aortic stenosis followed by Dr. Tamala Julian.  She recently developed exertional shortness of breath and fatigue, especially when climbing up steps.  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  Repeat echocardiogram on 05/05/21 showed EF 55 to 60%, severe aortic stenosis with a mean gradient of 38 mmHg, and mild to moderate aortic regurgitation as well as mild mitral stenosis/mitral regurgitation. L/RHC on 06/11/21 showed native vessel CAD with a total occlusion of her RCA, patent LAD, patent circumflex, patent 70 year old SVG to distal RCA with an ostial proximal 50 to 70% narrowing.  Medical therapy was recommended.   She was evaluated by the multidisciplinary valve team and underwent successful TAVR with a 23 mm Edwards Sapien 3 Ultra THV via the TF approach on 07/13/21. Post operative echo showed EF 65%, normally functioning TAVR with a mean gradient of 20 mmHg and no PVL. She was continued on Asprin and started on Plaivx 40m daily.  Today the patient presents to clinic for follow up. She is doing great. No CP or SOB. No LE edema, orthopnea or PND. No dizziness  or syncope. No blood in stool or urine. No palpitations. Wants to get eye surgery first and then knee surgery.    Past Medical History:  Diagnosis Date   Abnormal finding on Pap smear, ASCUS    Anemia    Asthma    Chondromalacia of knee    Coronary artery disease    Diabetes mellitus without complication ([place]    Diverticulum 12/11/2002   in cecum   Fibroids    h/o   GERD (gastroesophageal reflux disease)    H/O tinea cruris 02/28/2010   History of CVA (cerebrovascular accident)    Hypercholesteremia    Hypertension    Obesity    Pneumonia 07/01/2008   S/P TAVR (transcatheter aortic valve replacement) 07/13/2021   s/p TAVR w/ a 230mEdwards S3U via the TF approach by Dr. CoBurt Knacknd Dr. BaCyndia Bent  Synovitis of knee    Systemic lupus erythematosus ([place]   with nephritis   Vulvitis    h/o    Past Surgical History:  Procedure Laterality Date   CESAREAN SECTION     CORONARY ARTERY BYPASS GRAFT     Endometrial curettage  03/29/2000   INTRAOPERATIVE TRANSTHORACIC ECHOCARDIOGRAM Left 07/13/2021   Procedure: INTRAOPERATIVE TRANSTHORACIC ECHOCARDIOGRAM;  Surgeon: Sherren Mocha, MD;  Location: [facility];  Service: Open Heart Surgery;  Laterality: Left;   RIGHT/LEFT HEART CATH AND CORONARY/GRAFT ANGIOGRAPHY N/A 06/11/2021   Procedure: RIGHT/LEFT HEART CATH AND CORONARY/GRAFT ANGIOGRAPHY;  Surgeon: Belva Crome, MD;  Location: [facility];  Service: Cardiovascular;  Laterality: N/A;   ROTATOR CUFF REPAIR     TRANSCATHETER AORTIC VALVE REPLACEMENT, TRANSFEMORAL N/A 07/13/2021   Procedure: TRANSCATHETER AORTIC VALVE REPLACEMENT, TRANSFEMORAL;  Surgeon: Sherren Mocha, MD;  Location: [place];  Service: Open Heart Surgery;  Laterality: N/A;   TUBAL LIGATION     ULTRASOUND GUIDANCE FOR VASCULAR ACCESS Bilateral 07/13/2021   Procedure: ULTRASOUND GUIDANCE FOR VASCULAR ACCESS;  Surgeon: Sherren Mocha, MD;  Location: [place];  Service: Open Heart Surgery;  Laterality: Bilateral;   UPPER  GASTROINTESTINAL ENDOSCOPY  12/11/2002    Current Medications: Current Meds  Medication Sig   acetaminophen (TYLENOL) 500 MG tablet Take 1,000 mg by mouth every 6 (six) hours as needed for mild pain.   ADVAIR DISKUS 250-50 MCG/DOSE AEPB INHALE 1 PUFF INTO THE LUNGS IN THE MORNING AND AT BEDTIME. (Patient taking differently: Inhale 1 puff into the lungs 2 (two) times daily.)   albuterol (VENTOLIN HFA) 108 (90 Base) MCG/ACT inhaler INHALE 2 PUFFS BY MOUTH EVERY 4-6 HOURS AS NEEDED FOR COUGH OR WHEEZE (Patient taking differently: Inhale 2 puffs into the lungs See admin instructions. Every 4 to 6 hours as needed for cough and/or wheeze)   ALPRAZolam (XANAX) 0.25 MG tablet Take 1 tablet (0.25 mg total) by mouth 2 (two) times daily as needed for anxiety   aspirin 81 MG tablet Take 81 mg by mouth daily.   beclomethasone (QVAR REDIHALER) 80 MCG/ACT inhaler Use 3 puffs 3 times daily during asthma flares. (Patient taking differently: Inhale 3 puffs into the lungs daily as needed (asthma flare ups).)   Blood Glucose Monitoring Suppl (FREESTYLE LITE) w/Device KIT USE TO TEST TWO TIMES DAILY PRIOR TO MEALS   cetirizine (ZYRTEC) 10 MG tablet Take 10 mg by mouth daily.   ciclopirox (PENLAC) 8 % solution APPLY TO THE AFFECTED AREA(S) AT BEDTIME AND EVERY 7 DAYS CLEAN NAILS WITH ALCOHOL   clopidogrel (PLAVIX) 75 MG tablet Take 1 tablet (75 mg total) by mouth daily.   dapagliflozin propanediol (FARXIGA) 10 MG TABS tablet Take 1 tablet by mouth once a day   Difluprednate (DUREZOL) 0.05 % EMUL Start 2 days prior to surgery. use 1 drop in right eye 4 times daily. use as directed after surgery (Patient taking differently: Place 1 drop into the right eye See admin instructions. 4 times a day currently, will titrate down to 3 times a day for 1 week, then 2 drops a day for 1 week, then 1 drop a day until bottle is empty)   Estradiol 10 MCG TABS vaginal tablet INSERT 1 TABLET TWICE A WEEK VAGINALLY AS DIRECTED   ezetimibe  (ZETIA) 10 MG tablet Take 1 tablet (10 mg total) by mouth daily. (Patient taking differently: Take 10 mg by mouth every evening.)   famotidine (PEPCID) 40 MG tablet TAKE ONE TABLET BY MOUTH ONCE DAILY. INCREASE TO ONE TABLET TWICE DAILY DURING FLARE-UP. (Patient taking differently: Take 40 mg by mouth daily.)   ferrous sulfate 325 (65 FE) MG tablet Take 325 mg by mouth daily.   fluticasone (FLONASE) 50 MCG/ACT nasal spray INSTILL 1 TO 2 SPRAYS INTO EACH NOSTRIL DAILY FOR STUFFY NOSE OR DRAINAGE (Patient taking differently: Place 2 sprays into both nostrils  daily as needed for allergies.)   glucose blood test strip USE WHEN CHECKING BLOOD SUGAR TWICE DAILY PRIOR TO MEALS   Lancets (FREESTYLE) lancets USE TO TEST TWO TIMES DAILY PRIOR TO MEALS   magnesium gluconate (MAGONATE) 500 MG tablet Take 500 mg by mouth daily as needed (cramps).   metoprolol succinate (TOPROL-XL) 25 MG 24 hr tablet TAKE 1 TABLET BY MOUTH TWICE DAILY (Patient taking differently: Take 25 mg by mouth 2 (two) times daily.)   Multiple Vitamins-Minerals (MULTIVITAL PO) Take 1 tablet by mouth daily.   nitroGLYCERIN (NITROSTAT) 0.4 MG SL tablet Place 1 tablet (0.4 mg total) under the tongue every 5 (five) minutes as needed for up to 25 days for chest pain.   Probiotic Product (PROBIOTIC DAILY PO) Take 1 capsule by mouth at bedtime. Ultra Flora IB   rosuvastatin (CRESTOR) 40 MG tablet Take 1 tablet (40 mg total) by mouth daily. Please keep upcoming appt in December with Dr. Tamala Julian for future refills. Thank you (Patient taking differently: Take 40 mg by mouth at bedtime.)   TRUE METRIX BLOOD GLUCOSE TEST test strip    TRUEPLUS LANCETS 30G MISC    valACYclovir (VALTREX) 500 MG tablet Take 500 mg by mouth daily as needed (cold sores/blisters). Reported on 12/01/2015   vitamin C (ASCORBIC ACID) 500 MG tablet Take 500 mg by mouth daily.   Vitamin D-Vitamin K (VITAMIN K2-VITAMIN D3 PO) Take 1 tablet by mouth every other day.   zinc gluconate 50  MG tablet Take 50 mg by mouth daily.     Allergies:   Biaxin [clarithromycin], Cardiolite [technetium-54m, Ciprofloxacin, Erythromycin base, Montelukast sodium, Niaspan [niacin er], Penicillins, Prilosec [omeprazole], Singulair [montelukast], Ticlid [ticlopidine hcl], and Iodinated diagnostic agents   Social History   Socioeconomic History   Marital status: Married    Spouse name: BAlvester Chou  Number of children: 4   Years of education: 12+   Highest education level: Not on file  Occupational History   Occupation: CARE MANAGEMENT AS[employer]    Employer: [employer] Tobacco Use   Smoking status: Former   Smokeless tobacco: Never  Substance and Sexual Activity   Alcohol use: Yes    Comment: occasional   Drug use: No   Sexual activity: Not on file  Other Topics Concern   Not on file  Social History Narrative   Lives with her husband. Their children are grown, 1 [place]  [place] and one [place]   Right handed   Social Determinants of Health   Financial Resource Strain: Not on file  Food Insecurity: Not on file  Transportation Needs: Not on file  Physical Activity: Not on file  Stress: Not on file  Social Connections: Not on file     Family History: The patient's family history includes Breast cancer (age of onset: 724 in her mother; Diabetes in her father and mother; Heart attack in her paternal uncle; Hyperlipidemia in her father; Ovarian cancer in her cousin; Prostate cancer in her father; Stroke in her mother.  ROS:   Please see the history of present illness.    All other systems reviewed and are negative.  EKGs/Labs/Other Studies Reviewed:    The following studies were reviewed today:  TAVR OPERATIVE NOTE     Date of Procedure:                07/13/2021   Preoperative Diagnosis:      Severe Aortic Stenosis  Postoperative Diagnosis:    Same    Procedure:        Transcatheter Aortic Valve Replacement -  Percutaneous Right Transfemoral Approach             Edwards Sapien 3 Ultra THV (size 23 mm, model # 9750TFX, serial # [id number])              Co-Surgeons:                        Gaye Pollack, MD and Sherren Mocha, MD      Anesthesiologist:                  Suella Broad, MD   Echocardiographer:              Sanda Klein, MD   Pre-operative Echo Findings: Severe aortic stenosis Normal left ventricular systolic function   Post-operative Echo Findings: No paravalvular leak Normal left ventricular systolic function _____________     Echo 07/14/21: IMPRESSIONS  1. Left ventricular ejection fraction, by estimation, is 65 to 70%. The  left ventricle has normal function. The left ventricle has no regional  wall motion abnormalities. There is mild concentric left ventricular  hypertrophy. Left ventricular diastolic  parameters are consistent with Grade I diastolic dysfunction (impaired  relaxation). Elevated left atrial pressure.   2. Right ventricular systolic function is normal. The right ventricular  size is normal. Tricuspid regurgitation signal is inadequate for assessing  PA pressure.   3. Left atrial size was moderately dilated.   4. The mitral valve is normal in structure. Mild mitral valve  regurgitation. The mean mitral valve gradient is 3.0 mmHg with average  heart rate of 66 bpm. Moderate to severe mitral annular calcification.   5. The aortic valve has been repaired/replaced. Aortic valve  regurgitation is not visualized. There is a 23 mm Edwards Ultra, stented  (TAVR) valve present in the aortic position. Procedure Date: 07/13/2021.  Aortic valve area, by VTI measures 1.45 cm.  Aortic valve mean gradient measures 20.0 mmHg. Aortic valve Vmax measures  2.90 m/s. Aortic valve acceleration time measures 81 msec.   6. The inferior vena cava is normal in size with greater than 50%  respiratory variability, suggesting right atrial pressure of 3 mmHg.   EKG:  EKG is  ordered today.  The ekg ordered today demonstrates sinus with HR 79  Recent Labs: 07/09/2021: ALT 23 07/14/2021: BUN 12; Creatinine, Ser 0.66; Hemoglobin 10.4; Platelets 216; Potassium 3.9; Sodium 140  Recent Lipid Panel    Component Value Date/Time   CHOL 120 03/11/2014 0841   TRIG 65.0 03/11/2014 0841   HDL 41.00 03/11/2014 0841   CHOLHDL 3 03/11/2014 0841   VLDL 13.0 03/11/2014 0841   LDLCALC 66 03/11/2014 0841     Risk Assessment/Calculations:       Physical Exam:    VS:  BP 112/80   Pulse 79   Ht 5' 5"  (1.651 m)   Wt 211 lb 6.4 oz (95.9 kg)   SpO2 98%   BMI 35.18 kg/m     Wt Readings from Last 3 Encounters:  07/21/21 211 lb 6.4 oz (95.9 kg)  07/14/21 211 lb 13.8 oz (96.1 kg)  07/09/21 213 lb 12.8 oz (97 kg)     GEN:  Well nourished, well developed in no acute distress HEENT: Normal NECK: No JVD LYMPHATICS: No lymphadenopathy CARDIAC: RRR, soft flow murmur. No rubs, gallops RESPIRATORY:  Clear to auscultation  without rales, wheezing or rhonchi  ABDOMEN: Soft, non-tender, non-distended MUSCULOSKELETAL:  No edema; No deformity  SKIN: Warm and dry.  Groin sites clear without hematoma or ecchymosis  NEUROLOGIC:  Alert and oriented x 3 PSYCHIATRIC:  Normal affect   ASSESSMENT:    1. S/P TAVR (transcatheter aortic valve replacement)   2. Coronary artery disease of bypass graft of native heart with stable angina pectoris ([place])   3. History of CVA (cerebrovascular accident)   4. Osteoarthritis of knee, unspecified laterality, unspecified osteoarthritis type    PLAN:    In order of problems listed above:  Severe AS s/p TAVR: doing great 1 week out from TAVR. Groin sites are clear. ECG with no HAVB. Continue on aspirin and plavix. I will see her back 10/6 for 1 month follow up and echo.    CAD s/p CABG: pre TAVR L/RHC on 06/11/21 showed native vessel CAD with a total occlusion of her RCA, patent LAD, patent circumflex, patent 70 year old SVG to distal RCA with an  ostial proximal 50 to 70% narrowing. Continue medical therapy.    Hx of CVA: continue aspirin and plavix.  OA: will eventually nee surgery for this but wants to wait until after doing an eye surgery.   Cardiac Rehabilitation Eligibility Assessment: The patient is ready to start Cardiac Rehab from a cardiac standpoint.    Medication Adjustments/Labs and Tests Ordered: Current medicines are reviewed at length with the patient today.  Concerns regarding medicines are outlined above.  Orders Placed This Encounter  Procedures   EKG 12-Lead    No orders of the defined types were placed in this encounter.   Patient Instructions  Medication Instructions:  No changes  *If you need a refill on your cardiac medications before your next appointment, please call your pharmacy*   Lab Work: none   Testing/Procedures: As scheduled   Follow-Up: As scheduled   Other Instructions Return to work note provided today.    Signed, Angelena Form, PA-C  07/21/2021 2:56 PM    [facility]

## 2021-07-26 ENCOUNTER — Other Ambulatory Visit (HOSPITAL_COMMUNITY): Payer: Self-pay

## 2021-07-26 ENCOUNTER — Other Ambulatory Visit: Payer: Self-pay | Admitting: Obstetrics & Gynecology

## 2021-07-26 DIAGNOSIS — Z1231 Encounter for screening mammogram for malignant neoplasm of breast: Secondary | ICD-10-CM

## 2021-07-26 MED ORDER — VALACYCLOVIR HCL 500 MG PO TABS
500.0000 mg | ORAL_TABLET | Freq: Two times a day (BID) | ORAL | 3 refills | Status: DC | PRN
  Filled 2021-07-26: qty 60, 30d supply, fill #0

## 2021-07-28 ENCOUNTER — Other Ambulatory Visit (HOSPITAL_COMMUNITY): Payer: Self-pay

## 2021-07-28 ENCOUNTER — Telehealth (HOSPITAL_COMMUNITY): Payer: Self-pay

## 2021-07-28 NOTE — Telephone Encounter (Signed)
Pharmacy Transitions of Care Follow-up Telephone Call  Date of discharge: 07/14/21  Discharge Diagnosis: transcatheter aortic valve replacement  How have you been since you were released from the hospital?  Patient doing well, she has returned to work. No questions about meds at this time.  Medication changes made at discharge:  - START: Clopidogrel  Medication changes verified by the patient? Yes    Medication Accessibility:  Home Pharmacy: [facility]   Was the patient provided with refills on discharged medications? Yes   Have all prescriptions been transferred from [facility] to home pharmacy? Yes   Is the patient able to afford medications? Has insurance    Medication Review:  CLOPIDOGREL (PLAVIX) Clopidogrel 75 mg once daily.  - Educated patient on expected duration of therapy of ASA with clopidogrel.  - Advised patient of medications to avoid (NSAIDs, ASA)  - Educated that Tylenol (acetaminophen) will be the preferred analgesic to prevent risk of bleeding  - Emphasized importance of monitoring for signs and symptoms of bleeding (abnormal bruising, prolonged bleeding, nose bleeds, bleeding from gums, discolored urine, black tarry stools)  - Advised patient to alert all providers of anticoagulation therapy prior to starting a new medication or having a procedure    Follow-up Appointments:  PCP Hospital f/u appt confirmed? None currently scheduled  Specialist Hospital f/u appt confirmed?  Scheduled to see Dr. Grandville Silos on 07/21/21 @ Cardiology.   If their condition worsens, is the pt aware to call PCP or go to the Emergency Dept.? Yes  Final Patient Assessment: Patient has follow up scheduled and refills at home pharmacy

## 2021-08-02 ENCOUNTER — Other Ambulatory Visit (HOSPITAL_COMMUNITY): Payer: Self-pay

## 2021-08-03 ENCOUNTER — Other Ambulatory Visit: Payer: Self-pay | Admitting: Interventional Cardiology

## 2021-08-03 ENCOUNTER — Other Ambulatory Visit (HOSPITAL_COMMUNITY): Payer: Self-pay

## 2021-08-03 MED ORDER — ROSUVASTATIN CALCIUM 40 MG PO TABS
40.0000 mg | ORAL_TABLET | Freq: Every day | ORAL | 0 refills | Status: DC
  Filled 2021-08-03: qty 90, 90d supply, fill #0

## 2021-08-05 ENCOUNTER — Other Ambulatory Visit: Payer: Self-pay | Admitting: Physician Assistant

## 2021-08-05 ENCOUNTER — Ambulatory Visit (HOSPITAL_COMMUNITY): Payer: 59 | Attending: Cardiology

## 2021-08-05 ENCOUNTER — Other Ambulatory Visit: Payer: Self-pay

## 2021-08-05 ENCOUNTER — Ambulatory Visit: Payer: 59 | Admitting: Physician Assistant

## 2021-08-05 VITALS — BP 132/72 | HR 75 | Ht 65.0 in | Wt 215.0 lb

## 2021-08-05 DIAGNOSIS — M179 Osteoarthritis of knee, unspecified: Secondary | ICD-10-CM | POA: Diagnosis not present

## 2021-08-05 DIAGNOSIS — Z952 Presence of prosthetic heart valve: Secondary | ICD-10-CM | POA: Diagnosis not present

## 2021-08-05 DIAGNOSIS — I25708 Atherosclerosis of coronary artery bypass graft(s), unspecified, with other forms of angina pectoris: Secondary | ICD-10-CM

## 2021-08-05 DIAGNOSIS — Z8673 Personal history of transient ischemic attack (TIA), and cerebral infarction without residual deficits: Secondary | ICD-10-CM

## 2021-08-05 LAB — ECHOCARDIOGRAM COMPLETE
AR max vel: 1.18 cm2
AV Area VTI: 1.01 cm2
AV Area mean vel: 1.15 cm2
AV Mean grad: 17 mmHg
AV Peak grad: 30 mmHg
Ao pk vel: 2.74 m/s
Area-P 1/2: 3.31 cm2
MV VTI: 1.87 cm2
S' Lateral: 2.5 cm

## 2021-08-05 NOTE — Progress Notes (Signed)
[facility] [place]                                     Cardiology Office Note:    Date:  08/05/2021   ID:  Deanna Simmons, DOB Jun 01, 1951, MRN [id number]  PCP:  Josetta Huddle, MD  [facility] Cardiologist:  Sinclair Grooms, MD / Dr. Burt Knack & Dr. Cyndia Bent (TAVR) [place] Electrophysiologist:  None   Referring MD: Josetta Huddle, MD   1 month s/p TAVR  History of Present Illness:    Deanna Simmons is a 70 y.o. female with a hx of  CAD s/p remote CABG x1V in 2004 by Dr. Cyndia Bent with a SVG to PDA, HTN, asthma, type 2 diabetes, SLE, prior stroke with right retinal artery occlusion and more recent TIA in June 2022 and severe AS s/p TAVR (07/13/21) who presents to clinic for follow up.   She has a history of aortic stenosis followed by Dr. Tamala Julian.  She recently developed exertional shortness of breath and fatigue, especially when climbing up steps.  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  Repeat echocardiogram on 05/05/21 showed EF 55 to 60%, severe aortic stenosis with a mean gradient of 38 mmHg, and mild to moderate aortic regurgitation as well as mild mitral stenosis/mitral regurgitation. L/RHC on 06/11/21 showed native vessel CAD with a total occlusion of her RCA, patent LAD, patent circumflex, patent 70 year old SVG to distal RCA with an ostial proximal 50 to 70% narrowing.  Medical therapy was recommended.   She was evaluated by the multidisciplinary valve team and underwent successful TAVR with a 23 mm Edwards Sapien 3 Ultra THV via the TF approach on 07/13/21. Post operative echo showed EF 65%, normally functioning TAVR with a mean gradient of 20 mmHg and no PVL. She was continued on Asprin and started on Plaivx 52m daily. She has done well in follow up.   Today the patient presents to clinic for follow up. No CP or SOB. No LE edema, orthopnea or  PND. No dizziness or syncope. No blood in stool or urine. No palpitations. Does have some fatigue after work.   Past Medical History:  Diagnosis Date   Abnormal finding on Pap smear, ASCUS    Anemia    Asthma    Chondromalacia of knee    Coronary artery disease    Diabetes mellitus without complication ([place]    Diverticulum 12/11/2002   in cecum   Fibroids    h/o   GERD (gastroesophageal reflux disease)    H/O tinea cruris 02/28/2010   History of CVA (cerebrovascular accident)    Hypercholesteremia    Hypertension    Obesity    Pneumonia 07/01/2008   S/P TAVR (transcatheter aortic valve replacement) 07/13/2021   s/p TAVR w/ a 243mEdwards S3U via the TF approach by Dr. CoBurt Knacknd Dr. BaCyndia Bent  Synovitis of knee    Systemic lupus erythematosus ([facility]   with nephritis   Vulvitis    h/o    Past Surgical History:  Procedure Laterality Date   CESAREAN SECTION     CORONARY ARTERY BYPASS GRAFT     Endometrial curettage  03/29/2000   INTRAOPERATIVE TRANSTHORACIC ECHOCARDIOGRAM Left 07/13/2021   Procedure: INTRAOPERATIVE TRANSTHORACIC ECHOCARDIOGRAM;  Surgeon: Sherren Mocha, MD;  Location: [place];  Service: Open Heart Surgery;  Laterality: Left;   RIGHT/LEFT HEART CATH AND CORONARY/GRAFT ANGIOGRAPHY N/A 06/11/2021   Procedure: RIGHT/LEFT HEART CATH AND CORONARY/GRAFT ANGIOGRAPHY;  Surgeon: Belva Crome, MD;  Location: [place] [facility];  Service: Cardiovascular;  Laterality: N/A;   ROTATOR CUFF REPAIR     TRANSCATHETER AORTIC VALVE REPLACEMENT, TRANSFEMORAL N/A 07/13/2021   Procedure: TRANSCATHETER AORTIC VALVE REPLACEMENT, TRANSFEMORAL;  Surgeon: Sherren Mocha, MD;  Location: [place];  Service: Open Heart Surgery;  Laterality: N/A;   TUBAL LIGATION     ULTRASOUND GUIDANCE FOR VASCULAR ACCESS Bilateral 07/13/2021   Procedure: ULTRASOUND GUIDANCE FOR VASCULAR ACCESS;  Surgeon: Sherren Mocha, MD;  Location: [place];  Service: Open Heart Surgery;  Laterality: Bilateral;   UPPER  GASTROINTESTINAL ENDOSCOPY  12/11/2002    Current Medications: Current Meds  Medication Sig   acetaminophen (TYLENOL) 500 MG tablet Take 1,000 mg by mouth every 6 (six) hours as needed for mild pain.   ADVAIR DISKUS 250-50 MCG/DOSE AEPB INHALE 1 PUFF INTO THE LUNGS IN THE MORNING AND AT BEDTIME.   albuterol (VENTOLIN HFA) 108 (90 Base) MCG/ACT inhaler INHALE 2 PUFFS BY MOUTH EVERY 4-6 HOURS AS NEEDED FOR COUGH OR WHEEZE   ALPRAZolam (XANAX) 0.25 MG tablet Take 1 tablet (0.25 mg total) by mouth 2 (two) times daily as needed for anxiety   aspirin 81 MG tablet Take 81 mg by mouth daily.   azithromycin (ZITHROMAX) 500 MG tablet Take one tablet by mouth ONE HOUR before any dental procedures   beclomethasone (QVAR REDIHALER) 80 MCG/ACT inhaler Use 3 puffs 3 times daily during asthma flares.   Blood Glucose Monitoring Suppl (FREESTYLE LITE) w/Device KIT USE TO TEST TWO TIMES DAILY PRIOR TO MEALS   cetirizine (ZYRTEC) 10 MG tablet Take 10 mg by mouth daily.   ciclopirox (PENLAC) 8 % solution APPLY TO THE AFFECTED AREA(S) AT BEDTIME AND EVERY 7 DAYS CLEAN NAILS WITH ALCOHOL   clopidogrel (PLAVIX) 75 MG tablet Take 1 tablet (75 mg total) by mouth daily.   dapagliflozin propanediol (FARXIGA) 10 MG TABS tablet Take 1 tablet by mouth once a day   Difluprednate (DUREZOL) 0.05 % EMUL Start 2 days prior to surgery. use 1 drop in right eye 4 times daily. use as directed after surgery   Estradiol 10 MCG TABS vaginal tablet INSERT 1 TABLET TWICE A WEEK VAGINALLY AS DIRECTED   ezetimibe (ZETIA) 10 MG tablet Take 1 tablet (10 mg total) by mouth daily.   famotidine (PEPCID) 40 MG tablet TAKE ONE TABLET BY MOUTH ONCE DAILY. INCREASE TO ONE TABLET TWICE DAILY DURING FLARE-UP.   ferrous sulfate 325 (65 FE) MG tablet Take 325 mg by mouth daily.   fluticasone (FLONASE) 50 MCG/ACT nasal spray INSTILL 1 TO 2 SPRAYS INTO EACH NOSTRIL DAILY FOR STUFFY NOSE OR DRAINAGE   glucose blood test strip USE WHEN CHECKING BLOOD SUGAR  TWICE DAILY PRIOR TO MEALS   Lancets (FREESTYLE) lancets USE TO TEST TWO TIMES DAILY PRIOR TO MEALS   magnesium gluconate (MAGONATE) 500 MG tablet Take 500 mg by mouth daily as needed (cramps).   metFORMIN (GLUCOPHAGE-XR) 500 MG 24 hr tablet TAKE 1 TABLET BY MOUTH TWICE DAILY WITH A MEAL   metoprolol succinate (TOPROL-XL) 25 MG 24 hr tablet TAKE 1 TABLET BY MOUTH TWICE DAILY   moxifloxacin (VIGAMOX) 0.5 % ophthalmic solution instil 1 drop in right eye 4 times a day starting 2 days prior to surgery &  2 drops morning of surgery   Multiple Vitamins-Minerals (MULTIVITAL PO) Take 1 tablet by mouth daily.   nitroGLYCERIN (NITROSTAT) 0.4 MG SL tablet Place 1 tablet (0.4 mg total) under the tongue every 5 (five) minutes as needed for up to 25 days for chest pain.   Probiotic Product (PROBIOTIC DAILY PO) Take 1 capsule by mouth at bedtime. Ultra Flora IB   rosuvastatin (CRESTOR) 40 MG tablet Take 1 tablet by mouth daily. Please keep upcoming appt for refills.   TRUE METRIX BLOOD GLUCOSE TEST test strip    TRUEPLUS LANCETS 30G MISC    valACYclovir (VALTREX) 500 MG tablet TAKE 1 TABLET BY MOUTH TWICE DAILY AS NEEDED   vitamin C (ASCORBIC ACID) 500 MG tablet Take 500 mg by mouth daily.   Vitamin D-Vitamin K (VITAMIN K2-VITAMIN D3 PO) Take 1 tablet by mouth every other day.   zinc gluconate 50 MG tablet Take 50 mg by mouth daily.     Allergies:   Biaxin [clarithromycin], Cardiolite [technetium-36m, Ciprofloxacin, Erythromycin base, Montelukast sodium, Niaspan [niacin er], Penicillins, Prilosec [omeprazole], Singulair [montelukast], Ticlid [ticlopidine hcl], and Iodinated diagnostic agents   Social History   Socioeconomic History   Marital status: Married    Spouse name: BAlvester Chou  Number of children: 4   Years of education: 12+   Highest education level: Not on file  Occupational History   Occupation: CARE MANAGEMENT ASST    Employer: [employer] Tobacco Use   Smoking status: Former    Smokeless tobacco: Never  Substance and Sexual Activity   Alcohol use: Yes    Comment: occasional   Drug use: No   Sexual activity: Not on file  Other Topics Concern   Not on file  Social History Narrative   Lives with her husband. Their children are grown, 1 [place]  1 [place] and one in [place]   Right handed   Social Determinants of Health   Financial Resource Strain: Not on file  Food Insecurity: Not on file  Transportation Needs: Not on file  Physical Activity: Not on file  Stress: Not on file  Social Connections: Not on file     Family History: The patient's family history includes Breast cancer (age of onset: 712 in her mother; Diabetes in her father and mother; Heart attack in her paternal uncle; Hyperlipidemia in her father; Ovarian cancer in her cousin; Prostate cancer in her father; Stroke in her mother.  ROS:   Please see the history of present illness.    All other systems reviewed and are negative.  EKGs/Labs/Other Studies Reviewed:    The following studies were reviewed today:  TAVR OPERATIVE NOTE     Date of Procedure:                07/13/2021   Preoperative Diagnosis:      Severe Aortic Stenosis    Postoperative Diagnosis:    Same    Procedure:        Transcatheter Aortic Valve Replacement - Percutaneous Right Transfemoral Approach             Edwards Sapien 3 Ultra THV (size 23 mm, model # 9750TFX, serial # [id number]              Co-Surgeons:                        BGaye Pollack MD and MSherren Mocha MD  Anesthesiologist:                  Suella Broad, MD   Echocardiographer:              Sanda Klein, MD   Pre-operative Echo Findings: Severe aortic stenosis Normal left ventricular systolic function   Post-operative Echo Findings: No paravalvular leak Normal left ventricular systolic function _____________     Echo 07/14/21: IMPRESSIONS  1. Left ventricular ejection  fraction, by estimation, is 65 to 70%. The  left ventricle has normal function. The left ventricle has no regional  wall motion abnormalities. There is mild concentric left ventricular  hypertrophy. Left ventricular diastolic  parameters are consistent with Grade I diastolic dysfunction (impaired  relaxation). Elevated left atrial pressure.   2. Right ventricular systolic function is normal. The right ventricular  size is normal. Tricuspid regurgitation signal is inadequate for assessing  PA pressure.   3. Left atrial size was moderately dilated.   4. The mitral valve is normal in structure. Mild mitral valve  regurgitation. The mean mitral valve gradient is 3.0 mmHg with average  heart rate of 66 bpm. Moderate to severe mitral annular calcification.   5. The aortic valve has been repaired/replaced. Aortic valve  regurgitation is not visualized. There is a 23 mm Edwards Ultra, stented  (TAVR) valve present in the aortic position. Procedure Date: 07/13/2021.  Aortic valve area, by VTI measures 1.45 cm.  Aortic valve mean gradient measures 20.0 mmHg. Aortic valve Vmax measures  2.90 m/s. Aortic valve acceleration time measures 81 msec.   6. The inferior vena cava is normal in size with greater than 50%  respiratory variability, suggesting right atrial pressure of 3 mmHg.  _______________________  Echo 08/05/21 IMPRESSIONS  1. Left ventricular ejection fraction, by estimation, is 60 to 65%. The  left ventricle has normal function. The left ventricle has no regional  wall motion abnormalities. There is mild concentric left ventricular  hypertrophy. Left ventricular diastolic  parameters are consistent with Grade I diastolic dysfunction (impaired  relaxation).   2. Right ventricular systolic function is normal. The right ventricular  size is normal. There is normal pulmonary artery systolic pressure.   3. The mitral valve is degenerative. Mild mitral valve regurgitation. No  evidence of  mitral stenosis. The mean mitral valve gradient is 3.0 mmHg.  Severe mitral annular calcification.   4. The aortic valve has been repaired/replaced. Aortic valve  regurgitation is not visualized. No aortic stenosis is present. There is a  23 mm Sapien prosthetic (TAVR) valve present in the aortic position.  Procedure Date: 07/13/2021. Aortic valve mean  gradient measures 17.0 mmHg. DVI 0.32.   5. The inferior vena cava is normal in size with greater than 50%  respiratory variability, suggesting right atrial pressure of 3 mmHg.   6. Compared to prior echo, mean TAVR gradient has decreased from 20 to  53mHg.    EKG:  EKG is NOT ordered today.  Recent Labs: 07/09/2021: ALT 23 07/14/2021: BUN 12; Creatinine, Ser 0.66; Hemoglobin 10.4; Platelets 216; Potassium 3.9; Sodium 140  Recent Lipid Panel    Component Value Date/Time   CHOL 120 03/11/2014 0841   TRIG 65.0 03/11/2014 0841   HDL 41.00 03/11/2014 0841   CHOLHDL 3 03/11/2014 0841   VLDL 13.0 03/11/2014 0841   LDLCALC 66 03/11/2014 0841     Risk Assessment/Calculations:       Physical Exam:    VS:  BP 132/72   Pulse 75  Ht 5' 5"  (1.651 m)   Wt 215 lb (97.5 kg)   SpO2 97%   BMI 35.78 kg/m     Wt Readings from Last 3 Encounters:  08/05/21 215 lb (97.5 kg)  07/21/21 211 lb 6.4 oz (95.9 kg)  07/14/21 211 lb 13.8 oz (96.1 kg)     GEN:  Well nourished, well developed in no acute distress HEENT: Normal NECK: No JVD LYMPHATICS: No lymphadenopathy CARDIAC: RRR, soft flow murmur. No rubs, gallops RESPIRATORY:  Clear to auscultation without rales, wheezing or rhonchi  ABDOMEN: Soft, non-tender, non-distended MUSCULOSKELETAL:  No edema; No deformity  SKIN: Warm and dry.   NEUROLOGIC:  Alert and oriented x 3 PSYCHIATRIC:  Normal affect   ASSESSMENT:    1. S/P TAVR (transcatheter aortic valve replacement)   2. Coronary artery disease of bypass graft of native heart with stable angina pectoris ([place])   3. History of CVA  (cerebrovascular accident)   4. Osteoarthritis of knee, unspecified laterality, unspecified osteoarthritis type     PLAN:    In order of problems listed above:  Severe AS s/p TAVR: echo today shows EF 60%, normally functioning TAVR with a mean gradient of 17 mm hg and no PVL. She has NYHA class I symptoms. SBE prophylaxis discussed; I have RX'd azithromycin due to a PCN allergy. Continue on aspirin and Plavix. Plavix can be discontinued after 6 months of therapy (12/2021). I will see her back in 1 year for follow up with echo.    CAD s/p CABG: pre TAVR L/RHC on 06/11/21 showed native vessel CAD with a total occlusion of her RCA, patent LAD, patent circumflex, patent 70 year old SVG to distal RCA with an ostial proximal 50 to 70% narrowing. Continue medical therapy.    Hx of CVA: continue aspirin and statin.  OA: will eventually nee surgery for this but wants to wait until after doing an eye surgery, which she is cleared for from a cardiac standpoint.    Cardiac Rehabilitation Eligibility Assessment: The patient is ready to start Cardiac Rehab from a cardiac standpoint.    Medication Adjustments/Labs and Tests Ordered: Current medicines are reviewed at length with the patient today.  Concerns regarding medicines are outlined above.  No orders of the defined types were placed in this encounter.   No orders of the defined types were placed in this encounter.   There are no Patient Instructions on file for this visit.   Signed, Angelena Form, PA-C  08/05/2021 4:24 PM    [facility]

## 2021-08-05 NOTE — Patient Instructions (Signed)
Medication Instructions:  Your physician has recommended you make the following change in your medication:  STOP PLAVIX IN MARCH 2023, WHEN YOU PILLS RUN OUT  *If you need a refill on your cardiac medications before your next appointment, please call your pharmacy*   Lab Work: NONE If you have labs (blood work) drawn today and your tests are completely normal, you will receive your results only by: [place] (if you have MyChart) OR A paper copy in the mail If you have any lab test that is abnormal or we need to change your treatment, we will call you to review the results.   Testing/Procedures: NONE   Follow-Up: At [facility], you and your health needs are our priority.  As part of our continuing mission to provide you with exceptional heart care, we have created designated Provider Care Teams.  These Care Teams include your primary Cardiologist (physician) and Advanced Practice Providers (APPs -  Physician Assistants and Nurse Practitioners) who all work together to provide you with the care you need, when you need it.  We recommend signing up for the patient portal called "MyChart".  Sign up information is provided on this After Visit Summary.  MyChart is used to connect with patients for Virtual Visits (Telemedicine).  Patients are able to view lab/test results, encounter notes, upcoming appointments, etc.  Non-urgent messages can be sent to your provider as well.   To learn more about what you can do with MyChart, go to [email or web address].    Your next appointment:   6 month(s)  The format for your next appointment:   In Person  Provider:   Daneen Schick, MD  Your next appointment:   1 year(s)  The format for your next appointment:   In Person  Provider:   Nell Range, PA-C

## 2021-08-06 ENCOUNTER — Other Ambulatory Visit (HOSPITAL_COMMUNITY): Payer: Self-pay

## 2021-08-06 MED ORDER — PREDNISOLONE ACETATE 1 % OP SUSP
OPHTHALMIC | 1 refills | Status: DC
  Filled 2021-08-06: qty 10, 40d supply, fill #0

## 2021-08-09 ENCOUNTER — Other Ambulatory Visit: Payer: Self-pay

## 2021-08-09 ENCOUNTER — Other Ambulatory Visit (HOSPITAL_COMMUNITY): Payer: Self-pay

## 2021-08-09 MED ORDER — FLUTICASONE-SALMETEROL 250-50 MCG/ACT IN AEPB
INHALATION_SPRAY | RESPIRATORY_TRACT | 0 refills | Status: DC
  Filled 2021-08-09: qty 60, 30d supply, fill #0

## 2021-08-09 NOTE — Telephone Encounter (Signed)
Patient called in regards to getting her Advair refilled. She last got a courtesy refill in August. The earliest we could get her in for an appointment was November. I sent in a 30 day courtesy refill patient understands to keep her appointment for further refills.

## 2021-08-11 ENCOUNTER — Other Ambulatory Visit (HOSPITAL_COMMUNITY): Payer: Self-pay

## 2021-08-11 MED ORDER — MOXIFLOXACIN HCL 0.5 % OP SOLN
OPHTHALMIC | 1 refills | Status: DC
  Filled 2021-08-11: qty 3, 12d supply, fill #0

## 2021-08-11 MED ORDER — PREDNISOLONE ACETATE 1 % OP SUSP: OPHTHALMIC | 1 refills | Status: DC

## 2021-08-16 ENCOUNTER — Telehealth: Payer: Self-pay | Admitting: Interventional Cardiology

## 2021-08-16 NOTE — Telephone Encounter (Signed)
Forms from Holli Humbles received on 07/21/2021. Completed patient auth attached. Took form to Dr. Tamala Julian box for completion. 08/16/2021 JMM

## 2021-08-18 ENCOUNTER — Other Ambulatory Visit (HOSPITAL_COMMUNITY): Payer: Self-pay

## 2021-08-19 ENCOUNTER — Telehealth: Payer: Self-pay | Admitting: Interventional Cardiology

## 2021-08-19 NOTE — Telephone Encounter (Signed)
Form completed by Dr. Tamala Julian and faxed to [employer] on 08/19/2021. Patient has been notified to pick up paperwork. 08/19/2021 JMM

## 2021-08-19 NOTE — Telephone Encounter (Signed)
From complete by Dr. Tamala Julian and faxed to [employer] on 08/19/2021. Patient notified and ready for pick up. 08/19/2021 JMM

## 2021-08-24 DIAGNOSIS — H2512 Age-related nuclear cataract, left eye: Secondary | ICD-10-CM | POA: Diagnosis not present

## 2021-08-24 DIAGNOSIS — H25012 Cortical age-related cataract, left eye: Secondary | ICD-10-CM | POA: Diagnosis not present

## 2021-08-24 DIAGNOSIS — H25812 Combined forms of age-related cataract, left eye: Secondary | ICD-10-CM | POA: Diagnosis not present

## 2021-08-26 DIAGNOSIS — Z23 Encounter for immunization: Secondary | ICD-10-CM | POA: Diagnosis not present

## 2021-08-27 ENCOUNTER — Telehealth (HOSPITAL_COMMUNITY): Payer: Self-pay

## 2021-08-27 NOTE — Telephone Encounter (Signed)
Pt is unable to participate in the cardiac rehab at this time. Closed referral

## 2021-08-30 ENCOUNTER — Other Ambulatory Visit (HOSPITAL_COMMUNITY): Payer: Self-pay

## 2021-08-30 MED FILL — Metoprolol Succinate Tab ER 24HR 25 MG (Tartrate Equiv): ORAL | 90 days supply | Qty: 180 | Fill #1 | Status: AC

## 2021-08-31 ENCOUNTER — Other Ambulatory Visit (HOSPITAL_COMMUNITY): Payer: Self-pay

## 2021-08-31 ENCOUNTER — Other Ambulatory Visit: Payer: Self-pay

## 2021-08-31 ENCOUNTER — Ambulatory Visit: Payer: 59 | Admitting: Allergy and Immunology

## 2021-08-31 VITALS — BP 120/68 | HR 74 | Temp 97.9°F | Resp 18 | Ht 65.5 in | Wt 216.6 lb

## 2021-08-31 DIAGNOSIS — K219 Gastro-esophageal reflux disease without esophagitis: Secondary | ICD-10-CM | POA: Diagnosis not present

## 2021-08-31 DIAGNOSIS — J302 Other seasonal allergic rhinitis: Secondary | ICD-10-CM

## 2021-08-31 DIAGNOSIS — J4540 Moderate persistent asthma, uncomplicated: Secondary | ICD-10-CM

## 2021-08-31 DIAGNOSIS — J3089 Other allergic rhinitis: Secondary | ICD-10-CM | POA: Diagnosis not present

## 2021-08-31 MED ORDER — CETIRIZINE HCL 10 MG PO TABS
10.0000 mg | ORAL_TABLET | Freq: Two times a day (BID) | ORAL | 5 refills | Status: DC | PRN
  Filled 2021-08-31: qty 60, 30d supply, fill #0

## 2021-08-31 MED ORDER — FAMOTIDINE 40 MG PO TABS
40.0000 mg | ORAL_TABLET | Freq: Two times a day (BID) | ORAL | 5 refills | Status: DC | PRN
  Filled 2021-08-31: qty 60, 30d supply, fill #0
  Filled 2021-10-08: qty 60, 30d supply, fill #1
  Filled 2022-01-11: qty 60, 30d supply, fill #2

## 2021-08-31 MED ORDER — ALBUTEROL SULFATE HFA 108 (90 BASE) MCG/ACT IN AERS
INHALATION_SPRAY | RESPIRATORY_TRACT | 1 refills | Status: DC
  Filled 2021-08-31: qty 18, 16d supply, fill #0

## 2021-08-31 MED ORDER — QVAR REDIHALER 80 MCG/ACT IN AERB
INHALATION_SPRAY | RESPIRATORY_TRACT | 5 refills | Status: DC
  Filled 2021-08-31 – 2022-03-01 (×3): qty 10.6, 30d supply, fill #0
  Filled 2022-03-03: qty 10.6, 20d supply, fill #0

## 2021-08-31 MED ORDER — FLUTICASONE-SALMETEROL 250-50 MCG/ACT IN AEPB
1.0000 | INHALATION_SPRAY | Freq: Two times a day (BID) | RESPIRATORY_TRACT | 5 refills | Status: DC
  Filled 2021-08-31: qty 60, 30d supply, fill #0
  Filled 2021-12-06: qty 60, 30d supply, fill #1
  Filled 2022-02-26: qty 60, 30d supply, fill #2

## 2021-08-31 MED ORDER — FLUTICASONE PROPIONATE 50 MCG/ACT NA SUSP
2.0000 | Freq: Every day | NASAL | 1 refills | Status: DC
  Filled 2021-08-31: qty 48, 90d supply, fill #0
  Filled 2021-12-06: qty 48, 90d supply, fill #1

## 2021-08-31 NOTE — Patient Instructions (Signed)
  1.  Continue Advair 250 1 inhalation 1-2 times per day depending on disease activity  2.  Continue Flonase 1 spray each nostril 3-7 times per week depending on disease activity  3. Continue Famotidine 40 mg tablet 1 time per day  4.  "Action plan" for asthma flare:   A. Add Qvar 80 REDIHALER 3 inhalations 3 times per day  B. use Proventil HFA 2 puffs every 4-6 hours if needed  C. Increase famotidine to 40 mg 2 times per day  5. Zyrtec 10 mg one tablet one time per day if needed  6. Return to clinic in 6 months or earlier if problem

## 2021-08-31 NOTE — Progress Notes (Signed)
 [place] - [place] - [place] - [place] - [place]   Follow-up Note  Referring Provider: Gates, Robert, MD Primary Provider: Gates, Robert, MD Date of Office Visit: 08/31/2021  Subjective:   Deanna Simmons (DOB: 12/28/1950) is a 70 y.o. female who [facility] in re-evaluation of the following:  HPI: Ama returns to this clinic in evaluation of asthma and allergic rhinitis and LPR.  Her last visit to this clinic was 21 October 2020.  Her asthma has not been a particularly big issue while using Advair mostly 1 time per day.  She has not required a systemic steroid to treat an exacerbation and she rarely uses any short acting bronchodilator.  She is not able to exercise because of a knee issue.  Her nose has been doing very well while intermittently using some nasal steroid.  It does not sound as though she has required an antibiotic to treat an episode of sinusitis.  Her reflux is under excellent control on her famotidine.  She apparently had some unsteady gait which led to evaluation for possible TIA which led to the conclusion that her aortic stenosis and regurgitation was severe and she underwent a successful transcatheter aortic valve replacement on 13 July 2020 and is now using Plavix and aspirin.  She has also had bilateral cataract removal.  She is planning for a left knee replacement at some point next year.  She has received 3 Pfizer COVID vaccines and the flu vaccine this year.  Allergies as of 08/31/2021       Reactions   Biaxin [clarithromycin] Itching   Cardiolite [technetium-99m] Itching   Ciprofloxacin Itching   Erythromycin Base Itching   Montelukast Sodium Itching   Niaspan [niacin Er] Other (See Comments)   flushing   Penicillins Itching   Reaction: over 10 years   Prilosec [omeprazole] Itching   Singulair [montelukast] Hives, Itching   Ticlid [ticlopidine Hcl] Itching   Iodinated Diagnostic Agents  Itching   Pt has a potential contrast allergy; She states in 2012 her cardiologist told her she had a reaction during the "study". We are unable to determine the study or confirm a reaction. The patient will have a 13 hr prep for future studies. Molecular Imaging states their injection does not cause any reactions.        Medication List    acetaminophen 500 MG tablet Commonly known as: TYLENOL Take 1,000 mg by mouth every 6 (six) hours as needed for mild pain.   Advair Diskus 250-50 MCG/ACT Aepb Generic drug: fluticasone-salmeterol Inhale 1 puff into the lungs 1-2 times per day depending on disease activity (must keep appointment for refills)   albuterol 108 (90 Base) MCG/ACT inhaler Commonly known as: Ventolin HFA INHALE 2 PUFFS BY MOUTH EVERY 4-6 HOURS AS NEEDED FOR COUGH OR WHEEZE   ALPRAZolam 0.25 MG tablet Commonly known as: XANAX Take 1 tablet (0.25 mg total) by mouth 2 (two) times daily as needed for anxiety   aspirin 81 MG tablet Take 81 mg by mouth daily.   cetirizine 10 MG tablet Commonly known as: ZYRTEC Take 10 mg by mouth daily.   clopidogrel 75 MG tablet Commonly known as: PLAVIX Take 1 tablet (75 mg total) by mouth daily.   Estradiol 10 MCG Tabs vaginal tablet INSERT 1 TABLET TWICE A WEEK VAGINALLY AS DIRECTED   ezetimibe 10 MG tablet Commonly known as: ZETIA Take 1 tablet (10 mg total) by mouth daily.   famotidine   40 MG tablet Commonly known as: PEPCID TAKE ONE TABLET BY MOUTH ONCE DAILY. INCREASE TO ONE TABLET TWICE DAILY DURING FLARE-UP.   Farxiga 10 MG Tabs tablet Generic drug: dapagliflozin propanediol Take 1 tablet by mouth once a day (Take 1 tablet by mouth once a day)   ferrous sulfate 325 (65 FE) MG tablet Take 325 mg by mouth daily.   fluticasone 50 MCG/ACT nasal spray Commonly known as: FLONASE INSTILL 1 TO 2 SPRAYS INTO EACH NOSTRIL DAILY FOR STUFFY NOSE OR DRAINAGE   FreeStyle Lite w/Device Kit USE TO TEST TWO TIMES DAILY PRIOR  TO MEALS   magnesium gluconate 500 MG tablet Commonly known as: MAGONATE Take 500 mg by mouth daily as needed (cramps).   metoprolol succinate 25 MG 24 hr tablet Commonly known as: TOPROL-XL TAKE 1 TABLET BY MOUTH TWICE DAILY   moxifloxacin 0.5 % ophthalmic solution Commonly known as: VIGAMOX Place 1 drop in left eye 4 times a day starting 2 days prior to surgery and 2 drops morning of surgery   MULTIVITAL PO Take 1 tablet by mouth daily.   nitroGLYCERIN 0.4 MG SL tablet Commonly known as: NITROSTAT Place 1 tablet (0.4 mg total) under the tongue every 5 (five) minutes as needed for up to 25 days for chest pain.   prednisoLONE acetate 1 % ophthalmic suspension Commonly known as: PRED FORTE Place 1 drop into the affected eye 2 times a day   PROBIOTIC DAILY PO Take 1 capsule by mouth at bedtime. Ultra Flora IB   Qvar RediHaler 80 MCG/ACT inhaler Generic drug: beclomethasone Use 3 puffs 3 times daily during asthma flares.   rosuvastatin 40 MG tablet Commonly known as: CRESTOR Take 1 tablet by mouth daily. Please keep upcoming appt for refills.   True Metrix Blood Glucose Test test strip Generic drug: glucose blood   Accu-Chek Guide test strip Generic drug: glucose blood USE WHEN CHECKING BLOOD SUGAR TWICE DAILY PRIOR TO MEALS   TRUEplus Lancets 30G Misc   freestyle lancets USE TO TEST TWO TIMES DAILY PRIOR TO MEALS   valACYclovir 500 MG tablet Commonly known as: VALTREX TAKE 1 TABLET BY MOUTH TWICE DAILY AS NEEDED   vitamin C 500 MG tablet Commonly known as: ASCORBIC ACID Take 500 mg by mouth daily.   VITAMIN K2-VITAMIN D3 PO Take 1 tablet by mouth every other day.   zinc gluconate 50 MG tablet Take 50 mg by mouth daily.    Past Medical History:  Diagnosis Date   Abnormal finding on Pap smear, ASCUS    Anemia    Asthma    Chondromalacia of knee    Coronary artery disease    Diabetes mellitus without complication ([place])    Diverticulum 12/11/2002   in  cecum   Fibroids    h/o   GERD (gastroesophageal reflux disease)    H/O tinea cruris 02/28/2010   History of CVA (cerebrovascular accident)    Hypercholesteremia    Hypertension    Obesity    Pneumonia 07/01/2008   S/P TAVR (transcatheter aortic valve replacement) 07/13/2021   s/p TAVR w/ a 43m Edwards S3U via the TF approach by Dr. CBurt Knackand Dr. BCyndia Bent   Synovitis of knee    Systemic lupus erythematosus ([place]    with nephritis   Vulvitis    h/o    Past Surgical History:  Procedure Laterality Date   CESAREAN SECTION     CORONARY ARTERY BYPASS GRAFT     Endometrial curettage  03/29/2000  INTRAOPERATIVE TRANSTHORACIC ECHOCARDIOGRAM Left 07/13/2021   Procedure: INTRAOPERATIVE TRANSTHORACIC ECHOCARDIOGRAM;  Surgeon: Cooper, Michael, MD;  Location: [facility] OR;  Service: Open Heart Surgery;  Laterality: Left;   RIGHT/LEFT HEART CATH AND CORONARY/GRAFT ANGIOGRAPHY N/A 06/11/2021   Procedure: RIGHT/LEFT HEART CATH AND CORONARY/GRAFT ANGIOGRAPHY;  Surgeon: Smith, Henry W, MD;  Location: [facility] INVASIVE [facility];  Service: Cardiovascular;  Laterality: N/A;   ROTATOR CUFF REPAIR     TRANSCATHETER AORTIC VALVE REPLACEMENT, TRANSFEMORAL N/A 07/13/2021   Procedure: TRANSCATHETER AORTIC VALVE REPLACEMENT, TRANSFEMORAL;  Surgeon: Cooper, Michael, MD;  Location: [facility] OR;  Service: Open Heart Surgery;  Laterality: N/A;   TUBAL LIGATION     ULTRASOUND GUIDANCE FOR VASCULAR ACCESS Bilateral 07/13/2021   Procedure: ULTRASOUND GUIDANCE FOR VASCULAR ACCESS;  Surgeon: Cooper, Michael, MD;  Location: [facility] OR;  Service: Open Heart Surgery;  Laterality: Bilateral;   UPPER GASTROINTESTINAL ENDOSCOPY  12/11/2002    Review of systems negative except as noted in HPI / PMHx or noted below:  Review of Systems  Constitutional: Negative.   HENT: Negative.    Eyes: Negative.   Respiratory: Negative.    Cardiovascular: Negative.   Gastrointestinal: Negative.   Genitourinary: Negative.   Musculoskeletal: Negative.   Skin:  Negative.   Neurological: Negative.   Endo/Heme/Allergies: Negative.   Psychiatric/Behavioral: Negative.      Objective:   Vitals:   08/31/21 1651  BP: 120/68  Pulse: 74  Resp: 18  Temp: 97.9 F (36.6 C)  SpO2: 97%   Height: 5' 5.5" (166.4 cm)  Weight: 216 lb 9.6 oz (98.2 kg)   Physical Exam Constitutional:      Appearance: She is not diaphoretic.  HENT:     Head: Normocephalic.     Right Ear: Tympanic membrane, ear canal and external ear normal.     Left Ear: Tympanic membrane, ear canal and external ear normal.     Nose: Nose normal. No mucosal edema or rhinorrhea.     Mouth/Throat:     Pharynx: Uvula midline. No oropharyngeal exudate.  Eyes:     Conjunctiva/sclera: Conjunctivae normal.  Neck:     Thyroid: No thyromegaly.     Trachea: Trachea normal. No tracheal tenderness or tracheal deviation.  Cardiovascular:     Rate and Rhythm: Normal rate and regular rhythm.     Heart sounds: Normal heart sounds, S1 normal and S2 normal. No murmur heard. Pulmonary:     Effort: No respiratory distress.     Breath sounds: Normal breath sounds. No stridor. No wheezing or rales.  Lymphadenopathy:     Head:     Right side of head: No tonsillar adenopathy.     Left side of head: No tonsillar adenopathy.     Cervical: No cervical adenopathy.  Skin:    Findings: No erythema or rash.     Nails: There is no clubbing.  Neurological:     Mental Status: She is alert.    Diagnostics:    Spirometry was performed and demonstrated an FEV1 of 1.80 at 93 % of predicted.  Assessment and Plan:   1. Asthma, moderate persistent, well-controlled   2. Seasonal and perennial allergic rhinitis   3. LPRD (laryngopharyngeal reflux disease)     1.  Continue Advair 250 1 inhalation 1-2 times per day depending on disease activity  2.  Continue Flonase 1 spray each nostril 3-7 times per week depending on disease activity  3. Continue Famotidine 40 mg tablet 1 time per day  4.  "Action    plan" for asthma flare:   A. Add Qvar 80 REDIHALER 3 inhalations 3 times per day  B. use Proventil HFA 2 puffs every 4-6 hours if needed  C. Increase famotidine to 40 mg 2 times per day  5. Zyrtec 10 mg one tablet one time per day if needed  6. Return to clinic in 6 months or earlier if problem  Ariabella appears to be doing pretty well at this point in time regarding her respiratory tract issue and her reflux issue.  She has a very good understanding of her disease state and how her medications work and appropriate dosing of her medications depending on disease activity.  We will continue her on the plan noted above which includes a collection of anti-inflammatory agents for her airway and H2 receptor blocker for her reflux and as long as she continues to do well with this plan I will see her back in this clinic in 6 months or earlier if there is a problem.    Eric Kozlow, MD Allergy / Immunology [facility]  

## 2021-09-01 ENCOUNTER — Other Ambulatory Visit (HOSPITAL_COMMUNITY): Payer: Self-pay

## 2021-09-01 ENCOUNTER — Encounter: Payer: Self-pay | Admitting: Allergy and Immunology

## 2021-09-02 ENCOUNTER — Other Ambulatory Visit (HOSPITAL_COMMUNITY): Payer: Self-pay

## 2021-09-03 NOTE — Telephone Encounter (Signed)
Pt called back and stated that she is interested in the 3:00pm class for cardiac rehab. I advised pt that I will open her referral back up and will give her a call once the December schedule has been complete. Also I advised pt if we had any cancellations that I would give her a call.

## 2021-09-08 DIAGNOSIS — E785 Hyperlipidemia, unspecified: Secondary | ICD-10-CM | POA: Diagnosis not present

## 2021-09-08 DIAGNOSIS — R42 Dizziness and giddiness: Secondary | ICD-10-CM | POA: Diagnosis not present

## 2021-09-08 DIAGNOSIS — I2510 Atherosclerotic heart disease of native coronary artery without angina pectoris: Secondary | ICD-10-CM | POA: Diagnosis not present

## 2021-09-08 DIAGNOSIS — Z7984 Long term (current) use of oral hypoglycemic drugs: Secondary | ICD-10-CM | POA: Diagnosis not present

## 2021-09-08 DIAGNOSIS — Z8673 Personal history of transient ischemic attack (TIA), and cerebral infarction without residual deficits: Secondary | ICD-10-CM | POA: Diagnosis not present

## 2021-09-08 DIAGNOSIS — D509 Iron deficiency anemia, unspecified: Secondary | ICD-10-CM | POA: Diagnosis not present

## 2021-09-08 DIAGNOSIS — G4733 Obstructive sleep apnea (adult) (pediatric): Secondary | ICD-10-CM | POA: Diagnosis not present

## 2021-09-08 DIAGNOSIS — I350 Nonrheumatic aortic (valve) stenosis: Secondary | ICD-10-CM | POA: Diagnosis not present

## 2021-09-08 DIAGNOSIS — E119 Type 2 diabetes mellitus without complications: Secondary | ICD-10-CM | POA: Diagnosis not present

## 2021-09-08 DIAGNOSIS — Z952 Presence of prosthetic heart valve: Secondary | ICD-10-CM | POA: Diagnosis not present

## 2021-09-09 NOTE — Telephone Encounter (Signed)
Called patient to see if she was interested in participating in the Cardiac Rehab Program. Patient stated yes. Patient will come in for orientation on 09/28/2021@8 :30am and will attend the 3:00pm exercise class.   Tourist information centre manager.

## 2021-09-10 ENCOUNTER — Other Ambulatory Visit (HOSPITAL_COMMUNITY): Payer: Self-pay

## 2021-09-13 ENCOUNTER — Other Ambulatory Visit (HOSPITAL_COMMUNITY): Payer: Self-pay

## 2021-09-13 DIAGNOSIS — R350 Frequency of micturition: Secondary | ICD-10-CM | POA: Diagnosis not present

## 2021-09-13 DIAGNOSIS — B356 Tinea cruris: Secondary | ICD-10-CM | POA: Diagnosis not present

## 2021-09-13 DIAGNOSIS — N952 Postmenopausal atrophic vaginitis: Secondary | ICD-10-CM | POA: Diagnosis not present

## 2021-09-13 MED ORDER — ESTRADIOL 10 MCG VA TABS
ORAL_TABLET | VAGINAL | 3 refills | Status: DC
  Filled 2021-09-13: qty 24, 84d supply, fill #0
  Filled 2021-12-06: qty 24, 84d supply, fill #1
  Filled 2022-05-30: qty 24, 84d supply, fill #2

## 2021-09-13 MED ORDER — ESTRADIOL 10 MCG VA TABS
ORAL_TABLET | VAGINAL | 11 refills | Status: DC
  Filled 2021-09-13: qty 14, 49d supply, fill #0

## 2021-09-13 MED ORDER — CLOTRIMAZOLE 1 % EX CREA: TOPICAL_CREAM | CUTANEOUS | 1 refills | Status: DC

## 2021-09-22 ENCOUNTER — Telehealth (HOSPITAL_COMMUNITY): Payer: Self-pay

## 2021-09-27 ENCOUNTER — Telehealth (HOSPITAL_COMMUNITY): Payer: Self-pay | Admitting: *Deleted

## 2021-09-28 ENCOUNTER — Other Ambulatory Visit: Payer: Self-pay

## 2021-09-28 ENCOUNTER — Encounter (HOSPITAL_COMMUNITY): Payer: Self-pay

## 2021-09-28 ENCOUNTER — Encounter (HOSPITAL_COMMUNITY)
Admission: RE | Admit: 2021-09-28 | Discharge: 2021-09-28 | Disposition: A | Discharge status: Home / Self Care | Payer: 59 | Source: Ambulatory Visit | Attending: Interventional Cardiology | Admitting: Interventional Cardiology

## 2021-09-28 VITALS — BP 104/70 | HR 68 | Ht 63.75 in | Wt 217.8 lb

## 2021-09-28 DIAGNOSIS — Z952 Presence of prosthetic heart valve: Secondary | ICD-10-CM | POA: Insufficient documentation

## 2021-09-28 NOTE — Progress Notes (Signed)
Cardiac Individual Treatment Plan  Patient Details  Name: Deanna Simmons MRN: [id number] Date of Birth: 01/06/1951 Referring Provider:   Flowsheet Row CARDIAC REHAB PHASE II ORIENTATION from 09/28/2021 in [place]  Referring Provider Belva Crome, MD       Initial Encounter Date:  [place] PHASE II ORIENTATION from 09/28/2021 in [place]  Date 09/28/21       Visit Diagnosis: 07/13/21 TAVR  Patient's Home Medications on Admission:  Current Outpatient Medications:    acetaminophen (TYLENOL) 500 MG tablet, Take 1,000 mg by mouth every 6 (six) hours as needed for mild pain., Disp: , Rfl:    ADVAIR DISKUS 250-50 MCG/DOSE AEPB, INHALE 1 PUFF INTO THE LUNGS IN THE MORNING AND AT BEDTIME., Disp: 60 each, Rfl: 5   albuterol (VENTOLIN HFA) 108 (90 Base) MCG/ACT inhaler, INHALE 2 PUFFS BY MOUTH EVERY 4-6 HOURS AS NEEDED FOR COUGH OR WHEEZE, Disp: 18 g, Rfl: 1   ALPRAZolam (XANAX) 0.25 MG tablet, Take 1 tablet (0.25 mg total) by mouth 2 (two) times daily as needed for anxiety, Disp: 30 tablet, Rfl: 1   aspirin 81 MG tablet, Take 81 mg by mouth daily., Disp: , Rfl:    beclomethasone (QVAR REDIHALER) 80 MCG/ACT inhaler, Inhale 3 puffs 3 times daily during asthma flares. (Patient taking differently: 3 puffs 3 (three) times daily as needed (asthma flares).), Disp: 10.6 g, Rfl: 5   Blood Glucose Monitoring Suppl (FREESTYLE LITE) w/Device KIT, USE TO TEST TWO TIMES DAILY PRIOR TO MEALS, Disp: 1 kit, Rfl: 3   cetirizine (ZYRTEC) 10 MG tablet, Take 1 tablet (10 mg total) by mouth 2 (two) times daily as needed for allergies (Can use an extra dose during flares). (Patient taking differently: Take 10 mg by mouth in the morning.), Disp: 60 tablet, Rfl: 5   clopidogrel (PLAVIX) 75 MG tablet, Take 1 tablet (75 mg total) by mouth daily., Disp: 90 tablet, Rfl: 1   dapagliflozin propanediol (FARXIGA) 10 MG TABS tablet, Take 1  tablet by mouth once a day, Disp: 90 tablet, Rfl: 3   Estradiol 10 MCG TABS vaginal tablet, INSERT 1 TABLET VAGINALLY TWICE A WEEK AS DIRECTED, Disp: 14 tablet, Rfl: 11   ezetimibe (ZETIA) 10 MG tablet, Take 1 tablet (10 mg total) by mouth daily., Disp: 90 tablet, Rfl: 3   famotidine (PEPCID) 40 MG tablet, Take 1 tablet (40 mg) by mouth 2 (two) times daily as needed for heartburn or indigestion (Can use an extra dose during flares). (Patient taking differently: Take 40 mg by mouth in the morning.), Disp: 60 tablet, Rfl: 5   ferrous sulfate 325 (65 FE) MG tablet, Take 325 mg by mouth daily., Disp: , Rfl:    fluticasone (FLONASE) 50 MCG/ACT nasal spray, Place 2 sprays into both nostrils daily. (Patient taking differently: Place 2 sprays into both nostrils daily as needed for allergies.), Disp: 48 g, Rfl: 1   fluticasone-salmeterol (ADVAIR) 250-50 MCG/ACT AEPB, Inhale 1 puff into the lungs 1-2 times per day depending on disease activity (must keep appointment for refills), Disp: 60 each, Rfl: 5   glucose blood test strip, USE WHEN CHECKING BLOOD SUGAR TWICE DAILY PRIOR TO MEALS, Disp: 200 strip, Rfl: 3   Lancets (FREESTYLE) lancets, USE TO TEST TWO TIMES DAILY PRIOR TO MEALS, Disp: 200 each, Rfl: 3   magnesium gluconate (MAGONATE) 500 MG tablet, Take 500 mg by mouth daily as needed (cramps)., Disp: , Rfl:  metoprolol succinate (TOPROL-XL) 25 MG 24 hr tablet, TAKE 1 TABLET BY MOUTH TWICE DAILY, Disp: 180 tablet, Rfl: 3   Multiple Vitamins-Minerals (MULTIVITAL PO), Take 1 tablet by mouth daily., Disp: , Rfl:    nitroGLYCERIN (NITROSTAT) 0.4 MG SL tablet, Place 1 tablet (0.4 mg total) under the tongue every 5 (five) minutes as needed for up to 25 days for chest pain., Disp: 25 tablet, Rfl: 3   Probiotic Product (PROBIOTIC DAILY PO), Take 1 capsule by mouth at bedtime. Ultra Flora IB, Disp: , Rfl:    rosuvastatin (CRESTOR) 40 MG tablet, Take 1 tablet by mouth daily. Please keep upcoming appt for refills.  (Patient taking differently: Take 40 mg by mouth every evening.), Disp: 90 tablet, Rfl: 0   TRUE METRIX BLOOD GLUCOSE TEST test strip, , Disp: , Rfl: 3   TRUEPLUS LANCETS 30G MISC, , Disp: , Rfl: 3   valACYclovir (VALTREX) 500 MG tablet, TAKE 1 TABLET BY MOUTH TWICE DAILY AS NEEDED, Disp: 60 tablet, Rfl: 3   vitamin C (ASCORBIC ACID) 500 MG tablet, Take 500 mg by mouth daily., Disp: , Rfl:    Vitamin D-Vitamin K (VITAMIN K2-VITAMIN D3 PO), Take 1 tablet by mouth every other day., Disp: , Rfl:    zinc gluconate 50 MG tablet, Take 50 mg by mouth every evening., Disp: , Rfl:    clotrimazole (CLOTRIMAZOLE ANTI-FUNGAL) 1 % cream, APPLY TO THE AFFECTED AND SURROUNDING AREAS OF SKIN BY TOPICAL ROUTE 2 TIMES PER DAY IN THE MORNING AND EVENING, Disp: 45 g, Rfl: 1   Estradiol 10 MCG TABS vaginal tablet, INSERT 1 TABLET TWICE A WEEK VAGINALLY AS DIRECTED, Disp: 8 tablet, Rfl: 12   Estradiol 10 MCG TABS vaginal tablet, INSERT 1 TABLET TWICE A WEEK VAGINALLY AS DIRECTED, Disp: 24 tablet, Rfl: 3   moxifloxacin (VIGAMOX) 0.5 % ophthalmic solution, Place 1 drop in left eye 4 times a day starting 2 days prior to surgery and 2 drops morning of surgery, Disp: 3 mL, Rfl: 1   prednisoLONE acetate (PRED FORTE) 1 % ophthalmic suspension, Place 1 drop into the affected eye 2 times a day, Disp: 10 mL, Rfl: 1   predniSONE (DELTASONE) 50 MG tablet, Take 13 hours before TAVR (6:30pm on 9/12) and 6 hours before TAVR (1:30am on 9/13). The last dose will be given to you at the hospital., Disp: 2 tablet, Rfl: 0  Past Medical History: Past Medical History:  Diagnosis Date   Abnormal finding on Pap smear, ASCUS    Anemia    Asthma    Chondromalacia of knee    Coronary artery disease    Diabetes mellitus without complication ([place])    Diverticulum 12/11/2002   in cecum   Fibroids    h/o   GERD (gastroesophageal reflux disease)    H/O tinea cruris 02/28/2010   History of CVA (cerebrovascular accident)    Hypercholesteremia     Hypertension    Obesity    Pneumonia 07/01/2008   S/P TAVR (transcatheter aortic valve replacement) 07/13/2021   s/p TAVR w/ a 29m Edwards S3U via the TF approach by Dr. CBurt Knackand Dr. BCyndia Bent   Synovitis of knee    [place]mat[place]    with nephritis   Vulvitis    h/o    Tobacco Use: Social History   Tobacco Use  Smoking Status Former  Smokeless Tobacco Never    Labs: Recent Review [employer], physiological    Labs for ITP Cardiac and Pulmonary Rehab  Latest Ref Rng & Units 06/11/2021 07/09/2021 07/13/2021 07/13/2021 07/13/2021   Cholestrol 0 - 200 mg/dL - - - - -   LDLCALC 0 - 99 mg/dL - - - - -   HDL >39.00 mg/dL - - - - -   Trlycerides 0.0 - 149.0 mg/dL - - - - -   Hemoglobin A1c 4.8 - 5.6 % - 6.0(H) - - -   PHART 7.350 - 7.450 7.411 7.406 7.353 - -   PCO2ART 32.0 - 48.0 mmHg 41.0 38.7 40.6 - -   HCO3 20.0 - 28.0 mmol/L 26.1 23.8 22.6 - -   TCO2 22 - 32 mmol/L 27 - _0 ACIDBASEDEF 0.0 - 2.0 mmol/L - 0.3 3.0(H) - -   O2SAT % 98.0 97.8 99.0 - -       Capillary Blood Glucose: Lab Results  Component Value Date   GLUCAP 120 (H) 07/14/2021   GLUCAP 93 07/13/2021   GLUCAP 218 (H) 07/13/2021   GLUCAP 134 (H) 07/13/2021   GLUCAP 167 (H) 07/13/2021     Exercise Target Goals: Exercise Program Goal: Individual exercise prescription set using results from initial 6 min walk test and THRR while considering  patient's activity barriers and safety.   Exercise Prescription Goal: Starting with aerobic activity 30 plus minutes a day, 3 days per week for initial exercise prescription. Provide home exercise prescription and guidelines that participant acknowledges understanding prior to discharge.  Activity Barriers & Risk Stratification:  Activity Barriers & Cardiac Risk Stratification - 09/28/21 0851       Activity Barriers & Cardiac Risk Stratification   Activity Barriers History of Falls;Other (comment)    Comments Left knee "bone on bone", needs knee  repalcement.    Cardiac Risk Stratification Moderate             6 Minute Walk:  6 Minute Walk     Row Name 09/28/21 0909         6 Minute Walk   Phase Initial     Distance 1337 feet     Walk Time 6 minutes     # of Rest Breaks 0     MPH 2.53     METS 2.45     RPE 11     Perceived Dyspnea  0     VO2 Peak 8.57     Symptoms No     Resting HR 68 bpm     Resting BP 104/70     Resting Oxygen Saturation  99 %     Exercise Oxygen Saturation  during 6 min walk 98 %     Max Ex. HR 105 bpm     Max Ex. BP 142/82     2 Minute Post BP 118/76              Oxygen Initial Assessment:   Oxygen Re-Evaluation:   Oxygen Discharge (Final Oxygen Re-Evaluation):   Initial Exercise Prescription:  Initial Exercise Prescription - 09/28/21 1000       Date of Initial Exercise RX and Referring Provider   Date 09/28/21    Referring Provider Belva Crome, MD    Expected Discharge Date 11/26/21      NuStep   Level 2    SPM 85    Minutes 15    METs 2      Track   Laps 13    Minutes 15    METs 2.51      Prescription Details  Frequency (times per week) 3    Duration Progress to 30 minutes of continuous aerobic without signs/symptoms of physical distress      Intensity   THRR 40-80% of Max Heartrate 60-119    Ratings of Perceived Exertion 11-13    Perceived Dyspnea 0-4      Progression   Progression Continue to progress workloads to maintain intensity without signs/symptoms of physical distress.      Resistance Training   Training Prescription Yes    Weight 2 lbs    Reps 10-15             Perform Capillary Blood Glucose checks as needed.  Exercise Prescription Changes:   Exercise Comments:   Exercise Goals and Review:   Exercise Goals     Row Name 09/28/21 1103             Exercise Goals   Increase Physical Activity Yes       Intervention Provide advice, education, support and counseling about physical activity/exercise needs.;Develop an  individualized exercise prescription for aerobic and resistive training based on initial evaluation findings, risk stratification, comorbidities and participant's personal goals.       Expected Outcomes Short Term: Attend rehab on a regular basis to increase amount of physical activity.;Long Term: Exercising regularly at least 3-5 days a week.;Long Term: Add in home exercise to make exercise part of routine and to increase amount of physical activity.       Increase Strength and Stamina Yes       Intervention Provide advice, education, support and counseling about physical activity/exercise needs.;Develop an individualized exercise prescription for aerobic and resistive training based on initial evaluation findings, risk stratification, comorbidities and participant's personal goals.       Expected Outcomes Short Term: Increase workloads from initial exercise prescription for resistance, speed, and METs.;Short Term: Perform resistance training exercises routinely during rehab and add in resistance training at home;Long Term: Improve cardiorespiratory fitness, muscular endurance and strength as measured by increased METs and functional capacity (6MWT)       Able to understand and use rate of perceived exertion (RPE) scale Yes       Intervention Provide education and explanation on how to use RPE scale       Expected Outcomes Short Term: Able to use RPE daily in rehab to express subjective intensity level;Long Term:  Able to use RPE to guide intensity level when exercising independently       Knowledge and understanding of Target Heart Rate Range (THRR) Yes       Intervention Provide education and explanation of THRR including how the numbers were predicted and where they are located for reference       Expected Outcomes Short Term: Able to state/look up THRR;Long Term: Able to use THRR to govern intensity when exercising independently;Short Term: Able to use daily as guideline for intensity in rehab        Able to check pulse independently Yes       Intervention Provide education and demonstration on how to check pulse in carotid and radial arteries.;Review the importance of being able to check your own pulse for safety during independent exercise       Expected Outcomes Short Term: Able to explain why pulse checking is important during independent exercise;Long Term: Able to check pulse independently and accurately       Understanding of Exercise Prescription Yes       Intervention Provide education, explanation, and written materials on  patient's individual exercise prescription       Expected Outcomes Short Term: Able to explain program exercise prescription;Long Term: Able to explain home exercise prescription to exercise independently                Exercise Goals Re-Evaluation :    Discharge Exercise Prescription (Final Exercise Prescription Changes):   Nutrition:  Target Goals: Understanding of nutrition guidelines, daily intake of sodium <1570m, cholesterol <2065m calories 30% from fat and 7% or less from saturated fats, daily to have 5 or more servings of fruits and vegetables.  Biometrics:  Pre Biometrics - 09/28/21 0821       Pre Biometrics   Waist Circumference 40.5 inches    Hip Circumference 52.5 inches    Waist to Hip Ratio 0.77 %    Triceps Skinfold 36 mm    % Body Fat 47.9 %    Grip Strength 24 kg    Flexibility 14.5 in    Single Leg Stand 7.93 seconds              Nutrition Therapy Plan and Nutrition Goals:   Nutrition Assessments:  MEDIFICTS Score Key: ?70 Need to make dietary changes  40-70 Heart Healthy Diet ? 40 Therapeutic Level Cholesterol Diet   Picture Your Plate Scores: <4<36nhealthy dietary pattern with much room for improvement. 41-50 Dietary pattern unlikely to meet recommendations for good health and room for improvement. 51-60 More healthful dietary pattern, with some room for improvement.  >60 Healthy dietary pattern,  although there may be some specific behaviors that could be improved.    Nutrition Goals Re-Evaluation:   Nutrition Goals Discharge (Final Nutrition Goals Re-Evaluation):   Psychosocial: Target Goals: Acknowledge presence or absence of significant depression and/or stress, maximize coping skills, provide positive support system. Participant is able to verbalize types and ability to use techniques and skills needed for reducing stress and depression.  Initial Review & Psychosocial Screening:  Initial Psych Review & Screening - 09/28/21 1347       Initial Review   Current issues with None Identified      Family Dynamics   Good Support System? Yes   Brady lives with her husband  and has her 4 children for support     Barriers   Psychosocial barriers to participate in program There are no identifiable barriers or psychosocial needs.      Screening Interventions   Interventions Encouraged to exercise             Quality of Life Scores:  Quality of Life - 09/28/21 1038       Quality of Life   Select Quality of Life      Quality of Life Scores   Health/Function Pre 27.86 %    Socioeconomic Pre 26.5 %    Psych/Spiritual Pre 29.14 %    Family Pre 22.8 %    GLOBAL Pre 27.08 %            Scores of 19 and below usually indicate a poorer quality of life in these areas.  A difference of  2-3 points is a clinically meaningful difference.  A difference of 2-3 points in the total score of the Quality of Life Index has been associated with significant improvement in overall quality of life, self-image, physical symptoms, and general health in studies assessing change in quality of life.  PHQ-9: Recent Review Flowsheet Data     [facility] 09/28/2021 08/31/2016 06/22/2016 01/29/2016 12/01/2015  Decreased Interest 0 0 0 0 0   Down, Depressed, Hopeless 0 0 0 0 0   PHQ - 2 Score 0 0 0 0 0      Interpretation of Total Score  Total Score Depression Severity:  1-4  = Minimal depression, 5-9 = Mild depression, 10-14 = Moderate depression, 15-19 = Moderately severe depression, 20-27 = Severe depression   Psychosocial Evaluation and Intervention:   Psychosocial Re-Evaluation:   Psychosocial Discharge (Final Psychosocial Re-Evaluation):   Vocational Rehabilitation: Provide vocational rehab assistance to qualifying candidates.   Vocational Rehab Evaluation & Intervention:  Vocational Rehab - 09/28/21 1350       Initial Vocational Rehab Evaluation & Intervention   Assessment shows need for Vocational Rehabilitation No   Glendora works full time and does not need vocational rehab at this time.            Education: Education Goals: Education classes will be provided on a weekly basis, covering required topics. Participant will state understanding/return demonstration of topics presented.  Learning Barriers/Preferences:  Learning Barriers/Preferences - 09/28/21 1349       Learning Barriers/Preferences   Learning Barriers Sight   Wears glasses   Learning Preferences Computer/Internet;Pictoral;Skilled Demonstration             Education Topics: Hypertension, Hypertension Reduction -Define heart disease and high blood pressure. Discus how high blood pressure affects the body and ways to reduce high blood pressure.   Exercise and Your Heart -Discuss why it is important to exercise, the FITT principles of exercise, normal and abnormal responses to exercise, and how to exercise safely.   Angina -Discuss definition of angina, causes of angina, treatment of angina, and how to decrease risk of having angina.   Cardiac Medications -Review what the following cardiac medications are used for, how they affect the body, and side effects that may occur when taking the medications.  Medications include Aspirin, Beta blockers, calcium channel blockers, ACE Inhibitors, angiotensin receptor blockers, diuretics, digoxin, and  antihyperlipidemics.   Congestive Heart Failure -Discuss the definition of CHF, how to live with CHF, the signs and symptoms of CHF, and how keep track of weight and sodium intake.   Heart Disease and Intimacy -Discus the effect sexual activity has on the heart, how changes occur during intimacy as we age, and safety during sexual activity.   Smoking Cessation / COPD -Discuss different methods to quit smoking, the health benefits of quitting smoking, and the definition of COPD.   Nutrition I: Fats -Discuss the types of cholesterol, what cholesterol does to the heart, and how cholesterol levels can be controlled.   Nutrition II: Labels -Discuss the different components of food labels and how to read food label   Heart Parts/Heart Disease and PAD -Discuss the anatomy of the heart, the pathway of blood circulation through the heart, and these are affected by heart disease.   Stress I: Signs and Symptoms -Discuss the causes of stress, how stress may lead to anxiety and depression, and ways to limit stress.   Stress II: Relaxation -Discuss different types of relaxation techniques to limit stress.   Warning Signs of Stroke / TIA -Discuss definition of a stroke, what the signs and symptoms are of a stroke, and how to identify when someone is having stroke.   Knowledge Questionnaire Score:  Knowledge Questionnaire Score - 09/28/21 1034       Knowledge Questionnaire Score   Pre Score 22/24  Core Components/Risk Factors/Patient Goals at Admission:  Personal Goals and Risk Factors at Admission - 09/28/21 0824       Core Components/Risk Factors/Patient Goals on Admission    Weight Management Yes;Obesity    Intervention Weight Management/Obesity: Establish reasonable short term and long term weight goals.;Obesity: Provide education and appropriate resources to help participant work on and attain dietary goals.    Admit Weight 217 lb 13 oz (98.8 kg)     Expected Outcomes Short Term: Continue to assess and modify interventions until short term weight is achieved;Long Term: Adherence to nutrition and physical activity/exercise program aimed toward attainment of established weight goal;Weight Loss: Understanding of general recommendations for a balanced deficit meal plan, which promotes 1-2 lb weight loss per week and includes a negative energy balance of [phone] kcal/d    Diabetes Yes    Intervention Provide education about signs/symptoms and action to take for hypo/hyperglycemia.;Provide education about proper nutrition, including hydration, and aerobic/resistive exercise prescription along with prescribed medications to achieve blood glucose in normal ranges: Fasting glucose 65-99 mg/dL    Expected Outcomes Short Term: Participant verbalizes understanding of the signs/symptoms and immediate care of hyper/hypoglycemia, proper foot care and importance of medication, aerobic/resistive exercise and nutrition plan for blood glucose control.;Long Term: Attainment of HbA1C < 7%.    Hypertension Yes    Intervention Provide education on lifestyle modifcations including regular physical activity/exercise, weight management, moderate sodium restriction and increased consumption of fresh fruit, vegetables, and low fat dairy, alcohol moderation, and smoking cessation.;Monitor prescription use compliance.    Expected Outcomes Short Term: Continued assessment and intervention until BP is < 140/82m HG in hypertensive participants. < 130/867mHG in hypertensive participants with diabetes, heart failure or chronic kidney disease.;Long Term: Maintenance of blood pressure at goal levels.    Lipids Yes    Intervention Provide education and support for participant on nutrition & aerobic/resistive exercise along with prescribed medications to achieve LDL <7080mHDL >76m56m  Expected Outcomes Long Term: Cholesterol controlled with medications as prescribed, with individualized  exercise RX and with personalized nutrition plan. Value goals: LDL < 70mg90mL > 40 mg.;Short Term: Participant states understanding of desired cholesterol values and is compliant with medications prescribed. Participant is following exercise prescription and nutrition guidelines.             Core Components/Risk Factors/Patient Goals Review:    Core Components/Risk Factors/Patient Goals at Discharge (Final Review):    ITP Comments:  ITP Comments     Row Name 09/28/21 0821           ITP Comments Medical Director- Dr. TraciFransico Him               Comments:[place] attended orientation on 09/28/2021 to review rules and guidelines for program.  Completed 6 minute walk test, Intitial ITP, and exercise prescription.  VSS. Telemetry-Sinus Rhythm.  Asymptomatic. Safety measures and social distancing in place per CDC guidelines. MariaBarnet PallBSN 09/28/2021 1:55 PM

## 2021-09-28 NOTE — Progress Notes (Signed)
Cardiac Rehab Medication Review by a Nurse  Does the patient  feel that his/her medications are working for him/her?  yes  Has the patient been experiencing any side effects to the medications prescribed?  no  Does the patient measure his/her own blood pressure or blood glucose at home?  yes   Does the patient have any problems obtaining medications due to transportation or finances?   no  Understanding of regimen: good Understanding of indications: good Potential of compliance: excellent    Nurse comments: Deanna Simmons is taking her medications as prescribed and has a good understanding of what her medications are for. Deanna Simmons checks her CBG's daily. Deanna Simmons has a BP monitor at home. Deanna Simmons does not check her BP's every day.    Deanna Simmons See [facility] RN 09/28/2021 9:03 AM

## 2021-10-04 ENCOUNTER — Other Ambulatory Visit: Payer: Self-pay

## 2021-10-04 ENCOUNTER — Encounter (HOSPITAL_COMMUNITY)
Admission: RE | Admit: 2021-10-04 | Discharge: 2021-10-04 | Disposition: A | Discharge status: Home / Self Care | Payer: 59 | Source: Ambulatory Visit | Attending: Interventional Cardiology | Admitting: Interventional Cardiology

## 2021-10-04 ENCOUNTER — Other Ambulatory Visit (HOSPITAL_COMMUNITY): Payer: Self-pay

## 2021-10-04 DIAGNOSIS — Z952 Presence of prosthetic heart valve: Secondary | ICD-10-CM | POA: Diagnosis not present

## 2021-10-04 LAB — GLUCOSE, CAPILLARY
Glucose-Capillary: 154 mg/dL — ABNORMAL HIGH (ref 70–99)
Glucose-Capillary: 106 mg/dL — ABNORMAL HIGH (ref 70–99)

## 2021-10-04 MED ORDER — CIPROFLOXACIN HCL 250 MG PO TABS
ORAL_TABLET | ORAL | 0 refills | Status: DC
  Filled 2021-10-04: qty 6, 3d supply, fill #0

## 2021-10-04 MED ORDER — NITROFURANTOIN MONOHYD MACRO 100 MG PO CAPS
100.0000 mg | ORAL_CAPSULE | Freq: Two times a day (BID) | ORAL | 0 refills | Status: DC
  Filled 2021-10-04: qty 10, 5d supply, fill #0

## 2021-10-04 MED ORDER — PREDNISOLONE ACETATE 1 % OP SUSP
OPHTHALMIC | 1 refills | Status: DC
Start: 2021-10-04 — End: 2021-11-11
  Filled 2021-10-04: qty 10, 40d supply, fill #0

## 2021-10-04 NOTE — Progress Notes (Signed)
Pt started cardiac rehab today.  Pt tolerated exercise without difficulty. VSS, telemetry-NSR, asymptomatic.  Medication list reconciled on 09/28/21 by Barnet Pall RN, Deanna Simmons reports not changes since that review. Pt denies barriers to medicaiton compliance.  PSYCHOSOCIAL ASSESSMENT:  PHQ-0. Pt exhibits positive coping skills, hopeful outlook with supportive family. No psychosocial needs identified at this time, no psychosocial interventions necessary. Pt oriented to exercise equipment and routine.    Understanding verbalized.

## 2021-10-05 ENCOUNTER — Other Ambulatory Visit (HOSPITAL_COMMUNITY): Payer: Self-pay

## 2021-10-05 DIAGNOSIS — Z01411 Encounter for gynecological examination (general) (routine) with abnormal findings: Secondary | ICD-10-CM | POA: Diagnosis not present

## 2021-10-05 DIAGNOSIS — N952 Postmenopausal atrophic vaginitis: Secondary | ICD-10-CM | POA: Diagnosis not present

## 2021-10-05 DIAGNOSIS — E1169 Type 2 diabetes mellitus with other specified complication: Secondary | ICD-10-CM | POA: Diagnosis not present

## 2021-10-05 DIAGNOSIS — Z6839 Body mass index (BMI) 39.0-39.9, adult: Secondary | ICD-10-CM | POA: Diagnosis not present

## 2021-10-05 DIAGNOSIS — N390 Urinary tract infection, site not specified: Secondary | ICD-10-CM | POA: Diagnosis not present

## 2021-10-05 MED ORDER — ESTRADIOL 10 MCG VA TABS
ORAL_TABLET | VAGINAL | 11 refills | Status: DC
  Filled 2021-10-05: qty 8, 30d supply, fill #0

## 2021-10-06 ENCOUNTER — Encounter (HOSPITAL_COMMUNITY)
Admission: RE | Admit: 2021-10-06 | Discharge: 2021-10-06 | Disposition: A | Discharge status: Home / Self Care | Payer: 59 | Source: Ambulatory Visit | Attending: Interventional Cardiology | Admitting: Interventional Cardiology

## 2021-10-06 ENCOUNTER — Other Ambulatory Visit: Payer: Self-pay

## 2021-10-06 DIAGNOSIS — Z952 Presence of prosthetic heart valve: Secondary | ICD-10-CM | POA: Diagnosis not present

## 2021-10-06 LAB — GLUCOSE, CAPILLARY
Glucose-Capillary: 158 mg/dL — ABNORMAL HIGH (ref 70–99)
Glucose-Capillary: 122 mg/dL — ABNORMAL HIGH (ref 70–99)

## 2021-10-07 ENCOUNTER — Telehealth (HOSPITAL_COMMUNITY): Payer: Self-pay

## 2021-10-07 NOTE — Telephone Encounter (Signed)
Per pt, she wanted to change her cardiac rehab class time from 3pm to 7am. Changed pt class time.

## 2021-10-08 ENCOUNTER — Other Ambulatory Visit: Payer: Self-pay

## 2021-10-08 ENCOUNTER — Encounter (HOSPITAL_COMMUNITY): Payer: 59

## 2021-10-08 ENCOUNTER — Encounter (HOSPITAL_COMMUNITY)
Admission: RE | Admit: 2021-10-08 | Discharge: 2021-10-08 | Disposition: A | Discharge status: Home / Self Care | Payer: 59 | Source: Ambulatory Visit | Attending: Interventional Cardiology | Admitting: Interventional Cardiology

## 2021-10-08 ENCOUNTER — Other Ambulatory Visit (HOSPITAL_COMMUNITY): Payer: Self-pay

## 2021-10-08 DIAGNOSIS — Z952 Presence of prosthetic heart valve: Secondary | ICD-10-CM | POA: Diagnosis not present

## 2021-10-08 LAB — GLUCOSE, CAPILLARY
Glucose-Capillary: 134 mg/dL — ABNORMAL HIGH (ref 70–99)
Glucose-Capillary: 127 mg/dL — ABNORMAL HIGH (ref 70–99)

## 2021-10-08 NOTE — Progress Notes (Signed)
Deanna Simmons had completed 30 minutes of exercise this morning and had finished walking the track. As she was drinking water noted a run of PVC's /  9 beat V-tach, rate 120-190.  Deanna Simmons had no complaints, no fluttery feeling, she reported feeling fine. BP standing was 140/62.  She proceeded with  hand-held weights exercise, stretches and cool-down with no further PVCs or V-tach. Notified Deanna Bellis NP of the event. Deanna Bellis NP reviewed the ECG tracing and communicated to this writer may proceed with discharge of patient from Cardiac rehab since patient is asymptomatic and on beta-blocker and since monitored at Cardiac rehab may continue participation next week. Informed Deanna Bellis NP  the patient did not take morning medications, which included not taking the metoprolol. Deanna Bellis NP will forward ECG tracing to Dr. Daneen Schick for any additional advisement needs for patient. Prior to dismissing Deanna Simmons this Probation officer reviewed the rhythm strip with her, Deanna Simmons reported she continued to feel fine and reported she will be reporting to work after Cardiac rehab and she will have breakfast soon and then take morning medications which includes the metoprolol. She verbalized understanding to contact Dr. Tamala Julian if she develops any unusual symptoms.

## 2021-10-11 ENCOUNTER — Encounter (HOSPITAL_COMMUNITY)
Admission: RE | Admit: 2021-10-11 | Discharge: 2021-10-11 | Disposition: A | Discharge status: Home / Self Care | Payer: 59 | Source: Ambulatory Visit | Attending: Interventional Cardiology | Admitting: Interventional Cardiology

## 2021-10-11 ENCOUNTER — Encounter (HOSPITAL_COMMUNITY): Payer: 59

## 2021-10-11 ENCOUNTER — Other Ambulatory Visit: Payer: Self-pay

## 2021-10-11 ENCOUNTER — Other Ambulatory Visit (HOSPITAL_COMMUNITY): Payer: Self-pay

## 2021-10-11 DIAGNOSIS — Z952 Presence of prosthetic heart valve: Secondary | ICD-10-CM

## 2021-10-12 ENCOUNTER — Other Ambulatory Visit (HOSPITAL_COMMUNITY): Payer: Self-pay

## 2021-10-13 ENCOUNTER — Encounter (HOSPITAL_COMMUNITY): Payer: 59

## 2021-10-13 ENCOUNTER — Ambulatory Visit
Admission: RE | Admit: 2021-10-13 | Discharge: 2021-10-13 | Disposition: A | Discharge status: Home / Self Care | Payer: 59 | Source: Ambulatory Visit | Attending: Obstetrics & Gynecology | Admitting: Obstetrics & Gynecology

## 2021-10-13 DIAGNOSIS — Z1231 Encounter for screening mammogram for malignant neoplasm of breast: Secondary | ICD-10-CM | POA: Diagnosis not present

## 2021-10-15 ENCOUNTER — Other Ambulatory Visit: Payer: Self-pay

## 2021-10-15 ENCOUNTER — Encounter (HOSPITAL_COMMUNITY)
Admission: RE | Admit: 2021-10-15 | Discharge: 2021-10-15 | Disposition: A | Discharge status: Home / Self Care | Payer: 59 | Source: Ambulatory Visit | Attending: Interventional Cardiology | Admitting: Interventional Cardiology

## 2021-10-15 ENCOUNTER — Encounter (HOSPITAL_COMMUNITY): Payer: 59

## 2021-10-15 DIAGNOSIS — Z952 Presence of prosthetic heart valve: Secondary | ICD-10-CM | POA: Diagnosis not present

## 2021-10-15 NOTE — Progress Notes (Signed)
CARDIAC REHAB PHASE 2  Reviewed home exercise with pt today. Pt is tolerating exercise well. Pt will continue to exercise on her own by a combination of walking, aerobics and stair climbing for 30 minutes per session 1-2 days a week in addition to the 3 days in CRP2. Advised pt on THRR, RPE scale, hydration and temperature/humidity precautions. Reinforced NTG use, S/S to stop exercise and when to call MD vs 911. Encouraged warm up cool down and stretches with exercise sessions. Pt verbalized understanding, all questions were answered and pt was given a copy to take home.    Kirby Funk ACSM-EP 10/15/2021 1:34 PM

## 2021-10-18 ENCOUNTER — Encounter (HOSPITAL_COMMUNITY)
Admission: RE | Admit: 2021-10-18 | Discharge: 2021-10-18 | Disposition: A | Discharge status: Home / Self Care | Payer: 59 | Source: Ambulatory Visit | Attending: Interventional Cardiology | Admitting: Interventional Cardiology

## 2021-10-18 ENCOUNTER — Other Ambulatory Visit: Payer: Self-pay

## 2021-10-18 ENCOUNTER — Encounter (HOSPITAL_COMMUNITY): Payer: 59

## 2021-10-18 DIAGNOSIS — Z952 Presence of prosthetic heart valve: Secondary | ICD-10-CM | POA: Diagnosis not present

## 2021-10-20 ENCOUNTER — Encounter (HOSPITAL_COMMUNITY)
Admission: RE | Admit: 2021-10-20 | Discharge: 2021-10-20 | Disposition: A | Discharge status: Home / Self Care | Payer: 59 | Source: Ambulatory Visit | Attending: Interventional Cardiology | Admitting: Interventional Cardiology

## 2021-10-20 ENCOUNTER — Other Ambulatory Visit: Payer: Self-pay

## 2021-10-20 ENCOUNTER — Encounter (HOSPITAL_COMMUNITY): Payer: 59

## 2021-10-20 DIAGNOSIS — Z952 Presence of prosthetic heart valve: Secondary | ICD-10-CM

## 2021-10-20 NOTE — Progress Notes (Signed)
Cardiac Individual Treatment Plan  Patient Details  Name: ASHLLEY BOOHER MRN: [id number] Date of Birth: 1951-01-24 Referring Provider:   Flowsheet Row CARDIAC REHAB PHASE II ORIENTATION from 09/28/2021 in [facility]  Referring Provider Belva Crome, MD       Initial Encounter Date:  [place] PHASE II ORIENTATION from 09/28/2021 in [facility]  Date 09/28/21       Visit Diagnosis: 07/13/21 TAVR  Patient's Home Medications on Admission:  Current Outpatient Medications:    acetaminophen (TYLENOL) 500 MG tablet, Take 1,000 mg by mouth every 6 (six) hours as needed for mild pain., Disp: , Rfl:    ADVAIR DISKUS 250-50 MCG/DOSE AEPB, INHALE 1 PUFF INTO THE LUNGS IN THE MORNING AND AT BEDTIME., Disp: 60 each, Rfl: 5   albuterol (VENTOLIN HFA) 108 (90 Base) MCG/ACT inhaler, INHALE 2 PUFFS BY MOUTH EVERY 4-6 HOURS AS NEEDED FOR COUGH OR WHEEZE, Disp: 18 g, Rfl: 1   ALPRAZolam (XANAX) 0.25 MG tablet, Take 1 tablet (0.25 mg total) by mouth 2 (two) times daily as needed for anxiety, Disp: 30 tablet, Rfl: 1   aspirin 81 MG tablet, Take 81 mg by mouth daily., Disp: , Rfl:    beclomethasone (QVAR REDIHALER) 80 MCG/ACT inhaler, Inhale 3 puffs 3 times daily during asthma flares. (Patient taking differently: 3 puffs 3 (three) times daily as needed (asthma flares).), Disp: 10.6 g, Rfl: 5   Blood Glucose Monitoring Suppl (FREESTYLE LITE) w/Device KIT, USE TO TEST TWO TIMES DAILY PRIOR TO MEALS, Disp: 1 kit, Rfl: 3   cetirizine (ZYRTEC) 10 MG tablet, Take 1 tablet (10 mg total) by mouth 2 (two) times daily as needed for allergies (Can use an extra dose during flares). (Patient taking differently: Take 10 mg by mouth in the morning.), Disp: 60 tablet, Rfl: 5   clopidogrel (PLAVIX) 75 MG tablet, Take 1 tablet (75 mg total) by mouth daily., Disp: 90 tablet, Rfl: 1   clotrimazole (CLOTRIMAZOLE ANTI-FUNGAL) 1 % cream, APPLY TO  THE AFFECTED AND SURROUNDING AREAS OF SKIN BY TOPICAL ROUTE 2 TIMES PER DAY IN THE MORNING AND EVENING, Disp: 45 g, Rfl: 1   dapagliflozin propanediol (FARXIGA) 10 MG TABS tablet, Take 1 tablet by mouth once a day, Disp: 90 tablet, Rfl: 3   Estradiol 10 MCG TABS vaginal tablet, INSERT 1 TABLET VAGINALLY TWICE A WEEK AS DIRECTED, Disp: 14 tablet, Rfl: 11   Estradiol 10 MCG TABS vaginal tablet, INSERT 1 TABLET TWICE A WEEK VAGINALLY AS DIRECTED, Disp: 24 tablet, Rfl: 3   Estradiol 10 MCG TABS vaginal tablet, INSERT 1 TABLET TWICE A WEEK VAGINALLY AS DIRECTED, Disp: 8 tablet, Rfl: 11   ezetimibe (ZETIA) 10 MG tablet, Take 1 tablet (10 mg total) by mouth daily., Disp: 90 tablet, Rfl: 3   famotidine (PEPCID) 40 MG tablet, Take 1 tablet (40 mg) by mouth 2 (two) times daily as needed for heartburn or indigestion (Can use an extra dose during flares). (Patient taking differently: Take 40 mg by mouth in the morning.), Disp: 60 tablet, Rfl: 5   ferrous sulfate 325 (65 FE) MG tablet, Take 325 mg by mouth daily., Disp: , Rfl:    fluticasone (FLONASE) 50 MCG/ACT nasal spray, Place 2 sprays into both nostrils daily. (Patient taking differently: Place 2 sprays into both nostrils daily as needed for allergies.), Disp: 48 g, Rfl: 1   fluticasone-salmeterol (ADVAIR) 250-50 MCG/ACT AEPB, Inhale 1 puff into the lungs  1-2 times per day depending on disease activity (must keep appointment for refills), Disp: 60 each, Rfl: 5   glucose blood test strip, USE WHEN CHECKING BLOOD SUGAR TWICE DAILY PRIOR TO MEALS, Disp: 200 strip, Rfl: 3   Lancets (FREESTYLE) lancets, USE TO TEST TWO TIMES DAILY PRIOR TO MEALS, Disp: 200 each, Rfl: 3   magnesium gluconate (MAGONATE) 500 MG tablet, Take 500 mg by mouth daily as needed (cramps)., Disp: , Rfl:    metoprolol succinate (TOPROL-XL) 25 MG 24 hr tablet, TAKE 1 TABLET BY MOUTH TWICE DAILY, Disp: 180 tablet, Rfl: 3   moxifloxacin (VIGAMOX) 0.5 % ophthalmic solution, Place 1 drop in left  eye 4 times a day starting 2 days prior to surgery and 2 drops morning of surgery, Disp: 3 mL, Rfl: 1   Multiple Vitamins-Minerals (MULTIVITAL PO), Take 1 tablet by mouth daily., Disp: , Rfl:    nitrofurantoin, macrocrystal-monohydrate, (MACROBID) 100 MG capsule, Take 1 capsule (100 mg total) by mouth 2 (two) times daily for 5 days, Disp: 10 capsule, Rfl: 0   nitroGLYCERIN (NITROSTAT) 0.4 MG SL tablet, Place 1 tablet (0.4 mg total) under the tongue every 5 (five) minutes as needed for up to 25 days for chest pain., Disp: 25 tablet, Rfl: 3   prednisoLONE acetate (PRED FORTE) 1 % ophthalmic suspension, Place 1 drop into the affected eye 2 times a day, Disp: 10 mL, Rfl: 1   prednisoLONE acetate (PRED FORTE) 1 % ophthalmic suspension, Place 1 drop into affected eye(s) twice daily., Disp: 10 mL, Rfl: 1   predniSONE (DELTASONE) 50 MG tablet, Take 13 hours before TAVR (6:30pm on 9/12) and 6 hours before TAVR (1:30am on 9/13). The last dose will be given to you at the hospital., Disp: 2 tablet, Rfl: 0   Probiotic Product (PROBIOTIC DAILY PO), Take 1 capsule by mouth at bedtime. Ultra Flora IB, Disp: , Rfl:    rosuvastatin (CRESTOR) 40 MG tablet, Take 1 tablet by mouth daily. Please keep upcoming appt for refills. (Patient taking differently: Take 40 mg by mouth every evening.), Disp: 90 tablet, Rfl: 0   TRUE METRIX BLOOD GLUCOSE TEST test strip, , Disp: , Rfl: 3   TRUEPLUS LANCETS 30G MISC, , Disp: , Rfl: 3   valACYclovir (VALTREX) 500 MG tablet, TAKE 1 TABLET BY MOUTH TWICE DAILY AS NEEDED, Disp: 60 tablet, Rfl: 3   vitamin C (ASCORBIC ACID) 500 MG tablet, Take 500 mg by mouth daily., Disp: , Rfl:    Vitamin D-Vitamin K (VITAMIN K2-VITAMIN D3 PO), Take 1 tablet by mouth every other day., Disp: , Rfl:    zinc gluconate 50 MG tablet, Take 50 mg by mouth every evening., Disp: , Rfl:   Past Medical History: Past Medical History:  Diagnosis Date   Abnormal finding on Pap smear, ASCUS    Anemia    Asthma     Chondromalacia of knee    Coronary artery disease    Diabetes mellitus without complication ([place])    Diverticulum 12/11/2002   in cecum   Fibroids    h/o   GERD (gastroesophageal reflux disease)    H/O tinea cruris 02/28/2010   History of CVA (cerebrovascular accident)    Hypercholesteremia    Hypertension    Obesity    Pneumonia 07/01/2008   S/P TAVR (transcatheter aortic valve replacement) 07/13/2021   s/p TAVR w/ a 71mm Edwards S3U via the TF approach by Dr. Burt Knack and Dr. Cyndia Bent.   Synovitis of knee  Systemic lupus erythematosus ([place])    with nephritis   Vulvitis    h/o    Tobacco Use: Social History   Tobacco Use  Smoking Status Former  Smokeless Tobacco Never    Labs: Recent Review Flowsheet Data     Labs for ITP Cardiac and Pulmonary Rehab Latest Ref Rng & Units 06/11/2021 07/09/2021 07/13/2021 07/13/2021 07/13/2021   Cholestrol 0 - 200 mg/dL - - - - -   LDLCALC 0 - 99 mg/dL - - - - -   HDL >39.00 mg/dL - - - - -   Trlycerides 0.0 - 149.0 mg/dL - - - - -   Hemoglobin A1c 4.8 - 5.6 % - 6.0(H) - - -   PHART 7.350 - 7.450 7.411 7.406 7.353 - -   PCO2ART 32.0 - 48.0 mmHg 41.0 38.7 40.6 - -   HCO3 20.0 - 28.0 mmol/L 26.1 23.8 22.6 - -   TCO2 22 - 32 mmol/L 27 - $Re'24 25 24   'HWv$ ACIDBASEDEF 0.0 - 2.0 mmol/L - 0.3 3.0(H) - -   O2SAT % 98.0 97.8 99.0 - -       Capillary Blood Glucose: Lab Results  Component Value Date   GLUCAP 127 (H) 10/08/2021   GLUCAP 134 (H) 10/08/2021   GLUCAP 122 (H) 10/06/2021   GLUCAP 158 (H) 10/06/2021   GLUCAP 106 (H) 10/04/2021     Exercise Target Goals: Exercise Program Goal: Individual exercise prescription set using results from initial 6 min walk test and THRR while considering  patients activity barriers and safety.   Exercise Prescription Goal: Starting with aerobic activity 30 plus minutes a day, 3 days per week for initial exercise prescription. Provide home exercise prescription and guidelines that participant acknowledges  understanding prior to discharge.  Activity Barriers & Risk Stratification:  Activity Barriers & Cardiac Risk Stratification - 09/28/21 0851       Activity Barriers & Cardiac Risk Stratification   Activity Barriers History of Falls;Other (comment)    Comments Left knee "bone on bone", needs knee repalcement.    Cardiac Risk Stratification Moderate             6 Minute Walk:  6 Minute Walk     Row Name 09/28/21 0909         6 Minute Walk   Phase Initial     Distance 1337 feet     Walk Time 6 minutes     # of Rest Breaks 0     MPH 2.53     METS 2.45     RPE 11     Perceived Dyspnea  0     VO2 Peak 8.57     Symptoms No     Resting HR 68 bpm     Resting BP 104/70     Resting Oxygen Saturation  99 %     Exercise Oxygen Saturation  during 6 min walk 98 %     Max Ex. HR 105 bpm     Max Ex. BP 142/82     2 Minute Post BP 118/76              Oxygen Initial Assessment:   Oxygen Re-Evaluation:   Oxygen Discharge (Final Oxygen Re-Evaluation):   Initial Exercise Prescription:  Initial Exercise Prescription - 09/28/21 1000       Date of Initial Exercise RX and Referring Provider   Date 09/28/21    Referring Provider Belva Crome, MD    Expected Discharge  Date 11/26/21      NuStep   Level 2    SPM 85    Minutes 15    METs 2      Track   Laps 13    Minutes 15    METs 2.51      Prescription Details   Frequency (times per week) 3    Duration Progress to 30 minutes of continuous aerobic without signs/symptoms of physical distress      Intensity   THRR 40-80% of Max Heartrate 60-119    Ratings of Perceived Exertion 11-13    Perceived Dyspnea 0-4      Progression   Progression Continue to progress workloads to maintain intensity without signs/symptoms of physical distress.      Resistance Training   Training Prescription Yes    Weight 2 lbs    Reps 10-15             Perform Capillary Blood Glucose checks as needed.  Exercise  Prescription Changes:   Exercise Prescription Changes     Row Name 10/04/21 0830 10/15/21 0830           Response to Exercise   Blood Pressure (Admit) 104/60 120/70      Blood Pressure (Exercise) 148/72 128/70      Blood Pressure (Exit) 104/54 118/72      Heart Rate (Admit) 88 bpm 68 bpm      Heart Rate (Exercise) 114 bpm 100 bpm      Heart Rate (Exit) 74 bpm 72 bpm      Rating of Perceived Exertion (Exercise) 11.5 11      Perceived Dyspnea (Exercise) 0 0      Symptoms 0 0      Comments Pt first day in the [facility] program Reviewed MET's, goals and home ExRx      Duration Progress to 30 minutes of  aerobic without signs/symptoms of physical distress Progress to 30 minutes of  aerobic without signs/symptoms of physical distress      Intensity THRR unchanged THRR unchanged        Progression   Progression Continue to progress workloads to maintain intensity without signs/symptoms of physical distress. Continue to progress workloads to maintain intensity without signs/symptoms of physical distress.      Average METs 2.21 2.37        Resistance Training   Training Prescription Yes Yes      Weight 2 lbs 2 lbs      Reps 10-15 10-15      Time 10 Minutes 10 Minutes        NuStep   Level 2 2      SPM 85 85      Minutes 15 15      METs 1.8 2        Track   Laps 14 14      Minutes 15 15      METs 2.62 2.74        Home Exercise Plan   Plans to continue exercise at -- Home (comment)      Frequency -- Add 2 additional days to program exercise sessions.      Initial Home Exercises Provided -- 10/15/21               Exercise Comments:   Exercise Comments     Row Name 10/04/21 0830 10/15/21 0830         Exercise Comments Pt first day in the [facility] program. Pt  is tolerating exercise well with an average MET level of 2.21. Pt is learning her THRR, RPE and ExRx. Pt is off to a good start Reviewed MET's goals and home Ex Rx. Pt is tolerating exercise well with an average MET level  of 2.37. Pt goal is wt loss, pt has not lost any wt yet, but she is still early on in the program. Encouraged healthy diet and Exercise at home. Pt will either walk, areobics or stairs for 1-2 days for 30 minutes               Exercise Goals and Review:   Exercise Goals     Row Name 09/28/21 5038             Exercise Goals   Increase Physical Activity Yes       Intervention Provide advice, education, support and counseling about physical activity/exercise needs.;Develop an individualized exercise prescription for aerobic and resistive training based on initial evaluation findings, risk stratification, comorbidities and participant's personal goals.       Expected Outcomes Short Term: Attend rehab on a regular basis to increase amount of physical activity.;Long Term: Exercising regularly at least 3-5 days a week.;Long Term: Add in home exercise to make exercise part of routine and to increase amount of physical activity.       Increase Strength and Stamina Yes       Intervention Provide advice, education, support and counseling about physical activity/exercise needs.;Develop an individualized exercise prescription for aerobic and resistive training based on initial evaluation findings, risk stratification, comorbidities and participant's personal goals.       Expected Outcomes Short Term: Increase workloads from initial exercise prescription for resistance, speed, and METs.;Short Term: Perform resistance training exercises routinely during rehab and add in resistance training at home;Long Term: Improve cardiorespiratory fitness, muscular endurance and strength as measured by increased METs and functional capacity (6MWT)       Able to understand and use rate of perceived exertion (RPE) scale Yes       Intervention Provide education and explanation on how to use RPE scale       Expected Outcomes Short Term: Able to use RPE daily in rehab to express subjective intensity level;Long Term:  Able  to use RPE to guide intensity level when exercising independently       Knowledge and understanding of Target Heart Rate Range (THRR) Yes       Intervention Provide education and explanation of THRR including how the numbers were predicted and where they are located for reference       Expected Outcomes Short Term: Able to state/look up THRR;Long Term: Able to use THRR to govern intensity when exercising independently;Short Term: Able to use daily as guideline for intensity in rehab       Able to check pulse independently Yes       Intervention Provide education and demonstration on how to check pulse in carotid and radial arteries.;Review the importance of being able to check your own pulse for safety during independent exercise       Expected Outcomes Short Term: Able to explain why pulse checking is important during independent exercise;Long Term: Able to check pulse independently and accurately       Understanding of Exercise Prescription Yes       Intervention Provide education, explanation, and written materials on patient's individual exercise prescription       Expected Outcomes Short Term: Able to explain program exercise prescription;Long Term: Able  to explain home exercise prescription to exercise independently                Exercise Goals Re-Evaluation :  Exercise Goals Re-Evaluation     Row Name 10/04/21 0830 10/15/21 0830           Exercise Goal Re-Evaluation   Exercise Goals Review Increase Physical Activity;Increase Strength and Stamina;Able to understand and use rate of perceived exertion (RPE) scale;Knowledge and understanding of Target Heart Rate Range (THRR);Understanding of Exercise Prescription Increase Physical Activity;Increase Strength and Stamina;Able to understand and use rate of perceived exertion (RPE) scale;Knowledge and understanding of Target Heart Rate Range (THRR);Understanding of Exercise Prescription      Comments Pt first day in the [facility] program. Pt is  tolerating exercise well with an average MET level of 2.21. Pt is learning her THRR, RPE and ExRx. Reviewed MET's goals and home Ex Rx. Pt is tolerating exercise well with an average MET level of 2.37. Pt goal is wt loss, pt has not lost any wt yet, but she is still early on in the program. Encouraged healthy diet and Exercise at home. Pt will either walk, areobics or stairs for 1-2 days for 30 minutes      Expected Outcomes Will continue to monitor pt and progress workloads as tolerated without sign or symptom Pt will continue to exercise on her own at home. Will continue to monitor pt and progress workloads as tolerated without sign or symptom                Discharge Exercise Prescription (Final Exercise Prescription Changes):  Exercise Prescription Changes - 10/15/21 0830       Response to Exercise   Blood Pressure (Admit) 120/70    Blood Pressure (Exercise) 128/70    Blood Pressure (Exit) 118/72    Heart Rate (Admit) 68 bpm    Heart Rate (Exercise) 100 bpm    Heart Rate (Exit) 72 bpm    Rating of Perceived Exertion (Exercise) 11    Perceived Dyspnea (Exercise) 0    Symptoms 0    Comments Reviewed MET's, goals and home ExRx    Duration Progress to 30 minutes of  aerobic without signs/symptoms of physical distress    Intensity THRR unchanged      Progression   Progression Continue to progress workloads to maintain intensity without signs/symptoms of physical distress.    Average METs 2.37      Resistance Training   Training Prescription Yes    Weight 2 lbs    Reps 10-15    Time 10 Minutes      NuStep   Level 2    SPM 85    Minutes 15    METs 2      Track   Laps 14    Minutes 15    METs 2.74      Home Exercise Plan   Plans to continue exercise at Home (comment)    Frequency Add 2 additional days to program exercise sessions.    Initial Home Exercises Provided 10/15/21             Nutrition:  Target Goals: Understanding of nutrition guidelines, daily  intake of sodium 1500mg , cholesterol 200mg , calories 30% from fat and 7% or less from saturated fats, daily to have 5 or more servings of fruits and vegetables.  Biometrics:  Pre Biometrics - 09/28/21 0821       Pre Biometrics   Waist Circumference 40.5 inches  Hip Circumference 52.5 inches    Waist to Hip Ratio 0.77 %    Triceps Skinfold 36 mm    % Body Fat 47.9 %    Grip Strength 24 kg    Flexibility 14.5 in    Single Leg Stand 7.93 seconds              Nutrition Therapy Plan and Nutrition Goals:   Nutrition Assessments:  MEDIFICTS Score Key: ?70 Need to make dietary changes  40-70 Heart Healthy Diet ? 40 Therapeutic Level Cholesterol Diet   Picture Your Plate Scores: <62 Unhealthy dietary pattern with much room for improvement. 41-50 Dietary pattern unlikely to meet recommendations for good health and room for improvement. 51-60 More healthful dietary pattern, with some room for improvement.  >60 Healthy dietary pattern, although there may be some specific behaviors that could be improved.    Nutrition Goals Re-Evaluation:   Nutrition Goals Discharge (Final Nutrition Goals Re-Evaluation):   Psychosocial: Target Goals: Acknowledge presence or absence of significant depression and/or stress, maximize coping skills, provide positive support system. Participant is able to verbalize types and ability to use techniques and skills needed for reducing stress and depression.  Initial Review & Psychosocial Screening:  Initial Psych Review & Screening - 09/28/21 1347       Initial Review   Current issues with None Identified      Family Dynamics   Good Support System? Yes   Kaina lives with her husband  and has her 4 children for support     Barriers   Psychosocial barriers to participate in program There are no identifiable barriers or psychosocial needs.      Screening Interventions   Interventions Encouraged to exercise             Quality of  Life Scores:  Quality of Life - 09/28/21 1038       Quality of Life   Select Quality of Life      Quality of Life Scores   Health/Function Pre 27.86 %    Socioeconomic Pre 26.5 %    Psych/Spiritual Pre 29.14 %    Family Pre 22.8 %    GLOBAL Pre 27.08 %            Scores of 19 and below usually indicate a poorer quality of life in these areas.  A difference of  2-3 points is a clinically meaningful difference.  A difference of 2-3 points in the total score of the Quality of Life Index has been associated with significant improvement in overall quality of life, self-image, physical symptoms, and general health in studies assessing change in quality of life.  PHQ-9: Recent Review Flowsheet Data     Depression screen [facility] 2/9 09/28/2021 08/31/2016 06/22/2016 01/29/2016 12/01/2015   Decreased Interest 0 0 0 0 0   Down, Depressed, Hopeless 0 0 0 0 0   PHQ - 2 Score 0 0 0 0 0      Interpretation of Total Score  Total Score Depression Severity:  1-4 = Minimal depression, 5-9 = Mild depression, 10-14 = Moderate depression, 15-19 = Moderately severe depression, 20-27 = Severe depression   Psychosocial Evaluation and Intervention:   Psychosocial Re-Evaluation:  Psychosocial Re-Evaluation     [place] Name 10/20/21 1719             Psychosocial Re-Evaluation   Current issues with None Identified       Interventions Encouraged to attend Cardiac Rehabilitation for the exercise  Continue Psychosocial Services  No Follow up required                Psychosocial Discharge (Final Psychosocial Re-Evaluation):  Psychosocial Re-Evaluation - 10/20/21 1719       Psychosocial Re-Evaluation   Current issues with None Identified    Interventions Encouraged to attend Cardiac Rehabilitation for the exercise    Continue Psychosocial Services  No Follow up required             Vocational Rehabilitation: Provide vocational rehab assistance to qualifying candidates.   Vocational  Rehab Evaluation & Intervention:  Vocational Rehab - 09/28/21 1350       Initial Vocational Rehab Evaluation & Intervention   Assessment shows need for Vocational Rehabilitation No   Mahari works full time and does not need vocational rehab at this time.            Education: Education Goals: Education classes will be provided on a weekly basis, covering required topics. Participant will state understanding/return demonstration of topics presented.  Learning Barriers/Preferences:  Learning Barriers/Preferences - 09/28/21 1349       Learning Barriers/Preferences   Learning Barriers Sight   Wears glasses   Learning Preferences Computer/Internet;Pictoral;Skilled Demonstration             Education Topics: Hypertension, Hypertension Reduction -Define heart disease and high blood pressure. Discus how high blood pressure affects the body and ways to reduce high blood pressure.   Exercise and Your Heart -Discuss why it is important to exercise, the FITT principles of exercise, normal and abnormal responses to exercise, and how to exercise safely.   Angina -Discuss definition of angina, causes of angina, treatment of angina, and how to decrease risk of having angina.   Cardiac Medications -Review what the following cardiac medications are used for, how they affect the body, and side effects that may occur when taking the medications.  Medications include Aspirin, Beta blockers, calcium channel blockers, ACE Inhibitors, angiotensin receptor blockers, diuretics, digoxin, and antihyperlipidemics.   Congestive Heart Failure -Discuss the definition of CHF, how to live with CHF, the signs and symptoms of CHF, and how keep track of weight and sodium intake.   Heart Disease and Intimacy -Discus the effect sexual activity has on the heart, how changes occur during intimacy as we age, and safety during sexual activity.   Smoking Cessation / COPD -Discuss different methods to quit  smoking, the health benefits of quitting smoking, and the definition of COPD.   Nutrition I: Fats -Discuss the types of cholesterol, what cholesterol does to the heart, and how cholesterol levels can be controlled.   Nutrition II: Labels -Discuss the different components of food labels and how to read food label   Heart Parts/Heart Disease and PAD -Discuss the anatomy of the heart, the pathway of blood circulation through the heart, and these are affected by heart disease.   Stress I: Signs and Symptoms -Discuss the causes of stress, how stress may lead to anxiety and depression, and ways to limit stress.   Stress II: Relaxation -Discuss different types of relaxation techniques to limit stress.   Warning Signs of Stroke / TIA -Discuss definition of a stroke, what the signs and symptoms are of a stroke, and how to identify when someone is having stroke.   Knowledge Questionnaire Score:  Knowledge Questionnaire Score - 09/28/21 1034       Knowledge Questionnaire Score   Pre Score 22/24  Core Components/Risk Factors/Patient Goals at Admission:  Personal Goals and Risk Factors at Admission - 09/28/21 0824       Core Components/Risk Factors/Patient Goals on Admission    Weight Management Yes;Obesity    Intervention Weight Management/Obesity: Establish reasonable short term and long term weight goals.;Obesity: Provide education and appropriate resources to help participant work on and attain dietary goals.    Admit Weight 217 lb 13 oz (98.8 kg)    Expected Outcomes Short Term: Continue to assess and modify interventions until short term weight is achieved;Long Term: Adherence to nutrition and physical activity/exercise program aimed toward attainment of established weight goal;Weight Loss: Understanding of general recommendations for a balanced deficit meal plan, which promotes 1-2 lb weight loss per week and includes a negative energy balance of [phone] kcal/d     Diabetes Yes    Intervention Provide education about signs/symptoms and action to take for hypo/hyperglycemia.;Provide education about proper nutrition, including hydration, and aerobic/resistive exercise prescription along with prescribed medications to achieve blood glucose in normal ranges: Fasting glucose 65-99 mg/dL    Expected Outcomes Short Term: Participant verbalizes understanding of the signs/symptoms and immediate care of hyper/hypoglycemia, proper foot care and importance of medication, aerobic/resistive exercise and nutrition plan for blood glucose control.;Long Term: Attainment of HbA1C < 7%.    Hypertension Yes    Intervention Provide education on lifestyle modifcations including regular physical activity/exercise, weight management, moderate sodium restriction and increased consumption of fresh fruit, vegetables, and low fat dairy, alcohol moderation, and smoking cessation.;Monitor prescription use compliance.    Expected Outcomes Short Term: Continued assessment and intervention until BP is < 140/27mm HG in hypertensive participants. < 130/52mm HG in hypertensive participants with diabetes, heart failure or chronic kidney disease.;Long Term: Maintenance of blood pressure at goal levels.    Lipids Yes    Intervention Provide education and support for participant on nutrition & aerobic/resistive exercise along with prescribed medications to achieve LDL '70mg'$ , HDL >$Remo'40mg'blfdv$ .    Expected Outcomes Long Term: Cholesterol controlled with medications as prescribed, with individualized exercise RX and with personalized nutrition plan. Value goals: LDL < $Rem'70mg'LNGq$ , HDL > 40 mg.;Short Term: Participant states understanding of desired cholesterol values and is compliant with medications prescribed. Participant is following exercise prescription and nutrition guidelines.             Core Components/Risk Factors/Patient Goals Review:   Goals and Risk Factor Review     Row Name 10/20/21 1720              Core Components/Risk Factors/Patient Goals Review   Personal Goals Review Weight Management/Obesity;Diabetes;Hypertension;Lipids       Review Gelene Mink has been doing well with exercise at phase 2 cardiac rehab. Vital sign and CBg's have been stable. no further ventricular ectopy noted       Expected Outcomes Arleigh will contiue to participate in phase 2 for exercise, nutrition and lifestyle modifications                Core Components/Risk Factors/Patient Goals at Discharge (Final Review):   Goals and Risk Factor Review - 10/20/21 1720       Core Components/Risk Factors/Patient Goals Review   Personal Goals Review Weight Management/Obesity;Diabetes;Hypertension;Lipids    Review Gelene Mink has been doing well with exercise at phase 2 cardiac rehab. Vital sign and CBg's have been stable. no further ventricular ectopy noted    Expected Outcomes Brinley will contiue to participate in phase 2 for exercise, nutrition and lifestyle modifications  ITP Comments:  ITP Comments     Row Name 09/28/21 0821 10/20/21 1718         ITP Comments Medical Director- Dr. Fransico Him, MD 30 Day ITP Review. Safiyyah has good attendance and participation in phase 2 cardiac rehab.               Comments: See ITP comments.Harrell Gave RN BSN

## 2021-10-22 ENCOUNTER — Encounter (HOSPITAL_COMMUNITY): Payer: 59

## 2021-10-22 ENCOUNTER — Telehealth (HOSPITAL_COMMUNITY): Payer: Self-pay | Admitting: *Deleted

## 2021-10-22 NOTE — Telephone Encounter (Signed)
Patient called out due to bad weather.

## 2021-10-27 ENCOUNTER — Encounter (HOSPITAL_COMMUNITY): Payer: 59

## 2021-10-27 ENCOUNTER — Encounter (HOSPITAL_COMMUNITY)
Admission: RE | Admit: 2021-10-27 | Discharge: 2021-10-27 | Disposition: A | Discharge status: Home / Self Care | Payer: 59 | Source: Ambulatory Visit | Attending: Interventional Cardiology | Admitting: Interventional Cardiology

## 2021-10-27 ENCOUNTER — Other Ambulatory Visit: Payer: Self-pay

## 2021-10-27 DIAGNOSIS — Z952 Presence of prosthetic heart valve: Secondary | ICD-10-CM

## 2021-10-29 ENCOUNTER — Encounter (HOSPITAL_COMMUNITY): Payer: 59

## 2021-11-03 ENCOUNTER — Encounter (HOSPITAL_COMMUNITY): Payer: 59

## 2021-11-03 ENCOUNTER — Other Ambulatory Visit: Payer: Self-pay

## 2021-11-03 ENCOUNTER — Other Ambulatory Visit (HOSPITAL_COMMUNITY): Payer: Self-pay

## 2021-11-03 ENCOUNTER — Encounter (HOSPITAL_COMMUNITY)
Admission: RE | Admit: 2021-11-03 | Discharge: 2021-11-03 | Disposition: A | Discharge status: Home / Self Care | Payer: 59 | Source: Ambulatory Visit | Attending: Interventional Cardiology | Admitting: Interventional Cardiology

## 2021-11-03 DIAGNOSIS — Z87891 Personal history of nicotine dependence: Secondary | ICD-10-CM | POA: Diagnosis not present

## 2021-11-03 DIAGNOSIS — Z952 Presence of prosthetic heart valve: Secondary | ICD-10-CM | POA: Diagnosis not present

## 2021-11-04 DIAGNOSIS — Z8673 Personal history of transient ischemic attack (TIA), and cerebral infarction without residual deficits: Secondary | ICD-10-CM | POA: Diagnosis not present

## 2021-11-04 DIAGNOSIS — D509 Iron deficiency anemia, unspecified: Secondary | ICD-10-CM | POA: Diagnosis not present

## 2021-11-04 DIAGNOSIS — Z7984 Long term (current) use of oral hypoglycemic drugs: Secondary | ICD-10-CM | POA: Diagnosis not present

## 2021-11-04 DIAGNOSIS — Z0001 Encounter for general adult medical examination with abnormal findings: Secondary | ICD-10-CM | POA: Diagnosis not present

## 2021-11-04 DIAGNOSIS — E559 Vitamin D deficiency, unspecified: Secondary | ICD-10-CM | POA: Diagnosis not present

## 2021-11-04 DIAGNOSIS — E1165 Type 2 diabetes mellitus with hyperglycemia: Secondary | ICD-10-CM | POA: Diagnosis not present

## 2021-11-04 DIAGNOSIS — E1149 Type 2 diabetes mellitus with other diabetic neurological complication: Secondary | ICD-10-CM | POA: Diagnosis not present

## 2021-11-04 DIAGNOSIS — G4733 Obstructive sleep apnea (adult) (pediatric): Secondary | ICD-10-CM | POA: Diagnosis not present

## 2021-11-04 DIAGNOSIS — I2510 Atherosclerotic heart disease of native coronary artery without angina pectoris: Secondary | ICD-10-CM | POA: Diagnosis not present

## 2021-11-04 DIAGNOSIS — E785 Hyperlipidemia, unspecified: Secondary | ICD-10-CM | POA: Diagnosis not present

## 2021-11-04 DIAGNOSIS — Z952 Presence of prosthetic heart valve: Secondary | ICD-10-CM | POA: Diagnosis not present

## 2021-11-05 ENCOUNTER — Other Ambulatory Visit: Payer: Self-pay

## 2021-11-05 ENCOUNTER — Encounter (HOSPITAL_COMMUNITY): Payer: 59

## 2021-11-05 ENCOUNTER — Encounter (HOSPITAL_COMMUNITY)
Admission: RE | Admit: 2021-11-05 | Discharge: 2021-11-05 | Disposition: A | Discharge status: Home / Self Care | Payer: 59 | Source: Ambulatory Visit | Attending: Interventional Cardiology | Admitting: Interventional Cardiology

## 2021-11-05 DIAGNOSIS — Z952 Presence of prosthetic heart valve: Secondary | ICD-10-CM

## 2021-11-05 DIAGNOSIS — Z87891 Personal history of nicotine dependence: Secondary | ICD-10-CM | POA: Diagnosis not present

## 2021-11-08 ENCOUNTER — Encounter (HOSPITAL_COMMUNITY): Payer: 59

## 2021-11-08 ENCOUNTER — Encounter (HOSPITAL_COMMUNITY)
Admission: RE | Admit: 2021-11-08 | Discharge: 2021-11-08 | Disposition: A | Discharge status: Home / Self Care | Payer: 59 | Source: Ambulatory Visit | Attending: Interventional Cardiology | Admitting: Interventional Cardiology

## 2021-11-08 ENCOUNTER — Other Ambulatory Visit: Payer: Self-pay

## 2021-11-08 DIAGNOSIS — Z952 Presence of prosthetic heart valve: Secondary | ICD-10-CM

## 2021-11-08 DIAGNOSIS — Z87891 Personal history of nicotine dependence: Secondary | ICD-10-CM | POA: Diagnosis not present

## 2021-11-10 ENCOUNTER — Encounter (HOSPITAL_COMMUNITY): Payer: 59

## 2021-11-10 ENCOUNTER — Other Ambulatory Visit: Payer: Self-pay

## 2021-11-10 ENCOUNTER — Encounter (HOSPITAL_COMMUNITY)
Admission: RE | Admit: 2021-11-10 | Discharge: 2021-11-10 | Disposition: A | Discharge status: Home / Self Care | Payer: 59 | Source: Ambulatory Visit | Attending: Interventional Cardiology | Admitting: Interventional Cardiology

## 2021-11-10 DIAGNOSIS — Z87891 Personal history of nicotine dependence: Secondary | ICD-10-CM | POA: Diagnosis not present

## 2021-11-10 DIAGNOSIS — Z952 Presence of prosthetic heart valve: Secondary | ICD-10-CM | POA: Diagnosis not present

## 2021-11-10 NOTE — Progress Notes (Signed)
NEUROLOGY FOLLOW UP OFFICE NOTE  Deanna Simmons [id number]  Assessment/Plan:    Possible cerebellar transient ischemic attack  Hypertension  Hyperlipidemia  Type 2 diabetes mellitus  Coronary artery disease  Obstructive sleep apnea  Aortic stenosis s/p TAVR   1.Secondary stroke prevention as managed by PCP/cardiology:  - ASA 77m daily  - Statin.  LDL goal less than 70  - Normotensive blood pressure  - Hgb A1c goal less than 7 2. Mediterranean diet 3. Exercise 4.Follow up as needed.     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  Accompanied by her daughter via phone.   UPDATE: Current medications:  ASA 875mBID, metformin, Lopressor, Crestor 4059mUnderwent TIA workup: LDL from 04/13/2021 was 95.  CTA head and neck on 05/20/2021 personally reviewed revealed mild atherosclerotic change at the right carotid bifurcation but no large vessel occlusion or hemodynamically significant stenosis.  3 week cardiac event monitor in July did not detect atrial fibrillation.  2D echocardiogram on 05/05/2021 showed EF 55-60% with no atrial septal defect but right bowing of IAS suggestive for elevated left atrial pressure but also demonstrated severe aortic stenosis.  She underwent TAVR on 07/13/2021.  She is feeling okay.  She is in cardiac rehab.  Hgb A1c on 07/09/2021 was 6.  HISTORY: [facility] she walked out of the elevator and noticed that she was walking veering to the right.  No associated dizziness, unilateral numbness or weakness, slurred speech, visual disturbance, or headache.  Symptom lasted about 5 minutes.  However, she then started to feel "swimmy headed".  She went down to the ED.  Neurologic exam reportedly unremarkable.  CT head and MRI of brain personally reviewed showed chronic small vessel  ischemic changes with remote small lacunar infarcts of the right basal ganglia but no acute abnormalities.  Due to low ABCD 2 score and resolution of symptoms, she was discharged with outpatient neurology follow-up.  She followed up with her PCP who felt symptoms were related to FarIrand switched her back to metformin and started her on iron supplement.  He had her take ASA twice daily.  She didn't feel quite right for a few more days and then symptoms completely resolved.    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 CoopBurt Knack Dr. BartCyndia BentSynovitis of knee    Systemic lupus erythematosus (HCC)    with nephritis   Vulvitis    h/o    MEDICATIONS: Current Outpatient Medications on File Prior to Visit  Medication Sig Dispense Refill   acetaminophen (TYLENOL) 500 MG tablet Take 1,000 mg by mouth every  6 (six) hours as needed for mild pain.     ADVAIR DISKUS 250-50 MCG/DOSE AEPB INHALE 1 PUFF INTO THE LUNGS IN THE MORNING AND AT BEDTIME. 60 each 5   albuterol (VENTOLIN HFA) 108 (90 Base) MCG/ACT inhaler INHALE 2 PUFFS BY MOUTH EVERY 4-6 HOURS AS NEEDED FOR COUGH OR WHEEZE 18 g 1   ALPRAZolam (XANAX) 0.25 MG tablet Take 1 tablet (0.25 mg total) by mouth 2 (two) times daily as needed for anxiety 30 tablet 1   aspirin 81 MG tablet Take 81 mg by mouth daily.     beclomethasone (QVAR REDIHALER) 80 MCG/ACT inhaler Inhale 3 puffs 3 times daily  during asthma flares. (Patient taking differently: 3 puffs 3 (three) times daily as needed (asthma flares).) 10.6 g 5   Blood Glucose Monitoring Suppl (FREESTYLE LITE) w/Device KIT USE TO TEST TWO TIMES DAILY PRIOR TO MEALS 1 kit 3   cetirizine (ZYRTEC) 10 MG tablet Take 1 tablet (10 mg total) by mouth 2 (two) times daily as needed for allergies (Can use an extra dose during flares). (Patient taking differently: Take 10 mg by mouth in the morning.) 60 tablet 5   clopidogrel (PLAVIX) 75 MG tablet Take 1 tablet (75 mg total) by mouth daily. 90 tablet 1   clotrimazole (CLOTRIMAZOLE ANTI-FUNGAL) 1 % cream APPLY TO THE AFFECTED AND SURROUNDING AREAS OF SKIN BY TOPICAL ROUTE 2 TIMES PER DAY IN THE MORNING AND EVENING 45 g 1   dapagliflozin propanediol (FARXIGA) 10 MG TABS tablet Take 1 tablet by mouth once a day 90 tablet 3   Estradiol 10 MCG TABS vaginal tablet INSERT 1 TABLET VAGINALLY TWICE A WEEK AS DIRECTED 14 tablet 11   Estradiol 10 MCG TABS vaginal tablet INSERT 1 TABLET TWICE A WEEK VAGINALLY AS DIRECTED 24 tablet 3   Estradiol 10 MCG TABS vaginal tablet INSERT 1 TABLET TWICE A WEEK VAGINALLY AS DIRECTED 8 tablet 11   ezetimibe (ZETIA) 10 MG tablet Take 1 tablet (10 mg total) by mouth daily. 90 tablet 3   famotidine (PEPCID) 40 MG tablet Take 1 tablet (40 mg) by mouth 2 (two) times daily as needed for heartburn or indigestion (Can use an extra dose during flares). (Patient taking differently: Take 40 mg by mouth in the morning.) 60 tablet 5   ferrous sulfate 325 (65 FE) MG tablet Take 325 mg by mouth daily.     fluticasone (FLONASE) 50 MCG/ACT nasal spray Place 2 sprays into both nostrils daily. (Patient taking differently: Place 2 sprays into both nostrils daily as needed for allergies.) 48 g 1   fluticasone-salmeterol (ADVAIR) 250-50 MCG/ACT AEPB Inhale 1 puff into the lungs 1-2 times per day depending on disease activity (must keep appointment for refills) 60 each 5   glucose blood test strip USE  WHEN CHECKING BLOOD SUGAR TWICE DAILY PRIOR TO MEALS 200 strip 3   Lancets (FREESTYLE) lancets USE TO TEST TWO TIMES DAILY PRIOR TO MEALS 200 each 3   magnesium gluconate (MAGONATE) 500 MG tablet Take 500 mg by mouth daily as needed (cramps).     metoprolol succinate (TOPROL-XL) 25 MG 24 hr tablet TAKE 1 TABLET BY MOUTH TWICE DAILY 180 tablet 3   moxifloxacin (VIGAMOX) 0.5 % ophthalmic solution Place 1 drop in left eye 4 times a day starting 2 days prior to surgery and 2 drops morning of surgery 3 mL 1   Multiple Vitamins-Minerals (MULTIVITAL PO) Take 1 tablet by mouth daily.     nitrofurantoin, macrocrystal-monohydrate, (MACROBID)  100 MG capsule Take 1 capsule (100 mg total) by mouth 2 (two) times daily for 5 days 10 capsule 0   nitroGLYCERIN (NITROSTAT) 0.4 MG SL tablet Place 1 tablet (0.4 mg total) under the tongue every 5 (five) minutes as needed for up to 25 days for chest pain. 25 tablet 3   prednisoLONE acetate (PRED FORTE) 1 % ophthalmic suspension Place 1 drop into the affected eye 2 times a day 10 mL 1   prednisoLONE acetate (PRED FORTE) 1 % ophthalmic suspension Place 1 drop into affected eye(s) twice daily. 10 mL 1   predniSONE (DELTASONE) 50 MG tablet Take 13 hours before TAVR (6:30pm on 9/12) and 6 hours before TAVR (1:30am on 9/13). The last dose will be given to you at the hospital. 2 tablet 0   Probiotic Product (PROBIOTIC DAILY PO) Take 1 capsule by mouth at bedtime. Ultra Flora IB     rosuvastatin (CRESTOR) 40 MG tablet Take 1 tablet by mouth daily. Please keep upcoming appt for refills. (Patient taking differently: Take 40 mg by mouth every evening.) 90 tablet 0   TRUE METRIX BLOOD GLUCOSE TEST test strip   3   TRUEPLUS LANCETS 30G MISC   3   valACYclovir (VALTREX) 500 MG tablet TAKE 1 TABLET BY MOUTH TWICE DAILY AS NEEDED 60 tablet 3   vitamin C (ASCORBIC ACID) 500 MG tablet Take 500 mg by mouth daily.     Vitamin D-Vitamin K (VITAMIN K2-VITAMIN D3 PO) Take 1 tablet by mouth  every other day.     zinc gluconate 50 MG tablet Take 50 mg by mouth every evening.     No current facility-administered medications on file prior to visit.    ALLERGIES: Allergies  Allergen Reactions   Biaxin [Clarithromycin] Itching   Cardiolite [Technetium-16m Itching   Ciprofloxacin Itching   Erythromycin Base Itching   Montelukast Sodium Itching   Niaspan [Niacin Er] Other (See Comments)    flushing   Penicillins Itching    Reaction: over 10 years   Prilosec [Omeprazole] Itching   Singulair [Montelukast] Hives and Itching   Ticlid [Ticlopidine Hcl] Itching   Iodinated Contrast Media Itching    Pt has a potential contrast allergy; She states in 2012 her cardiologist told her she had a reaction during the "study". We are unable to determine the study or confirm a reaction. The patient will have a 13 hr prep for future studies. Molecular Imaging states their injection does not cause any reactions.    FAMILY HISTORY: Family History  Problem Relation Age of Onset   Diabetes Mother    Stroke Mother    Breast cancer Mother [phone]  Prostate cancer Father    Diabetes Father    Hyperlipidemia Father    Heart attack Paternal Uncle        2 paternal uncles   Ovarian cancer Cousin       Objective:  Blood pressure 120/67, pulse 75, height _0  (1.651 m), weight 219 lb 9.6 oz (99.6 kg), SpO2 96 %. General: No acute distress.  Patient appears well-groomed.   Head:  Normocephalic/atraumatic Eyes:  Fundi examined but not visualized Neck: supple, no paraspinal tenderness, full range of motion Heart:  Regular rate and rhythm Lungs:  Clear to auscultation bilaterally Back: No paraspinal tenderness Neurological Exam: alert and oriented to person, place, and time.  Speech fluent and not dysarthric, language intact.  CN II-XII intact. Bulk and tone normal, muscle strength 5/5 throughout.  Sensation to light  touch intact.  Deep tendon reflexes 2+ throughout, toes downgoing.  Finger to  nose testing intact.  Gait normal, Romberg negative.   Metta Clines, DO  CC: Josetta Huddle, MD

## 2021-11-11 ENCOUNTER — Ambulatory Visit: Payer: 59 | Admitting: Neurology

## 2021-11-11 ENCOUNTER — Other Ambulatory Visit (HOSPITAL_COMMUNITY): Payer: Self-pay

## 2021-11-11 ENCOUNTER — Other Ambulatory Visit: Payer: Self-pay | Admitting: Interventional Cardiology

## 2021-11-11 ENCOUNTER — Encounter: Payer: Self-pay | Admitting: Neurology

## 2021-11-11 VITALS — BP 120/67 | HR 75 | Ht 65.0 in | Wt 219.6 lb

## 2021-11-11 DIAGNOSIS — Z952 Presence of prosthetic heart valve: Secondary | ICD-10-CM | POA: Diagnosis not present

## 2021-11-11 DIAGNOSIS — E119 Type 2 diabetes mellitus without complications: Secondary | ICD-10-CM | POA: Diagnosis not present

## 2021-11-11 DIAGNOSIS — R42 Dizziness and giddiness: Secondary | ICD-10-CM

## 2021-11-11 DIAGNOSIS — E785 Hyperlipidemia, unspecified: Secondary | ICD-10-CM

## 2021-11-11 DIAGNOSIS — I10 Essential (primary) hypertension: Secondary | ICD-10-CM

## 2021-11-11 DIAGNOSIS — I25708 Atherosclerosis of coronary artery bypass graft(s), unspecified, with other forms of angina pectoris: Secondary | ICD-10-CM

## 2021-11-11 MED ORDER — PREDNISOLONE ACETATE 1 % OP SUSP
OPHTHALMIC | 1 refills | Status: DC
  Filled 2021-11-11: qty 10, 30d supply, fill #0

## 2021-11-11 NOTE — Patient Instructions (Signed)
At this time, I cannot say for sure that you had a TIA.  But I would treat for secondary stroke prevention.  Continue medications as prescribed by cardiology.  Mediterranean diet.  Follow up as needed   Mediterranean Diet A Mediterranean diet refers to food and lifestyle choices that are based on the traditions of countries located on the [employer]. It focuses on eating more fruits, vegetables, whole grains, beans, nuts, seeds, and heart-healthy fats, and eating less dairy, meat, eggs, and processed foods with added sugar, salt, and fat. This way of eating has been shown to help prevent certain conditions and improve outcomes for people who have chronic diseases, like kidney disease and heart disease. What are tips for following this plan? Reading food labels Check the serving size of packaged foods. For foods such as rice and pasta, the serving size refers to the amount of cooked product, not dry. Check the total fat in packaged foods. Avoid foods that have saturated fat or trans fats. Check the ingredient list for added sugars, such as corn syrup. Shopping  Buy a variety of foods that offer a balanced diet, including: Fresh fruits and vegetables (produce). Grains, beans, nuts, and seeds. Some of these may be available in unpackaged forms or large amounts (in bulk). Fresh seafood. Poultry and eggs. Low-fat dairy products. Buy whole ingredients instead of prepackaged foods. Buy fresh fruits and vegetables in-season from local farmers markets. Buy plain frozen fruits and vegetables. If you do not have access to quality fresh seafood, buy precooked frozen shrimp or canned fish, such as tuna, salmon, or sardines. Stock your pantry so you always have certain foods on hand, such as olive oil, canned tuna, canned tomatoes, rice, pasta, and beans. Cooking Cook foods with extra-virgin olive oil instead of using butter or other vegetable oils. Have meat as a side dish, and have vegetables or  grains as your main dish. This means having meat in small portions or adding small amounts of meat to foods like pasta or stew. Use beans or vegetables instead of meat in common dishes like chili or lasagna. Experiment with different cooking methods. Try roasting, broiling, steaming, and sauting vegetables. Add frozen vegetables to soups, stews, pasta, or rice. Add nuts or seeds for added healthy fats and plant protein at each meal. You can add these to yogurt, salads, or vegetable dishes. Marinate fish or vegetables using olive oil, lemon juice, garlic, and fresh herbs. Meal planning Plan to eat one vegetarian meal one day each week. Try to work up to two vegetarian meals, if possible. Eat seafood two or more times a week. Have healthy snacks readily available, such as: Vegetable sticks with hummus. Greek yogurt. Fruit and nut trail mix. Eat balanced meals throughout the week. This includes: Fruit: 2-3 servings a day. Vegetables: 4-5 servings a day. Low-fat dairy: 2 servings a day. Fish, poultry, or lean meat: 1 serving a day. Beans and legumes: 2 or more servings a week. Nuts and seeds: 1-2 servings a day. Whole grains: 6-8 servings a day. Extra-virgin olive oil: 3-4 servings a day. Limit red meat and sweets to only a few servings a month. Lifestyle  Cook and eat meals together with your family, when possible. Drink enough fluid to keep your urine pale yellow. Be physically active every day. This includes: Aerobic exercise like running or swimming. Leisure activities like gardening, walking, or housework. Get 7-8 hours of sleep each night. If recommended by your health care provider, drink red wine in  moderation. This means 1 glass a day for nonpregnant women and 2 glasses a day for men. A glass of wine equals 5 oz (150 mL). What foods should I eat? Fruits Apples. Apricots. Avocado. Berries. Bananas. Cherries. Dates. Figs. Grapes. Lemons. Melon. Oranges. Peaches. Plums.  Pomegranate. Vegetables Artichokes. Beets. Broccoli. Cabbage. Carrots. Eggplant. Green beans. Chard. Kale. Spinach. Onions. Leeks. Peas. Squash. Tomatoes. Peppers. Radishes. Grains Whole-grain pasta. Brown rice. Bulgur wheat. Polenta. Couscous. Whole-wheat bread. [place]. Meats and other proteins Beans. Almonds. Sunflower seeds. Pine nuts. Peanuts. [place]. Salmon. Scallops. Shrimp. [place]. Tilapia. Clams. Oysters. Eggs. Poultry without skin. Dairy Low-fat milk. Cheese. Greek yogurt. Fats and oils Extra-virgin olive oil. Avocado oil. Grapeseed oil. Beverages Water. Red wine. Herbal tea. Sweets and desserts Greek yogurt with honey. Baked apples. Poached pears. Trail mix. Seasonings and condiments Basil. Cilantro. Coriander. Cumin. Mint. Parsley. Sage. Rosemary. Tarragon. Garlic. Oregano. Thyme. Pepper. Balsamic vinegar. Tahini. Hummus. Tomato sauce. Olives. Mushrooms. The items listed above may not be a complete list of foods and beverages you can eat. Contact a dietitian for more information. What foods should I limit? This is a list of foods that should be eaten rarely or only on special occasions. Fruits Fruit canned in syrup. Vegetables Deep-fried potatoes (french fries). Grains Prepackaged pasta or rice dishes. Prepackaged cereal with added sugar. Prepackaged snacks with added sugar. Meats and other proteins Beef. Pork. Lamb. Poultry with skin. Hot dogs. Berniece Salines. Dairy Ice cream. Sour cream. Whole milk. Fats and oils Butter. Canola oil. Vegetable oil. Beef fat (tallow). Lard. Beverages Juice. Sugar-sweetened soft drinks. Beer. Liquor and spirits. Sweets and desserts Cookies. Cakes. Pies. Candy. Seasonings and condiments Mayonnaise. Pre-made sauces and marinades. The items listed above may not be a complete list of foods and beverages you should limit. Contact a dietitian for more information. Summary The Mediterranean diet includes both food and lifestyle choices. Eat a  variety of fresh fruits and vegetables, beans, nuts, seeds, and whole grains. Limit the amount of red meat and sweets that you eat. If recommended by your health care provider, drink red wine in moderation. This means 1 glass a day for nonpregnant women and 2 glasses a day for men. A glass of wine equals 5 oz (150 mL). This information is not intended to replace advice given to you by your health care provider. Make sure you discuss any questions you have with your health care provider. Document Revised: 11/22/2019 Document Reviewed: 09/19/2019 Elsevier Patient Education  2022 [employer].

## 2021-11-12 ENCOUNTER — Encounter (HOSPITAL_COMMUNITY)
Admission: RE | Admit: 2021-11-12 | Discharge: 2021-11-12 | Disposition: A | Discharge status: Home / Self Care | Payer: 59 | Source: Ambulatory Visit | Attending: Interventional Cardiology | Admitting: Interventional Cardiology

## 2021-11-12 ENCOUNTER — Other Ambulatory Visit (HOSPITAL_COMMUNITY): Payer: Self-pay

## 2021-11-12 ENCOUNTER — Other Ambulatory Visit: Payer: Self-pay

## 2021-11-12 ENCOUNTER — Encounter (HOSPITAL_COMMUNITY): Payer: 59

## 2021-11-12 DIAGNOSIS — Z87891 Personal history of nicotine dependence: Secondary | ICD-10-CM | POA: Diagnosis not present

## 2021-11-12 DIAGNOSIS — Z952 Presence of prosthetic heart valve: Secondary | ICD-10-CM | POA: Diagnosis not present

## 2021-11-12 MED ORDER — ROSUVASTATIN CALCIUM 40 MG PO TABS
40.0000 mg | ORAL_TABLET | Freq: Every day | ORAL | 1 refills | Status: DC
  Filled 2021-11-12: qty 90, 90d supply, fill #0
  Filled 2022-04-03: qty 90, 90d supply, fill #1

## 2021-11-12 NOTE — Progress Notes (Signed)
Cardiac Individual Treatment Plan  Patient Details  Name: Deanna Simmons MRN: [id number] Date of Birth: 1951-01-24 Referring Provider:   Flowsheet Row CARDIAC REHAB PHASE II ORIENTATION from 09/28/2021 in [place]  Referring Provider Belva Crome, MD       Initial Encounter Date:  [place] PHASE II ORIENTATION from 09/28/2021 in [place]  Date 09/28/21       Visit Diagnosis: 07/13/21 TAVR  Patient's Home Medications on Admission:  Current Outpatient Medications:    acetaminophen (TYLENOL) 500 MG tablet, Take 1,000 mg by mouth every 6 (six) hours as needed for mild pain., Disp: , Rfl:    ADVAIR DISKUS 250-50 MCG/DOSE AEPB, INHALE 1 PUFF INTO THE LUNGS IN THE MORNING AND AT BEDTIME., Disp: 60 each, Rfl: 5   albuterol (VENTOLIN HFA) 108 (90 Base) MCG/ACT inhaler, INHALE 2 PUFFS BY MOUTH EVERY 4-6 HOURS AS NEEDED FOR COUGH OR WHEEZE, Disp: 18 g, Rfl: 1   ALPRAZolam (XANAX) 0.25 MG tablet, Take 1 tablet (0.25 mg total) by mouth 2 (two) times daily as needed for anxiety, Disp: 30 tablet, Rfl: 1   aspirin 81 MG tablet, Take 81 mg by mouth daily., Disp: , Rfl:    beclomethasone (QVAR REDIHALER) 80 MCG/ACT inhaler, Inhale 3 puffs 3 times daily during asthma flares. (Patient taking differently: 3 puffs 3 (three) times daily as needed (asthma flares).), Disp: 10.6 g, Rfl: 5   Blood Glucose Monitoring Suppl (FREESTYLE LITE) w/Device KIT, USE TO TEST TWO TIMES DAILY PRIOR TO MEALS, Disp: 1 kit, Rfl: 3   cetirizine (ZYRTEC) 10 MG tablet, Take 1 tablet (10 mg total) by mouth 2 (two) times daily as needed for allergies (Can use an extra dose during flares). (Patient taking differently: Take 10 mg by mouth in the morning.), Disp: 60 tablet, Rfl: 5   clopidogrel (PLAVIX) 75 MG tablet, Take 1 tablet (75 mg total) by mouth daily., Disp: 90 tablet, Rfl: 1   clotrimazole (CLOTRIMAZOLE ANTI-FUNGAL) 1 % cream, APPLY TO  THE AFFECTED AND SURROUNDING AREAS OF SKIN BY TOPICAL ROUTE 2 TIMES PER DAY IN THE MORNING AND EVENING, Disp: 45 g, Rfl: 1   dapagliflozin propanediol (FARXIGA) 10 MG TABS tablet, Take 1 tablet by mouth once a day, Disp: 90 tablet, Rfl: 3   Estradiol 10 MCG TABS vaginal tablet, INSERT 1 TABLET VAGINALLY TWICE A WEEK AS DIRECTED, Disp: 14 tablet, Rfl: 11   Estradiol 10 MCG TABS vaginal tablet, INSERT 1 TABLET TWICE A WEEK VAGINALLY AS DIRECTED, Disp: 24 tablet, Rfl: 3   Estradiol 10 MCG TABS vaginal tablet, INSERT 1 TABLET TWICE A WEEK VAGINALLY AS DIRECTED, Disp: 8 tablet, Rfl: 11   ezetimibe (ZETIA) 10 MG tablet, Take 1 tablet (10 mg total) by mouth daily., Disp: 90 tablet, Rfl: 3   famotidine (PEPCID) 40 MG tablet, Take 1 tablet (40 mg) by mouth 2 (two) times daily as needed for heartburn or indigestion (Can use an extra dose during flares). (Patient taking differently: Take 40 mg by mouth in the morning.), Disp: 60 tablet, Rfl: 5   ferrous sulfate 325 (65 FE) MG tablet, Take 325 mg by mouth daily., Disp: , Rfl:    fluticasone (FLONASE) 50 MCG/ACT nasal spray, Place 2 sprays into both nostrils daily. (Patient taking differently: Place 2 sprays into both nostrils daily as needed for allergies.), Disp: 48 g, Rfl: 1   fluticasone-salmeterol (ADVAIR) 250-50 MCG/ACT AEPB, Inhale 1 puff into the lungs  1-2 times per day depending on disease activity (must keep appointment for refills), Disp: 60 each, Rfl: 5   glucose blood test strip, USE WHEN CHECKING BLOOD SUGAR TWICE DAILY PRIOR TO MEALS, Disp: 200 strip, Rfl: 3   Lancets (FREESTYLE) lancets, USE TO TEST TWO TIMES DAILY PRIOR TO MEALS, Disp: 200 each, Rfl: 3   magnesium gluconate (MAGONATE) 500 MG tablet, Take 500 mg by mouth daily as needed (cramps)., Disp: , Rfl:    metoprolol succinate (TOPROL-XL) 25 MG 24 hr tablet, TAKE 1 TABLET BY MOUTH TWICE DAILY, Disp: 180 tablet, Rfl: 3   moxifloxacin (VIGAMOX) 0.5 % ophthalmic solution, Place 1 drop in left  eye 4 times a day starting 2 days prior to surgery and 2 drops morning of surgery, Disp: 3 mL, Rfl: 1   Multiple Vitamins-Minerals (MULTIVITAL PO), Take 1 tablet by mouth daily., Disp: , Rfl:    nitrofurantoin, macrocrystal-monohydrate, (MACROBID) 100 MG capsule, Take 1 capsule (100 mg total) by mouth 2 (two) times daily for 5 days (Patient not taking: Reported on 11/11/2021), Disp: 10 capsule, Rfl: 0   nitroGLYCERIN (NITROSTAT) 0.4 MG SL tablet, Place 1 tablet (0.4 mg total) under the tongue every 5 (five) minutes as needed for up to 25 days for chest pain., Disp: 25 tablet, Rfl: 3   prednisoLONE acetate (PRED FORTE) 1 % ophthalmic suspension, Instill 1 drop into both eyes twice daily, Disp: 10 mL, Rfl: 1   Probiotic Product (PROBIOTIC DAILY PO), Take 1 capsule by mouth at bedtime. Ultra Flora IB, Disp: , Rfl:    rosuvastatin (CRESTOR) 40 MG tablet, Take 1 tablet (40 mg total) by mouth daily., Disp: 90 tablet, Rfl: 1   valACYclovir (VALTREX) 500 MG tablet, TAKE 1 TABLET BY MOUTH TWICE DAILY AS NEEDED, Disp: 60 tablet, Rfl: 3   vitamin C (ASCORBIC ACID) 500 MG tablet, Take 500 mg by mouth daily., Disp: , Rfl:    Vitamin D-Vitamin K (VITAMIN K2-VITAMIN D3 PO), Take 1 tablet by mouth every other day., Disp: , Rfl:    zinc gluconate 50 MG tablet, Take 50 mg by mouth every evening., Disp: , Rfl:   Past Medical History: Past Medical History:  Diagnosis Date   Abnormal finding on Pap smear, ASCUS    Anemia    Asthma    Chondromalacia of knee    Coronary artery disease    Diabetes mellitus without complication ([place])    Diverticulum 12/11/2002   in cecum   Fibroids    h/o   GERD (gastroesophageal reflux disease)    H/O tinea cruris 02/28/2010   History of CVA (cerebrovascular accident)    Hypercholesteremia    Hypertension    Obesity    Pneumonia 07/01/2008   S/P TAVR (transcatheter aortic valve replacement) 07/13/2021   s/p TAVR w/ a 15m Edwards S3U via the TF approach by Dr. CBurt Knackand Dr.  BCyndia Bent   Synovitis of knee    Systemic lupus erythematosus ([place]    with nephritis   Vulvitis    h/o    Tobacco Use: Social History   Tobacco Use  Smoking Status Former  Smokeless Tobacco Never    Labs: Recent Review Flowsheet Data     Labs for ITP Cardiac and Pulmonary Rehab Latest Ref Rng & Units 06/11/2021 07/09/2021 07/13/2021 07/13/2021 07/13/2021   Cholestrol 0 - 200 mg/dL - - - - -   LDLCALC 0 - 99 mg/dL - - - - -   HDL >39.00 mg/dL - - - - -  Trlycerides 0.0 - 149.0 mg/dL - - - - -   Hemoglobin A1c 4.8 - 5.6 % - 6.0(H) - - -   PHART 7.350 - 7.450 7.411 7.406 7.353 - -   PCO2ART 32.0 - 48.0 mmHg 41.0 38.7 40.6 - -   HCO3 20.0 - 28.0 mmol/L 26.1 23.8 22.6 - -   TCO2 22 - 32 mmol/L 27 - _0 ACIDBASEDEF 0.0 - 2.0 mmol/L - 0.3 3.0(H) - -   O2SAT % 98.0 97.8 99.0 - -       Capillary Blood Glucose: Lab Results  Component Value Date   GLUCAP 127 (H) 10/08/2021   GLUCAP 134 (H) 10/08/2021   GLUCAP 122 (H) 10/06/2021   GLUCAP 158 (H) 10/06/2021   GLUCAP 106 (H) 10/04/2021     Exercise Target Goals: Exercise Program Goal: Individual exercise prescription set using results from initial 6 min walk test and THRR while considering  patients activity barriers and safety.   Exercise Prescription Goal: Starting with aerobic activity 30 plus minutes a day, 3 days per week for initial exercise prescription. Provide home exercise prescription and guidelines that participant acknowledges understanding prior to discharge.  Activity Barriers & Risk Stratification:  Activity Barriers & Cardiac Risk Stratification - 09/28/21 0851       Activity Barriers & Cardiac Risk Stratification   Activity Barriers History of Falls;Other (comment)    Comments Left knee "bone on bone", needs knee repalcement.    Cardiac Risk Stratification Moderate             6 Minute Walk:  6 Minute Walk     Row Name 09/28/21 0909         6 Minute Walk   Phase Initial     Distance  1337 feet     Walk Time 6 minutes     # of Rest Breaks 0     MPH 2.53     METS 2.45     RPE 11     Perceived Dyspnea  0     VO2 Peak 8.57     Symptoms No     Resting HR 68 bpm     Resting BP 104/70     Resting Oxygen Saturation  99 %     Exercise Oxygen Saturation  during 6 min walk 98 %     Max Ex. HR 105 bpm     Max Ex. BP 142/82     2 Minute Post BP 118/76              Oxygen Initial Assessment:   Oxygen Re-Evaluation:   Oxygen Discharge (Final Oxygen Re-Evaluation):   Initial Exercise Prescription:  Initial Exercise Prescription - 09/28/21 1000       Date of Initial Exercise RX and Referring Provider   Date 09/28/21    Referring Provider Belva Crome, MD    Expected Discharge Date 11/26/21      NuStep   Level 2    SPM 85    Minutes 15    METs 2      Track   Laps 13    Minutes 15    METs 2.51      Prescription Details   Frequency (times per week) 3    Duration Progress to 30 minutes of continuous aerobic without signs/symptoms of physical distress      Intensity   THRR 40-80% of Max Heartrate 60-119    Ratings of Perceived Exertion 11-13  Perceived Dyspnea 0-4      Progression   Progression Continue to progress workloads to maintain intensity without signs/symptoms of physical distress.      Resistance Training   Training Prescription Yes    Weight 2 lbs    Reps 10-15             Perform Capillary Blood Glucose checks as needed.  Exercise Prescription Changes:   Exercise Prescription Changes     Row Name 10/04/21 0830 10/15/21 0830 11/03/21 0830 11/12/21 1100       Response to Exercise   Blood Pressure (Admit) 104/60 120/70 104/60 104/62    Blood Pressure (Exercise) 148/72 128/70 122/62 128/70    Blood Pressure (Exit) 104/54 118/72 92/60 102/64    Heart Rate (Admit) 88 bpm 68 bpm 82 bpm 86 bpm    Heart Rate (Exercise) 114 bpm 100 bpm 106 bpm 99 bpm    Heart Rate (Exit) 74 bpm 72 bpm 82 bpm 86 bpm    Rating of Perceived  Exertion (Exercise) 11._0 Perceived Dyspnea (Exercise) 0 0 0 0    Symptoms 0 0 0 4/10 chronic left knee pain    Comments Pt first day in the CRP2 program Reviewed MET's, goals and home ExRx Reviewed MET's Reviewed MET's and Goals    Duration Progress to 30 minutes of  aerobic without signs/symptoms of physical distress Progress to 30 minutes of  aerobic without signs/symptoms of physical distress Progress to 30 minutes of  aerobic without signs/symptoms of physical distress Progress to 30 minutes of  aerobic without signs/symptoms of physical distress    Intensity THRR unchanged THRR unchanged THRR unchanged THRR unchanged      Progression   Progression Continue to progress workloads to maintain intensity without signs/symptoms of physical distress. Continue to progress workloads to maintain intensity without signs/symptoms of physical distress. Continue to progress workloads to maintain intensity without signs/symptoms of physical distress. Continue to progress workloads to maintain intensity without signs/symptoms of physical distress.    Average METs 2.21 2.37 2.52 2.82      Resistance Training   Training Prescription Yes Yes No Yes    Weight 2 lbs 2 lbs -- 2 lbs    Reps 10-15 10-15 -- 10-15    Time 10 Minutes 10 Minutes -- 10 Minutes      NuStep   Level _1 SPM 85 85 85 85    Minutes _2 METs 1.8 2 2.3 2.9      Track   Laps _3 Minutes _4 METs 2.62 2.74 2.74 2.74      Home Exercise Plan   Plans to continue exercise at -- Home (comment) Home (comment) Home (comment)    Frequency -- Add 2 additional days to program exercise sessions. Add 2 additional days to program exercise sessions. Add 2 additional days to program exercise sessions.    Initial Home Exercises Provided -- 10/15/21 10/15/21 10/15/21             Exercise Comments:   Exercise Comments     Row Name 10/04/21 0830 10/15/21 0830 11/03/21 0830 11/12/21 0830      Exercise Comments Pt first day in the CRP2 program. Pt is tolerating exercise well with an average MET level of 2.21. Pt is learning her THRR, RPE and ExRx. Pt is off to  a good start Reviewed MET's goals and home Ex Rx. Pt is tolerating exercise well with an average MET level of 2.37. Pt goal is wt loss, pt has not lost any wt yet, but she is still early on in the program. Encouraged healthy diet and Exercise at home. Pt will either walk, areobics or stairs for 1-2 days for 30 minutes Reviewed MET's with pt today. Pt is tolerating exercise well with an average MET level of 2.37. will continue to monitor pt and progress workloads as tolerated without sign or symptom Reviewed MET's and goals with pt today. Pt is tolerating exercise well with an average MET level of 2.82. Pt main goal was to lose wt, pt plans to gain momentum on her weight loss now that the holidays are over. Offered support and materials, but she stated she was ok and knew what to do with her diet             Exercise Goals and Review:   Exercise Goals     Row Name 09/28/21 0822             Exercise Goals   Increase Physical Activity Yes       Intervention Provide advice, education, support and counseling about physical activity/exercise needs.;Develop an individualized exercise prescription for aerobic and resistive training based on initial evaluation findings, risk stratification, comorbidities and participant's personal goals.       Expected Outcomes Short Term: Attend rehab on a regular basis to increase amount of physical activity.;Long Term: Exercising regularly at least 3-5 days a week.;Long Term: Add in home exercise to make exercise part of routine and to increase amount of physical activity.       Increase Strength and Stamina Yes       Intervention Provide advice, education, support and counseling about physical activity/exercise needs.;Develop an individualized exercise prescription for aerobic and resistive  training based on initial evaluation findings, risk stratification, comorbidities and participant's personal goals.       Expected Outcomes Short Term: Increase workloads from initial exercise prescription for resistance, speed, and METs.;Short Term: Perform resistance training exercises routinely during rehab and add in resistance training at home;Long Term: Improve cardiorespiratory fitness, muscular endurance and strength as measured by increased METs and functional capacity (6MWT)       Able to understand and use rate of perceived exertion (RPE) scale Yes       Intervention Provide education and explanation on how to use RPE scale       Expected Outcomes Short Term: Able to use RPE daily in rehab to express subjective intensity level;Long Term:  Able to use RPE to guide intensity level when exercising independently       Knowledge and understanding of Target Heart Rate Range (THRR) Yes       Intervention Provide education and explanation of THRR including how the numbers were predicted and where they are located for reference       Expected Outcomes Short Term: Able to state/look up THRR;Long Term: Able to use THRR to govern intensity when exercising independently;Short Term: Able to use daily as guideline for intensity in rehab       Able to check pulse independently Yes       Intervention Provide education and demonstration on how to check pulse in carotid and radial arteries.;Review the importance of being able to check your own pulse for safety during independent exercise       Expected Outcomes Short Term: Able to  explain why pulse checking is important during independent exercise;Long Term: Able to check pulse independently and accurately       Understanding of Exercise Prescription Yes       Intervention Provide education, explanation, and written materials on patient's individual exercise prescription       Expected Outcomes Short Term: Able to explain program exercise prescription;Long  Term: Able to explain home exercise prescription to exercise independently                Exercise Goals Re-Evaluation :  Exercise Goals Re-Evaluation     Row Name 10/04/21 0830 10/15/21 0830 11/03/21 1653 11/12/21 0830       Exercise Goal Re-Evaluation   Exercise Goals Review Increase Physical Activity;Increase Strength and Stamina;Able to understand and use rate of perceived exertion (RPE) scale;Knowledge and understanding of Target Heart Rate Range (THRR);Understanding of Exercise Prescription Increase Physical Activity;Increase Strength and Stamina;Able to understand and use rate of perceived exertion (RPE) scale;Knowledge and understanding of Target Heart Rate Range (THRR);Understanding of Exercise Prescription Increase Physical Activity;Increase Strength and Stamina;Able to understand and use rate of perceived exertion (RPE) scale;Knowledge and understanding of Target Heart Rate Range (THRR);Understanding of Exercise Prescription Increase Physical Activity;Increase Strength and Stamina;Able to understand and use rate of perceived exertion (RPE) scale;Knowledge and understanding of Target Heart Rate Range (THRR);Understanding of Exercise Prescription    Comments Pt first day in the CRP2 program. Pt is tolerating exercise well with an average MET level of 2.21. Pt is learning her THRR, RPE and ExRx. Reviewed MET's goals and home Ex Rx. Pt is tolerating exercise well with an average MET level of 2.37. Pt goal is wt loss, pt has not lost any wt yet, but she is still early on in the program. Encouraged healthy diet and Exercise at home. Pt will either walk, areobics or stairs for 1-2 days for 30 minutes Reviewed MET's with pt today. Pt is tolerating exercise well with an average MET level of 2.37. Reviewed MET's and goals  with pt today. Pt is tolerating exercise well with an average MET level of 2.82. Pt main goal was to lose wt, pt plans to gain momentum on her weight loss now that the holidays  are over. Offered support and materials, but she stated she was ok and knew what to do with her diet    Expected Outcomes Will continue to monitor pt and progress workloads as tolerated without sign or symptom Pt will continue to exercise on her own at home. Will continue to monitor pt and progress workloads as tolerated without sign or symptom Will continue to monitor pt and progress workloads as tolerated without sign or symptom Will continue to monitor pt and progress workloads as tolerated without sign or symptom              Discharge Exercise Prescription (Final Exercise Prescription Changes):  Exercise Prescription Changes - 11/12/21 1100       Response to Exercise   Blood Pressure (Admit) 104/62    Blood Pressure (Exercise) 128/70    Blood Pressure (Exit) 102/64    Heart Rate (Admit) 86 bpm    Heart Rate (Exercise) 99 bpm    Heart Rate (Exit) 86 bpm    Rating of Perceived Exertion (Exercise) 11    Perceived Dyspnea (Exercise) 0    Symptoms 4/10 chronic left knee pain    Comments Reviewed MET's and Goals    Duration Progress to 30 minutes of  aerobic without signs/symptoms of physical  distress    Intensity THRR unchanged      Progression   Progression Continue to progress workloads to maintain intensity without signs/symptoms of physical distress.    Average METs 2.82      Resistance Training   Training Prescription Yes    Weight 2 lbs    Reps 10-15    Time 10 Minutes      NuStep   Level 3    SPM 85    Minutes 15    METs 2.9      Track   Laps 15    Minutes 15    METs 2.74      Home Exercise Plan   Plans to continue exercise at Home (comment)    Frequency Add 2 additional days to program exercise sessions.    Initial Home Exercises Provided 10/15/21             Nutrition:  Target Goals: Understanding of nutrition guidelines, daily intake of sodium <1516m, cholesterol <205m calories 30% from fat and 7% or less from saturated fats, daily to have 5  or more servings of fruits and vegetables.  Biometrics:  Pre Biometrics - 09/28/21 0821       Pre Biometrics   Waist Circumference 40.5 inches    Hip Circumference 52.5 inches    Waist to Hip Ratio 0.77 %    Triceps Skinfold 36 mm    % Body Fat 47.9 %    Grip Strength 24 kg    Flexibility 14.5 in    Single Leg Stand 7.93 seconds              Nutrition Therapy Plan and Nutrition Goals:   Nutrition Assessments:  MEDIFICTS Score Key: ?70 Need to make dietary changes  40-70 Heart Healthy Diet ? 40 Therapeutic Level Cholesterol Diet   Picture Your Plate Scores: <4<24nhealthy dietary pattern with much room for improvement. 41-50 Dietary pattern unlikely to meet recommendations for good health and room for improvement. 51-60 More healthful dietary pattern, with some room for improvement.  >60 Healthy dietary pattern, although there may be some specific behaviors that could be improved.    Nutrition Goals Re-Evaluation:   Nutrition Goals Discharge (Final Nutrition Goals Re-Evaluation):   Psychosocial: Target Goals: Acknowledge presence or absence of significant depression and/or stress, maximize coping skills, provide positive support system. Participant is able to verbalize types and ability to use techniques and skills needed for reducing stress and depression.  Initial Review & Psychosocial Screening:  Initial Psych Review & Screening - 09/28/21 1347       Initial Review   Current issues with None Identified      Family Dynamics   Good Support System? Yes   Deanna Simmons lives with her husband  and has her 4 children for support     Barriers   Psychosocial barriers to participate in program There are no identifiable barriers or psychosocial needs.      Screening Interventions   Interventions Encouraged to exercise             Quality of Life Scores:  Quality of Life - 09/28/21 1038       Quality of Life   Select Quality of Life      Quality of Life  Scores   Health/Function Pre 27.86 %    Socioeconomic Pre 26.5 %    Psych/Spiritual Pre 29.14 %    Family Pre 22.8 %    GLOBAL Pre 27.08 %  Scores of 19 and below usually indicate a poorer quality of life in these areas.  A difference of  2-3 points is a clinically meaningful difference.  A difference of 2-3 points in the total score of the Quality of Life Index has been associated with significant improvement in overall quality of life, self-image, physical symptoms, and general health in studies assessing change in quality of life.  PHQ-9: Recent Review Flowsheet Data     Depression screen [facility] 2/9 09/28/2021 08/31/2016 06/22/2016 01/29/2016 12/01/2015   Decreased Interest 0 0 0 0 0   Down, Depressed, Hopeless 0 0 0 0 0   PHQ - 2 Score 0 0 0 0 0      Interpretation of Total Score  Total Score Depression Severity:  1-4 = Minimal depression, 5-9 = Mild depression, 10-14 = Moderate depression, 15-19 = Moderately severe depression, 20-27 = Severe depression   Psychosocial Evaluation and Intervention:   Psychosocial Re-Evaluation:  Psychosocial Re-Evaluation     [place] Name 10/20/21 1719 11/11/21 1549           Psychosocial Re-Evaluation   Current issues with None Identified None Identified      Interventions Encouraged to attend Cardiac Rehabilitation for the exercise Encouraged to attend Cardiac Rehabilitation for the exercise      Continue Psychosocial Services  No Follow up required No Follow up required               Psychosocial Discharge (Final Psychosocial Re-Evaluation):  Psychosocial Re-Evaluation - 11/11/21 1549       Psychosocial Re-Evaluation   Current issues with None Identified    Interventions Encouraged to attend Cardiac Rehabilitation for the exercise    Continue Psychosocial Services  No Follow up required             Vocational Rehabilitation: Provide vocational rehab assistance to qualifying candidates.   Vocational Rehab  Evaluation & Intervention:  Vocational Rehab - 09/28/21 1350       Initial Vocational Rehab Evaluation & Intervention   Assessment shows need for Vocational Rehabilitation No   Corynn works full time and does not need vocational rehab at this time.            Education: Education Goals: Education classes will be provided on a weekly basis, covering required topics. Participant will state understanding/return demonstration of topics presented.  Learning Barriers/Preferences:  Learning Barriers/Preferences - 09/28/21 1349       Learning Barriers/Preferences   Learning Barriers Sight   Wears glasses   Learning Preferences Computer/Internet;Pictoral;Skilled Demonstration             Education Topics: Hypertension, Hypertension Reduction -Define heart disease and high blood pressure. Discus how high blood pressure affects the body and ways to reduce high blood pressure.   Exercise and Your Heart -Discuss why it is important to exercise, the FITT principles of exercise, normal and abnormal responses to exercise, and how to exercise safely.   Angina -Discuss definition of angina, causes of angina, treatment of angina, and how to decrease risk of having angina.   Cardiac Medications -Review what the following cardiac medications are used for, how they affect the body, and side effects that may occur when taking the medications.  Medications include Aspirin, Beta blockers, calcium channel blockers, ACE Inhibitors, angiotensin receptor blockers, diuretics, digoxin, and antihyperlipidemics.   Congestive Heart Failure -Discuss the definition of CHF, how to live with CHF, the signs and symptoms of CHF, and how keep track of weight and sodium  intake.   Heart Disease and Intimacy -Discus the effect sexual activity has on the heart, how changes occur during intimacy as we age, and safety during sexual activity.   Smoking Cessation / COPD -Discuss different methods to quit  smoking, the health benefits of quitting smoking, and the definition of COPD.   Nutrition I: Fats -Discuss the types of cholesterol, what cholesterol does to the heart, and how cholesterol levels can be controlled.   Nutrition II: Labels -Discuss the different components of food labels and how to read food label   Heart Parts/Heart Disease and PAD -Discuss the anatomy of the heart, the pathway of blood circulation through the heart, and these are affected by heart disease.   Stress I: Signs and Symptoms -Discuss the causes of stress, how stress may lead to anxiety and depression, and ways to limit stress.   Stress II: Relaxation -Discuss different types of relaxation techniques to limit stress.   Warning Signs of Stroke / TIA -Discuss definition of a stroke, what the signs and symptoms are of a stroke, and how to identify when someone is having stroke.   Knowledge Questionnaire Score:  Knowledge Questionnaire Score - 09/28/21 1034       Knowledge Questionnaire Score   Pre Score 22/24             Core Components/Risk Factors/Patient Goals at Admission:  Personal Goals and Risk Factors at Admission - 09/28/21 0824       Core Components/Risk Factors/Patient Goals on Admission    Weight Management Yes;Obesity    Intervention Weight Management/Obesity: Establish reasonable short term and long term weight goals.;Obesity: Provide education and appropriate resources to help participant work on and attain dietary goals.    Admit Weight 217 lb 13 oz (98.8 kg)    Expected Outcomes Short Term: Continue to assess and modify interventions until short term weight is achieved;Long Term: Adherence to nutrition and physical activity/exercise program aimed toward attainment of established weight goal;Weight Loss: Understanding of general recommendations for a balanced deficit meal plan, which promotes 1-2 lb weight loss per week and includes a negative energy balance of [phone] kcal/d     Diabetes Yes    Intervention Provide education about signs/symptoms and action to take for hypo/hyperglycemia.;Provide education about proper nutrition, including hydration, and aerobic/resistive exercise prescription along with prescribed medications to achieve blood glucose in normal ranges: Fasting glucose 65-99 mg/dL    Expected Outcomes Short Term: Participant verbalizes understanding of the signs/symptoms and immediate care of hyper/hypoglycemia, proper foot care and importance of medication, aerobic/resistive exercise and nutrition plan for blood glucose control.;Long Term: Attainment of HbA1C < 7%.    Hypertension Yes    Intervention Provide education on lifestyle modifcations including regular physical activity/exercise, weight management, moderate sodium restriction and increased consumption of fresh fruit, vegetables, and low fat dairy, alcohol moderation, and smoking cessation.;Monitor prescription use compliance.    Expected Outcomes Short Term: Continued assessment and intervention until BP is < 140/80m HG in hypertensive participants. < 130/814mHG in hypertensive participants with diabetes, heart failure or chronic kidney disease.;Long Term: Maintenance of blood pressure at goal levels.    Lipids Yes    Intervention Provide education and support for participant on nutrition & aerobic/resistive exercise along with prescribed medications to achieve LDL <7054mHDL >46m60m  Expected Outcomes Long Term: Cholesterol controlled with medications as prescribed, with individualized exercise RX and with personalized nutrition plan. Value goals: LDL < 70mg25mL > 40 mg.;Short Term: Participant states  understanding of desired cholesterol values and is compliant with medications prescribed. Participant is following exercise prescription and nutrition guidelines.             Core Components/Risk Factors/Patient Goals Review:   Goals and Risk Factor Review     Row Name 10/20/21 1720 11/11/21  1550           Core Components/Risk Factors/Patient Goals Review   Personal Goals Review Weight Management/Obesity;Diabetes;Hypertension;Lipids Weight Management/Obesity;Diabetes;Hypertension;Lipids      Review Deanna Simmons has been doing well with exercise at phase 2 cardiac rehab. Vital sign and CBg's have been stable. no further ventricular ectopy noted Deanna Simmons continues to do well with exercise at phase 2 cardiac rehab. Vital sign and CBG's have been stable.      Expected Outcomes Deanna Simmons will contiue to participate in phase 2 for exercise, nutrition and lifestyle modifications Deanna Simmons will contiue to participate in phase 2 for exercise, nutrition and lifestyle modifications               Core Components/Risk Factors/Patient Goals at Discharge (Final Review):   Goals and Risk Factor Review - 11/11/21 1550       Core Components/Risk Factors/Patient Goals Review   Personal Goals Review Weight Management/Obesity;Diabetes;Hypertension;Lipids    Review Deanna Simmons continues to do well with exercise at phase 2 cardiac rehab. Vital sign and CBG's have been stable.    Expected Outcomes Deanna Simmons will contiue to participate in phase 2 for exercise, nutrition and lifestyle modifications             ITP Comments:  ITP Comments     Row Name 09/28/21 0821 10/20/21 1718 11/11/21 1545       ITP Comments Medical Director- Dr. Fransico Him, MD 30 Day ITP Review. Deanna Simmons has good attendance and participation in phase 2 cardiac rehab. 30 Day ITP Review. Deanna Simmons continues to have  good attendance and participation in phase 2 cardiac rehab.              Comments: See ITP comments.Harrell Gave RN BSN

## 2021-11-15 ENCOUNTER — Encounter (HOSPITAL_COMMUNITY): Payer: 59

## 2021-11-17 ENCOUNTER — Other Ambulatory Visit: Payer: Self-pay

## 2021-11-17 ENCOUNTER — Encounter (HOSPITAL_COMMUNITY): Payer: 59

## 2021-11-17 ENCOUNTER — Encounter (HOSPITAL_COMMUNITY)
Admission: RE | Admit: 2021-11-17 | Discharge: 2021-11-17 | Disposition: A | Discharge status: Home / Self Care | Payer: 59 | Source: Ambulatory Visit | Attending: Interventional Cardiology | Admitting: Interventional Cardiology

## 2021-11-17 DIAGNOSIS — Z952 Presence of prosthetic heart valve: Secondary | ICD-10-CM | POA: Diagnosis not present

## 2021-11-17 DIAGNOSIS — Z87891 Personal history of nicotine dependence: Secondary | ICD-10-CM | POA: Diagnosis not present

## 2021-11-19 ENCOUNTER — Encounter (HOSPITAL_COMMUNITY)
Admission: RE | Admit: 2021-11-19 | Discharge: 2021-11-19 | Disposition: A | Discharge status: Home / Self Care | Payer: 59 | Source: Ambulatory Visit | Attending: Interventional Cardiology | Admitting: Interventional Cardiology

## 2021-11-19 ENCOUNTER — Other Ambulatory Visit: Payer: Self-pay

## 2021-11-19 ENCOUNTER — Encounter (HOSPITAL_COMMUNITY): Payer: 59

## 2021-11-19 DIAGNOSIS — Z952 Presence of prosthetic heart valve: Secondary | ICD-10-CM | POA: Diagnosis not present

## 2021-11-19 DIAGNOSIS — Z87891 Personal history of nicotine dependence: Secondary | ICD-10-CM | POA: Diagnosis not present

## 2021-11-22 ENCOUNTER — Encounter (HOSPITAL_COMMUNITY)
Admission: RE | Admit: 2021-11-22 | Discharge: 2021-11-22 | Disposition: A | Discharge status: Home / Self Care | Payer: 59 | Source: Ambulatory Visit | Attending: Interventional Cardiology | Admitting: Interventional Cardiology

## 2021-11-22 ENCOUNTER — Encounter (HOSPITAL_COMMUNITY): Payer: 59

## 2021-11-22 ENCOUNTER — Other Ambulatory Visit: Payer: Self-pay

## 2021-11-22 DIAGNOSIS — Z952 Presence of prosthetic heart valve: Secondary | ICD-10-CM | POA: Diagnosis not present

## 2021-11-22 DIAGNOSIS — Z87891 Personal history of nicotine dependence: Secondary | ICD-10-CM | POA: Diagnosis not present

## 2021-11-24 ENCOUNTER — Encounter (HOSPITAL_COMMUNITY): Payer: 59

## 2021-11-24 ENCOUNTER — Encounter (HOSPITAL_COMMUNITY)
Admission: RE | Admit: 2021-11-24 | Discharge: 2021-11-24 | Disposition: A | Discharge status: Home / Self Care | Payer: 59 | Source: Ambulatory Visit | Attending: Interventional Cardiology | Admitting: Interventional Cardiology

## 2021-11-24 ENCOUNTER — Other Ambulatory Visit: Payer: Self-pay

## 2021-11-24 VITALS — Ht 63.75 in | Wt 219.8 lb

## 2021-11-24 DIAGNOSIS — Z952 Presence of prosthetic heart valve: Secondary | ICD-10-CM

## 2021-11-24 DIAGNOSIS — Z87891 Personal history of nicotine dependence: Secondary | ICD-10-CM | POA: Diagnosis not present

## 2021-11-26 ENCOUNTER — Encounter (HOSPITAL_COMMUNITY)
Admission: RE | Admit: 2021-11-26 | Discharge: 2021-11-26 | Disposition: A | Discharge status: Home / Self Care | Payer: 59 | Source: Ambulatory Visit | Attending: Interventional Cardiology | Admitting: Interventional Cardiology

## 2021-11-26 ENCOUNTER — Other Ambulatory Visit: Payer: Self-pay

## 2021-11-26 ENCOUNTER — Encounter (HOSPITAL_COMMUNITY): Payer: 59

## 2021-11-26 DIAGNOSIS — Z87891 Personal history of nicotine dependence: Secondary | ICD-10-CM | POA: Diagnosis not present

## 2021-11-26 DIAGNOSIS — Z952 Presence of prosthetic heart valve: Secondary | ICD-10-CM

## 2021-11-29 NOTE — Progress Notes (Signed)
Discharge Progress Report  Patient Details  Name: Deanna Simmons MRN: [id number] Date of Birth: Jun 29, 1951 Referring Provider:   Flowsheet Row CARDIAC REHAB PHASE II ORIENTATION from 09/28/2021 in [facility]  Referring Provider Belva Crome, MD        Number of Visits: 19  Reason for Discharge:  Patient reached a stable level of exercise. Patient has met program and personal goals.  Smoking History:  Social History   Tobacco Use  Smoking Status Former  Smokeless Tobacco Never    Diagnosis:  07/13/21 TAVR  ADL [facility]:   Initial Exercise Prescription:  Initial Exercise Prescription - 09/28/21 1000       Date of Initial Exercise RX and Referring Provider   Date 09/28/21    Referring Provider Belva Crome, MD    Expected Discharge Date 11/26/21      NuStep   Level 2    SPM 85    Minutes 15    METs 2      Track   Laps 13    Minutes 15    METs 2.51      Prescription Details   Frequency (times per week) 3    Duration Progress to 30 minutes of continuous aerobic without signs/symptoms of physical distress      Intensity   THRR 40-80% of Max Heartrate 60-119    Ratings of Perceived Exertion 11-13    Perceived Dyspnea 0-4      Progression   Progression Continue to progress workloads to maintain intensity without signs/symptoms of physical distress.      Resistance Training   Training Prescription Yes    Weight 2 lbs    Reps 10-15             Discharge Exercise Prescription (Final Exercise Prescription Changes):  Exercise Prescription Changes - 11/26/21 0830       Response to Exercise   Blood Pressure (Admit) 104/62    Blood Pressure (Exercise) 122/70    Blood Pressure (Exit) 115/75    Heart Rate (Admit) 81 bpm    Heart Rate (Exercise) 99 bpm    Heart Rate (Exit) 69 bpm    Rating of Perceived Exertion (Exercise) 12    Perceived Dyspnea (Exercise) 0    Comments Pt graduated the CRP2 program    Duration  Progress to 30 minutes of  aerobic without signs/symptoms of physical distress    Intensity THRR unchanged      Progression   Progression Continue to progress workloads to maintain intensity without signs/symptoms of physical distress.    Average METs 2.63      Resistance Training   Training Prescription Yes    Weight 2 Lbs    Reps 10-15    Time 10 Minutes      NuStep   Level 3    SPM 85    Minutes 15    METs 2.4      Track   Laps 16    Minutes 15    METs 2.86      Home Exercise Plan   Plans to continue exercise at Home (comment)    Frequency Add 2 additional days to program exercise sessions.    Initial Home Exercises Provided 10/15/21             Functional Capacity:  6 Minute Walk     Row Name 09/28/21 0909 11/24/21 0830       6 Minute Walk   Phase  Initial Discharge    Distance 1337 feet 1101 feet    Distance % Change -- -17.65 %    Distance Feet Change -- 236 ft    Walk Time 6 minutes 6 minutes    # of Rest Breaks 0 0    MPH 2.53 2.09    METS 2.45 1.88    RPE 11 13    Perceived Dyspnea  0 0    VO2 Peak 8.57 6.57    Symptoms No Yes (comment)    Comments -- 5/10 chronic Left knee pain.    Resting HR 68 bpm 85 bpm    Resting BP 104/70 110/60    Resting Oxygen Saturation  99 % 99 %    Exercise Oxygen Saturation  during 6 min walk 98 % 97 %    Max Ex. HR 105 bpm 103 bpm    Max Ex. BP 142/82 126/62    2 Minute Post BP 118/76 104/60             Psychological, QOL, Others - Outcomes: PHQ 2/9: Depression screen [facility] 2/9 11/29/2021 11/26/2021 09/28/2021 08/31/2016 06/22/2016  Decreased Interest 0 0 0 0 0  Down, Depressed, Hopeless 0 0 0 0 0  PHQ - 2 Score 0 0 0 0 0  Altered sleeping - 2 - - -  Tired, decreased energy - 1 - - -  Change in appetite - 0 - - -  Feeling bad or failure about yourself  - 0 - - -  Trouble concentrating - 0 - - -  Moving slowly or fidgety/restless - 0 - - -  Suicidal thoughts - 0 - - -  PHQ-9 Score - 3 - - -  Difficult  doing work/chores - Not difficult at all - - -  Some recent data might be hidden    Quality of Life:  Quality of Life - 11/26/21 0830       Quality of Life   Select Quality of Life      Quality of Life Scores   Health/Function Post 23.46 %    Socioeconomic Post 22.79 %    Psych/Spiritual Post 27.43 %    Family Post 21.6 %    GLOBAL Post 23.88 %             Personal Goals: Goals established at orientation with interventions provided to work toward goal.  Personal Goals and Risk Factors at Admission - 09/28/21 0824       Core Components/Risk Factors/Patient Goals on Admission    Weight Management Yes;Obesity    Intervention Weight Management/Obesity: Establish reasonable short term and long term weight goals.;Obesity: Provide education and appropriate resources to help participant work on and attain dietary goals.    Admit Weight 217 lb 13 oz (98.8 kg)    Expected Outcomes Short Term: Continue to assess and modify interventions until short term weight is achieved;Long Term: Adherence to nutrition and physical activity/exercise program aimed toward attainment of established weight goal;Weight Loss: Understanding of general recommendations for a balanced deficit meal plan, which promotes 1-2 lb weight loss per week and includes a negative energy balance of [phone] kcal/d    Diabetes Yes    Intervention Provide education about signs/symptoms and action to take for hypo/hyperglycemia.;Provide education about proper nutrition, including hydration, and aerobic/resistive exercise prescription along with prescribed medications to achieve blood glucose in normal ranges: Fasting glucose 65-99 mg/dL    Expected Outcomes Short Term: Participant verbalizes understanding of the signs/symptoms and immediate care  of hyper/hypoglycemia, proper foot care and importance of medication, aerobic/resistive exercise and nutrition plan for blood glucose control.;Long Term: Attainment of HbA1C < 7%.     Hypertension Yes    Intervention Provide education on lifestyle modifcations including regular physical activity/exercise, weight management, moderate sodium restriction and increased consumption of fresh fruit, vegetables, and low fat dairy, alcohol moderation, and smoking cessation.;Monitor prescription use compliance.    Expected Outcomes Short Term: Continued assessment and intervention until BP is < 140/38m HG in hypertensive participants. < 130/860mHG in hypertensive participants with diabetes, heart failure or chronic kidney disease.;Long Term: Maintenance of blood pressure at goal levels.    Lipids Yes    Intervention Provide education and support for participant on nutrition & aerobic/resistive exercise along with prescribed medications to achieve LDL <7038mHDL >52m28m  Expected Outcomes Long Term: Cholesterol controlled with medications as prescribed, with individualized exercise RX and with personalized nutrition plan. Value goals: LDL < 70mg29mL > 40 mg.;Short Term: Participant states understanding of desired cholesterol values and is compliant with medications prescribed. Participant is following exercise prescription and nutrition guidelines.              Personal Goals Discharge:  Goals and Risk Factor Review     Row Name 10/20/21 1720 11/11/21 1550 11/26/21 0900         Core Components/Risk Factors/Patient Goals Review   Personal Goals Review Weight Management/Obesity;Diabetes;Hypertension;Lipids Weight Management/Obesity;Diabetes;Hypertension;Lipids Weight Management/Obesity;Diabetes;Hypertension;Lipids     Review OliieGelene Minkbeen doing well with exercise at phase 2 cardiac rehab. Vital sign and CBg's have been stable. no further ventricular ectopy noted Oliie continues to do well with exercise at phase 2 cardiac rehab. Vital sign and CBG's have been stable. Marylyn graduates today from cardiac rehab phase 2 with the completion of 19 exercise sessions. Sennie vital signs and  CBG remain stable and well managed. Showed increase in MET averages. OllieNonnaorking toward her weight loss goal as evident by decrease in waist circumference and triceps skinfold. Overall Naileah reports she feels better since participating in exercise here in rehab as well as her home exercise.     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              Exercise Goals and Review:  Exercise Goals     Row Name 09/28/21 0822 [id number]        Exercise Goals   Increase Physical Activity Yes       Intervention Provide advice, education, support and counseling about physical activity/exercise needs.;Develop an individualized exercise prescription for aerobic and resistive training based on initial evaluation findings, risk stratification, comorbidities and participant's personal goals.       Expected Outcomes Short Term: Attend rehab on a regular basis to increase amount of physical activity.;Long Term: Exercising regularly at least 3-5 days a week.;Long Term: Add in home exercise to make exercise part of routine and to increase amount of physical activity.       Increase Strength and Stamina Yes       Intervention Provide advice, education, support and counseling about physical activity/exercise needs.;Develop an individualized exercise prescription for aerobic and resistive training based on initial evaluation findings, risk stratification, comorbidities and participant's personal  goals.       Expected Outcomes Short Term: Increase workloads from initial exercise prescription for resistance, speed, and METs.;Short Term: Perform resistance training exercises routinely during rehab and add in resistance training at home;Long Term: Improve  cardiorespiratory fitness, muscular endurance and strength as measured by increased METs and functional capacity (6MWT)       Able to understand and use rate of perceived exertion (RPE) scale Yes       Intervention Provide education and explanation on how to use RPE scale       Expected Outcomes Short Term: Able to use RPE daily in rehab to express subjective intensity level;Long Term:  Able to use RPE to guide intensity level when exercising independently       Knowledge and understanding of Target Heart Rate Range (THRR) Yes       Intervention Provide education and explanation of THRR including how the numbers were predicted and where they are located for reference       Expected Outcomes Short Term: Able to state/look up THRR;Long Term: Able to use THRR to govern intensity when exercising independently;Short Term: Able to use daily as guideline for intensity in rehab       Able to check pulse independently Yes       Intervention Provide education and demonstration on how to check pulse in carotid and radial arteries.;Review the importance of being able to check your own pulse for safety during independent exercise       Expected Outcomes Short Term: Able to explain why pulse checking is important during independent exercise;Long Term: Able to check pulse independently and accurately       Understanding of Exercise Prescription Yes       Intervention Provide education, explanation, and written materials on patient's individual exercise prescription       Expected Outcomes Short Term: Able to explain program exercise prescription;Long Term: Able to explain home exercise prescription to exercise independently                Exercise Goals Re-Evaluation:  Exercise Goals Re-Evaluation     Row Name 10/04/21 0830 10/15/21 0830 11/03/21 1653 11/12/21 0830 11/26/21 0830     Exercise Goal Re-Evaluation   Exercise Goals Review Increase Physical Activity;Increase Strength and Stamina;Able to  understand and use rate of perceived exertion (RPE) scale;Knowledge and understanding of Target Heart Rate Range (THRR);Understanding of Exercise Prescription Increase Physical Activity;Increase Strength and Stamina;Able to understand and use rate of perceived exertion (RPE) scale;Knowledge and understanding of Target Heart Rate Range (THRR);Understanding of Exercise Prescription Increase Physical Activity;Increase Strength and Stamina;Able to understand and use rate of perceived exertion (RPE) scale;Knowledge and understanding of Target Heart Rate Range (THRR);Understanding of Exercise Prescription Increase Physical Activity;Increase Strength and Stamina;Able to understand and use rate of perceived exertion (RPE) scale;Knowledge and understanding of Target Heart Rate Range (THRR);Understanding of Exercise Prescription Increase Physical Activity;Increase Strength and Stamina;Able to understand and use rate of perceived exertion (RPE) scale;Knowledge and understanding of Target Heart Rate Range (THRR);Understanding of Exercise Prescription   Comments Pt first day in the CRP2 program. Pt is tolerating exercise well with an average MET level of 2.21. Pt is learning her THRR, RPE and ExRx. Reviewed MET's goals and home Ex Rx. Pt is tolerating exercise well with an average MET level of 2.37. Pt goal is wt loss, pt has not lost any wt yet, but she is still early on in the program. Encouraged healthy diet and Exercise at home. Pt  will either walk, areobics or stairs for 1-2 days for 30 minutes Reviewed MET's with pt today. Pt is tolerating exercise well with an average MET level of 2.37. Reviewed MET's and goals  with pt today. Pt is tolerating exercise well with an average MET level of 2.82. Pt main goal was to lose wt, pt plans to gain momentum on her weight loss now that the holidays are over. Offered support and materials, but she stated she was ok and knew what to do with her diet Pt Graduated the CRP2 program Today.  Pt tolerated exercise well and had an average MET level of 2.63. Pt will continue to exercise on her own 3 days a week for 30 minutes by walking, aerobics, stairs and going to the [facility] (as chronic knee pain allows).   Expected Outcomes Will continue to monitor pt and progress workloads as tolerated without sign or symptom Pt will continue to exercise on her own at home. Will continue to monitor pt and progress workloads as tolerated without sign or symptom Will continue to monitor pt and progress workloads as tolerated without sign or symptom Will continue to monitor pt and progress workloads as tolerated without sign or symptom Pt will continue to exercise on her own and gain strength.            Nutrition & Weight - Outcomes:  Pre Biometrics - 11/24/21 0830       Pre Biometrics   Height 5' 3.75" (1.619 m)    Weight 99.7 kg    Waist Circumference 46 inches    Hip Circumference 53.7 inches    Waist to Hip Ratio 0.86 %    BMI (Calculated) 38.04    Triceps Skinfold 28 mm    % Body Fat 48.7 %    Grip Strength 25 kg    Flexibility 15.75 in    Single Leg Stand 19.2 seconds              Nutrition:   Nutrition Discharge:   Education Questionnaire Score:  Knowledge Questionnaire Score - 11/26/21 1215       Knowledge Questionnaire Score   Post Score 20/24             Goals reviewed with patient; copy given to patient.Pt graduated from cardiac rehab program on 11/26/21 with completion of 19 exercise sessions in Phase II. Pt maintained good attendance and progressed nicely during her participation in rehab as evidenced by increased MET level.   Medication list reconciled. Repeat  PHQ score-  .  Pt has made significant lifestyle changes and should be commended for her success. Pt feels she has achieved her goals during cardiac rehab.   Pt plans to continue exercise in the hospital gym 4-5 days a week. Rubie increased her distance on her post exercise walk test by  236 feet. We are  proud of Caylie's progress!Barnet Pall, RN,BSN 11/29/2021 1:55 PM

## 2021-12-06 ENCOUNTER — Other Ambulatory Visit (HOSPITAL_COMMUNITY): Payer: Self-pay

## 2021-12-07 ENCOUNTER — Other Ambulatory Visit (HOSPITAL_COMMUNITY): Payer: Self-pay

## 2021-12-08 ENCOUNTER — Other Ambulatory Visit (HOSPITAL_COMMUNITY): Payer: Self-pay

## 2021-12-08 MED ORDER — METOPROLOL SUCCINATE ER 25 MG PO TB24
25.0000 mg | ORAL_TABLET | Freq: Two times a day (BID) | ORAL | 3 refills | Status: DC
  Filled 2021-12-08: qty 180, 90d supply, fill #0

## 2021-12-11 ENCOUNTER — Other Ambulatory Visit (HOSPITAL_COMMUNITY): Payer: Self-pay

## 2021-12-13 ENCOUNTER — Other Ambulatory Visit (HOSPITAL_COMMUNITY): Payer: Self-pay

## 2021-12-13 MED ORDER — METOPROLOL SUCCINATE ER 25 MG PO TB24
ORAL_TABLET | ORAL | 3 refills | Status: DC
  Filled 2021-12-13 – 2022-04-04 (×2): qty 180, 90d supply, fill #0
  Filled 2022-07-05: qty 180, 90d supply, fill #1
  Filled 2022-10-06: qty 180, 90d supply, fill #2

## 2021-12-23 ENCOUNTER — Other Ambulatory Visit (HOSPITAL_COMMUNITY): Payer: Self-pay

## 2021-12-23 DIAGNOSIS — B356 Tinea cruris: Secondary | ICD-10-CM | POA: Diagnosis not present

## 2021-12-23 DIAGNOSIS — L292 Pruritus vulvae: Secondary | ICD-10-CM | POA: Diagnosis not present

## 2021-12-23 DIAGNOSIS — R300 Dysuria: Secondary | ICD-10-CM | POA: Diagnosis not present

## 2021-12-23 DIAGNOSIS — E1169 Type 2 diabetes mellitus with other specified complication: Secondary | ICD-10-CM | POA: Diagnosis not present

## 2021-12-23 MED ORDER — FLUCONAZOLE 150 MG PO TABS
ORAL_TABLET | ORAL | 0 refills | Status: DC
  Filled 2021-12-23: qty 4, 28d supply, fill #0

## 2021-12-30 ENCOUNTER — Other Ambulatory Visit (HOSPITAL_COMMUNITY): Payer: Self-pay

## 2021-12-30 DIAGNOSIS — H2000 Unspecified acute and subacute iridocyclitis: Secondary | ICD-10-CM | POA: Diagnosis not present

## 2021-12-30 MED ORDER — PREDNISOLONE ACETATE 1 % OP SUSP
OPHTHALMIC | 3 refills | Status: DC
  Filled 2021-12-30: qty 10, 30d supply, fill #0

## 2022-01-11 ENCOUNTER — Other Ambulatory Visit: Payer: Self-pay | Admitting: Physician Assistant

## 2022-01-11 ENCOUNTER — Other Ambulatory Visit (HOSPITAL_COMMUNITY): Payer: Self-pay

## 2022-01-12 ENCOUNTER — Other Ambulatory Visit (HOSPITAL_COMMUNITY): Payer: Self-pay

## 2022-01-12 MED ORDER — CLOPIDOGREL BISULFATE 75 MG PO TABS
75.0000 mg | ORAL_TABLET | Freq: Every day | ORAL | 0 refills | Status: DC
  Filled 2022-01-12: qty 16, 16d supply, fill #0

## 2022-01-12 MED FILL — Lancets: 90 days supply | Qty: 200 | Fill #0 | Status: CN

## 2022-01-19 ENCOUNTER — Other Ambulatory Visit (HOSPITAL_COMMUNITY): Payer: Self-pay

## 2022-01-21 ENCOUNTER — Other Ambulatory Visit (HOSPITAL_COMMUNITY): Payer: Self-pay

## 2022-01-24 ENCOUNTER — Other Ambulatory Visit (HOSPITAL_COMMUNITY): Payer: Self-pay

## 2022-01-24 DIAGNOSIS — L219 Seborrheic dermatitis, unspecified: Secondary | ICD-10-CM | POA: Diagnosis not present

## 2022-01-24 MED ORDER — CLOBETASOL PROPIONATE 0.05 % EX SOLN
CUTANEOUS | 2 refills | Status: DC
  Filled 2022-01-24: qty 50, 30d supply, fill #0
  Filled 2022-05-12: qty 50, 30d supply, fill #1

## 2022-01-26 ENCOUNTER — Other Ambulatory Visit (HOSPITAL_COMMUNITY): Payer: Self-pay

## 2022-02-04 ENCOUNTER — Other Ambulatory Visit (HOSPITAL_COMMUNITY): Payer: Self-pay

## 2022-02-06 NOTE — Progress Notes (Signed)
?Cardiology Office Note:   ? ?Date:  02/07/2022  ? ?ID:  Deanna Simmons, DOB 03/31/1951, MRN [id number] ? ?PCP:  Josetta Huddle, MD  ?Cardiologist:  Sinclair Grooms, MD  ? ?Referring MD: Josetta Huddle, MD  ? ?Chief Complaint  ?Patient presents with  ? Cardiac Valve Problem  ?  TAVR  ? Hypertension  ? Coronary Artery Disease  ? ? ?History of Present Illness:   ? ?Deanna Simmons is a 71 y.o. female with a hx of CAD s/p remote CABG x1V in 2004 by Dr. Cyndia Bent with a SVG to PDA, HTN, asthma, type 2 diabetes, SLE, prior stroke with right retinal artery occlusion and more recent TIA in June 2022 and severe AS s/p TAVR (07/13/21). ? ? ?Deanna Simmons is doing well.  She is back at work full-time.  Only limitation now is related to left knee arthritis.  She underwent TAVR in September without complications.  She denies orthopnea, PND, palpitations, and syncope.  There is no peripheral edema. ? ?She now has less enthusiasm about knee replacement surgery. ? ? ? ? ?Past Medical History:  ?Diagnosis Date  ? Abnormal finding on Pap smear, ASCUS   ? Anemia   ? Asthma   ? Chondromalacia of knee   ? Coronary artery disease   ? Diabetes mellitus without complication ([place])   ? Diverticulum 12/11/2002  ? in cecum  ? Fibroids   ? h/o  ? GERD (gastroesophageal reflux disease)   ? H/O tinea cruris 02/28/2010  ? History of CVA (cerebrovascular accident)   ? Hypercholesteremia   ? Hypertension   ? Obesity   ? Pneumonia 07/01/2008  ? S/P TAVR (transcatheter aortic valve replacement) 07/13/2021  ? s/p TAVR w/ a 4m Edwards S3U via the TF approach by Dr. CBurt Knackand Dr. BCyndia Bent  ? Synovitis of knee   ? Systemic lupus erythematosus ([place]   ? with nephritis  ? Vulvitis   ? h/o  ? ? ?Past Surgical History:  ?Procedure Laterality Date  ? CARDIAC CATHETERIZATION    ? CESAREAN SECTION    ? CORONARY ARTERY BYPASS GRAFT    ? Endometrial curettage  03/29/2000  ? INTRAOPERATIVE TRANSTHORACIC ECHOCARDIOGRAM Left 07/13/2021  ? Procedure: INTRAOPERATIVE  TRANSTHORACIC ECHOCARDIOGRAM;  Surgeon: CSherren Mocha MD;  [facility]  Service: Open Heart Surgery;  Laterality: Left;  ? RIGHT/LEFT HEART CATH AND CORONARY/GRAFT ANGIOGRAPHY N/A 06/11/2021  ? Procedure: RIGHT/LEFT HEART CATH AND CORONARY/GRAFT ANGIOGRAPHY;  Surgeon: SBelva Crome MD;  [facility];  Service: Cardiovascular;  Laterality: N/A;  ? ROTATOR CUFF REPAIR    ? TRANSCATHETER AORTIC VALVE REPLACEMENT, TRANSFEMORAL N/A 07/13/2021  ? Procedure: TRANSCATHETER AORTIC VALVE REPLACEMENT, TRANSFEMORAL;  Surgeon: CSherren Mocha MD;  [facility]  Service: Open Heart Surgery;  Laterality: N/A;  ? TUBAL LIGATION    ? ULTRASOUND GUIDANCE FOR VASCULAR ACCESS Bilateral 07/13/2021  ? Procedure: ULTRASOUND GUIDANCE FOR VASCULAR ACCESS;  Surgeon: CSherren Mocha MD;  [facility]  Service: Open Heart Surgery;  Laterality: Bilateral;  ? UPPER GASTROINTESTINAL ENDOSCOPY  12/11/2002  ? ? ?Current Medications: ?Current Meds  ?Medication Sig  ? acetaminophen (TYLENOL) 500 MG tablet Take 1,000 mg by mouth every 6 (six) hours as needed for mild pain.  ? ADVAIR DISKUS 250-50 MCG/DOSE AEPB INHALE 1 PUFF INTO THE LUNGS IN THE MORNING AND AT BEDTIME.  ? albuterol (VENTOLIN HFA) 108 (90 Base) MCG/ACT inhaler INHALE 2 PUFFS BY MOUTH EVERY 4-6 HOURS AS NEEDED FOR COUGH OR  WHEEZE  ? ALPRAZolam (XANAX) 0.25 MG tablet Take 1 tablet (0.25 mg total) by mouth 2 (two) times daily as needed for anxiety  ? beclomethasone (QVAR REDIHALER) 80 MCG/ACT inhaler Inhale 3 puffs 3 times daily during asthma flares. (Patient taking differently: 3 puffs 3 (three) times daily as needed (asthma flares).)  ? cetirizine (ZYRTEC) 10 MG tablet Take 1 tablet (10 mg total) by mouth 2 (two) times daily as needed for allergies (Can use an extra dose during flares). (Patient taking differently: Take 10 mg by mouth in the morning.)  ? clobetasol (TEMOVATE) 0.05 % external solution Apply once daily to the affected area(s) as  directed Monday through Friday  ? clotrimazole (CLOTRIMAZOLE ANTI-FUNGAL) 1 % cream APPLY TO THE AFFECTED AND SURROUNDING AREAS OF SKIN BY TOPICAL ROUTE 2 TIMES PER DAY IN THE MORNING AND EVENING  ? dapagliflozin propanediol (FARXIGA) 10 MG TABS tablet Take 1 tablet by mouth once a day  ? Estradiol 10 MCG TABS vaginal tablet INSERT 1 TABLET TWICE A WEEK VAGINALLY AS DIRECTED  ? ezetimibe (ZETIA) 10 MG tablet Take 1 tablet (10 mg total) by mouth daily.  ? ferrous sulfate 325 (65 FE) MG tablet Take 325 mg by mouth daily.  ? fluconazole (DIFLUCAN) 150 MG tablet Take 1 tablet by mouth once a week  ? fluticasone (FLONASE) 50 MCG/ACT nasal spray Place 2 sprays into both nostrils daily. (Patient taking differently: Place 2 sprays into both nostrils daily as needed for allergies.)  ? fluticasone-salmeterol (ADVAIR) 250-50 MCG/ACT AEPB Inhale 1 puff into the lungs 1-2 times per day depending on disease activity (must keep appointment for refills)  ? glucose blood test strip Use to test blood glucose twice daily before meals.  ? Lancets (FREESTYLE) lancets USE TO TEST TWO TIMES DAILY PRIOR TO MEALS  ? magnesium gluconate (MAGONATE) 500 MG tablet Take 500 mg by mouth daily as needed (cramps).  ? metoprolol succinate (TOPROL-XL) 25 MG 24 hr tablet TAKE 1 TABLET BY MOUTH TWICE DAILY  ? Multiple Vitamins-Minerals (MULTIVITAL PO) Take 1 tablet by mouth daily.  ? nitrofurantoin, macrocrystal-monohydrate, (MACROBID) 100 MG capsule Take 1 capsule (100 mg total) by mouth 2 (two) times daily for 5 days  ? nitroGLYCERIN (NITROSTAT) 0.4 MG SL tablet Place 1 tablet (0.4 mg total) under the tongue every 5 (five) minutes as needed for up to 25 days for chest pain.  ? prednisoLONE acetate (PRED FORTE) 1 % ophthalmic suspension INSTILL 1 DROP INTO RIGHT EYE 2 TIMES A DAY.  ? Probiotic Product (PROBIOTIC DAILY PO) Take 1 capsule by mouth at bedtime. Ultra Flora IB  ? rosuvastatin (CRESTOR) 40 MG tablet Take 1 tablet (40 mg total) by mouth  daily.  ? valACYclovir (VALTREX) 500 MG tablet TAKE 1 TABLET BY MOUTH TWICE DAILY AS NEEDED  ? vitamin C (ASCORBIC ACID) 500 MG tablet Take 500 mg by mouth daily.  ? Vitamin D-Vitamin K (VITAMIN K2-VITAMIN D3 PO) Take 1 tablet by mouth every other day.  ? zinc gluconate 50 MG tablet Take 50 mg by mouth every evening.  ? [DISCONTINUED] aspirin 81 MG tablet Take 81 mg by mouth daily.  ? [DISCONTINUED] clopidogrel (PLAVIX) 75 MG tablet Take 1 tablet (75 mg total) by mouth daily. Patient is to discontinue medication in March 2023. Thank you  ?  ? ?Allergies:   Biaxin [clarithromycin], Cardiolite [technetium-45m, Ciprofloxacin, Erythromycin base, Montelukast sodium, Niaspan [niacin er], Penicillins, Prilosec [omeprazole], Singulair [montelukast], Ticlid [ticlopidine hcl], and Iodinated contrast media  ? ?Social History  ? ?  Socioeconomic History  ? Marital status: Married  ?  Spouse name: Alvester Chou  ? Number of children: 4  ? Years of education: 12+  ? Highest education level: Not on file  ?Occupational History  ? Occupation: CARE MANAGEMENT ASST  ?  Employer: [employer]  ?Tobacco Use  ? Smoking status: Former  ? Smokeless tobacco: Never  ?Substance and Sexual Activity  ? Alcohol use: Yes  ?  Comment: occasional  ? Drug use: No  ? Sexual activity: Not on file  ?Other Topics Concern  ? Not on file  ?Social History Narrative  ? Lives with her husband. Their children are grown, 1 lives in [place], 1 in [place], [place],  1 in [place], [place], and one in [place], [place].  ? Right handed  ? ?Social Determinants of Health  ? ?Financial Resource Strain: Not on file  ?Food Insecurity: Not on file  ?Transportation Needs: Not on file  ?Physical Activity: Not on file  ?Stress: Not on file  ?Social Connections: Not on file  ?  ? ?Family History: ?The patient's family history includes Breast cancer (age of onset: 1) in her mother; Diabetes in her father and mother; Heart attack in her paternal uncle; Hyperlipidemia in her father;  Ovarian cancer in her cousin; Prostate cancer in her father; Stroke in her mother. ? ?ROS:   ?Please see the history of present illness.    ?Difficulty with left knee.  Has not scheduled knee replacement yet.  Inquires about

## 2022-02-07 ENCOUNTER — Other Ambulatory Visit (HOSPITAL_COMMUNITY): Payer: Self-pay

## 2022-02-07 ENCOUNTER — Encounter: Payer: Self-pay | Admitting: Interventional Cardiology

## 2022-02-07 ENCOUNTER — Ambulatory Visit: Payer: 59 | Admitting: Interventional Cardiology

## 2022-02-07 VITALS — BP 106/66 | HR 78 | Ht 63.75 in | Wt 225.4 lb

## 2022-02-07 DIAGNOSIS — I25708 Atherosclerosis of coronary artery bypass graft(s), unspecified, with other forms of angina pectoris: Secondary | ICD-10-CM | POA: Diagnosis not present

## 2022-02-07 DIAGNOSIS — E7800 Pure hypercholesterolemia, unspecified: Secondary | ICD-10-CM | POA: Diagnosis not present

## 2022-02-07 DIAGNOSIS — Z952 Presence of prosthetic heart valve: Secondary | ICD-10-CM

## 2022-02-07 DIAGNOSIS — I10 Essential (primary) hypertension: Secondary | ICD-10-CM

## 2022-02-07 DIAGNOSIS — E1159 Type 2 diabetes mellitus with other circulatory complications: Secondary | ICD-10-CM | POA: Diagnosis not present

## 2022-02-07 DIAGNOSIS — Z8673 Personal history of transient ischemic attack (TIA), and cerebral infarction without residual deficits: Secondary | ICD-10-CM | POA: Diagnosis not present

## 2022-02-07 MED ORDER — CLOPIDOGREL BISULFATE 75 MG PO TABS
75.0000 mg | ORAL_TABLET | Freq: Every day | ORAL | 3 refills | Status: DC
  Filled 2022-02-07: qty 90, 90d supply, fill #0
  Filled 2022-05-12: qty 90, 90d supply, fill #1
  Filled 2022-08-16: qty 90, 90d supply, fill #2
  Filled 2022-11-28: qty 90, 90d supply, fill #3

## 2022-02-07 MED ORDER — GLUCOSE BLOOD VI STRP
ORAL_STRIP | 3 refills | Status: AC
  Filled 2022-02-07: qty 200, 90d supply, fill #0

## 2022-02-07 NOTE — Patient Instructions (Addendum)
Medication Instructions:  ?1) DISCONTINUE Aspirin ? ?*If you need a refill on your cardiac medications before your next appointment, please call your pharmacy* ? ? ?Lab Work: ?None ?If you have labs (blood work) drawn today and your tests are completely normal, you will receive your results only by: ?MyChart Message (if you have MyChart) OR ?A paper copy in the mail ?If you have any lab test that is abnormal or we need to change your treatment, we will call you to review the results. ? ? ?Testing/Procedures: ?None ? ? ?Follow-Up: ?At [facility], you and your health needs are our priority.  As part of our continuing mission to provide you with exceptional heart care, we have created designated Provider Care Teams.  These Care Teams include your primary Cardiologist (physician) and Advanced Practice Providers (APPs -  Physician Assistants and Nurse Practitioners) who all work together to provide you with the care you need, when you need it. ? ?We recommend signing up for the patient portal called "MyChart".  Sign up information is provided on this After Visit Summary.  MyChart is used to connect with patients for Virtual Visits (Telemedicine).  Patients are able to view lab/test results, encounter notes, upcoming appointments, etc.  Non-urgent messages can be sent to your provider as well.   ?To learn more about what you can do with MyChart, go to [email or web address].   ? ?Your next appointment:   ?1 year(s) ? ?The format for your next appointment:   ?In Person ? ?Provider:   ?Sinclair Grooms, MD  ? ? ?Other Instructions ? ? ?Important Information About Sugar ? ? ? ? ?  ?

## 2022-02-12 ENCOUNTER — Other Ambulatory Visit (HOSPITAL_COMMUNITY): Payer: Self-pay

## 2022-02-17 ENCOUNTER — Other Ambulatory Visit (HOSPITAL_COMMUNITY): Payer: Self-pay

## 2022-02-26 ENCOUNTER — Other Ambulatory Visit (HOSPITAL_COMMUNITY): Payer: Self-pay

## 2022-02-28 ENCOUNTER — Telehealth: Payer: Self-pay | Admitting: Allergy and Immunology

## 2022-02-28 NOTE — Telephone Encounter (Signed)
Patient called office stating that her pharmacy inform her that Advair will need a PA. Patient stated that she would rather use Qvar if that's okay with Dr Neldon Mc. ? ?Inform patient that she can further discuss this with Dr Neldon Mc on her appointment on Tuesday 03/01/2022. She stated that she would rather have something in the system so he is aware. ? ?Also noted she wanted to be notified if there was a cancellation in the morning. I did inform the ladies in the front office. ?

## 2022-03-01 ENCOUNTER — Encounter: Payer: Self-pay | Admitting: Allergy and Immunology

## 2022-03-01 ENCOUNTER — Other Ambulatory Visit: Payer: Self-pay | Admitting: Allergy and Immunology

## 2022-03-01 ENCOUNTER — Ambulatory Visit: Payer: 59 | Admitting: Allergy and Immunology

## 2022-03-01 ENCOUNTER — Other Ambulatory Visit (HOSPITAL_COMMUNITY): Payer: Self-pay

## 2022-03-01 VITALS — BP 108/60 | HR 75 | Temp 97.7°F | Resp 16 | Ht 62.5 in | Wt 226.2 lb

## 2022-03-01 DIAGNOSIS — J302 Other seasonal allergic rhinitis: Secondary | ICD-10-CM

## 2022-03-01 DIAGNOSIS — K219 Gastro-esophageal reflux disease without esophagitis: Secondary | ICD-10-CM | POA: Diagnosis not present

## 2022-03-01 DIAGNOSIS — J4540 Moderate persistent asthma, uncomplicated: Secondary | ICD-10-CM | POA: Diagnosis not present

## 2022-03-01 DIAGNOSIS — J069 Acute upper respiratory infection, unspecified: Secondary | ICD-10-CM | POA: Diagnosis not present

## 2022-03-01 DIAGNOSIS — J3089 Other allergic rhinitis: Secondary | ICD-10-CM | POA: Diagnosis not present

## 2022-03-01 MED ORDER — FLUTICASONE PROPIONATE 50 MCG/ACT NA SUSP
1.0000 | Freq: Every day | NASAL | 1 refills | Status: DC
  Filled 2022-03-01: qty 16, 60d supply, fill #0

## 2022-03-01 MED ORDER — ARNUITY ELLIPTA 200 MCG/ACT IN AEPB
1.0000 | INHALATION_SPRAY | Freq: Two times a day (BID) | RESPIRATORY_TRACT | 5 refills | Status: DC
  Filled 2022-03-01: qty 60, fill #0

## 2022-03-01 MED ORDER — ARNUITY ELLIPTA 200 MCG/ACT IN AEPB
1.0000 | INHALATION_SPRAY | Freq: Two times a day (BID) | RESPIRATORY_TRACT | 5 refills | Status: DC
  Filled 2022-03-01: qty 60, 30d supply, fill #0

## 2022-03-01 MED ORDER — FLUTICASONE-SALMETEROL 250-50 MCG/ACT IN AEPB
1.0000 | INHALATION_SPRAY | Freq: Two times a day (BID) | RESPIRATORY_TRACT | 5 refills | Status: DC
  Filled 2022-03-01 – 2022-05-14 (×2): qty 60, 30d supply, fill #0

## 2022-03-01 MED ORDER — FAMOTIDINE 40 MG PO TABS
40.0000 mg | ORAL_TABLET | Freq: Every day | ORAL | 1 refills | Status: DC
  Filled 2022-03-01: qty 90, 90d supply, fill #0
  Filled 2022-05-12: qty 90, 90d supply, fill #1

## 2022-03-01 MED ORDER — ALBUTEROL SULFATE HFA 108 (90 BASE) MCG/ACT IN AERS
INHALATION_SPRAY | RESPIRATORY_TRACT | 1 refills | Status: DC
  Filled 2022-03-01: qty 18, 16d supply, fill #0
  Filled 2022-05-14: qty 18, 16d supply, fill #1

## 2022-03-01 NOTE — Patient Instructions (Addendum)
?  1.  Continue Advair 250 1 inhalation 1-2 times per day depending on disease activity ? ?2.  Continue Flonase 1 spray each nostril 3-7 times per week depending on disease activity ? ?3. Continue Famotidine 40 mg tablet 1 time per day ? ?4.  "Action plan" for asthma flare: ? ? A. Add Arnuity 200 - 1 inhalation 2 times per day ? B. Use Albuterol HFA -2 puffs every 4-6 hours if needed ? C. Increase famotidine to 40 mg 2 times per day ? ?5. Zyrtec 10 mg one tablet one time per day if needed ? ?6. For this recent episode: ? ? A. Prednisone 10 mg - 1 tablet 1 time per day for 10 days only.  ? B. "Action Plan" ? ?7. Return to clinic in 6 months or earlier if problem ? ?8. Use spinal anesthetic with upcoming total knee replacement ? ? ? ?   ?

## 2022-03-01 NOTE — Progress Notes (Signed)
? ?Deanna Simmons ? ? ?Follow-up Note ? ?Referring Provider: Josetta Huddle, MD ?Primary Provider: Josetta Huddle, MD ?Date of Office Visit: 03/01/2022 ? ?Subjective:  ? ?Deanna Simmons (DOB: [birth year]) is a 71 y.o. female who returns to the [facility] [place] on 03/01/2022 in re-evaluation of the following: ? ?HPI: Deanna Simmons returns to this clinic in reevaluation of asthma, allergic rhinitis, and LPR.  I last saw in this clinic on 31 August 2021.  ? ?She was doing very well regarding her airway issue and did not require systemic steroid or an antibiotic for any type of airway problem while using her Advair mostly 1 time per day and using her Flonase mostly 1 time per day.  Unfortunately, over the course of the past 3 days she has developed acute onset of shortness of breath and coughing and wheezing and runny clear nasal discharge and some raspy voice without any fever or anosmia or ugly nasal discharge or ugly sputum production or chest pain or other associated systemic or constitutional symptoms. ? ?Her reflux remains under very good control at this point in time while using famotidine. ? ?She will have a total knee replacement performed in September 2023. ? ?Allergies as of 03/01/2022   ? ?   Reactions  ? Biaxin [clarithromycin] Itching  ? Cardiolite [technetium-74m Itching  ? Ciprofloxacin Itching  ? Erythromycin Base Itching  ? Montelukast Sodium Itching  ? Niaspan [niacin Er] Other (See Comments)  ? flushing  ? Penicillins Itching  ? Reaction: over 10 years  ? Prilosec [omeprazole] Itching  ? Singulair [montelukast] Hives, Itching  ? Ticlid [ticlopidine Hcl] Itching  ? Iodinated Contrast Media Itching  ? Pt has a potential contrast allergy; She states in 2012 her cardiologist told her she had a reaction during the "study". We are unable to determine the study or confirm a reaction. The patient will have a 13 hr prep for future studies. Molecular Imaging states  their injection does not cause any reactions.  ? ?  ? ?  ?Medication List  ? ? ?acetaminophen 500 MG tablet ?Commonly known as: TYLENOL ?Take 1,000 mg by mouth every 6 (six) hours as needed for mild pain. ?  ?Advair Diskus 250-50 MCG/ACT Aepb ?Generic drug: fluticasone-salmeterol ?INHALE 1 PUFF INTO THE LUNGS IN THE MORNING AND AT BEDTIME. ?  ?Advair Diskus 250-50 MCG/ACT Aepb ?Generic drug: fluticasone-salmeterol ?Inhale 1 puff into the lungs 1-2 times per day depending on disease activity (must keep appointment for refills) ?  ?albuterol 108 (90 Base) MCG/ACT inhaler ?Commonly known as: Ventolin HFA ?INHALE 2 PUFFS BY MOUTH EVERY 4-6 HOURS AS NEEDED FOR COUGH OR WHEEZE ?  ?ALPRAZolam 0.25 MG tablet ?Commonly known as: XDuanne Moron?Take 1 tablet (0.25 mg total) by mouth 2 (two) times daily as needed for anxiety ?  ?cetirizine 10 MG tablet ?Commonly known as: ZYRTEC ?Take 1 tablet (10 mg total) by mouth 2 (two) times daily as needed for allergies (Can use an extra dose during flares). ?  ?clobetasol 0.05 % external solution ?Commonly known as: TEMOVATE ?Apply once daily to the affected area(s) as directed Monday through Friday ?  ?clopidogrel 75 MG tablet ?Commonly known as: PLAVIX ?Take 1 tablet (75 mg total) by mouth daily. ?  ?clotrimazole 1 % cream ?Commonly known as: Clotrimazole Anti-Fungal ?APPLY TO THE AFFECTED AND SURROUNDING AREAS OF SKIN BY TOPICAL ROUTE 2 TIMES PER DAY IN THE MORNING AND EVENING ?  ?Estradiol 10 MCG Tabs  vaginal tablet ?INSERT 1 TABLET TWICE A WEEK VAGINALLY AS DIRECTED ?  ?ezetimibe 10 MG tablet ?Commonly known as: ZETIA ?Take 1 tablet (10 mg total) by mouth daily. ?  ?Farxiga 10 MG Tabs tablet ?Generic drug: dapagliflozin propanediol ?Take 1 tablet by mouth once a day ?(Take 1 tablet by mouth once a day) ?  ?ferrous sulfate 325 (65 FE) MG tablet ?Take 325 mg by mouth daily. ?  ?fluticasone 50 MCG/ACT nasal spray ?Commonly known as: FLONASE ?Place 2 sprays into both nostrils daily. ?   ?freestyle lancets ?USE TO TEST TWO TIMES DAILY PRIOR TO MEALS ?  ?glucose blood test strip ?Use to test blood glucose twice daily before meals. ?  ?magnesium gluconate 500 MG tablet ?Commonly known as: MAGONATE ?Take 500 mg by mouth daily as needed (cramps). ?  ?metoprolol succinate 25 MG 24 hr tablet ?Commonly known as: TOPROL-XL ?TAKE 1 TABLET BY MOUTH TWICE DAILY ?  ?MULTIVITAL PO ?Take 1 tablet by mouth daily. ?  ?nitroGLYCERIN 0.4 MG SL tablet ?Commonly known as: NITROSTAT ?Place 1 tablet (0.4 mg total) under the tongue every 5 (five) minutes as needed for up to 25 days for chest pain. ?  ?PROBIOTIC DAILY PO ?Take 1 capsule by mouth at bedtime. Ultra Flora IB ?  ?Qvar RediHaler 80 MCG/ACT inhaler ?Generic drug: beclomethasone ?Inhale 3 puffs 3 times daily during asthma flares. ?  ?rosuvastatin 40 MG tablet ?Commonly known as: CRESTOR ?Take 1 tablet (40 mg total) by mouth daily. ?  ?valACYclovir 500 MG tablet ?Commonly known as: VALTREX ?TAKE 1 TABLET BY MOUTH TWICE DAILY AS NEEDED ?  ?vitamin C 500 MG tablet ?Commonly known as: ASCORBIC ACID ?Take 500 mg by mouth daily. ?  ?VITAMIN K2-VITAMIN D3 PO ?Take 1 tablet by mouth every other day. ?  ?zinc gluconate 50 MG tablet ?Take 50 mg by mouth every evening. ?  ? ?Past Medical History:  ?Diagnosis Date  ? Abnormal finding on Pap smear, ASCUS   ? Anemia   ? Asthma   ? Chondromalacia of knee   ? Coronary artery disease   ? Diabetes mellitus without complication ([place])   ? Diverticulum 12/11/2002  ? in cecum  ? Fibroids   ? h/o  ? GERD (gastroesophageal reflux disease)   ? H/O tinea cruris 02/28/2010  ? History of CVA (cerebrovascular accident)   ? Hypercholesteremia   ? Hypertension   ? Obesity   ? Pneumonia 07/01/2008  ? S/P TAVR (transcatheter aortic valve replacement) 07/13/2021  ? s/p TAVR w/ a 30m Edwards S3U via the TF approach by Dr. CBurt Knackand Dr. BCyndia Bent  ? Synovitis of knee   ? Systemic lupus erythematosus ([place]   ? with nephritis  ? Vulvitis   ? h/o   ? ? ?Past Surgical History:  ?Procedure Laterality Date  ? CARDIAC CATHETERIZATION    ? CESAREAN SECTION    ? CORONARY ARTERY BYPASS GRAFT    ? Endometrial curettage  03/29/2000  ? INTRAOPERATIVE TRANSTHORACIC ECHOCARDIOGRAM Left 07/13/2021  ? Procedure: INTRAOPERATIVE TRANSTHORACIC ECHOCARDIOGRAM;  Surgeon: CSherren Mocha MD;  [facility]  Service: Open Heart Surgery;  Laterality: Left;  ? RIGHT/LEFT HEART CATH AND CORONARY/GRAFT ANGIOGRAPHY N/A 06/11/2021  ? Procedure: RIGHT/LEFT HEART CATH AND CORONARY/GRAFT ANGIOGRAPHY;  Surgeon: SBelva Crome MD;  [facility];  Service: Cardiovascular;  Laterality: N/A;  ? ROTATOR CUFF REPAIR    ? TRANSCATHETER AORTIC VALVE REPLACEMENT, TRANSFEMORAL N/A 07/13/2021  ? Procedure: TRANSCATHETER AORTIC VALVE REPLACEMENT, TRANSFEMORAL;  Surgeon: Sherren Mocha, MD;  Location: [facility];  Service: Open Heart Surgery;  Laterality: N/A;  ? TUBAL LIGATION    ? ULTRASOUND GUIDANCE FOR VASCULAR ACCESS Bilateral 07/13/2021  ? Procedure: ULTRASOUND GUIDANCE FOR VASCULAR ACCESS;  Surgeon: Sherren Mocha, MD;  Location: [facility];  Service: Open Heart Surgery;  Laterality: Bilateral;  ? UPPER GASTROINTESTINAL ENDOSCOPY  12/11/2002  ? ? ?Review of systems negative except as noted in HPI / PMHx or noted below: ? ?Review of Systems  ?Constitutional: Negative.   ?HENT: Negative.    ?Eyes: Negative.   ?Respiratory: Negative.    ?Cardiovascular: Negative.   ?Gastrointestinal: Negative.   ?Genitourinary: Negative.   ?Musculoskeletal: Negative.   ?Skin: Negative.   ?Neurological: Negative.   ?Endo/Heme/Allergies: Negative.   ?Psychiatric/Behavioral: Negative.    ? ? ?Objective:  ? ?Vitals:  ? 03/01/22 1638  ?BP: 108/60  ?Pulse: 75  ?Resp: 16  ?Temp: 97.7 ?F (36.5 ?C)  ?SpO2: 99%  ? ?Height: 5' 2.5" (158.8 cm)  ?Weight: 226 lb 3.2 oz (102.6 kg)  ? ?Physical Exam ?Constitutional:   ?   Appearance: She is not diaphoretic.  ?HENT:  ?   Head: Normocephalic.  ?   Right Ear: Tympanic  membrane, ear canal and external ear normal.  ?   Left Ear: Tympanic membrane, ear canal and external ear normal.  ?   Nose: Nose normal. No mucosal edema or rhinorrhea.  ?   Mouth/Throat:  ?   Pharynx: Uvula midlin

## 2022-03-02 ENCOUNTER — Encounter: Payer: Self-pay | Admitting: Allergy and Immunology

## 2022-03-02 ENCOUNTER — Telehealth: Payer: Self-pay | Admitting: Allergy and Immunology

## 2022-03-02 ENCOUNTER — Other Ambulatory Visit (HOSPITAL_COMMUNITY): Payer: Self-pay

## 2022-03-02 MED ORDER — ARNUITY ELLIPTA 200 MCG/ACT IN AEPB
1.0000 | INHALATION_SPRAY | Freq: Every day | RESPIRATORY_TRACT | 5 refills | Status: DC
  Filled 2022-03-02 – 2022-05-14 (×2): qty 30, 30d supply, fill #0
  Filled 2022-05-16: qty 60, 30d supply, fill #0
  Filled 2022-05-16: qty 30, 30d supply, fill #0

## 2022-03-02 NOTE — Telephone Encounter (Signed)
Patient called back. Refill sent as instructed by Dr Neldon Mc. ?

## 2022-03-02 NOTE — Telephone Encounter (Signed)
Rx for Arrnuity have been change per Dr Bruna Potter instruction. ?

## 2022-03-02 NOTE — Telephone Encounter (Signed)
Deanna Simmons called in and states insurance will not cover Arnuity because it is written as "take one puff twice a day" and they only cover one puff a day is what the pharmacist told her.  Deanna Simmons wants to know if we could do a prior auth at the request of the pharmacist or can we change her to something else?  Please advise. ?

## 2022-03-02 NOTE — Telephone Encounter (Signed)
Called Deanna Simmons to inform her we were able to get her Arnuity sent in and Deanna Simmons stated that even with the change in dose/directions that the insurance will still not cover Arnuity.  Deanna Simmons would like QVAR called in instead.  Deanna Simmons did state the Pharmacist told her that a prior authorization would probably have to be filled out and she would like this to happen in order for her to get the QVAR.  Please advise on the QVAR change.   ?

## 2022-03-03 ENCOUNTER — Other Ambulatory Visit (HOSPITAL_COMMUNITY): Payer: Self-pay

## 2022-03-03 MED ORDER — QVAR REDIHALER 80 MCG/ACT IN AERB
2.0000 | INHALATION_SPRAY | Freq: Two times a day (BID) | RESPIRATORY_TRACT | 5 refills | Status: DC
  Filled 2022-03-03: qty 10.6, fill #0
  Filled 2022-05-16 (×2): qty 10.6, 30d supply, fill #0

## 2022-03-03 NOTE — Telephone Encounter (Signed)
Please order Qvar 80 -2 inhalations twice a day.  If this requires a prior authorization please complete the prior authorization. ?

## 2022-03-03 NOTE — Telephone Encounter (Signed)
Send Qvar as instructed by Dr Neldon Mc ?

## 2022-03-04 ENCOUNTER — Other Ambulatory Visit (HOSPITAL_COMMUNITY): Payer: Self-pay

## 2022-03-04 DIAGNOSIS — E119 Type 2 diabetes mellitus without complications: Secondary | ICD-10-CM | POA: Diagnosis not present

## 2022-03-04 DIAGNOSIS — M1990 Unspecified osteoarthritis, unspecified site: Secondary | ICD-10-CM | POA: Diagnosis not present

## 2022-03-04 DIAGNOSIS — E785 Hyperlipidemia, unspecified: Secondary | ICD-10-CM | POA: Diagnosis not present

## 2022-03-04 DIAGNOSIS — M3214 Glomerular disease in systemic lupus erythematosus: Secondary | ICD-10-CM | POA: Diagnosis not present

## 2022-03-04 DIAGNOSIS — I129 Hypertensive chronic kidney disease with stage 1 through stage 4 chronic kidney disease, or unspecified chronic kidney disease: Secondary | ICD-10-CM | POA: Diagnosis not present

## 2022-03-14 ENCOUNTER — Other Ambulatory Visit (HOSPITAL_COMMUNITY): Payer: Self-pay

## 2022-03-16 ENCOUNTER — Other Ambulatory Visit (HOSPITAL_COMMUNITY): Payer: Self-pay

## 2022-03-26 ENCOUNTER — Other Ambulatory Visit (HOSPITAL_COMMUNITY): Payer: Self-pay

## 2022-03-30 DIAGNOSIS — H2000 Unspecified acute and subacute iridocyclitis: Secondary | ICD-10-CM | POA: Diagnosis not present

## 2022-04-03 ENCOUNTER — Other Ambulatory Visit (HOSPITAL_COMMUNITY): Payer: Self-pay

## 2022-04-04 ENCOUNTER — Other Ambulatory Visit (HOSPITAL_COMMUNITY): Payer: Self-pay

## 2022-04-04 MED ORDER — FARXIGA 10 MG PO TABS
10.0000 mg | ORAL_TABLET | Freq: Every day | ORAL | 3 refills | Status: DC
  Filled 2022-04-04: qty 90, 90d supply, fill #0
  Filled 2022-07-05: qty 90, 90d supply, fill #1
  Filled 2022-10-06: qty 90, 90d supply, fill #2
  Filled 2023-01-09 – 2023-02-03 (×2): qty 90, 90d supply, fill #3

## 2022-04-11 ENCOUNTER — Telehealth: Payer: Self-pay | Admitting: *Deleted

## 2022-04-11 NOTE — Telephone Encounter (Signed)
   Pre-operative Risk Assessment    Patient Name: Deanna Simmons  DOB: 1951/06/23 MRN: [id number]      Request for Surgical Clearance    Procedure:   LEFT TOTAL KNEE ARTHROPLASTY  Date of Surgery:  Clearance 07/18/22                                 Surgeon:  DR. Gaynelle Arabian Surgeon's Group or Practice Name: [facility] Phone number: [phone] ATTN: [place] Fax number:  [phone]   Type of Clearance Requested:   - Medical  - Pharmacy:  Hold Clopidogrel (Plavix)     Type of Anesthesia:   CHOICE   Additional requests/questions:    Jiles Prows   04/11/2022, 1:14 PM

## 2022-04-12 ENCOUNTER — Other Ambulatory Visit: Payer: Self-pay | Admitting: Physician Assistant

## 2022-04-12 ENCOUNTER — Other Ambulatory Visit (HOSPITAL_COMMUNITY): Payer: Self-pay

## 2022-04-12 ENCOUNTER — Telehealth: Payer: Self-pay | Admitting: Cardiovascular Disease

## 2022-04-12 MED ORDER — AZITHROMYCIN 500 MG PO TABS
500.0000 mg | ORAL_TABLET | ORAL | 12 refills | Status: DC
  Filled 2022-04-12: qty 6, 6d supply, fill #0

## 2022-04-12 NOTE — Telephone Encounter (Signed)
Called pt to inquire about antibiotic reaction. Pt reports many years ago had itching after taking PCN. Nothing other than itching that pt can recall.  Pt reports was told to take Benadryl for itching. Reviewed medication instructions with pt. No questions or concerns voiced.

## 2022-04-12 NOTE — Telephone Encounter (Signed)
Called pt to inquire date of dental appointment.  Pt reports appointment was today but Dentist will not see until an antibiotic is ordered.  Will route to [place], [place] to address.

## 2022-04-12 NOTE — Telephone Encounter (Signed)
Patient is calling stating she forgot of the name of her premed for dental cleanings that Angelena Form prescribed her. She is needing to know due to going in for a cleaning and being unable to get it performed without it.

## 2022-04-12 NOTE — Telephone Encounter (Signed)
Request for patient to hold Plavix for left TKA on 07/18/22. She is s/p TAVR 07/2021.  Per your last office visit note:  Currently on dual antiplatelet therapy.  Have decided to drop aspirin and continue clopidogrel.  She does not have a requirement for Plavix because of prior stents.  She did have saphenous vein graft to right coronary urgently after failed angioplasty in 2004.  The right coronary is known to be totally occluded.  The graft was patent but had ostial 50 to 70% narrowing at diagnostic cath in August 2022.  May patient hold Plavix for upcoming procedure? Please direct your response to p cv div preop  Thank you, Emmaline Life, NP-C    04/12/2022, 10:19 AM [place] [address], [address] Office [phone] Fax [phone]

## 2022-04-13 NOTE — Telephone Encounter (Signed)
Primary Cardiologist:Henry Nicholes Stairs III, MD  Chart reviewed as part of pre-operative protocol coverage. Because of Deanna Simmons's past medical history and time since last visit, he/she will require a virtual visit/telephone call in order to better assess preoperative cardiovascular risk.  Pre-op covering staff: - Please contact patient, obtain consent, and schedule appointment   Per Dr. Tamala Julian, she may hold Plavix for 5-7 days prior to upcoming surgery.   Emmaline Life, NP-C    04/13/2022, 8:58 AM [place] [address], [address] Office [phone] Fax [phone]

## 2022-04-15 NOTE — Telephone Encounter (Signed)
I called the pt to schedule a tele pre op appt for Sept around the time of her echo appt 07/13/22. I could not get the schedules to open correctly. The pt and I decided I will call her back on Monday and see if the schedule is working then. I then called the pt back and left message that we could set up tele appt end of August 06/30/22, she will have echo on 9/13 and then we can finalized clearance after the echo. I will call the pt back on Monday.

## 2022-04-18 ENCOUNTER — Telehealth: Payer: Self-pay | Admitting: *Deleted

## 2022-04-18 NOTE — Telephone Encounter (Signed)
Pt agreeable to plan of care for tele pre op appt 07/11/22 @ 10 am. Echo has been moved up to 07/08/22. Med rec and consent are done.

## 2022-04-18 NOTE — Telephone Encounter (Signed)
Pt agreeable to plan of care for tele pre op appt 07/11/22 @ 10 am. Echo has been moved up to 07/08/22. Med rec and consent are done.     Patient Consent for Virtual Visit        Deanna Simmons has provided verbal consent on 04/18/2022 for a virtual visit (video or telephone).   CONSENT FOR VIRTUAL VISIT FOR:  Deanna Simmons  By participating in this virtual visit I agree to the following:  I hereby voluntarily request, consent and authorize [facility] and its employed or contracted physicians, physician assistants, nurse practitioners or other licensed health care professionals (the Practitioner), to provide me with telemedicine health care services (the "Services") as deemed necessary by the treating Practitioner. I acknowledge and consent to receive the Services by the Practitioner via telemedicine. I understand that the telemedicine visit will involve communicating with the Practitioner through live audiovisual communication technology and the disclosure of certain medical information by electronic transmission. I acknowledge that I have been given the opportunity to request an in-person assessment or other available alternative prior to the telemedicine visit and am voluntarily participating in the telemedicine visit.  I understand that I have the right to withhold or withdraw my consent to the use of telemedicine in the course of my care at any time, without affecting my right to future care or treatment, and that the Practitioner or I may terminate the telemedicine visit at any time. I understand that I have the right to inspect all information obtained and/or recorded in the course of the telemedicine visit and may receive copies of available information for a reasonable fee.  I understand that some of the potential risks of receiving the Services via telemedicine include:  Delay or interruption in medical evaluation due to technological equipment failure or disruption; Information  transmitted may not be sufficient (e.g. poor resolution of images) to allow for appropriate medical decision making by the Practitioner; and/or  In rare instances, security protocols could fail, causing a breach of personal health information.  Furthermore, I acknowledge that it is my responsibility to provide information about my medical history, conditions and care that is complete and accurate to the best of my ability. I acknowledge that Practitioner's advice, recommendations, and/or decision may be based on factors not within their control, such as incomplete or inaccurate data provided by me or distortions of diagnostic images or specimens that may result from electronic transmissions. I understand that the practice of medicine is not an exact science and that Practitioner makes no warranties or guarantees regarding treatment outcomes. I acknowledge that a copy of this consent can be made available to me via my patient portal ([facility]), or I can request a printed copy by calling the office of [facility].    I understand that my insurance will be billed for this visit.   I have read or had this consent read to me. I understand the contents of this consent, which adequately explains the benefits and risks of the Services being provided via telemedicine.  I have been provided ample opportunity to ask questions regarding this consent and the Services and have had my questions answered to my satisfaction. I give my informed consent for the services to be provided through the use of telemedicine in my medical care

## 2022-04-18 NOTE — Telephone Encounter (Signed)
I s/w NP Christen Bame in regard to pt's echo 9/13 and procedure 9/18 and needs tele pre op appt somewhere in between and not having enough time for pt to hold her Plavix for procedure. NP advised the best process in this case would be let's see if we can move the echo up sooner, sometime between mid and end of August. Then schedule a tele pre op appt that will enough time for pt to hold her Plavix.   I have left a very detailed message for the pt of what we feel would be the best plan of care in getting echo and pre op clearance all done. I will send a message to our echo scheduler to see if she may be able to move echo up sooner. Once I see echo has been scheduled sooner, I will be call the pt and to schedule tele pre op appt.

## 2022-04-25 ENCOUNTER — Ambulatory Visit: Payer: 59 | Admitting: Interventional Cardiology

## 2022-04-29 ENCOUNTER — Telehealth: Payer: Self-pay

## 2022-04-29 NOTE — Telephone Encounter (Signed)
Received FMLA paperwork for patient.  Forwarding message to provider.

## 2022-05-05 ENCOUNTER — Telehealth: Payer: Self-pay

## 2022-05-05 NOTE — Telephone Encounter (Signed)
Disregard.  error

## 2022-05-10 NOTE — Telephone Encounter (Signed)
Called patient - DOB verified - advised FMLA is completed  - will be up front ready for p/u. Patient asked if we could faxed paperwork to [employer] - advised I would find out protocol/process from front office - she can check her myChart for update. Patient verbalized understanding, no further questions.

## 2022-05-12 ENCOUNTER — Other Ambulatory Visit (HOSPITAL_COMMUNITY): Payer: Self-pay

## 2022-05-14 ENCOUNTER — Other Ambulatory Visit (HOSPITAL_COMMUNITY): Payer: Self-pay

## 2022-05-16 ENCOUNTER — Other Ambulatory Visit (HOSPITAL_COMMUNITY): Payer: Self-pay

## 2022-05-16 ENCOUNTER — Telehealth: Payer: Self-pay

## 2022-05-16 DIAGNOSIS — I2510 Atherosclerotic heart disease of native coronary artery without angina pectoris: Secondary | ICD-10-CM | POA: Diagnosis not present

## 2022-05-16 DIAGNOSIS — D509 Iron deficiency anemia, unspecified: Secondary | ICD-10-CM | POA: Diagnosis not present

## 2022-05-16 DIAGNOSIS — Z8673 Personal history of transient ischemic attack (TIA), and cerebral infarction without residual deficits: Secondary | ICD-10-CM | POA: Diagnosis not present

## 2022-05-16 DIAGNOSIS — E1149 Type 2 diabetes mellitus with other diabetic neurological complication: Secondary | ICD-10-CM | POA: Diagnosis not present

## 2022-05-16 DIAGNOSIS — G4733 Obstructive sleep apnea (adult) (pediatric): Secondary | ICD-10-CM | POA: Diagnosis not present

## 2022-05-16 DIAGNOSIS — Z952 Presence of prosthetic heart valve: Secondary | ICD-10-CM | POA: Diagnosis not present

## 2022-05-16 DIAGNOSIS — E785 Hyperlipidemia, unspecified: Secondary | ICD-10-CM | POA: Diagnosis not present

## 2022-05-16 DIAGNOSIS — E559 Vitamin D deficiency, unspecified: Secondary | ICD-10-CM | POA: Diagnosis not present

## 2022-05-16 NOTE — Telephone Encounter (Signed)
Patient went to pharmacy to pick up Qvar and was advise this is not covered by her insurance. Spoke with pharmacist and ins co prefers Flovent and Arnuity. Advised we had sent in [place] and pharmacy stated it wasn't covered. Pharmacist re-ran Arnuity and there was a quantity limit issue, they have corrected this and rx is covered. Qvar has been canceled with pharmacy. Patient is aware of change. Pharmacy is aware patient uses Arnuity in addition to Advair when having asthma flare. Nothing further needed at this time.

## 2022-05-16 NOTE — Addendum Note (Signed)
Addended by: Wadie Lessen on: 05/16/2022 04:31 PM   Modules accepted: Orders

## 2022-05-16 NOTE — Telephone Encounter (Signed)
Patient called to state she never went to pharmacy to get Arnuity so she was not aware it had been replaced by Qvar. She wants to know if she is to take the Qvar in times of crisis or should she be taking this every day. She states she is taking the Advair as directed, 1 puff BID. She reports she only used the Arnuity when she was "in crisis".   Please advise, thank you!

## 2022-05-16 NOTE — Telephone Encounter (Signed)
Spoke with patient and she is aware of Qvar usage. She will pick up Qvar rx today. Nothing further needed at this time.

## 2022-05-17 ENCOUNTER — Other Ambulatory Visit (HOSPITAL_COMMUNITY): Payer: Self-pay

## 2022-05-24 ENCOUNTER — Ambulatory Visit: Payer: 59 | Admitting: Allergy and Immunology

## 2022-05-24 ENCOUNTER — Other Ambulatory Visit (HOSPITAL_COMMUNITY): Payer: Self-pay

## 2022-05-24 VITALS — BP 90/60 | HR 75 | Temp 98.5°F | Ht 63.0 in | Wt 223.0 lb

## 2022-05-24 DIAGNOSIS — J4540 Moderate persistent asthma, uncomplicated: Secondary | ICD-10-CM | POA: Diagnosis not present

## 2022-05-24 DIAGNOSIS — J302 Other seasonal allergic rhinitis: Secondary | ICD-10-CM

## 2022-05-24 DIAGNOSIS — M65311 Trigger thumb, right thumb: Secondary | ICD-10-CM | POA: Insufficient documentation

## 2022-05-24 DIAGNOSIS — I359 Nonrheumatic aortic valve disorder, unspecified: Secondary | ICD-10-CM | POA: Insufficient documentation

## 2022-05-24 DIAGNOSIS — N059 Unspecified nephritic syndrome with unspecified morphologic changes: Secondary | ICD-10-CM | POA: Insufficient documentation

## 2022-05-24 DIAGNOSIS — I639 Cerebral infarction, unspecified: Secondary | ICD-10-CM | POA: Insufficient documentation

## 2022-05-24 DIAGNOSIS — K219 Gastro-esophageal reflux disease without esophagitis: Secondary | ICD-10-CM

## 2022-05-24 DIAGNOSIS — J3089 Other allergic rhinitis: Secondary | ICD-10-CM

## 2022-05-24 DIAGNOSIS — R252 Cramp and spasm: Secondary | ICD-10-CM | POA: Insufficient documentation

## 2022-05-24 DIAGNOSIS — E559 Vitamin D deficiency, unspecified: Secondary | ICD-10-CM | POA: Insufficient documentation

## 2022-05-24 DIAGNOSIS — J309 Allergic rhinitis, unspecified: Secondary | ICD-10-CM | POA: Insufficient documentation

## 2022-05-24 DIAGNOSIS — K30 Functional dyspepsia: Secondary | ICD-10-CM | POA: Insufficient documentation

## 2022-05-24 DIAGNOSIS — G4733 Obstructive sleep apnea (adult) (pediatric): Secondary | ICD-10-CM | POA: Insufficient documentation

## 2022-05-24 DIAGNOSIS — I25708 Atherosclerosis of coronary artery bypass graft(s), unspecified, with other forms of angina pectoris: Secondary | ICD-10-CM

## 2022-05-24 DIAGNOSIS — Z952 Presence of prosthetic heart valve: Secondary | ICD-10-CM | POA: Insufficient documentation

## 2022-05-24 DIAGNOSIS — D509 Iron deficiency anemia, unspecified: Secondary | ICD-10-CM | POA: Insufficient documentation

## 2022-05-24 DIAGNOSIS — I6529 Occlusion and stenosis of unspecified carotid artery: Secondary | ICD-10-CM | POA: Insufficient documentation

## 2022-05-24 DIAGNOSIS — B009 Herpesviral infection, unspecified: Secondary | ICD-10-CM | POA: Insufficient documentation

## 2022-05-24 DIAGNOSIS — R350 Frequency of micturition: Secondary | ICD-10-CM | POA: Insufficient documentation

## 2022-05-24 DIAGNOSIS — I38 Endocarditis, valve unspecified: Secondary | ICD-10-CM | POA: Insufficient documentation

## 2022-05-24 DIAGNOSIS — H349 Unspecified retinal vascular occlusion: Secondary | ICD-10-CM | POA: Insufficient documentation

## 2022-05-24 MED ORDER — SPIRIVA RESPIMAT 1.25 MCG/ACT IN AERS
2.0000 | INHALATION_SPRAY | Freq: Every day | RESPIRATORY_TRACT | 5 refills | Status: DC
  Filled 2022-05-24 – 2022-06-09 (×2): qty 4, 30d supply, fill #0

## 2022-05-24 MED ORDER — ALBUTEROL SULFATE HFA 108 (90 BASE) MCG/ACT IN AERS
INHALATION_SPRAY | RESPIRATORY_TRACT | 1 refills | Status: DC
  Filled 2022-05-24: qty 18, fill #0
  Filled 2022-11-04: qty 6.7, 16d supply, fill #0
  Filled 2022-11-28: qty 6.7, 16d supply, fill #1

## 2022-05-24 MED ORDER — FLUTICASONE PROPIONATE 50 MCG/ACT NA SUSP
1.0000 | Freq: Every day | NASAL | 1 refills | Status: DC
  Filled 2022-05-24: qty 16, 60d supply, fill #0

## 2022-05-24 MED ORDER — CETIRIZINE HCL 10 MG PO TABS
10.0000 mg | ORAL_TABLET | Freq: Every day | ORAL | 5 refills | Status: DC | PRN
  Filled 2022-05-24: qty 60, 60d supply, fill #0
  Filled 2022-10-06: qty 60, 30d supply, fill #0

## 2022-05-24 MED ORDER — ARNUITY ELLIPTA 200 MCG/ACT IN AEPB
1.0000 | INHALATION_SPRAY | Freq: Every day | RESPIRATORY_TRACT | 5 refills | Status: DC
  Filled 2022-05-24: qty 60, 60d supply, fill #0

## 2022-05-24 MED ORDER — FLUTICASONE-SALMETEROL 250-50 MCG/ACT IN AEPB
1.0000 | INHALATION_SPRAY | Freq: Two times a day (BID) | RESPIRATORY_TRACT | 5 refills | Status: DC
  Filled 2022-05-24 – 2022-06-21 (×2): qty 60, 30d supply, fill #0
  Filled 2022-08-16: qty 60, 30d supply, fill #1

## 2022-05-24 MED ORDER — FAMOTIDINE 40 MG PO TABS
40.0000 mg | ORAL_TABLET | Freq: Every day | ORAL | 1 refills | Status: DC
  Filled 2022-05-24 – 2022-08-16 (×2): qty 90, 90d supply, fill #0

## 2022-05-24 NOTE — Progress Notes (Unsigned)
[facility]   Follow-up Note  Referring Provider: Josetta Huddle, MD Primary Provider: Josetta Huddle, MD Date of Office Visit: 05/24/2022  Subjective:   Deanna Simmons (DOB: [birth year]) is a 71 y.o. female who returns to the [facility] on 05/24/2022 in re-evaluation of the following:  HPI: Deanna Simmons returns to this clinic in evaluation of asthma, allergic rhinitis, LPR.  Her last visit to this clinic was 01 Mar 2022.  She has noticed over the course of the past 2 weeks that she has developed a little bit more problem with her asthma since the ear has become quite polluted and that he has increased.  She had wheezing and coughing.  She increased her use of Advair to twice a day and this certainly helped her significantly and then she decreased it to once a day because she was doing a lot better as part of her "action plan".  But last night she had lots of shortness of breath and had use her short acting bronchodilator again with a good response.  Her reflux has been under excellent control while using famotidine.  She had very little problems with her upper airways.  Allergies as of 05/24/2022       Reactions   Biaxin [clarithromycin] Itching   Cardiolite [technetium-84m Itching   Ciprofloxacin Itching   Erythromycin Base Itching   Montelukast Sodium Itching   Niaspan [niacin Er] Other (See Comments)   flushing   Penicillins Itching   Reaction: over 10 years   Prilosec [omeprazole] Itching   Singulair [montelukast] Hives, Itching   Ticlid [ticlopidine Hcl] Itching   Iodinated Contrast Media Itching   Pt has a potential contrast allergy; She states in 2012 her cardiologist told her she had a reaction during the "study". We are unable to determine the study or confirm a reaction. The patient will have a 13 hr prep for future studies. [facility] states their injection does not cause any reactions.         Medication List    acetaminophen 500 MG tablet Commonly known as: TYLENOL Take 1,000 mg by mouth every 6 (six) hours as needed for mild pain.   Advair Diskus 250-50 MCG/ACT Aepb Generic drug: fluticasone-salmeterol Inhale 1 puff into the lungs in the morning and at bedtime.   albuterol 108 (90 Base) MCG/ACT inhaler Commonly known as: Ventolin HFA INHALE 2 PUFFS BY MOUTH EVERY 4-6 HOURS AS NEEDED FOR COUGH OR WHEEZE   ALPRAZolam 0.25 MG tablet Commonly known as: XANAX Take 1 tablet (0.25 mg total) by mouth 2 (two) times daily as needed for anxiety   Arnuity Ellipta 200 MCG/ACT Aepb Generic drug: Fluticasone Furoate Inhale 1 puff into the lungs daily.   azithromycin 500 MG tablet Commonly known as: Zithromax Take one tablet by mouth1 hour before any dental work including cleanings.   cetirizine 10 MG tablet Commonly known as: ZYRTEC Take 1 tablet (10 mg total) by mouth 2 (two) times daily as needed for allergies (Can use an extra dose during flares).   clobetasol 0.05 % external solution Commonly known as: TEMOVATE Apply once daily to the affected area(s) as directed Monday through Friday   clopidogrel 75 MG tablet Commonly known as: PLAVIX Take 1 tablet (75 mg total) by mouth daily.   clotrimazole 1 % cream Commonly known as: Clotrimazole Anti-Fungal APPLY TO THE AFFECTED AND SURROUNDING AREAS OF SKIN BY TOPICAL ROUTE 2 TIMES PER DAY IN THE MORNING  AND EVENING   Estradiol 10 MCG Tabs vaginal tablet INSERT 1 TABLET TWICE A WEEK VAGINALLY AS DIRECTED   ezetimibe 10 MG tablet Commonly known as: ZETIA Take 1 tablet (10 mg total) by mouth daily.   famotidine 40 MG tablet Commonly known as: PEPCID Take 1 tablet (40 mg total) by mouth daily.   Farxiga 10 MG Tabs tablet Generic drug: dapagliflozin propanediol Take 1 tablet by mouth daily   ferrous sulfate 325 (65 FE) MG tablet Take 325 mg by mouth daily.   fluticasone 50 MCG/ACT nasal spray Commonly known as:  FLONASE Place 1 spray into both nostrils daily.   glucose blood test strip Use to test blood glucose twice daily before meals.   magnesium gluconate 500 MG tablet Commonly known as: MAGONATE Take 500 mg by mouth daily as needed (cramps).   metoprolol succinate 25 MG 24 hr tablet Commonly known as: TOPROL-XL TAKE 1 TABLET BY MOUTH TWICE DAILY   MULTIVITAL PO Take 1 tablet by mouth daily.   nitroGLYCERIN 0.4 MG SL tablet Commonly known as: NITROSTAT Place 1 tablet (0.4 mg total) under the tongue every 5 (five) minutes as needed for up to 25 days for chest pain.   PROBIOTIC DAILY PO Take 1 capsule by mouth at bedtime. Ultra Flora IB   rosuvastatin 40 MG tablet Commonly known as: CRESTOR Take 1 tablet (40 mg total) by mouth daily.   valACYclovir 500 MG tablet Commonly known as: VALTREX TAKE 1 TABLET BY MOUTH TWICE DAILY AS NEEDED   vitamin C 500 MG tablet Commonly known as: ASCORBIC ACID Take 500 mg by mouth daily.   VITAMIN K2-VITAMIN D3 PO Take 1 tablet by mouth every other day.   zinc gluconate 50 MG tablet Take 50 mg by mouth every evening.    Past Medical History:  Diagnosis Date   Abnormal finding on Pap smear, ASCUS    Anemia    Asthma    Chondromalacia of knee    Coronary artery disease    Diabetes mellitus without complication ([place])    Diverticulum 12/11/2002   in cecum   Fibroids    h/o   GERD (gastroesophageal reflux disease)    H/O tinea cruris 02/28/2010   History of CVA (cerebrovascular accident)    Hypercholesteremia    Hypertension    Obesity    Pneumonia 07/01/2008   S/P TAVR (transcatheter aortic valve replacement) 07/13/2021   s/p TAVR w/ a 52m Edwards S3U via the TF approach by Dr. CBurt Knackand Dr. BCyndia Bent   Synovitis of knee    [place]    with nephritis   Vulvitis    h/o    Past Surgical History:  Procedure Laterality Date   CARDIAC CATHETERIZATION     CESAREAN SECTION     CORONARY ARTERY BYPASS  GRAFT     Endometrial curettage  03/29/2000   INTRAOPERATIVE TRANSTHORACIC ECHOCARDIOGRAM Left 07/13/2021   Procedure: INTRAOPERATIVE TRANSTHORACIC ECHOCARDIOGRAM;  Surgeon: CSherren Mocha MD;  [facility]  Service: Open Heart Surgery;  Laterality: Left;   RIGHT/LEFT HEART CATH AND CORONARY/GRAFT ANGIOGRAPHY N/A 06/11/2021   Procedure: RIGHT/LEFT HEART CATH AND CORONARY/GRAFT ANGIOGRAPHY;  Surgeon: SBelva Crome MD;  [facility];  Service: Cardiovascular;  Laterality: N/A;   ROTATOR CUFF REPAIR     TRANSCATHETER AORTIC VALVE REPLACEMENT, TRANSFEMORAL N/A 07/13/2021   Procedure: TRANSCATHETER AORTIC VALVE REPLACEMENT, TRANSFEMORAL;  Surgeon: CSherren Mocha MD;  [facility]  Service: Open Heart Surgery;  Laterality:  N/A;   TUBAL LIGATION     ULTRASOUND GUIDANCE FOR VASCULAR ACCESS Bilateral 07/13/2021   Procedure: ULTRASOUND GUIDANCE FOR VASCULAR ACCESS;  Surgeon: Sherren Mocha, MD;  Location: [facility];  Service: Open Heart Surgery;  Laterality: Bilateral;   UPPER GASTROINTESTINAL ENDOSCOPY  12/11/2002    Review of systems negative except as noted in HPI / PMHx or noted below:  Review of Systems  Constitutional: Negative.   HENT: Negative.    Eyes: Negative.   Respiratory: Negative.    Cardiovascular: Negative.   Gastrointestinal: Negative.   Genitourinary: Negative.   Musculoskeletal: Negative.   Skin: Negative.   Neurological: Negative.   Endo/Heme/Allergies: Negative.   Psychiatric/Behavioral: Negative.       Objective:   Vitals:   05/24/22 1534  BP: 90/60  Pulse: 75  Temp: 98.5 F (36.9 C)  SpO2: 98%   Height: '5\' 3"'$  (160 cm)  Weight: 223 lb (101.2 kg)   Physical Exam Constitutional:      Appearance: She is not diaphoretic.  HENT:     Head: Normocephalic.     Right Ear: Tympanic membrane, ear canal and external ear normal.     Left Ear: Tympanic membrane, ear canal and external ear normal.     Nose: Nose normal. No mucosal edema or  rhinorrhea.     Mouth/Throat:     Pharynx: Uvula midline. No oropharyngeal exudate.  Eyes:     Conjunctiva/sclera: Conjunctivae normal.  Neck:     Thyroid: No thyromegaly.     Trachea: Trachea normal. No tracheal tenderness or tracheal deviation.  Cardiovascular:     Rate and Rhythm: Normal rate and regular rhythm.     Heart sounds: Normal heart sounds, S1 normal and S2 normal. No murmur heard. Pulmonary:     Effort: No respiratory distress.     Breath sounds: Normal breath sounds. No stridor. No wheezing or rales.  Lymphadenopathy:     Head:     Right side of head: No tonsillar adenopathy.     Left side of head: No tonsillar adenopathy.     Cervical: No cervical adenopathy.  Skin:    Findings: No erythema or rash.     Nails: There is no clubbing.  Neurological:     Mental Status: She is alert.     Diagnostics:    Spirometry was performed and demonstrated an FEV1 of 1.59 at 88 % of predicted.  Assessment and Plan:   No diagnosis found.  1.  Continue Advair 250 1 inhalation 1-2 times per day depending on disease activity  2.  Start Spiriva 1.25 Respimat - 2 inhalations 1 time per day  2.  Continue Flonase 1 spray each nostril 3-7 times per week depending on disease activity  3. Continue Famotidine 40 mg tablet 1 time per day  4.  "Action plan" for asthma flare:   A. Add Arnuity 200 - 1 inhalation 2 times per day  B. Use Albuterol HFA -2 puffs every 4-6 hours if needed  C. Increase famotidine to 40 mg 2 times per day  5. Zyrtec 10 mg one tablet one time per day if needed  6. Return to clinic in 6 months or earlier if problem  7. Obtain fall flu vaccine and RSV vaccine  8. Use spinal anesthetic with upcoming total knee replacement        Allena Katz, MD [facility]

## 2022-05-24 NOTE — Patient Instructions (Signed)
  1.  Continue Advair 250 1 inhalation 1-2 times per day depending on disease activity  2.  Start Spiriva 1.25 Respimat - 2 inhalations 1 time per day  2.  Continue Flonase 1 spray each nostril 3-7 times per week depending on disease activity  3. Continue Famotidine 40 mg tablet 1 time per day  4.  "Action plan" for asthma flare:   A. Add Arnuity 200 - 1 inhalation 2 times per day  B. Use Albuterol HFA -2 puffs every 4-6 hours if needed  C. Increase famotidine to 40 mg 2 times per day  5. Zyrtec 10 mg one tablet one time per day if needed  6. Return to clinic in 6 months or earlier if problem  7. Obtain fall flu vaccine and RSV vaccine  8. Use spinal anesthetic with upcoming total knee replacement

## 2022-05-25 ENCOUNTER — Encounter: Payer: Self-pay | Admitting: Allergy and Immunology

## 2022-05-25 ENCOUNTER — Other Ambulatory Visit (HOSPITAL_COMMUNITY): Payer: Self-pay

## 2022-05-30 ENCOUNTER — Other Ambulatory Visit (HOSPITAL_COMMUNITY): Payer: Self-pay

## 2022-06-09 ENCOUNTER — Other Ambulatory Visit (HOSPITAL_COMMUNITY): Payer: Self-pay

## 2022-06-10 ENCOUNTER — Other Ambulatory Visit (HOSPITAL_COMMUNITY): Payer: Self-pay

## 2022-06-11 IMAGING — CR DG CHEST 2V
2 series · 2 of 2 positions shown · non-contrast
Comparison: [address] hundred twenty[age]five 1,021

CLINICAL DATA: Preoperative, TAVR

EXAM:
CHEST - 2 VIEW

[w chest pa]
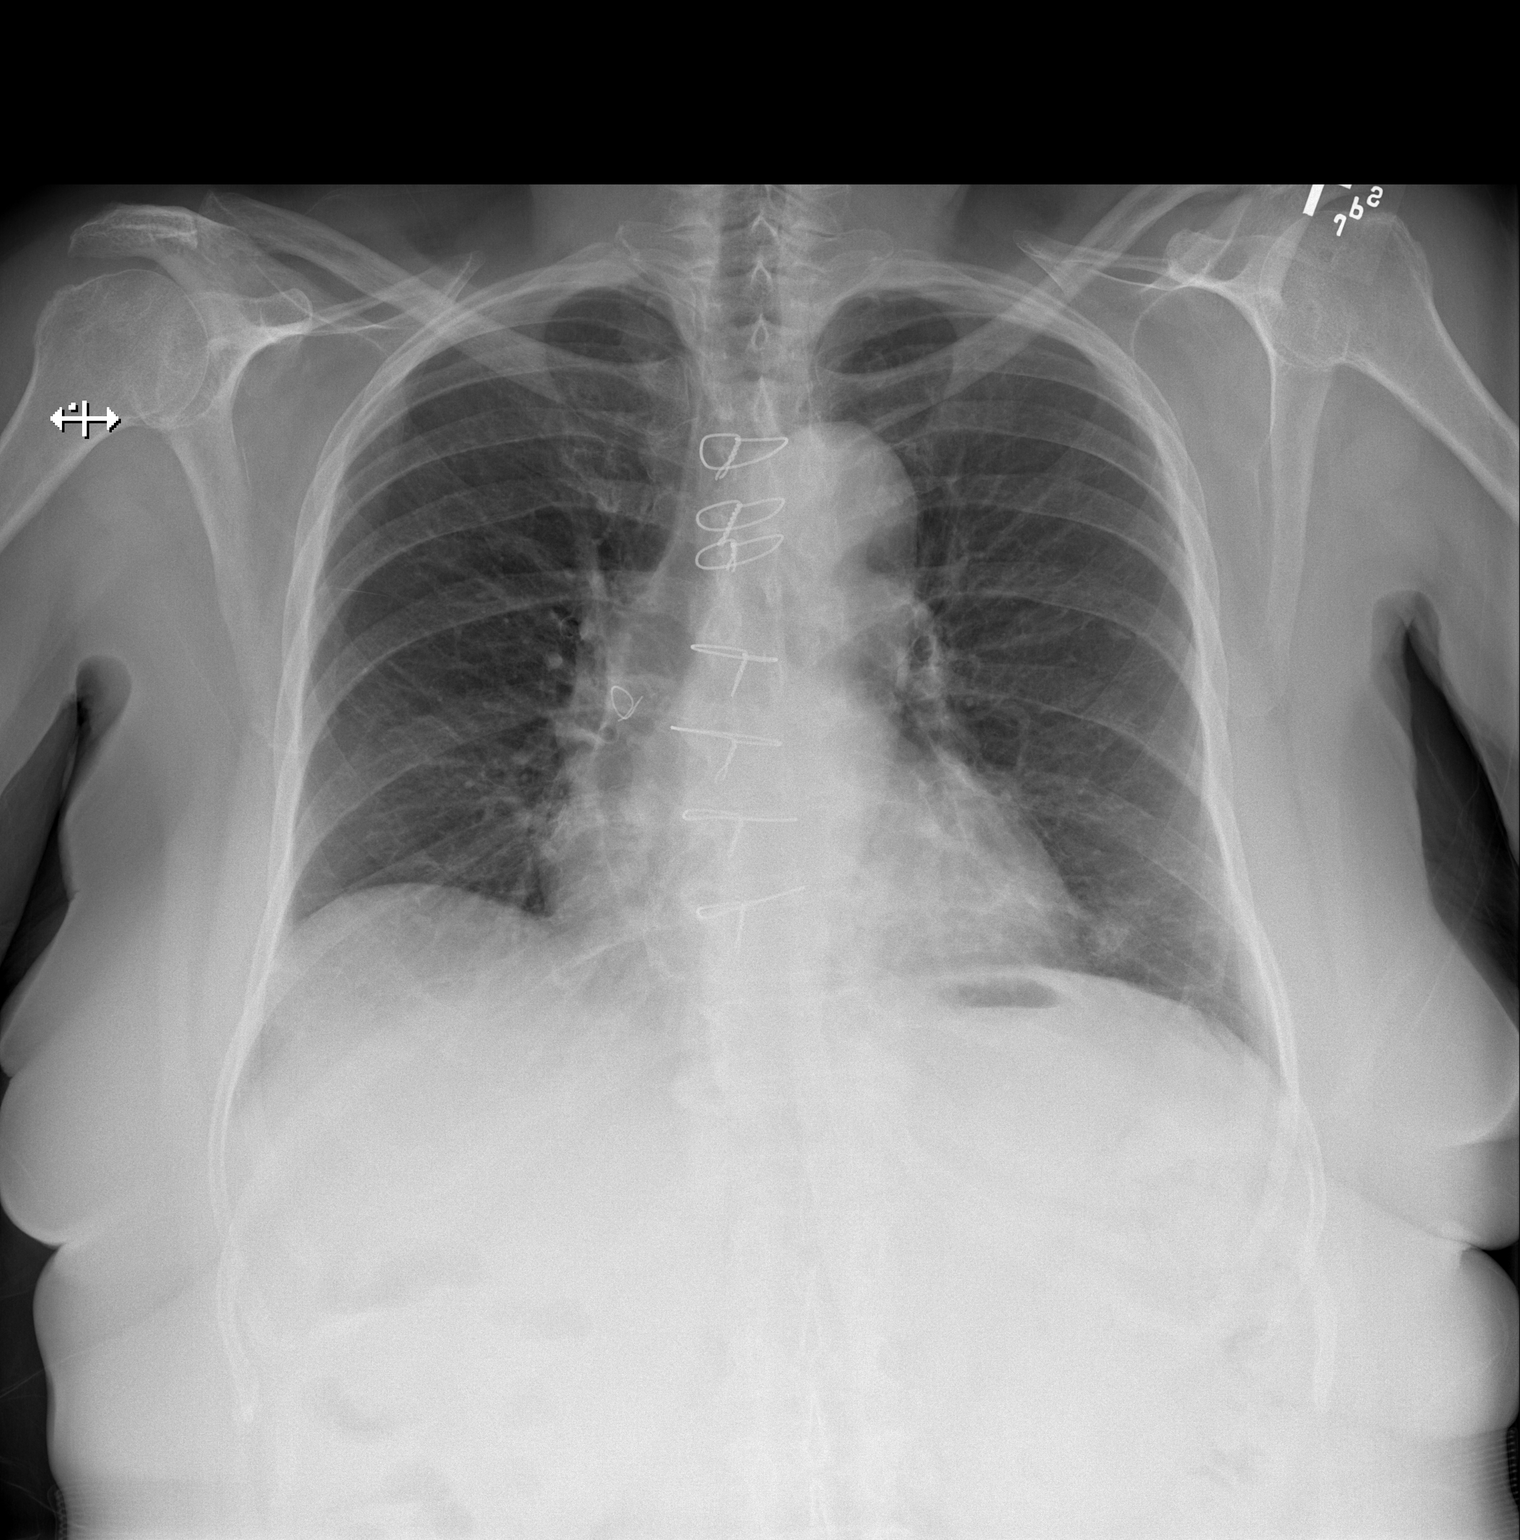

[w chest lat]
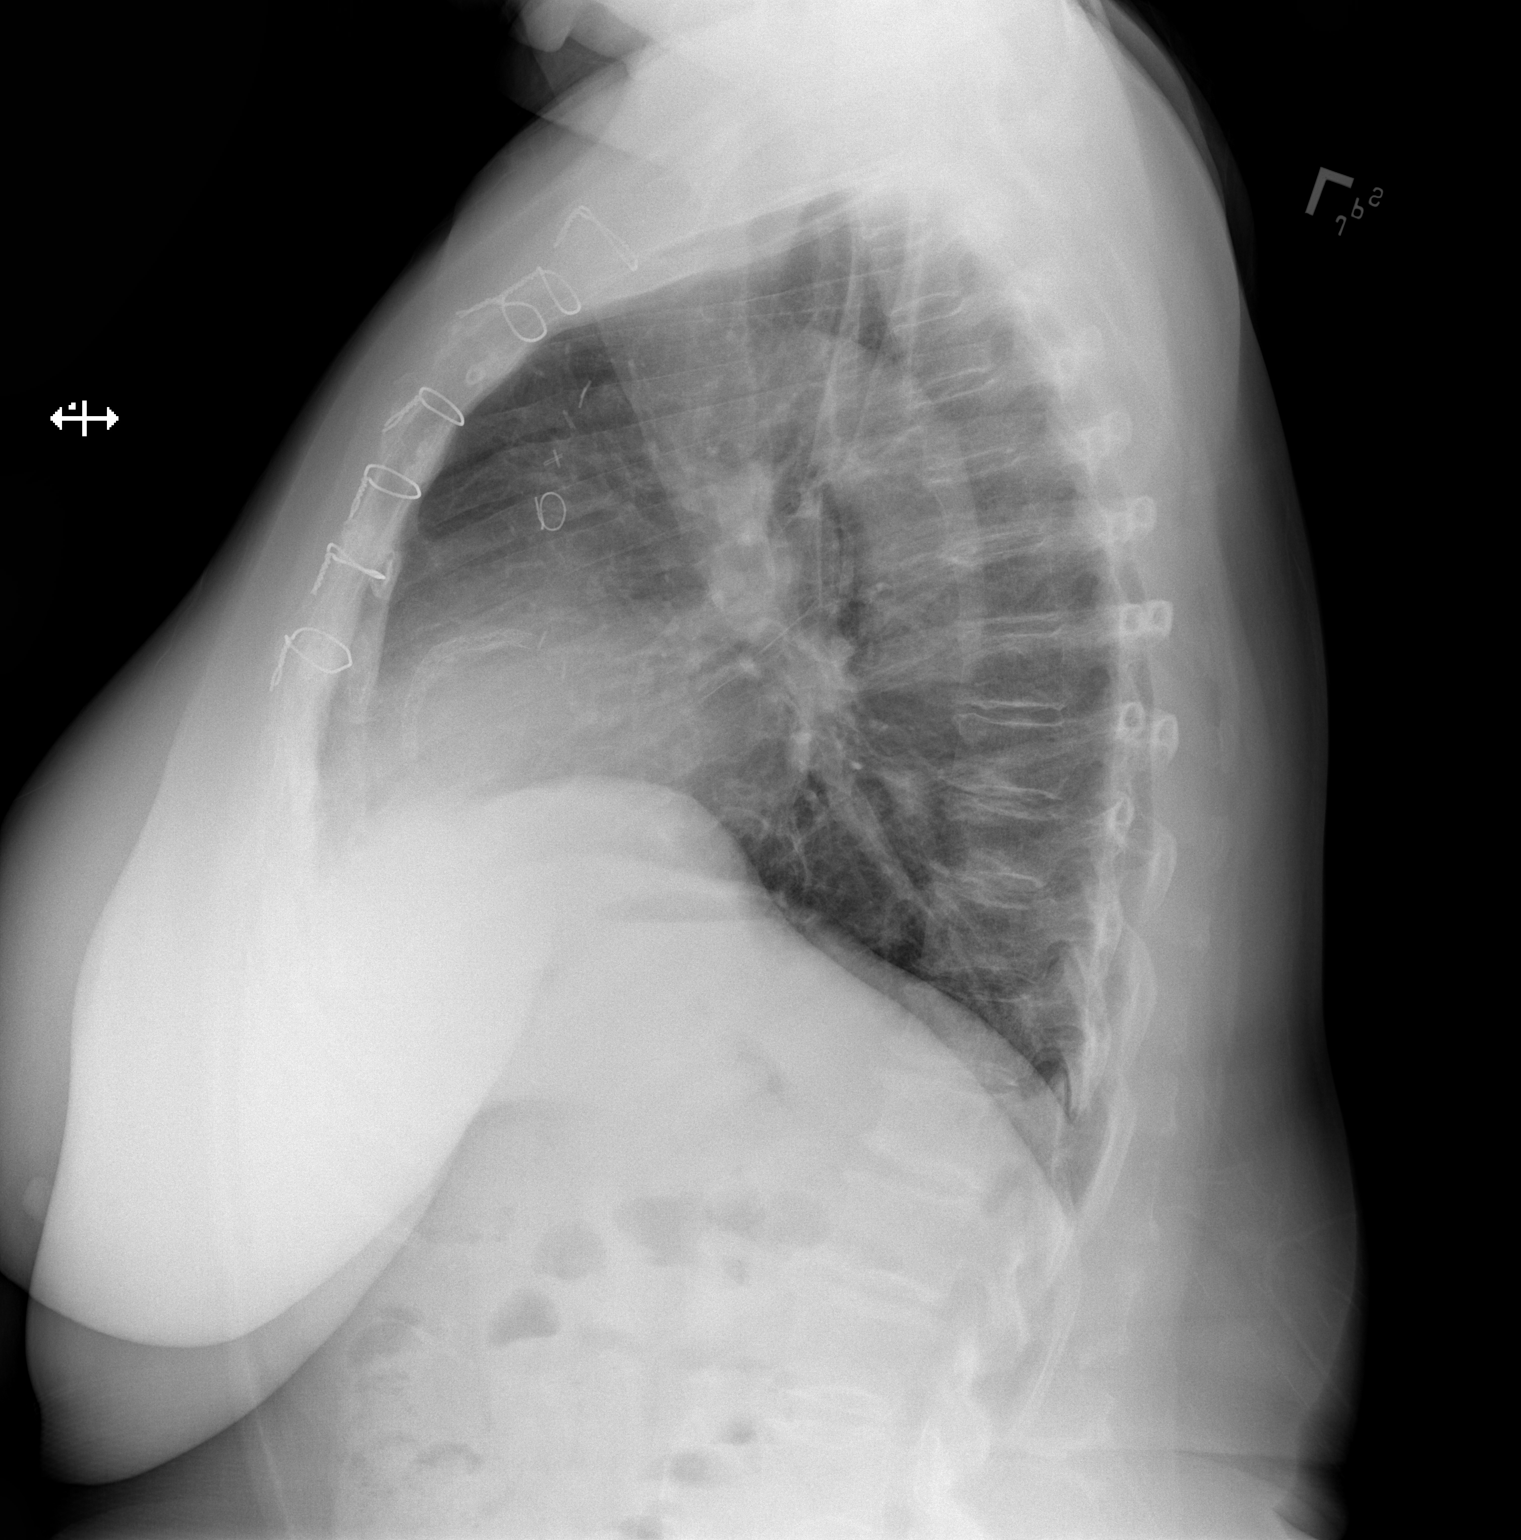

[2 of 2 positions shown; findings below may reference images not displayed]

FINDINGS: Heart size is normal. Status post median sternotomy. Both lungs are
clear. Disc degenerative disease of the thoracic spine.
IMPRESSION: No acute abnormality of the lungs.

## 2022-06-13 ENCOUNTER — Other Ambulatory Visit (HOSPITAL_COMMUNITY): Payer: Self-pay

## 2022-06-14 DIAGNOSIS — I87393 Chronic venous hypertension (idiopathic) with other complications of bilateral lower extremity: Secondary | ICD-10-CM | POA: Diagnosis not present

## 2022-06-21 ENCOUNTER — Other Ambulatory Visit (HOSPITAL_COMMUNITY): Payer: Self-pay

## 2022-06-23 DIAGNOSIS — M79605 Pain in left leg: Secondary | ICD-10-CM | POA: Diagnosis not present

## 2022-06-23 DIAGNOSIS — M79604 Pain in right leg: Secondary | ICD-10-CM | POA: Diagnosis not present

## 2022-06-23 DIAGNOSIS — I872 Venous insufficiency (chronic) (peripheral): Secondary | ICD-10-CM | POA: Diagnosis not present

## 2022-07-05 ENCOUNTER — Other Ambulatory Visit: Payer: Self-pay | Admitting: Interventional Cardiology

## 2022-07-05 ENCOUNTER — Other Ambulatory Visit (HOSPITAL_COMMUNITY): Payer: 59

## 2022-07-05 ENCOUNTER — Other Ambulatory Visit (HOSPITAL_COMMUNITY): Payer: Self-pay

## 2022-07-06 MED ORDER — EZETIMIBE 10 MG PO TABS
10.0000 mg | ORAL_TABLET | Freq: Every day | ORAL | 2 refills | Status: DC
  Filled 2022-07-06: qty 90, 90d supply, fill #0
  Filled 2022-10-06: qty 90, 90d supply, fill #1
  Filled 2023-03-03: qty 90, 90d supply, fill #2

## 2022-07-06 MED ORDER — ROSUVASTATIN CALCIUM 40 MG PO TABS
40.0000 mg | ORAL_TABLET | Freq: Every day | ORAL | 2 refills | Status: DC
  Filled 2022-07-06: qty 90, 90d supply, fill #0
  Filled 2022-10-06: qty 90, 90d supply, fill #1
  Filled 2023-01-09: qty 90, 90d supply, fill #2

## 2022-07-07 ENCOUNTER — Other Ambulatory Visit (HOSPITAL_COMMUNITY): Payer: Self-pay

## 2022-07-08 ENCOUNTER — Ambulatory Visit (HOSPITAL_COMMUNITY): Payer: 59 | Attending: Physician Assistant

## 2022-07-08 DIAGNOSIS — Z952 Presence of prosthetic heart valve: Secondary | ICD-10-CM | POA: Insufficient documentation

## 2022-07-08 LAB — ECHOCARDIOGRAM COMPLETE
AV Mean grad: 14 mmHg
AV Peak grad: 26.2 mmHg
Ao pk vel: 2.56 m/s
Area-P 1/2: 2.85 cm2
S' Lateral: 2.1 cm

## 2022-07-11 ENCOUNTER — Ambulatory Visit: Payer: 59 | Attending: Cardiology | Admitting: Physician Assistant

## 2022-07-11 DIAGNOSIS — Z01810 Encounter for preprocedural cardiovascular examination: Secondary | ICD-10-CM | POA: Diagnosis not present

## 2022-07-11 NOTE — Progress Notes (Signed)
Virtual Visit via Telephone Note   Because of Deanna Simmons's co-morbid illnesses, she is at least at moderate risk for complications without adequate follow up.  This format is felt to be most appropriate for this patient at this time.  The patient did not have access to video technology/had technical difficulties with video requiring transitioning to audio format only (telephone).  All issues noted in this document were discussed and addressed.  No physical exam could be performed with this format.  Please refer to the patient's chart for her consent to telehealth for [facility].  Evaluation Performed:  Preoperative cardiovascular risk assessment _____________   Date:  07/11/2022   Patient ID:  Deanna Simmons, DOB 11-Apr-1951, MRN [id number] Patient Location:  Home Provider location:   Office  Primary Care Provider:  Josetta Huddle, MD Primary Cardiologist:  Sinclair Grooms, MD  Chief Complaint / Patient Profile   71 y.o. y/o female with a h/o CAD status post remote CABG x1 in 2004 by Dr. Cyndia Bent with SVG to PDA, hypertension, asthma, type 2 diabetes, SLE, prior stroke with right retinal artery occlusion and more recent TIA in June 2022 and severe AS status post TAVR 07/13/2021 who is pending left total knee arthroplasty and presents today for telephonic preoperative cardiovascular risk assessment.  Past Medical History    Past Medical History:  Diagnosis Date   Abnormal finding on Pap smear, ASCUS    Anemia    Asthma    Chondromalacia of knee    Coronary artery disease    Diabetes mellitus without complication ([place])    Diverticulum 12/11/2002   in cecum   Fibroids    h/o   GERD (gastroesophageal reflux disease)    H/O tinea cruris 02/28/2010   History of CVA (cerebrovascular accident)    Hypercholesteremia    Hypertension    Obesity    Pneumonia 07/01/2008   S/P TAVR (transcatheter aortic valve replacement) 07/13/2021   s/p TAVR w/ a 58m Edwards S3U via  the TF approach by Dr. CBurt Knackand Dr. BCyndia Bent   Synovitis of knee    [place]ic [place]mat[place]    with nephritis   Vulvitis    h/o   Past Surgical History:  Procedure Laterality Date   CARDIAC CATHETERIZATION     CESAREAN SECTION     CORONARY ARTERY BYPASS GRAFT     Endometrial curettage  03/29/2000   INTRAOPERATIVE TRANSTHORACIC ECHOCARDIOGRAM Left 07/13/2021   Procedure: INTRAOPERATIVE TRANSTHORACIC ECHOCARDIOGRAM;  Surgeon: CSherren Mocha MD;  [place] [facility]  Service: Open Heart Surgery;  Laterality: Left;   RIGHT/LEFT HEART CATH AND CORONARY/GRAFT ANGIOGRAPHY N/A 06/11/2021   Procedure: RIGHT/LEFT HEART CATH AND CORONARY/GRAFT ANGIOGRAPHY;  Surgeon: SBelva Crome MD;  [facility];  Service: Cardiovascular;  Laterality: N/A;   ROTATOR CUFF REPAIR     TRANSCATHETER AORTIC VALVE REPLACEMENT, TRANSFEMORAL N/A 07/13/2021   Procedure: TRANSCATHETER AORTIC VALVE REPLACEMENT, TRANSFEMORAL;  Surgeon: CSherren Mocha MD;  [place] [facility]  Service: Open Heart Surgery;  Laterality: N/A;   TUBAL LIGATION     ULTRASOUND GUIDANCE FOR VASCULAR ACCESS Bilateral 07/13/2021   Procedure: ULTRASOUND GUIDANCE FOR VASCULAR ACCESS;  Surgeon: CSherren Mocha MD;  [place] [facility]  Service: Open Heart Surgery;  Laterality: Bilateral;   UPPER GASTROINTESTINAL ENDOSCOPY  12/11/2002    Allergies  Allergies  Allergen Reactions   Biaxin [Clarithromycin] Itching   Cardiolite [Technetium-96mItching   Ciprofloxacin Itching   Erythromycin Base Itching  Montelukast Sodium Itching   Niaspan [Niacin Er] Other (See Comments)    flushing   Penicillins Itching    Reaction: over 10 years   Prilosec [Omeprazole] Itching   Singulair [Montelukast] Hives and Itching   Ticlid [Ticlopidine Hcl] Itching   Iodinated Contrast Media Itching    Pt has a potential contrast allergy; She states in 2012 her cardiologist told her she had a reaction during the "study". We are unable to  determine the study or confirm a reaction. The patient will have a 13 hr prep for future studies. Molecular Imaging states their injection does not cause any reactions.    History of Present Illness    [name] is a 71 y.o. female who presents via audio/video conferencing for a telehealth visit today.  Pt was last seen in cardiology clinic on 02/07/2022 by Dr. Tamala Julian.  At that time Deanna Simmons was doing well .  The patient is now pending procedure as outlined above. Since her last visit, she states that she has been working at [employer] as a Engineer, manufacturing.  She has no issues with walking or going up a flight of stairs.  She is able to do light and moderate housework.  She is scored a 5.07 METS on the DASI.  This exceeds the 4 METS minimum requirement.  She has not had any shortness of breath or chest pains.  She does have history of asthma which has been stable.  She does work outside in her flower beds and has joined a Horticulturist, commercial and tries to go twice a week.  Per Dr. Tamala Julian.  It she may hold her Plavix 5 to 7 days prior to the upcoming stage.  Please restart when medically safe to do so.  Home Medications    Prior to Admission medications   Medication Sig Start Date End Date Taking? Authorizing Provider  acetaminophen (TYLENOL) 500 MG tablet Take 1,000 mg by mouth every 6 (six) hours as needed for mild pain.    [provider]  albuterol (VENTOLIN HFA) 108 (90 Base) MCG/ACT inhaler INHALE 2 PUFFS BY MOUTH EVERY 4-6 HOURS AS NEEDED FOR COUGH OR WHEEZE 05/24/22   Kozlow, Donnamarie Poag, MD  azithromycin (ZITHROMAX) 500 MG tablet Take one tablet by mouth1 hour before any dental work including cleanings. 04/12/22   Eileen Stanford, PA-C  cetirizine (ZYRTEC) 10 MG tablet Take 1 tablet (10 mg total) by mouth daily as needed for allergies (Can use an extra dose during flares). 05/24/22   Kozlow, Donnamarie Poag, MD  clopidogrel (PLAVIX) 75 MG tablet Take 1 tablet (75 mg total) by mouth  daily. 02/07/22   Belva Crome, MD  dapagliflozin propanediol (FARXIGA) 10 MG TABS tablet Take 1 tablet by mouth daily 04/04/22     Estradiol 10 MCG TABS vaginal tablet INSERT 1 TABLET TWICE A WEEK VAGINALLY AS DIRECTED 09/13/21     ezetimibe (ZETIA) 10 MG tablet Take 1 tablet (10 mg total) by mouth daily. 07/06/22   Belva Crome, MD  famotidine (PEPCID) 40 MG tablet Take 1 tablet (40 mg total) by mouth daily. 05/24/22   Kozlow, Donnamarie Poag, MD  ferrous sulfate 325 (65 FE) MG tablet Take 325 mg by mouth daily.    [provider]  fluticasone (FLONASE) 50 MCG/ACT nasal spray Place 1 spray into both nostrils daily. 05/24/22   Kozlow, Donnamarie Poag, MD  Fluticasone Furoate (ARNUITY ELLIPTA) 200 MCG/ACT AEPB Inhale 1 puff into the lungs daily.  05/24/22   Kozlow, Donnamarie Poag, MD  fluticasone-salmeterol (ADVAIR) 250-50 MCG/ACT AEPB Inhale 1 puff into the lungs in the morning and at bedtime. 05/24/22   Kozlow, Donnamarie Poag, MD  glucose blood test strip Use to test blood glucose twice daily before meals. 02/07/22     magnesium gluconate (MAGONATE) 500 MG tablet Take 500 mg by mouth daily as needed (cramps).    [provider]  metoprolol succinate (TOPROL-XL) 25 MG 24 hr tablet TAKE 1 TABLET BY MOUTH TWICE DAILY 12/13/21     Multiple Vitamins-Minerals (MULTIVITAL PO) Take 1 tablet by mouth daily.    [provider]  nitroGLYCERIN (NITROSTAT) 0.4 MG SL tablet Place 1 tablet (0.4 mg total) under the tongue every 5 (five) minutes as needed for up to 25 days for chest pain. 07/14/21 05/24/22  Eileen Stanford, PA-C  Probiotic Product (PROBIOTIC DAILY PO) Take 1 capsule by mouth at bedtime. Ultra Flora IB    [provider]  rosuvastatin (CRESTOR) 40 MG tablet Take 1 tablet (40 mg total) by mouth daily. 07/06/22   Belva Crome, MD  Tiotropium Bromide Monohydrate (SPIRIVA RESPIMAT) 1.25 MCG/ACT AERS Inhale 2 puffs into the lungs daily. 05/24/22   Kozlow, Donnamarie Poag, MD  valACYclovir (VALTREX) 500 MG tablet TAKE  1 TABLET BY MOUTH TWICE DAILY AS NEEDED 07/26/21     vitamin C (ASCORBIC ACID) 500 MG tablet Take 500 mg by mouth daily.    [provider]  Vitamin D-Vitamin K (VITAMIN K2-VITAMIN D3 PO) Take 1 tablet by mouth every other day.    [provider]  zinc gluconate 50 MG tablet Take 50 mg by mouth every evening.    [provider]    Physical Exam    Vital Signs:  MARIELIZ STRANG does not have vital signs available for review today.  Given telephonic nature of communication, physical exam is limited. AAOx3. NAD. Normal affect.  Speech and respirations are unlabored.  Accessory Clinical Findings    None  Assessment & Plan    1.  Preoperative Cardiovascular Risk Assessment:  Ms. Binning perioperative risk of a major cardiac event is 0.9% according to the Revised Cardiac Risk Index (RCRI).  Therefore, she is at low risk for perioperative complications.   Her functional capacity is good at 5.07 METs according to the [place] Activity Status Index (DASI). Recommendations: According to ACC/AHA guidelines, no further cardiovascular testing needed.  The patient may proceed to surgery at acceptable risk.   Antiplatelet and/or Anticoagulation Recommendations: Clopidogrel (Plavix) can be held for 5 days prior to her surgery and resumed as soon as possible post op.   A copy of this note will be routed to requesting surgeon.  Time:   Today, I have spent 10 minutes with the patient with telehealth technology discussing medical history, symptoms, and management plan.     Elgie Collard, PA-C  07/11/2022, 9:14 AM

## 2022-07-12 NOTE — Progress Notes (Unsigned)
HE[facility] [place]                                     Cardiology Office Note:    Date:  07/13/2022   ID:  [name], DOB Jul 09, 1951, MRN [id number]  PCP:  Josetta Huddle, MD  [facility] Cardiologist:  Sinclair Grooms, MD / Dr. Burt Knack & Dr. Cyndia Bent (TAVR) [place] Electrophysiologist:  None   Referring MD: Josetta Huddle, MD   1 year s/p TAVR  History of Present Illness:    Deanna Simmons is a 71 y.o. female with a hx of  CAD s/p remote CABG x1V in 2004 by Dr. Cyndia Bent with a SVG to PDA, HTN, asthma, type 2 diabetes, SLE, prior stroke with right retinal artery occlusion, TIA in 03/2021 and severe AS s/p TAVR (07/13/21) who presents to clinic for follow up.   She has a history of aortic stenosis followed by Dr. Tamala Julian. Echocardiogram on 05/05/21 showed EF 55 to 60%, severe aortic stenosis with a mean gradient of 38 mmHg, and mild to moderate aortic regurgitation as well as mild mitral stenosis/mitral regurgitation. L/RHC on 06/11/21 showed native vessel CAD with a total occlusion of her RCA, patent LAD, patent circumflex, patent 72 year old SVG to distal RCA with an ostial proximal 50 to 70% narrowing.  Medical therapy was recommended.   She was evaluated by the multidisciplinary valve team and underwent successful TAVR with a 23 mm Edwards Sapien 3 Ultra THV via the TF approach on 07/13/21. Post operative echo showed EF 65%, normally functioning TAVR with a mean gradient of 20 mmHg and no PVL. She was continued on Asprin and started on Plaivx 72m daily. She has done well in follow up. 1 month echo showed EF 60%, normally functioning TAVR with a mean gradient of 17 mm hg and no PVL. Later Dr. STamala Julianstopped her aspirin and continued her on monotherapy with Plavix.   1 year echo showed EF 60%, mild asymmetric LVH, normally functioning TAVR with a mean gradient of 14 mmHg and no PVL. Severe MAC with mild MR.   Today the patient presents to  clinic for follow up. No CP or SOB. No LE edema, orthopnea or PND. No dizziness or syncope. No blood in stool or urine. No palpitations. She does have occasional fatigue. She does aerobics at the gym at least 3x a week. She does need a knee replacement but will have to wait until she has more support at home to help take care of her husband.   Past Medical History:  Diagnosis Date   Abnormal finding on Pap smear, ASCUS    Anemia    Asthma    Chondromalacia of knee    Coronary artery disease    Diabetes mellitus without complication ([place]    Diverticulum 12/11/2002   in cecum   Fibroids    h/o   GERD (gastroesophageal reflux disease)    H/O tinea cruris 02/28/2010   History of CVA (cerebrovascular accident)    Hypercholesteremia    Hypertension    Obesity    Pneumonia 07/01/2008   S/P TAVR (transcatheter aortic valve replacement) 07/13/2021   s/p TAVR w/ a 277mEdwards S3U via the TF approach by Dr. CoBurt Knacknd Dr. BaCyndia Bent  Synovitis of knee    [place]ic lupus [place]matosus ([place]   with nephritis  Vulvitis    h/o    Past Surgical History:  Procedure Laterality Date   CARDIAC CATHETERIZATION     CESAREAN SECTION     CORONARY ARTERY BYPASS GRAFT     Endometrial curettage  03/29/2000   INTRAOPERATIVE TRANSTHORACIC ECHOCARDIOGRAM Left 07/13/2021   Procedure: INTRAOPERATIVE TRANSTHORACIC ECHOCARDIOGRAM;  Surgeon: Sherren Mocha, MD;  Location: [place];  Service: Open Heart Surgery;  Laterality: Left;   RIGHT/LEFT HEART CATH AND CORONARY/GRAFT ANGIOGRAPHY N/A 06/11/2021   Procedure: RIGHT/LEFT HEART CATH AND CORONARY/GRAFT ANGIOGRAPHY;  Surgeon: Belva Crome, MD;  Location: [place] [facility];  Service: Cardiovascular;  Laterality: N/A;   ROTATOR CUFF REPAIR     TRANSCATHETER AORTIC VALVE REPLACEMENT, TRANSFEMORAL N/A 07/13/2021   Procedure: TRANSCATHETER AORTIC VALVE REPLACEMENT, TRANSFEMORAL;  Surgeon: Sherren Mocha, MD;  Location: [place];  Service: Open Heart Surgery;   Laterality: N/A;   TUBAL LIGATION     ULTRASOUND GUIDANCE FOR VASCULAR ACCESS Bilateral 07/13/2021   Procedure: ULTRASOUND GUIDANCE FOR VASCULAR ACCESS;  Surgeon: Sherren Mocha, MD;  Location: [place];  Service: Open Heart Surgery;  Laterality: Bilateral;   UPPER GASTROINTESTINAL ENDOSCOPY  12/11/2002    Current Medications: Current Meds  Medication Sig   acetaminophen (TYLENOL) 500 MG tablet Take 1,000 mg by mouth every 6 (six) hours as needed for mild pain.   albuterol (VENTOLIN HFA) 108 (90 Base) MCG/ACT inhaler INHALE 2 PUFFS BY MOUTH EVERY 4-6 HOURS AS NEEDED FOR COUGH OR WHEEZE   azithromycin (ZITHROMAX) 500 MG tablet Take one tablet by mouth1 hour before any dental work including cleanings.   cetirizine (ZYRTEC) 10 MG tablet Take 1 tablet (10 mg total) by mouth daily as needed for allergies (Can use an extra dose during flares).   clopidogrel (PLAVIX) 75 MG tablet Take 1 tablet (75 mg total) by mouth daily.   dapagliflozin propanediol (FARXIGA) 10 MG TABS tablet Take 1 tablet by mouth daily   Estradiol 10 MCG TABS vaginal tablet INSERT 1 TABLET TWICE A WEEK VAGINALLY AS DIRECTED   ezetimibe (ZETIA) 10 MG tablet Take 1 tablet (10 mg total) by mouth daily.   famotidine (PEPCID) 40 MG tablet Take 1 tablet (40 mg total) by mouth daily.   ferrous sulfate 325 (65 FE) MG tablet Take 325 mg by mouth daily.   fluticasone (FLONASE) 50 MCG/ACT nasal spray Place 1 spray into both nostrils daily.   Fluticasone Furoate (ARNUITY ELLIPTA) 200 MCG/ACT AEPB Inhale 1 puff into the lungs daily.   fluticasone-salmeterol (ADVAIR) 250-50 MCG/ACT AEPB Inhale 1 puff into the lungs in the morning and at bedtime.   glucose blood test strip Use to test blood glucose twice daily before meals.   magnesium gluconate (MAGONATE) 500 MG tablet Take 500 mg by mouth daily as needed (cramps).   metoprolol succinate (TOPROL-XL) 25 MG 24 hr tablet TAKE 1 TABLET BY MOUTH TWICE DAILY   Multiple Vitamins-Minerals (MULTIVITAL  PO) Take 1 tablet by mouth daily.   Probiotic Product (PROBIOTIC DAILY PO) Take 1 capsule by mouth at bedtime. Ultra Flora IB   rosuvastatin (CRESTOR) 40 MG tablet Take 1 tablet (40 mg total) by mouth daily.   Tiotropium Bromide Monohydrate (SPIRIVA RESPIMAT) 1.25 MCG/ACT AERS Inhale 2 puffs into the lungs daily.   valACYclovir (VALTREX) 500 MG tablet TAKE 1 TABLET BY MOUTH TWICE DAILY AS NEEDED   vitamin C (ASCORBIC ACID) 500 MG tablet Take 500 mg by mouth daily.   Vitamin D-Vitamin K (VITAMIN K2-VITAMIN D3 PO) Take 1 tablet by mouth  every other day.   zinc gluconate 50 MG tablet Take 50 mg by mouth every evening.     Allergies:   Biaxin [clarithromycin], Cardiolite [technetium-11m, Ciprofloxacin, Erythromycin base, Montelukast sodium, Niaspan [niacin er], Penicillins, Prilosec [omeprazole], Singulair [montelukast], Ticlid [ticlopidine hcl], and Iodinated contrast media   Social History   Socioeconomic History   Marital status: Married    Spouse name: BAlvester Chou  Number of children: 4   Years of education: 12+   Highest education level: Not on file  Occupational History   Occupation: CARE MANAGEMENT [employer]    Employer: [employer] Tobacco Use   Smoking status: Former   Smokeless tobacco: Never  Substance and Sexual Activity   Alcohol use: Yes    Comment: occasional   Drug use: No   Sexual activity: Not on file  Other Topics Concern   Not on file  Social History Narrative   Lives with her husband. [place] children are grown, 1 [place] and [place]   Right handed   Social Determinants of Health   Financial Resource Strain: Not on file  Food Insecurity: Not on file  Transportation Needs: Not on file  Physical Activity: Not on file  Stress: Not on file  Social Connections: Not on file     Family History: The patient's family history includes Breast cancer (age of onset: 763 in her mother; Diabetes in her  father and mother; Heart attack in her paternal uncle; Hyperlipidemia in her father; Ovarian cancer in her cousin; Prostate cancer in her father; Stroke in her mother.  ROS:   Please see the history of present illness.    All other systems reviewed and are negative.  EKGs/Labs/Other Studies Reviewed:    The following studies were reviewed today:  TAVR OPERATIVE NOTE     Date of Procedure:                07/13/2021   Preoperative Diagnosis:      Severe Aortic Stenosis    Postoperative Diagnosis:    Same    Procedure:        Transcatheter Aortic Valve Replacement - Percutaneous Right Transfemoral Approach             Edwards Sapien 3 Ultra THV (size 23 mm, model # 9750TFX, serial # [id number]              Co-Surgeons:                        BGaye Pollack MD and MSherren Mocha MD      Anesthesiologist:                  KSuella Broad MD   Echocardiographer:              MSanda Klein MD   Pre-operative Echo Findings: Severe aortic stenosis Normal left ventricular systolic function   Post-operative Echo Findings: No paravalvular leak Normal left ventricular systolic function _____________     Echo 07/14/21: IMPRESSIONS  1. Left ventricular ejection fraction, by estimation, is 65 to 70%. The  left ventricle has normal function. The left ventricle has no regional  wall motion abnormalities. There is mild concentric left ventricular  hypertrophy. Left ventricular diastolic  parameters are consistent with Grade I diastolic dysfunction (impaired  relaxation). Elevated left atrial pressure.   2. Right ventricular systolic function is normal. The right  ventricular  size is normal. Tricuspid regurgitation signal is inadequate for assessing  PA pressure.   3. Left atrial size was moderately dilated.   4. The mitral valve is normal in structure. Mild mitral valve  regurgitation. The mean mitral valve gradient is 3.0 mmHg with average  heart rate of 66 bpm. Moderate to severe  mitral annular calcification.   5. The aortic valve has been repaired/replaced. Aortic valve  regurgitation is not visualized. There is a 23 mm Edwards Ultra, stented  (TAVR) valve present in the aortic position. Procedure Date: 07/13/2021.  Aortic valve area, by VTI measures 1.45 cm.  Aortic valve mean gradient measures 20.0 mmHg. Aortic valve Vmax measures  2.90 m/s. Aortic valve acceleration time measures 81 msec.   6. The inferior vena cava is normal in size with greater than 50%  respiratory variability, suggesting right atrial pressure of 3 mmHg.  _______________________  Echo 08/05/21 IMPRESSIONS  1. Left ventricular ejection fraction, by estimation, is 60 to 65%. The  left ventricle has normal function. The left ventricle has no regional  wall motion abnormalities. There is mild concentric left ventricular  hypertrophy. Left ventricular diastolic  parameters are consistent with Grade I diastolic dysfunction (impaired  relaxation).   2. Right ventricular systolic function is normal. The right ventricular  size is normal. There is normal pulmonary artery systolic pressure.   3. The mitral valve is degenerative. Mild mitral valve regurgitation. No  evidence of mitral stenosis. The mean mitral valve gradient is 3.0 mmHg.  Severe mitral annular calcification.   4. The aortic valve has been repaired/replaced. Aortic valve  regurgitation is not visualized. No aortic stenosis is present. There is a  23 mm Sapien prosthetic (TAVR) valve present in the aortic position.  Procedure Date: 07/13/2021. Aortic valve mean  gradient measures 17.0 mmHg. DVI 0.32.   5. The inferior vena cava is normal in size with greater than 50%  respiratory variability, suggesting right atrial pressure of 3 mmHg.   6. Compared to prior echo, mean TAVR gradient has decreased from 20 to  83mHg.   _________________________  Echo 07/08/22 IMPRESSIONS   1. Left ventricular ejection fraction, by estimation, is  60 to 65%. The  left ventricle has normal function. The left ventricle has no regional  wall motion abnormalities. There is mild asymmetric left ventricular  hypertrophy of the basal-septal segment.  Left ventricular diastolic parameters are consistent with Grade I  diastolic dysfunction (impaired relaxation). Elevated left ventricular  end-diastolic pressure.   2. Right ventricular systolic function is normal. The right ventricular  size is normal.   3. Left atrial size was mildly dilated.   4. The mitral valve is normal in structure. Mild mitral valve  regurgitation. No evidence of mitral stenosis. Severe mitral annular  calcification.   5. TAVR gradients are stable. The aortic valve has been  repaired/replaced. Aortic valve regurgitation is not visualized. No aortic  stenosis is present. Echo findings are consistent with normal structure  and function of the aortic valve prosthesis.  Aortic valve mean gradient measures 14.0 mmHg. Aortic valve Vmax measures  2.56 m/s.   6. The inferior vena cava is normal in size with greater than 50%  respiratory variability, suggesting right atrial pressure of 3 mmHg.   EKG:  EKG is ordered today.This shows sinus, HR 80  Recent Labs: 07/14/2021: BUN 12; Creatinine, Ser 0.66; Hemoglobin 10.4; Platelets 216; Potassium 3.9; Sodium 140  Recent Lipid Panel    Component Value Date/Time  CHOL 120 03/11/2014 0841   TRIG 65.0 03/11/2014 0841   HDL 41.00 03/11/2014 0841   CHOLHDL 3 03/11/2014 0841   VLDL 13.0 03/11/2014 0841   LDLCALC 66 03/11/2014 0841     Risk Assessment/Calculations:       Physical Exam:    VS:  BP 100/64   Pulse 80   Ht 5' 4.5" (1.638 m)   Wt 220 lb 9.6 oz (100.1 kg)   SpO2 97%   BMI 37.28 kg/m     Wt Readings from Last 3 Encounters:  07/13/22 220 lb 9.6 oz (100.1 kg)  05/24/22 223 lb (101.2 kg)  03/01/22 226 lb 3.2 oz (102.6 kg)     GEN:  Well nourished, well developed in no acute distress HEENT:  Normal NECK: No JVD LYMPHATICS: No lymphadenopathy CARDIAC: RRR, soft flow murmur. No rubs, gallops RESPIRATORY:  Clear to auscultation without rales, wheezing or rhonchi  ABDOMEN: Soft, non-tender, non-distended MUSCULOSKELETAL:  No edema; No deformity  SKIN: Warm and dry.   NEUROLOGIC:  Alert and oriented x 3 PSYCHIATRIC:  Normal affect   ASSESSMENT:    1. S/P TAVR (transcatheter aortic valve replacement)   2. Coronary artery disease of bypass graft of native heart with stable angina pectoris ([place])   3. History of CVA (cerebrovascular accident)   4. Osteoarthritis, unspecified osteoarthritis type, unspecified site     PLAN:    In order of problems listed above:  Severe AS s/p TAVR: echo 07/08/22 showed EF 60%, mild asymmetric LVH, normally functioning TAVR with a mean gradient of 14 mmHg and no PVL as well as severe MAC with mild MR. She has NYHA class I symptoms. SBE prophylaxis discussed; she has azithromycin due to a PCN allergy. Continue on monotherapy with Plavix.  She will continue regular follow up with Dr. Tamala Julian.   CAD s/p CABG: pre TAVR L/RHC on 06/11/21 showed native vessel CAD with a total occlusion of her RCA, patent LAD, patent circumflex, patent 72 year old SVG to distal RCA with an ostial proximal 50 to 70% narrowing. No chest pain. Continue medical therapy.    Hx of CVA: continue plavix and statin .  OA: had knee surgery scheduled but cancelled it due to not having enough support for her husband at home.    Medication Adjustments/Labs and Tests Ordered: Current medicines are reviewed at length with the patient today.  Concerns regarding medicines are outlined above.  Orders Placed This Encounter  Procedures   EKG 12-Lead     No orders of the defined types were placed in this encounter.    Patient Instructions  Medication Instructions:  Your physician recommends that you continue on your current medications as directed. Please refer to the Current  Medication list given to you today.  *If you need a refill on your cardiac medications before your next appointment, please call your pharmacy*   Lab Work: None ordered   If you have labs (blood work) drawn today and your tests are completely normal, you will receive your results only by: [place] (if you have MyChart) OR A paper copy in the mail If you have any lab test that is abnormal or we need to change your treatment, we will call you to review the results.   Testing/Procedures: None ordered    Follow-Up: Follow up as scheduled   Other Instructions   Important Information About Sugar         Signed, Angelena Form, PA-C  07/13/2022 2:24 PM    [facility]

## 2022-07-13 ENCOUNTER — Other Ambulatory Visit (HOSPITAL_COMMUNITY): Payer: 59

## 2022-07-13 ENCOUNTER — Ambulatory Visit: Payer: 59 | Attending: Physician Assistant | Admitting: Physician Assistant

## 2022-07-13 VITALS — BP 100/64 | HR 80 | Ht 64.5 in | Wt 220.6 lb

## 2022-07-13 DIAGNOSIS — Z8673 Personal history of transient ischemic attack (TIA), and cerebral infarction without residual deficits: Secondary | ICD-10-CM | POA: Diagnosis not present

## 2022-07-13 DIAGNOSIS — Z952 Presence of prosthetic heart valve: Secondary | ICD-10-CM

## 2022-07-13 DIAGNOSIS — M1990 Unspecified osteoarthritis, unspecified site: Secondary | ICD-10-CM | POA: Diagnosis not present

## 2022-07-13 DIAGNOSIS — I25708 Atherosclerosis of coronary artery bypass graft(s), unspecified, with other forms of angina pectoris: Secondary | ICD-10-CM

## 2022-07-13 NOTE — Patient Instructions (Signed)

## 2022-07-18 ENCOUNTER — Ambulatory Visit: Admit: 2022-07-18 | Payer: 59 | Admitting: Orthopedic Surgery

## 2022-07-18 SURGERY — ARTHROPLASTY, KNEE, TOTAL
Anesthesia: Choice | Site: Knee | Laterality: Left

## 2022-08-01 ENCOUNTER — Other Ambulatory Visit (HOSPITAL_COMMUNITY): Payer: Self-pay

## 2022-08-04 ENCOUNTER — Other Ambulatory Visit: Payer: Self-pay | Admitting: Obstetrics & Gynecology

## 2022-08-04 DIAGNOSIS — Z1231 Encounter for screening mammogram for malignant neoplasm of breast: Secondary | ICD-10-CM

## 2022-08-10 ENCOUNTER — Other Ambulatory Visit (HOSPITAL_COMMUNITY): Payer: Self-pay

## 2022-08-16 ENCOUNTER — Other Ambulatory Visit (HOSPITAL_COMMUNITY): Payer: Self-pay

## 2022-08-24 DIAGNOSIS — I872 Venous insufficiency (chronic) (peripheral): Secondary | ICD-10-CM | POA: Diagnosis not present

## 2022-08-29 DIAGNOSIS — Z09 Encounter for follow-up examination after completed treatment for conditions other than malignant neoplasm: Secondary | ICD-10-CM | POA: Diagnosis not present

## 2022-08-29 DIAGNOSIS — I83892 Varicose veins of left lower extremities with other complications: Secondary | ICD-10-CM | POA: Diagnosis not present

## 2022-09-06 ENCOUNTER — Encounter: Payer: Self-pay | Admitting: Allergy and Immunology

## 2022-09-06 ENCOUNTER — Other Ambulatory Visit (HOSPITAL_COMMUNITY): Payer: Self-pay

## 2022-09-06 ENCOUNTER — Ambulatory Visit: Payer: 59 | Admitting: Allergy and Immunology

## 2022-09-06 VITALS — BP 100/72 | HR 88 | Temp 98.7°F | Resp 18 | Ht 65.0 in | Wt 217.4 lb

## 2022-09-06 DIAGNOSIS — J302 Other seasonal allergic rhinitis: Secondary | ICD-10-CM

## 2022-09-06 DIAGNOSIS — K219 Gastro-esophageal reflux disease without esophagitis: Secondary | ICD-10-CM | POA: Diagnosis not present

## 2022-09-06 DIAGNOSIS — J4540 Moderate persistent asthma, uncomplicated: Secondary | ICD-10-CM

## 2022-09-06 DIAGNOSIS — J3089 Other allergic rhinitis: Secondary | ICD-10-CM | POA: Diagnosis not present

## 2022-09-06 MED ORDER — FAMOTIDINE 40 MG PO TABS
40.0000 mg | ORAL_TABLET | Freq: Every day | ORAL | 1 refills | Status: DC
  Filled 2022-09-06 – 2022-11-04 (×3): qty 90, 90d supply, fill #0

## 2022-09-06 MED ORDER — FLUTICASONE-SALMETEROL 250-50 MCG/ACT IN AEPB
1.0000 | INHALATION_SPRAY | Freq: Two times a day (BID) | RESPIRATORY_TRACT | 5 refills | Status: DC
  Filled 2022-09-06 – 2022-10-06 (×2): qty 60, 30d supply, fill #0

## 2022-09-06 MED ORDER — ARNUITY ELLIPTA 200 MCG/ACT IN AEPB
1.0000 | INHALATION_SPRAY | Freq: Every day | RESPIRATORY_TRACT | 5 refills | Status: DC
  Filled 2022-09-06: qty 60, 30d supply, fill #0
  Filled 2022-10-06: qty 30, 30d supply, fill #0

## 2022-09-06 MED ORDER — SPIRIVA RESPIMAT 1.25 MCG/ACT IN AERS
2.0000 | INHALATION_SPRAY | Freq: Every day | RESPIRATORY_TRACT | 5 refills | Status: DC | PRN
  Filled 2022-09-06 – 2022-12-27 (×2): qty 4, 30d supply, fill #0

## 2022-09-06 MED ORDER — FLUTICASONE PROPIONATE 50 MCG/ACT NA SUSP
1.0000 | Freq: Every day | NASAL | 1 refills | Status: DC
  Filled 2022-09-06: qty 16, 60d supply, fill #0

## 2022-09-06 NOTE — Progress Notes (Signed)
[place] - [place] - [place] [facility] [place] [facility] [place]   Follow-up Note  Referring Provider: Marden Noble, MD Primary Provider: Marden Noble, MD Date of Office Visit: 09/06/2022  Subjective:   Deanna Simmons (DOB: 01-01-1951) is a 71 y.o. female who [facility] in re-evaluation of the following:  HPI: [name] presents this clinic in evaluation of asthma, allergic rhinitis, LPR. Her last visit to this clinic was 24 May 2022.  During her last visit we introduced the use of Spiriva in addition to her Advair use and it really did make a difference regarding some of her lingering wheezing and coughing issues.  She is now at a point where she is only using this medication intermittently.  Rarely does she use a short acting bronchodilator.  She has not required a systemic steroid to treat an exacerbation of asthma.  She has some limitation of ability to exercise because of her left knee.  Her reflux is under very good control with famotidine.  She has had very little issue with her upper airways.  She did receive this years flu vaccine.  Allergies as of 09/06/2022       Reactions   Biaxin [clarithromycin] Itching   Cardiolite [technetium-21m] Itching   Ciprofloxacin Itching   Erythromycin Base Itching   Montelukast Sodium Itching   Niaspan [niacin Er] Other (See Comments)   flushing   Penicillins Itching   Reaction: over 10 years   Prilosec [omeprazole] Itching   Singulair [montelukast] Hives, Itching   Ticlid [ticlopidine Hcl] Itching   Iodinated Contrast Media Itching   Pt has a potential contrast allergy; She states in 2012 her cardiologist told her she had a reaction during the "study". We are unable to determine the study or confirm a reaction. The patient will have a 13 hr prep for future studies. Molecular Imaging states their injection does not cause any reactions.        Medication List    acetaminophen 500 MG  tablet Commonly known as: TYLENOL Take 1,000 mg by mouth every 6 (six) hours as needed for mild pain.   Advair Diskus 250-50 MCG/ACT Aepb Generic drug: fluticasone-salmeterol Inhale 1 puff into the lungs in the morning and at bedtime.   albuterol 108 (90 Base) MCG/ACT inhaler Commonly known as: Ventolin HFA INHALE 2 PUFFS BY MOUTH EVERY 4-6 HOURS AS NEEDED FOR COUGH OR WHEEZE   Arnuity Ellipta 200 MCG/ACT Aepb Generic drug: Fluticasone Furoate Inhale 1 puff into the lungs daily.   ascorbic acid 500 MG tablet Commonly known as: VITAMIN C Take 500 mg by mouth daily.   azithromycin 500 MG tablet Commonly known as: Zithromax Take one tablet by mouth1 hour before any dental work including cleanings.   cetirizine 10 MG tablet Commonly known as: ZYRTEC Take 1 tablet (10 mg total) by mouth daily as needed for allergies (Can use an extra dose during flares).   clopidogrel 75 MG tablet Commonly known as: PLAVIX Take 1 tablet (75 mg total) by mouth daily.   Estradiol 10 MCG Tabs vaginal tablet INSERT 1 TABLET TWICE A WEEK VAGINALLY AS DIRECTED   ezetimibe 10 MG tablet Commonly known as: ZETIA Take 1 tablet (10 mg total) by mouth daily.   famotidine 40 MG tablet Commonly known as: PEPCID Take 1 tablet (40 mg total) by mouth daily.   Farxiga 10 MG Tabs tablet Generic drug: dapagliflozin propanediol Take 1 tablet by mouth daily   ferrous sulfate 325 (  65 FE) MG tablet Take 325 mg by mouth daily.   fluticasone 50 MCG/ACT nasal spray Commonly known as: FLONASE Place 1 spray into both nostrils daily.   glucose blood test strip Use to test blood glucose twice daily before meals.   magnesium gluconate 500 MG tablet Commonly known as: MAGONATE Take 500 mg by mouth daily as needed (cramps).   metoprolol succinate 25 MG 24 hr tablet Commonly known as: TOPROL-XL TAKE 1 TABLET BY MOUTH TWICE DAILY   MULTIVITAL PO Take 1 tablet by mouth daily.   nitroGLYCERIN 0.4 MG SL  tablet Commonly known as: NITROSTAT Place 1 tablet (0.4 mg total) under the tongue every 5 (five) minutes as needed for up to 25 days for chest pain.   PROBIOTIC DAILY PO Take 1 capsule by mouth at bedtime. Ultra Flora IB   rosuvastatin 40 MG tablet Commonly known as: CRESTOR Take 1 tablet (40 mg total) by mouth daily.   Spiriva Respimat 1.25 MCG/ACT Aers Generic drug: Tiotropium Bromide Monohydrate Inhale 2 puffs into the lungs daily.   valACYclovir 500 MG tablet Commonly known as: VALTREX TAKE 1 TABLET BY MOUTH TWICE DAILY AS NEEDED   VITAMIN K2-VITAMIN D3 PO Take 1 tablet by mouth every other day.   zinc gluconate 50 MG tablet Take 50 mg by mouth every evening.    Past Medical History:  Diagnosis Date   Abnormal finding on Pap smear, ASCUS    Anemia    Asthma    Chondromalacia of knee    Coronary artery disease    Diabetes mellitus without complication (HCC)    Diverticulum 12/11/2002   in cecum   Fibroids    h/o   GERD (gastroesophageal reflux disease)    H/O tinea cruris 02/28/2010   History of CVA (cerebrovascular accident)    Hypercholesteremia    Hypertension    Obesity    Pneumonia 07/01/2008   S/P TAVR (transcatheter aortic valve replacement) 07/13/2021   s/p TAVR w/ a 23mm Edwards S3U via the TF approach by Dr. Excell Seltzer and Dr. Laneta Simmers.   Synovitis of knee    Systemic lupus erythematosus (HCC)    with nephritis   Vulvitis    h/o    Past Surgical History:  Procedure Laterality Date   CARDIAC CATHETERIZATION     CESAREAN SECTION     CORONARY ARTERY BYPASS GRAFT     Endometrial curettage  03/29/2000   INTRAOPERATIVE TRANSTHORACIC ECHOCARDIOGRAM Left 07/13/2021   Procedure: INTRAOPERATIVE TRANSTHORACIC ECHOCARDIOGRAM;  Surgeon: Tonny Bollman, MD;  Location: [facility] OR;  Service: Open Heart Surgery;  Laterality: Left;   RIGHT/LEFT HEART CATH AND CORONARY/GRAFT ANGIOGRAPHY N/A 06/11/2021   Procedure: RIGHT/LEFT HEART CATH AND CORONARY/GRAFT ANGIOGRAPHY;   Surgeon: Lyn Records, MD;  Location: [facility] INVASIVE CV LAB;  Service: Cardiovascular;  Laterality: N/A;   ROTATOR CUFF REPAIR     TRANSCATHETER AORTIC VALVE REPLACEMENT, TRANSFEMORAL N/A 07/13/2021   Procedure: TRANSCATHETER AORTIC VALVE REPLACEMENT, TRANSFEMORAL;  Surgeon: Tonny Bollman, MD;  Location: [facility] OR;  Service: Open Heart Surgery;  Laterality: N/A;   TUBAL LIGATION     ULTRASOUND GUIDANCE FOR VASCULAR ACCESS Bilateral 07/13/2021   Procedure: ULTRASOUND GUIDANCE FOR VASCULAR ACCESS;  Surgeon: Tonny Bollman, MD;  Location: [facility] OR;  Service: Open Heart Surgery;  Laterality: Bilateral;   UPPER GASTROINTESTINAL ENDOSCOPY  12/11/2002    Review of systems negative except as noted in HPI / PMHx or noted below:  Review of Systems  Constitutional: Negative.   HENT: Negative.  Eyes: Negative.   Respiratory: Negative.    Cardiovascular: Negative.   Gastrointestinal: Negative.   Genitourinary: Negative.   Musculoskeletal: Negative.   Skin: Negative.   Neurological: Negative.   Endo/Heme/Allergies: Negative.   Psychiatric/Behavioral: Negative.       Objective:   Vitals:   09/06/22 1141  BP: 100/72  Pulse: 88  Resp: 18  Temp: 98.7 F (37.1 C)  SpO2: 98%   Height: 5\' 5"  (165.1 cm)  Weight: 217 lb 6.4 oz (98.6 kg)   Physical Exam Constitutional:      Appearance: She is not diaphoretic.  HENT:     Head: Normocephalic.     Right Ear: Tympanic membrane, ear canal and external ear normal.     Left Ear: Tympanic membrane, ear canal and external ear normal.     Nose: Nose normal. No mucosal edema or rhinorrhea.     Mouth/Throat:     Pharynx: Uvula midline. No oropharyngeal exudate.  Eyes:     Conjunctiva/sclera: Conjunctivae normal.  Neck:     Thyroid: No thyromegaly.     Trachea: Trachea normal. No tracheal tenderness or tracheal deviation.  Cardiovascular:     Rate and Rhythm: Normal rate and regular rhythm.     Heart sounds: Normal heart sounds, S1 normal and S2  normal. No murmur heard. Pulmonary:     Effort: No respiratory distress.     Breath sounds: Normal breath sounds. No stridor. No wheezing or rales.  Lymphadenopathy:     Head:     Right side of head: No tonsillar adenopathy.     Left side of head: No tonsillar adenopathy.     Cervical: No cervical adenopathy.  Skin:    Findings: No erythema or rash.     Nails: There is no clubbing.  Neurological:     Mental Status: She is alert.     Diagnostics:    Spirometry was performed and demonstrated an FEV1 of 1.79 at 87 % of predicted.  Assessment and Plan:   1. Asthma, moderate persistent, well-controlled   2. Seasonal and perennial allergic rhinitis   3. LPRD (laryngopharyngeal reflux disease)     1.  Continue Advair 250 1 inhalation 1-2 times per day depending on disease activity  2.  Continue Spiriva 1.25 Respimat - 2 inhalations 1 time per day IF NEEDED  2.  Continue Flonase 1 spray each nostril 3-7 times per week depending on disease activity  3. Continue Famotidine 40 mg tablet 1 time per day  4.  "Action plan" for asthma flare:   A. Add Arnuity 200 - 1 inhalation 2 times per day  B. Use Albuterol HFA -2 puffs every 4-6 hours if needed  C. Increase famotidine to 40 mg 2 times per day  5. Zyrtec 10 mg one tablet one time per day if needed  6. Return to clinic in 6 months or earlier if problem  7. Obtain RSV vaccine  Venisa appears to be doing okay.  She has a very good understanding of her disease state and how her medications work and appropriate dosing of her Advair and Spiriva depending on disease activity.  Likewise, she has a very good idea about how much Flonase to use and famotidine to treat her upper airway issue and reflux respectively.  I will see her back in this clinic in 6 months or earlier should this plan be ineffective in preventing her from developing significant problems with her airway or reflux.    Laurette Schimke, MD  Allergy / Immunology [place]  [facility]

## 2022-09-06 NOTE — Patient Instructions (Signed)
  1.  Continue Advair 250 1 inhalation 1-2 times per day depending on disease activity  2.  Continue Spiriva 1.25 Respimat - 2 inhalations 1 time per day IF NEEDED  2.  Continue Flonase 1 spray each nostril 3-7 times per week depending on disease activity  3. Continue Famotidine 40 mg tablet 1 time per day  4.  "Action plan" for asthma flare:   A. Add Arnuity 200 - 1 inhalation 2 times per day  B. Use Albuterol HFA -2 puffs every 4-6 hours if needed  C. Increase famotidine to 40 mg 2 times per day  5. Zyrtec 10 mg one tablet one time per day if needed  6. Return to clinic in 6 months or earlier if problem  7. RSV vaccine

## 2022-09-07 ENCOUNTER — Other Ambulatory Visit (HOSPITAL_COMMUNITY): Payer: Self-pay

## 2022-09-07 ENCOUNTER — Encounter: Payer: Self-pay | Admitting: Allergy and Immunology

## 2022-09-12 DIAGNOSIS — I83892 Varicose veins of left lower extremities with other complications: Secondary | ICD-10-CM | POA: Diagnosis not present

## 2022-09-15 IMAGING — MG MM DIGITAL SCREENING BILAT W/ TOMO AND CAD
8 series · 8 of 24 positions shown · non-contrast
Comparison: Previous exam(s).

CLINICAL DATA: Screening.

EXAM:
DIGITAL SCREENING BILATERAL MAMMOGRAM WITH TOMOSYNTHESIS AND CAD
TECHNIQUE: Bilateral screening digital craniocaudal and mediolateral oblique
mammograms were obtained. Bilateral screening digital breast
tomosynthesis was performed. The images were evaluated with
computer-aided detection.

[R CC synth-2D]
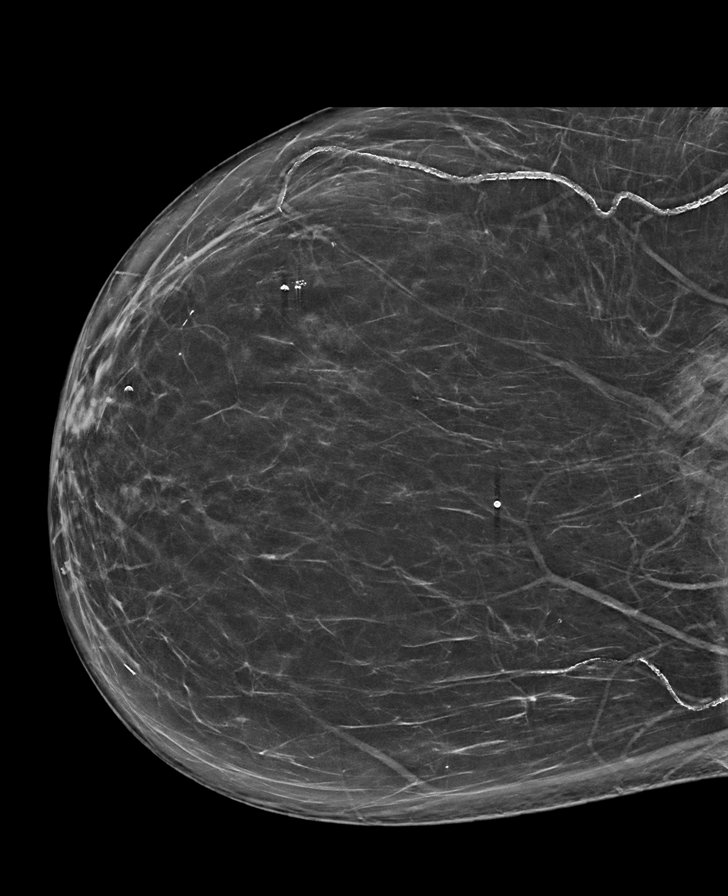

[R MLO synth-2D]
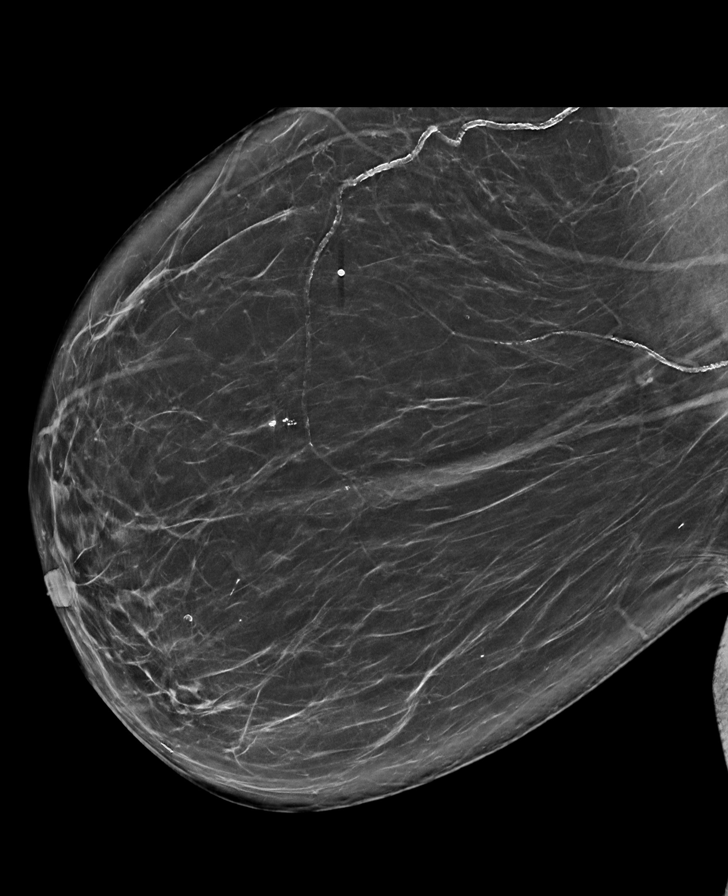

[L MLO synth-2D]
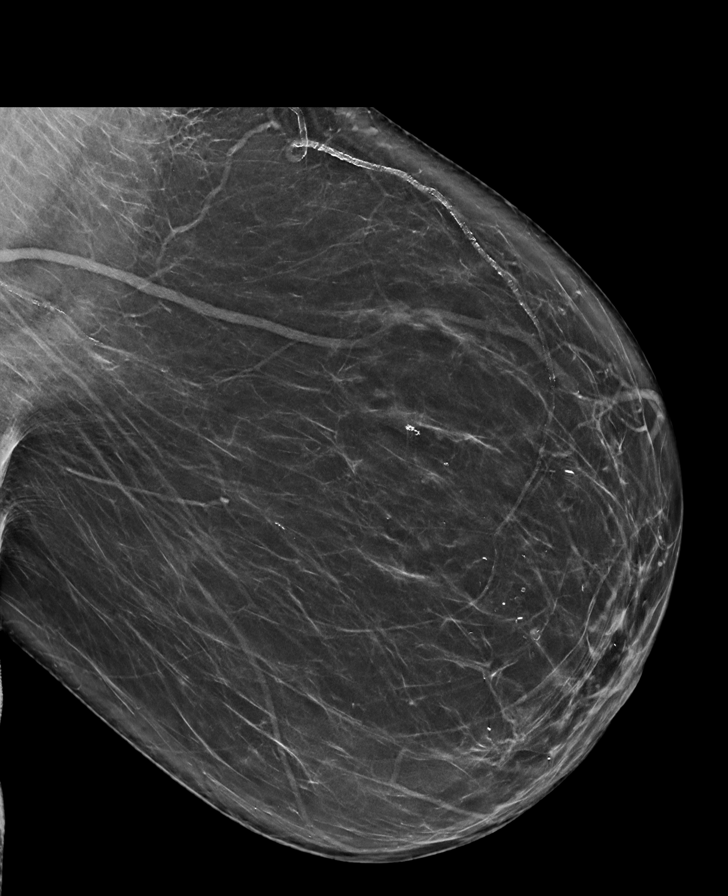

[L CC synth-2D]
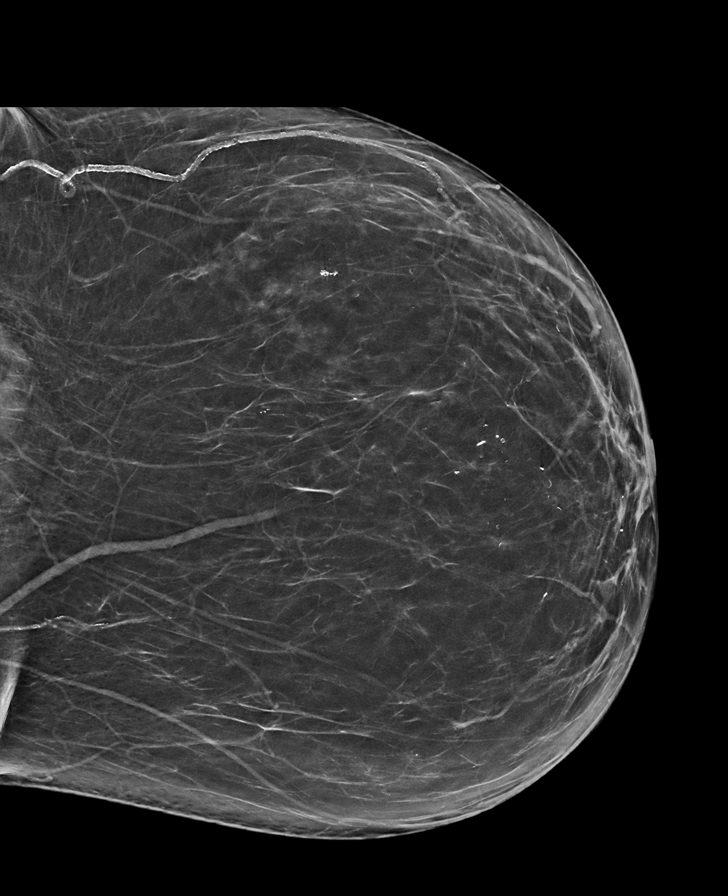

[R CC tomo · tomo slice 41/82.0]
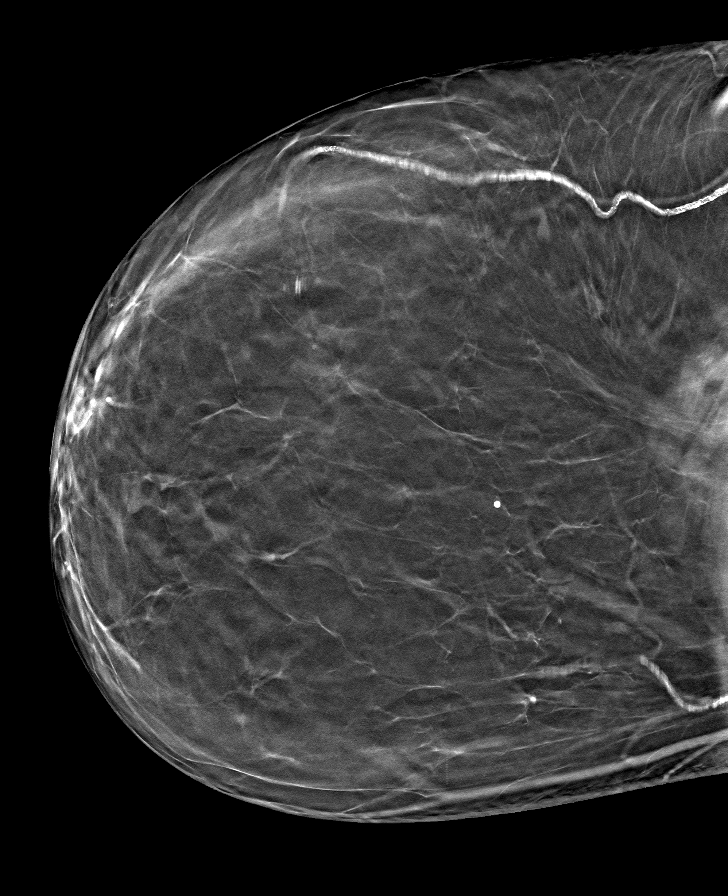

[R MLO tomo · tomo slice 47/92.0]
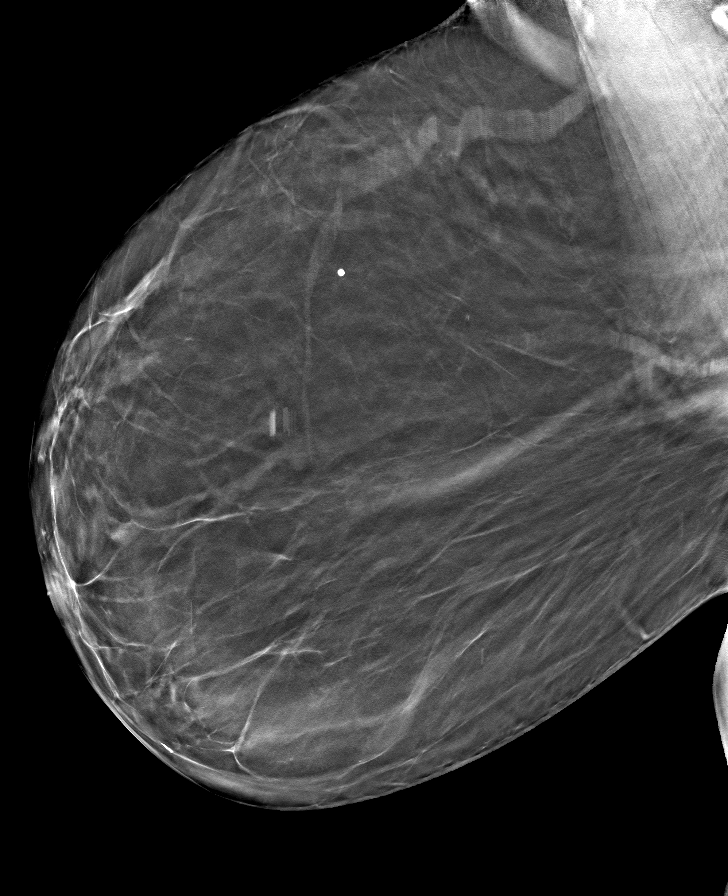

[L MLO tomo · tomo slice 43/86.0]
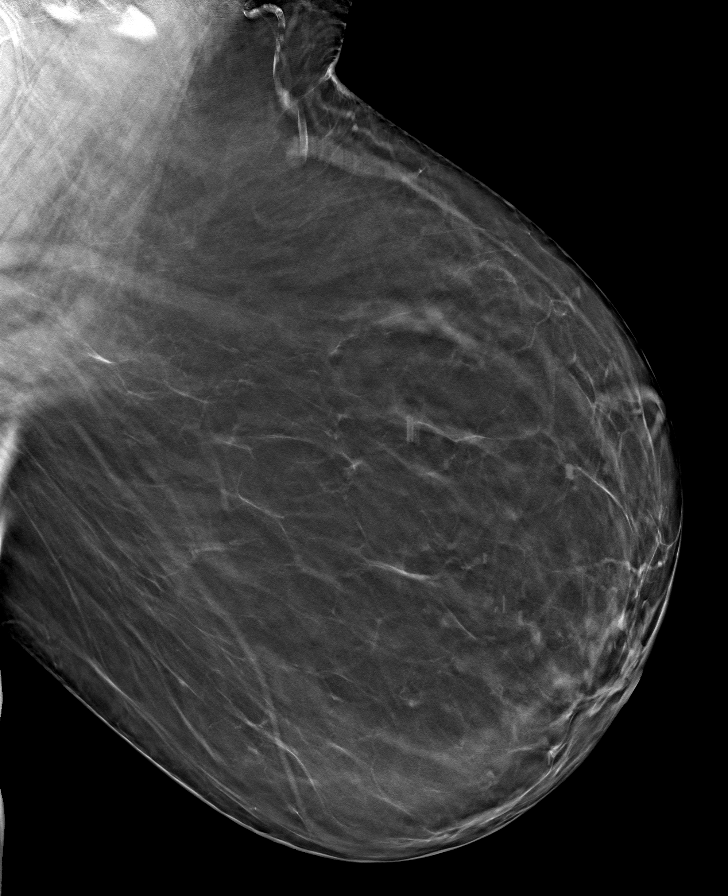

[L CC tomo · tomo slice 41/80.0]
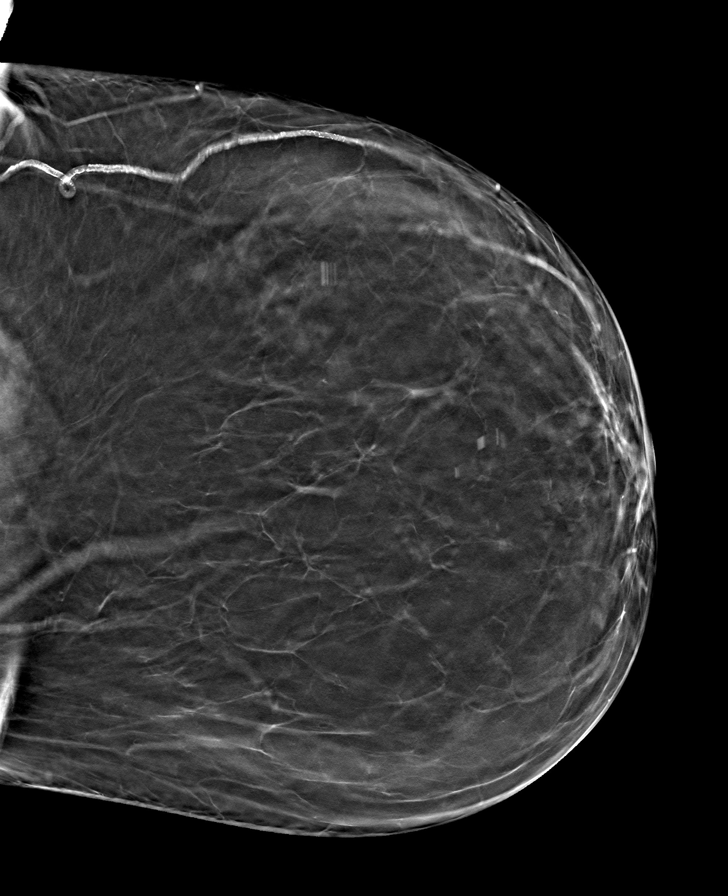

[8 of 24 positions shown; findings below may reference images not displayed]

ACR Breast Density Category b: There are scattered areas of
fibroglandular density.
FINDINGS: There are no findings suspicious for malignancy.
IMPRESSION: No mammographic evidence of malignancy. A result letter of this
screening mammogram will be mailed directly to the patient.

RECOMMENDATION:
Screening mammogram in one year. (Code:51-O-LD2)

BI-RADS CATEGORY  1: Negative.

## 2022-09-16 ENCOUNTER — Other Ambulatory Visit (HOSPITAL_COMMUNITY): Payer: Self-pay

## 2022-09-23 ENCOUNTER — Ambulatory Visit: Payer: Self-pay | Admitting: *Deleted

## 2022-09-23 NOTE — Telephone Encounter (Signed)
Reason for Disposition  Injury (or injuries) that need emergency care    Right hand and middle finger swollen.   Cut on right side of face/forehead  Answer Assessment - Initial Assessment Questions 1. MECHANISM: "How did the fall happen?"     I tripped over the curb going into the store.   I fell on my face and my glasses cut my head a little bit.   I tried to catch myself.   My finger and hand is swollen.  Right hand and one middle finger is swollen.    I'm putting ice on it.   This morning the whole hand is swollen.     My head is sore and the scar is there.   It's not hurting.   Cut me on side of my face where my glasses sit right side.   My glasses broke too.   I was wondering if it was ok to come to the [place] location?   I let her know she could go there to the ED at [facility] in [place].   2. DOMESTIC VIOLENCE AND ELDER ABUSE SCREENING: "Did you fall because someone pushed you or tried to hurt you?" If Yes, ask: "Are you safe now?"     No 3. ONSET: "When did the fall happen?" (e.g., minutes, hours, or days ago)     Wed. Night when I was getting out of the car.  I tripped on the curb and fell. 4. LOCATION: "What part of the body hit the ground?" (e.g., back, buttocks, head, hips, knees, hands, head, stomach)     My right hand is swollen where I reached out to catch myself.   I fell and hit my glasses which broke and cut me on the right side of my face.   It's not a bad injury.   It's sore. 5. INJURY: "Did you hurt (injure) yourself when you fell?" If Yes, ask: "What did you injure? Tell me more about this?" (e.g., body area; type of injury; pain severity)"     Yes.   See above 6. PAIN: "Is there any pain?" If Yes, ask: "How bad is the pain?" (e.g., Scale 1-10; or mild,  moderate, severe)   - NONE (0): No pain   - MILD (1-3): Doesn't interfere with normal activities    - MODERATE (4-7): Interferes with normal activities or awakens from sleep    - SEVERE (8-10): Excruciating pain,  unable to do any normal activities      Scar on my face from my glasses breaking is sore but doesn't hurt. 7. SIZE: For cuts, bruises, or swelling, ask: "How large is it?" (e.g., inches or centimeters)      Not asked 8. PREGNANCY: "Is there any chance you are pregnant?" "When was your last menstrual period?"     N/A due to age 71. OTHER SYMPTOMS: "Do you have any other symptoms?" (e.g., dizziness, fever, weakness; new onset or worsening).      Right hand and middle finger are swollen and sore. 10. CAUSE: "What do you think caused the fall (or falling)?" (e.g., tripped, dizzy spell)       I tripped over the curb getting out of my car.  Protocols used: Falls and [facility]

## 2022-09-23 NOTE — Telephone Encounter (Signed)
  Chief Complaint: Right hand and middle finger swollen after tripping over the curb Wed. Night while getting out of the car.   Also hit her face and broke her glasses.  Has a cut on right side of face/forehead from her glasses. Symptoms: Right hand is very swollen this morning and middle finger very swollen more than they have been up to this point. Frequency: Sharrie Rothman. night Pertinent Negatives: Patient denies bleeding or that she passed out during the fall. Disposition: '[x]'$ ED /'[]'$ Urgent Care (no appt availability in office) / '[]'$ Appointment(In office/virtual)/ '[]'$  [place] [facility]/ '[]'$ Home Care/ '[]'$ Refused Recommended Disposition /'[]'$ [facility]/ '[]'$  Follow-up with PCP Additional Notes: She called the community line to see if going to the [place] ED would be a good place to go and have her hand and finger evaluated.   I let her know it was.   She thanked me for my help.

## 2022-09-24 DIAGNOSIS — W19XXXA Unspecified fall, initial encounter: Secondary | ICD-10-CM | POA: Diagnosis not present

## 2022-09-24 DIAGNOSIS — M25562 Pain in left knee: Secondary | ICD-10-CM | POA: Diagnosis not present

## 2022-09-24 DIAGNOSIS — S6991XA Unspecified injury of right wrist, hand and finger(s), initial encounter: Secondary | ICD-10-CM | POA: Diagnosis not present

## 2022-09-24 DIAGNOSIS — Z7189 Other specified counseling: Secondary | ICD-10-CM | POA: Diagnosis not present

## 2022-09-29 DIAGNOSIS — L089 Local infection of the skin and subcutaneous tissue, unspecified: Secondary | ICD-10-CM | POA: Diagnosis not present

## 2022-10-03 DIAGNOSIS — I83892 Varicose veins of left lower extremities with other complications: Secondary | ICD-10-CM | POA: Diagnosis not present

## 2022-10-06 ENCOUNTER — Other Ambulatory Visit (HOSPITAL_COMMUNITY): Payer: Self-pay

## 2022-10-06 DIAGNOSIS — Z6839 Body mass index (BMI) 39.0-39.9, adult: Secondary | ICD-10-CM | POA: Diagnosis not present

## 2022-10-06 DIAGNOSIS — Z01411 Encounter for gynecological examination (general) (routine) with abnormal findings: Secondary | ICD-10-CM | POA: Diagnosis not present

## 2022-10-06 DIAGNOSIS — N952 Postmenopausal atrophic vaginitis: Secondary | ICD-10-CM | POA: Diagnosis not present

## 2022-10-06 DIAGNOSIS — B3731 Acute candidiasis of vulva and vagina: Secondary | ICD-10-CM | POA: Diagnosis not present

## 2022-10-06 DIAGNOSIS — E1169 Type 2 diabetes mellitus with other specified complication: Secondary | ICD-10-CM | POA: Diagnosis not present

## 2022-10-06 DIAGNOSIS — N898 Other specified noninflammatory disorders of vagina: Secondary | ICD-10-CM | POA: Diagnosis not present

## 2022-10-06 MED ORDER — FLUCONAZOLE 150 MG PO TABS
ORAL_TABLET | ORAL | 0 refills | Status: DC
Start: 2022-10-06 — End: 2022-12-14
  Filled 2022-10-06: qty 2, 4d supply, fill #0

## 2022-10-07 ENCOUNTER — Other Ambulatory Visit (HOSPITAL_COMMUNITY): Payer: Self-pay

## 2022-10-07 MED ORDER — ESTRADIOL 10 MCG VA TABS
10.0000 ug | ORAL_TABLET | VAGINAL | 11 refills | Status: DC
  Filled 2022-10-07: qty 8, 28d supply, fill #0
  Filled 2022-11-04: qty 8, 28d supply, fill #1
  Filled 2022-11-28: qty 8, 28d supply, fill #2
  Filled 2023-02-03: qty 8, 28d supply, fill #3
  Filled 2023-04-06: qty 8, 28d supply, fill #4
  Filled 2023-09-21: qty 8, 28d supply, fill #5

## 2022-10-08 ENCOUNTER — Other Ambulatory Visit (HOSPITAL_COMMUNITY): Payer: Self-pay

## 2022-10-10 ENCOUNTER — Other Ambulatory Visit (HOSPITAL_COMMUNITY): Payer: Self-pay

## 2022-10-10 DIAGNOSIS — Z961 Presence of intraocular lens: Secondary | ICD-10-CM | POA: Diagnosis not present

## 2022-10-10 DIAGNOSIS — H349 Unspecified retinal vascular occlusion: Secondary | ICD-10-CM | POA: Diagnosis not present

## 2022-10-10 MED ORDER — ESTRADIOL 10 MCG VA TABS
10.0000 ug | ORAL_TABLET | VAGINAL | 3 refills | Status: DC
  Filled 2022-10-10: qty 8, 28d supply, fill #0

## 2022-10-14 ENCOUNTER — Ambulatory Visit
Admission: RE | Admit: 2022-10-14 | Discharge: 2022-10-14 | Disposition: A | Discharge status: Home / Self Care | Payer: 59 | Source: Ambulatory Visit | Attending: Obstetrics & Gynecology | Admitting: Obstetrics & Gynecology

## 2022-10-14 DIAGNOSIS — Z1231 Encounter for screening mammogram for malignant neoplasm of breast: Secondary | ICD-10-CM | POA: Diagnosis not present

## 2022-10-16 NOTE — Progress Notes (Signed)
Cardiology Office Note:    Date:  10/16/2022   ID:  Deanna Simmons, DOB [birth year], MRN [id number]  PCP:  Josetta Huddle, MD  Cardiologist:  Sinclair Grooms, MD   Referring MD: Josetta Huddle, MD   No chief complaint on file.   History of Present Illness:    Deanna Simmons is a 71 y.o. female with a hx of   CAD s/p remote CABG x1V in 2004 by Dr. Cyndia Bent with a SVG to PDA, HTN, asthma, type 2 diabetes, SLE, prior stroke with right retinal artery occlusion, TIA in 03/2021 and severe AS s/p TAVR (07/13/21).  She is doing well.  She decided against knee replacement surgery.  She does not have any cardiac complaints.  Her TAVR valve is now 1 year all and functioning normally as outlined below.  She denies angina.  She did not have significant obstructive disease when cath was done.  The saphenous vein graft to the PDA was widely patent.  Past Medical History:  Diagnosis Date   Abnormal finding on Pap smear, ASCUS    Anemia    Asthma    Chondromalacia of knee    Coronary artery disease    Diabetes mellitus without complication ([place])    Diverticulum 12/11/2002   in cecum   Fibroids    h/o   GERD (gastroesophageal reflux disease)    H/O tinea cruris 02/28/2010   History of CVA (cerebrovascular accident)    Hypercholesteremia    Hypertension    Obesity    Pneumonia 07/01/2008   S/P TAVR (transcatheter aortic valve replacement) 07/13/2021   s/p TAVR w/ a 57m Edwards S3U via the TF approach by Dr. CBurt Knackand Dr. BCyndia Bent   Synovitis of knee    [place]ic lupus [place]matosus [place]    with nephritis   Vulvitis    h/o    Past Surgical History:  Procedure Laterality Date   CARDIAC CATHETERIZATION     CESAREAN SECTION     CORONARY ARTERY BYPASS GRAFT     Endometrial curettage  03/29/2000   INTRAOPERATIVE TRANSTHORACIC ECHOCARDIOGRAM Left 07/13/2021   Procedure: INTRAOPERATIVE TRANSTHORACIC ECHOCARDIOGRAM;  Surgeon: CSherren Mocha MD;  [place]  Service: Open Heart  Surgery;  Laterality: Left;   RIGHT/LEFT HEART CATH AND CORONARY/GRAFT ANGIOGRAPHY N/A 06/11/2021   Procedure: RIGHT/LEFT HEART CATH AND CORONARY/GRAFT ANGIOGRAPHY;  Surgeon: SBelva Crome MD;  [facility];  Service: Cardiovascular;  Laterality: N/A;   ROTATOR CUFF REPAIR     TRANSCATHETER AORTIC VALVE REPLACEMENT, TRANSFEMORAL N/A 07/13/2021   Procedure: TRANSCATHETER AORTIC VALVE REPLACEMENT, TRANSFEMORAL;  Surgeon: CSherren Mocha MD;  [place]  Service: Open Heart Surgery;  Laterality: N/A;   TUBAL LIGATION     ULTRASOUND GUIDANCE FOR VASCULAR ACCESS Bilateral 07/13/2021   Procedure: ULTRASOUND GUIDANCE FOR VASCULAR ACCESS;  Surgeon: CSherren Mocha MD;  [place] [facility]  Service: Open Heart Surgery;  Laterality: Bilateral;   UPPER GASTROINTESTINAL ENDOSCOPY  12/11/2002    Current Medications: No outpatient medications have been marked as taking for the 10/18/22 encounter (Appointment) with SBelva Crome MD.     Allergies:   Biaxin [clarithromycin], Cardiolite [technetium-961m Ciprofloxacin, Erythromycin base, Montelukast sodium, Niaspan [niacin er], Penicillins, Prilosec [omeprazole], Singulair [montelukast], Ticlid [ticlopidine hcl], and Iodinated contrast media   Social History   Socioeconomic History   Marital status: Married    Spouse name: Deanna Simmons Number of children: 4   Years of education: 12+   Highest education level:  Not on file  Occupational History   Occupation: CARE MANAGEMENT AS[employer]    Employer: [employer]  Tobacco Use   Smoking status: Former   Smokeless tobacco: Never  Substance and Sexual Activity   Alcohol use: Yes    Comment: occasional   Drug use: No   Sexual activity: Not on file  Other Topics Concern   Not on file  Social History Narrative   Lives with her husband. Their children are grown, 1 lives in [place], 1 in [place], [place],  1 in [place], [place], and one in [place], [place].   Right handed   Social  Determinants of Health   Financial Resource Strain: Not on file  Food Insecurity: Not on file  Transportation Needs: Not on file  Physical Activity: Not on file  Stress: Not on file  Social Connections: Not on file     Family History: The patient's family history includes Breast cancer (age of onset: 37) in her mother; Diabetes in her father and mother; Heart attack in her paternal uncle; Hyperlipidemia in her father; Ovarian cancer in her cousin; Prostate cancer in her father; Stroke in her mother.  ROS:   Please see the history of present illness.    Some difficulty ambulating because of left knee arthritis.  All other systems reviewed and are negative.  EKGs/Labs/Other Studies Reviewed:    The following studies were reviewed today:  2 D Doppler ECHOCARDIOGRAM: IMPRESSIONS   1. Left ventricular ejection fraction, by estimation, is 60 to 65%. The  left ventricle has normal function. The left ventricle has no regional  wall motion abnormalities. There is mild asymmetric left ventricular  hypertrophy of the basal-septal segment.  Left ventricular diastolic parameters are consistent with Grade I  diastolic dysfunction (impaired relaxation). Elevated left ventricular  end-diastolic pressure.   2. Right ventricular systolic function is normal. The right ventricular  size is normal.   3. Left atrial size was mildly dilated.   4. The mitral valve is normal in structure. Mild mitral valve  regurgitation. No evidence of mitral stenosis. Severe mitral annular  calcification.   5. TAVR gradients are stable. The aortic valve has been  repaired/replaced. Aortic valve regurgitation is not visualized. No aortic  stenosis is present. Echo findings are consistent with normal structure  and function of the aortic valve prosthesis.  Aortic valve mean gradient measures 14.0 mmHg. Aortic valve Vmax measures  2.56 m/s.   6. The inferior vena cava is normal in size with greater than 50%   respiratory variability, suggesting right atrial pressure of 3 mmHg.   EKG:  EKG no new tracing  Recent Labs: No results found for requested labs within last 365 days.  Recent Lipid Panel    Component Value Date/Time   CHOL 120 03/11/2014 0841   TRIG 65.0 03/11/2014 0841   HDL 41.00 03/11/2014 0841   CHOLHDL 3 03/11/2014 0841   VLDL 13.0 03/11/2014 0841   LDLCALC 66 03/11/2014 0841    Physical Exam:    VS:  There were no vitals taken for this visit.    Wt Readings from Last 3 Encounters:  09/06/22 217 lb 6.4 oz (98.6 kg)  07/13/22 220 lb 9.6 oz (100.1 kg)  05/24/22 223 lb (101.2 kg)     GEN: Obese. No acute distress HEENT: Normal NECK: No JVD. LYMPHATICS: No lymphadenopathy CARDIAC: No diastolic but 1/6 right upper sternal systolic aortic valve murmur. RRR no gallop, or edema. VASCULAR:  Normal Pulses. No bruits. RESPIRATORY:  Clear  to auscultation without rales, wheezing or rhonchi  ABDOMEN: Soft, non-tender, non-distended, No pulsatile mass, MUSCULOSKELETAL: No deformity  SKIN: Warm and dry NEUROLOGIC:  Alert and oriented x 3 PSYCHIATRIC:  Normal affect   ASSESSMENT:    1. S/P TAVR (transcatheter aortic valve replacement)   2. Coronary artery disease of bypass graft of native heart with stable angina pectoris ([place])   3. Essential hypertension   4. Hypercholesteremia   5. Type 2 diabetes mellitus with other circulatory complication, without long-term current use of insulin (HCC)    PLAN:    In order of problems listed above:  Ceniyah should attach to Dr. Burt Knack as her primary Cardiologist.  She has coronary artery disease with a greater than 48 year old saphenous vein graft.  She is status post TAVR by Dr. Burt Knack. Secondary prevention reviewed and discussed. Continue the current therapy with Toprol-XL, and blood pressure is being helped by [place]. Continue rosuvastatin with target less than 70. Continue Farxiga for cardiac and kidney protection as well as  glycemic control.   Overall education and awareness concerning primary/secondary risk prevention was discussed in detail: LDL less than 70, hemoglobin A1c less than 7, blood pressure target less than 130/80 mmHg, >150 minutes of moderate aerobic activity per week, avoidance of smoking, weight control (via diet and exercise), and continued surveillance/management of/for obstructive sleep apnea.    Medication Adjustments/Labs and Tests Ordered: Current medicines are reviewed at length with the patient today.  Concerns regarding medicines are outlined above.  No orders of the defined types were placed in this encounter.  No orders of the defined types were placed in this encounter.   There are no Patient Instructions on file for this visit.   Signed, Sinclair Grooms, MD  10/16/2022 1:57 PM    [facility]

## 2022-10-17 DIAGNOSIS — I83892 Varicose veins of left lower extremities with other complications: Secondary | ICD-10-CM | POA: Diagnosis not present

## 2022-10-18 ENCOUNTER — Ambulatory Visit: Payer: 59 | Attending: Interventional Cardiology | Admitting: Interventional Cardiology

## 2022-10-18 ENCOUNTER — Encounter: Payer: Self-pay | Admitting: Interventional Cardiology

## 2022-10-18 VITALS — BP 120/84 | HR 78 | Ht 64.0 in | Wt 223.2 lb

## 2022-10-18 DIAGNOSIS — E7800 Pure hypercholesterolemia, unspecified: Secondary | ICD-10-CM | POA: Diagnosis not present

## 2022-10-18 DIAGNOSIS — I10 Essential (primary) hypertension: Secondary | ICD-10-CM | POA: Diagnosis not present

## 2022-10-18 DIAGNOSIS — E1159 Type 2 diabetes mellitus with other circulatory complications: Secondary | ICD-10-CM

## 2022-10-18 DIAGNOSIS — Z952 Presence of prosthetic heart valve: Secondary | ICD-10-CM | POA: Diagnosis not present

## 2022-10-18 DIAGNOSIS — I25708 Atherosclerosis of coronary artery bypass graft(s), unspecified, with other forms of angina pectoris: Secondary | ICD-10-CM

## 2022-10-18 NOTE — Patient Instructions (Signed)
Medication Instructions:  Your physician recommends that you continue on your current medications as directed. Please refer to the Current Medication list given to you today.  *If you need a refill on your cardiac medications before your next appointment, please call your pharmacy*  Follow-Up: At [facility], you and your health needs are our priority.  As part of our continuing mission to provide you with exceptional heart care, we have created designated Provider Care Teams.  These Care Teams include your primary Cardiologist (physician) and Advanced Practice Providers (APPs -  Physician Assistants and Nurse Practitioners) who all work together to provide you with the care you need, when you need it.  Your next appointment:   9-12 month(s)  The format for your next appointment:   In Person  Provider:   Sherren Mocha, MD  Important Information About Sugar

## 2022-11-04 ENCOUNTER — Other Ambulatory Visit (HOSPITAL_COMMUNITY): Payer: Self-pay

## 2022-11-08 ENCOUNTER — Other Ambulatory Visit (HOSPITAL_COMMUNITY): Payer: Self-pay

## 2022-11-08 MED ORDER — CICLOPIROX 8 % EX SOLN
CUTANEOUS | 0 refills | Status: DC
  Filled 2022-11-08: qty 13.2, 30d supply, fill #0

## 2022-11-09 ENCOUNTER — Other Ambulatory Visit (HOSPITAL_COMMUNITY): Payer: Self-pay

## 2022-11-10 ENCOUNTER — Other Ambulatory Visit (HOSPITAL_COMMUNITY): Payer: Self-pay

## 2022-11-23 ENCOUNTER — Other Ambulatory Visit (HOSPITAL_COMMUNITY): Payer: Self-pay

## 2022-11-23 DIAGNOSIS — M62830 Muscle spasm of back: Secondary | ICD-10-CM | POA: Diagnosis not present

## 2022-11-23 MED ORDER — TIZANIDINE HCL 4 MG PO TABS
4.0000 mg | ORAL_TABLET | Freq: Three times a day (TID) | ORAL | 0 refills | Status: DC | PRN
Start: 2022-11-23 — End: 2024-03-19
  Filled 2022-11-23 (×3): qty 30, 10d supply, fill #0

## 2022-11-28 ENCOUNTER — Other Ambulatory Visit (HOSPITAL_COMMUNITY): Payer: Self-pay

## 2022-11-29 ENCOUNTER — Ambulatory Visit: Payer: 59 | Admitting: Allergy and Immunology

## 2022-11-29 ENCOUNTER — Other Ambulatory Visit (HOSPITAL_COMMUNITY): Payer: Self-pay

## 2022-11-29 ENCOUNTER — Ambulatory Visit: Payer: Medicare Other | Admitting: Allergy and Immunology

## 2022-12-03 ENCOUNTER — Other Ambulatory Visit (HOSPITAL_COMMUNITY): Payer: Self-pay

## 2022-12-14 ENCOUNTER — Other Ambulatory Visit (HOSPITAL_COMMUNITY): Payer: Self-pay

## 2022-12-14 DIAGNOSIS — I83892 Varicose veins of left lower extremities with other complications: Secondary | ICD-10-CM | POA: Diagnosis not present

## 2022-12-14 DIAGNOSIS — D509 Iron deficiency anemia, unspecified: Secondary | ICD-10-CM | POA: Diagnosis not present

## 2022-12-14 DIAGNOSIS — J45909 Unspecified asthma, uncomplicated: Secondary | ICD-10-CM | POA: Diagnosis not present

## 2022-12-14 DIAGNOSIS — I10 Essential (primary) hypertension: Secondary | ICD-10-CM | POA: Diagnosis not present

## 2022-12-14 DIAGNOSIS — E559 Vitamin D deficiency, unspecified: Secondary | ICD-10-CM | POA: Diagnosis not present

## 2022-12-14 DIAGNOSIS — G4733 Obstructive sleep apnea (adult) (pediatric): Secondary | ICD-10-CM | POA: Diagnosis not present

## 2022-12-14 DIAGNOSIS — N9489 Other specified conditions associated with female genital organs and menstrual cycle: Secondary | ICD-10-CM | POA: Diagnosis not present

## 2022-12-14 DIAGNOSIS — I2510 Atherosclerotic heart disease of native coronary artery without angina pectoris: Secondary | ICD-10-CM | POA: Diagnosis not present

## 2022-12-14 DIAGNOSIS — E1142 Type 2 diabetes mellitus with diabetic polyneuropathy: Secondary | ICD-10-CM | POA: Diagnosis not present

## 2022-12-14 DIAGNOSIS — B3731 Acute candidiasis of vulva and vagina: Secondary | ICD-10-CM | POA: Diagnosis not present

## 2022-12-14 DIAGNOSIS — Z0000 Encounter for general adult medical examination without abnormal findings: Secondary | ICD-10-CM | POA: Diagnosis not present

## 2022-12-14 DIAGNOSIS — E785 Hyperlipidemia, unspecified: Secondary | ICD-10-CM | POA: Diagnosis not present

## 2022-12-14 DIAGNOSIS — I872 Venous insufficiency (chronic) (peripheral): Secondary | ICD-10-CM | POA: Diagnosis not present

## 2022-12-14 DIAGNOSIS — Z952 Presence of prosthetic heart valve: Secondary | ICD-10-CM | POA: Diagnosis not present

## 2022-12-14 MED ORDER — VALACYCLOVIR HCL 500 MG PO TABS: 500.0000 mg | ORAL_TABLET | Freq: Two times a day (BID) | ORAL | 3 refills | Status: DC | PRN

## 2022-12-14 MED ORDER — FLUCONAZOLE 150 MG PO TABS
ORAL_TABLET | ORAL | 0 refills | Status: DC
Start: 2022-12-14 — End: 2023-06-20
  Filled 2022-12-14: qty 2, 3d supply, fill #0

## 2022-12-14 MED ORDER — VALACYCLOVIR HCL 500 MG PO TABS
500.0000 mg | ORAL_TABLET | Freq: Two times a day (BID) | ORAL | 3 refills | Status: DC
Start: 2022-12-14 — End: 2023-03-07
  Filled 2022-12-14: qty 60, 30d supply, fill #0
  Filled 2023-03-06: qty 60, 30d supply, fill #1

## 2022-12-17 ENCOUNTER — Other Ambulatory Visit (HOSPITAL_COMMUNITY): Payer: Self-pay

## 2022-12-19 ENCOUNTER — Other Ambulatory Visit (HOSPITAL_COMMUNITY): Payer: Self-pay

## 2022-12-19 MED ORDER — VALACYCLOVIR HCL 500 MG PO TABS: 500.0000 mg | ORAL_TABLET | Freq: Two times a day (BID) | ORAL | 3 refills | Status: AC | PRN

## 2022-12-20 ENCOUNTER — Other Ambulatory Visit (HOSPITAL_COMMUNITY): Payer: Self-pay

## 2022-12-21 DIAGNOSIS — M79644 Pain in right finger(s): Secondary | ICD-10-CM | POA: Diagnosis not present

## 2022-12-27 ENCOUNTER — Other Ambulatory Visit: Payer: Self-pay

## 2022-12-27 ENCOUNTER — Encounter: Payer: Self-pay | Admitting: Allergy and Immunology

## 2022-12-27 ENCOUNTER — Other Ambulatory Visit (HOSPITAL_COMMUNITY): Payer: Self-pay

## 2022-12-27 ENCOUNTER — Ambulatory Visit: Payer: Commercial Managed Care - PPO | Admitting: Allergy and Immunology

## 2022-12-27 VITALS — BP 100/80 | HR 81 | Temp 98.0°F | Ht 64.0 in | Wt 220.5 lb

## 2022-12-27 DIAGNOSIS — K219 Gastro-esophageal reflux disease without esophagitis: Secondary | ICD-10-CM

## 2022-12-27 DIAGNOSIS — J4540 Moderate persistent asthma, uncomplicated: Secondary | ICD-10-CM

## 2022-12-27 DIAGNOSIS — J3089 Other allergic rhinitis: Secondary | ICD-10-CM

## 2022-12-27 DIAGNOSIS — J302 Other seasonal allergic rhinitis: Secondary | ICD-10-CM

## 2022-12-27 MED ORDER — SPIRIVA RESPIMAT 1.25 MCG/ACT IN AERS
2.0000 | INHALATION_SPRAY | Freq: Every day | RESPIRATORY_TRACT | 5 refills | Status: DC | PRN
  Filled 2022-12-27 – 2023-04-07 (×2): qty 4, 30d supply, fill #0

## 2022-12-27 MED ORDER — FLUTICASONE-SALMETEROL 250-50 MCG/ACT IN AEPB
1.0000 | INHALATION_SPRAY | Freq: Two times a day (BID) | RESPIRATORY_TRACT | 5 refills | Status: DC
  Filled 2022-12-27 – 2023-01-09 (×2): qty 60, 30d supply, fill #0
  Filled 2023-03-03: qty 60, 30d supply, fill #1
  Filled 2023-08-05: qty 60, 30d supply, fill #2
  Filled 2023-09-21: qty 60, 30d supply, fill #3

## 2022-12-27 MED ORDER — FLUTICASONE PROPIONATE 50 MCG/ACT NA SUSP
1.0000 | Freq: Every day | NASAL | 1 refills | Status: DC
  Filled 2022-12-27: qty 16, 60d supply, fill #0

## 2022-12-27 MED ORDER — ALBUTEROL SULFATE HFA 108 (90 BASE) MCG/ACT IN AERS
2.0000 | INHALATION_SPRAY | RESPIRATORY_TRACT | 1 refills | Status: DC | PRN
  Filled 2022-12-27: qty 6.7, 16d supply, fill #0

## 2022-12-27 MED ORDER — FAMOTIDINE 40 MG PO TABS
40.0000 mg | ORAL_TABLET | Freq: Every morning | ORAL | 1 refills | Status: DC
  Filled 2022-12-27 – 2023-02-03 (×2): qty 90, 90d supply, fill #0
  Filled 2023-04-29: qty 90, 90d supply, fill #1

## 2022-12-27 MED ORDER — ARNUITY ELLIPTA 200 MCG/ACT IN AEPB
1.0000 | INHALATION_SPRAY | Freq: Every day | RESPIRATORY_TRACT | 1 refills | Status: DC
  Filled 2022-12-27 – 2023-02-03 (×2): qty 30, 30d supply, fill #0

## 2022-12-27 MED ORDER — CETIRIZINE HCL 10 MG PO TABS
10.0000 mg | ORAL_TABLET | Freq: Every day | ORAL | 5 refills | Status: DC | PRN
  Filled 2022-12-27: qty 60, 60d supply, fill #0

## 2022-12-27 NOTE — Progress Notes (Unsigned)
[place]   Follow-up Note  Referring Provider: Josetta Huddle, MD Primary Provider: Josetta Huddle, MD Date of Office Visit: 12/27/2022  Subjective:   Deanna Simmons (DOB: [birth year]) is a 72 y.o. female who returns to the [facility] on 12/27/2022 in re-evaluation of the following:  HPI: [name] returns to this clinic in reevaluation of asthma, allergic rhinitis, LPR.  I have not seen her in this clinic since 06 September 2022.  Overall she is doing very well with her asthma and it does not really cause her much problem and she rarely has to use a short acting bronchodilator.  But she had some type of flareup in January 2024 and this may have been associated with a viral infection then she activated her action plan and she is much better regarding that issue but she still has some dyspnea on exertion and must use a short acting bronchodilator.  She thinks that she is improving every week from that event.  Her nose is doing pretty well at this point in time.  Her reflux is under very good control.  She has received the flu vaccine and the RSV vaccine.  Allergies as of 12/27/2022       Reactions   Biaxin [clarithromycin] Itching   Cardiolite [technetium-63m Itching   Ciprofloxacin Itching   Erythromycin Base Itching   Montelukast Sodium Itching   Niaspan [niacin Er] Other (See Comments)   flushing   Penicillins Itching   Reaction: over 10 years   Prilosec [omeprazole] Itching   Singulair [montelukast] Hives, Itching   Ticlid [ticlopidine Hcl] Itching   Iodinated Contrast Media Itching   Pt has a potential contrast allergy; She states in 2012 her cardiologist told her she had a reaction during the "study". We are unable to determine the study or confirm a reaction. The patient will have a 13 hr prep for future studies. Molecular Imaging states their injection does not cause any reactions.        Medication List     acetaminophen 500 MG tablet Commonly known as: TYLENOL Take 1,000 mg by mouth every 6 (six) hours as needed for mild pain.   Advair Diskus 250-50 MCG/ACT Aepb Generic drug: fluticasone-salmeterol Inhale 1-2 puffs into the lungs 1-2 times daily.   albuterol 108 (90 Base) MCG/ACT inhaler Commonly known as: Ventolin HFA INHALE 2 PUFFS BY MOUTH EVERY 4-6 HOURS AS NEEDED FOR COUGH OR WHEEZE   Arnuity Ellipta 200 MCG/ACT Aepb Generic drug: Fluticasone Furoate Inhale 1 puff into the lungs daily. Use during asthma flares as directed.   ascorbic acid 500 MG tablet Commonly known as: VITAMIN C Take 500 mg by mouth daily.   azithromycin 500 MG tablet Commonly known as: Zithromax Take one tablet by mouth1 hour before any dental work including cleanings.   cetirizine 10 MG tablet Commonly known as: ZYRTEC Take 1 tablet (10 mg total) by mouth daily as needed for allergies (Can use an extra dose during flares).   ciclopirox 8 % solution Commonly known as: PENLAC Apply to affected nails & surrounding skin at bedtime and every 7 days clean nails with alcohol as directed.   clopidogrel 75 MG tablet Commonly known as: PLAVIX Take 1 tablet (75 mg total) by mouth daily.   Estradiol 10 MCG Tabs vaginal tablet Place 1 tablet (10 mcg total) vaginally 2 (two) times a week as directed.   ezetimibe 10 MG tablet Commonly known as: ZETIA Take  1 tablet (10 mg total) by mouth daily.   famotidine 40 MG tablet Commonly known as: PEPCID Take 1 tablet (40 mg total) by mouth daily.   Farxiga 10 MG Tabs tablet Generic drug: dapagliflozin propanediol Take 1 tablet by mouth daily   ferrous sulfate 325 (65 FE) MG tablet Take 325 mg by mouth daily.   fluconazole 150 MG tablet Commonly known as: DIFLUCAN Take 1 tablet by mouth today and if symptoms continue take another tablet in 3 days   fluticasone 50 MCG/ACT nasal spray Commonly known as: FLONASE Place 1 spray into both nostrils daily.    glucose blood test strip Use to test blood glucose twice daily before meals.   magnesium gluconate 500 MG tablet Commonly known as: MAGONATE Take 500 mg by mouth daily as needed (cramps).   metoprolol succinate 25 MG 24 hr tablet Commonly known as: TOPROL-XL TAKE 1 TABLET BY MOUTH TWICE DAILY   MULTIVITAL PO Take 1 tablet by mouth daily.   nitroGLYCERIN 0.4 MG SL tablet Commonly known as: NITROSTAT Place 1 tablet (0.4 mg total) under the tongue every 5 (five) minutes as needed for up to 25 days for chest pain.   PROBIOTIC DAILY PO Take 1 capsule by mouth at bedtime. Ultra Flora IB   rosuvastatin 40 MG tablet Commonly known as: CRESTOR Take 1 tablet (40 mg total) by mouth daily.   Spiriva Respimat 1.25 MCG/ACT Aers Generic drug: Tiotropium Bromide Monohydrate Inhale 2 puffs by mouth daily if needed   tiZANidine 4 MG tablet Commonly known as: ZANAFLEX Take 1 tablet (4 mg total) by mouth 3 (three) times daily as needed for muscle spasm   valACYclovir 500 MG tablet Commonly known as: VALTREX Take 1 tablet (500 mg total) by mouth 2 (two) times daily for 3 days if with a HSV outbreak   valACYclovir 500 MG tablet Commonly known as: VALTREX TAKE 1 TABLET BY MOUTH TWICE DAILY AS NEEDED   VITAMIN K2-VITAMIN D3 PO Take 1 tablet by mouth every other day.   zinc gluconate 50 MG tablet Take 50 mg by mouth every evening.    Past Medical History:  Diagnosis Date   Abnormal finding on Pap smear, ASCUS    Anemia    Asthma    Chondromalacia of knee    Coronary artery disease    Diabetes mellitus without complication ([place])    Diverticulum 12/11/2002   in cecum   Fibroids    h/o   GERD (gastroesophageal reflux disease)    H/O tinea cruris 02/28/2010   History of CVA (cerebrovascular accident)    Hypercholesteremia    Hypertension    Obesity    Pneumonia 07/01/2008   S/P TAVR (transcatheter aortic valve replacement) 07/13/2021   s/p TAVR w/ a 75m Edwards S3U via the  TF approach by Dr. CBurt Knackand Dr. BCyndia Bent   Synovitis of knee    Systemic lupus [place]matosus [place]    with nephritis   Vulvitis    h/o    Past Surgical History:  Procedure Laterality Date   CARDIAC CATHETERIZATION     CESAREAN SECTION     CORONARY ARTERY BYPASS GRAFT     Endometrial curettage  03/29/2000   INTRAOPERATIVE TRANSTHORACIC ECHOCARDIOGRAM Left 07/13/2021   Procedure: INTRAOPERATIVE TRANSTHORACIC ECHOCARDIOGRAM;  Surgeon: CSherren Mocha MD;  [place]  Service: Open Heart Surgery;  Laterality: Left;   RIGHT/LEFT HEART CATH AND CORONARY/GRAFT ANGIOGRAPHY N/A 06/11/2021   Procedure: RIGHT/LEFT HEART CATH AND CORONARY/GRAFT ANGIOGRAPHY;  Surgeon: STamala Julian  Lynnell Dike, MD;  Location: [place] [facility];  Service: Cardiovascular;  Laterality: N/A;   ROTATOR CUFF REPAIR     TRANSCATHETER AORTIC VALVE REPLACEMENT, TRANSFEMORAL N/A 07/13/2021   Procedure: TRANSCATHETER AORTIC VALVE REPLACEMENT, TRANSFEMORAL;  Surgeon: Sherren Mocha, MD;  Location: [place];  Service: Open Heart Surgery;  Laterality: N/A;   TUBAL LIGATION     ULTRASOUND GUIDANCE FOR VASCULAR ACCESS Bilateral 07/13/2021   Procedure: ULTRASOUND GUIDANCE FOR VASCULAR ACCESS;  Surgeon: Sherren Mocha, MD;  Location: [place];  Service: Open Heart Surgery;  Laterality: Bilateral;   UPPER GASTROINTESTINAL ENDOSCOPY  12/11/2002    Review of systems negative except as noted in HPI / PMHx or noted below:  Review of Systems  Constitutional: Negative.   HENT: Negative.    Eyes: Negative.   Respiratory: Negative.    Cardiovascular: Negative.   Gastrointestinal: Negative.   Genitourinary: Negative.   Musculoskeletal: Negative.   Skin: Negative.   Neurological: Negative.   Endo/Heme/Allergies: Negative.   Psychiatric/Behavioral: Negative.       Objective:   Vitals:   12/27/22 1639  BP: 100/80  Pulse: 81  Temp: 98 F (36.7 C)  SpO2: 97%   Height: '5\' 4"'$  (162.6 cm)  Weight: 220 lb 8 oz (100 kg)   Physical  Exam Constitutional:      Appearance: She is not diaphoretic.  HENT:     Head: Normocephalic.     Right Ear: Tympanic membrane, ear canal and external ear normal.     Left Ear: Tympanic membrane, ear canal and external ear normal.     Nose: Nose normal. No mucosal edema or rhinorrhea.     Mouth/Throat:     Pharynx: Uvula midline. No oropharyngeal exudate.  Eyes:     Conjunctiva/sclera: Conjunctivae normal.  Neck:     Thyroid: No thyromegaly.     Trachea: Trachea normal. No tracheal tenderness or tracheal deviation.  Cardiovascular:     Rate and Rhythm: Normal rate and regular rhythm.     Heart sounds: Normal heart sounds, S1 normal and S2 normal. No murmur heard. Pulmonary:     Effort: No respiratory distress.     Breath sounds: Normal breath sounds. No stridor. No wheezing or rales.  Lymphadenopathy:     Head:     Right side of head: No tonsillar adenopathy.     Left side of head: No tonsillar adenopathy.     Cervical: No cervical adenopathy.  Skin:    Findings: No erythema or rash.     Nails: There is no clubbing.  Neurological:     Mental Status: She is alert.     Diagnostics:    Spirometry was performed and demonstrated an FEV1 of 1.76 at 98 % of predicted.  Assessment and Plan:   No diagnosis found.   1.  Continue Advair 250 1 inhalation 1-2 times per day depending on disease activity  2.  Continue Spiriva 1.25 Respimat - 2 inhalations 1 time per day IF NEEDED  2.  Continue Flonase 1 spray each nostril 3-7 times per week depending on disease activity  3. Continue Famotidine 40 mg tablet 1 time per day  4.  "Action plan" for asthma flare:   A. Add Arnuity 200 - 1 inhalation 2 times per day  B. Use Albuterol HFA -2 puffs every 4-6 hours if needed  C. Increase famotidine to 40 mg 2 times per day  5. Zyrtec 10 mg one tablet one time per day if needed  6.  Return to clinic in 6 months or earlier if problem  I am going to have Leveta add in her Spiriva for a  few weeks while she recovers from this viral respiratory tract infection.  Overall she has done pretty well with using her Advair regarding her asthma and she is done well by using some Flonase for her upper airway and using some famotidine for reflux.  Not really any change around much of her medications at this point.  We did have a discussion about obtaining a cancer screening CT scan.  She is already had a chest CT scan angio performed 29 June 2021 which did not identify any obvious parenchymal abnormality.  I am not sure I want to exposure to any more radiation by getting a cancer screening CT scan at this point.    Allena Katz, MD Allergy / Immunology [place]

## 2022-12-27 NOTE — Patient Instructions (Addendum)
  1.  Continue Advair 250 1 inhalation 1-2 times per day depending on disease activity  2.  Continue Spiriva 1.25 Respimat - 2 inhalations 1 time per day IF NEEDED  2.  Continue Flonase 1 spray each nostril 3-7 times per week depending on disease activity  3. Continue Famotidine 40 mg tablet 1 time per day  4.  "Action plan" for asthma flare:   A. Add Arnuity 200 - 1 inhalation 2 times per day  B. Use Albuterol HFA -2 puffs every 4-6 hours if needed  C. Increase famotidine to 40 mg 2 times per day  5. Zyrtec 10 mg one tablet one time per day if needed  6. Return to clinic in 6 months or earlier if problem

## 2022-12-28 ENCOUNTER — Telehealth: Payer: Self-pay | Admitting: *Deleted

## 2022-12-28 ENCOUNTER — Other Ambulatory Visit: Payer: Self-pay

## 2022-12-28 ENCOUNTER — Other Ambulatory Visit (HOSPITAL_COMMUNITY): Payer: Self-pay

## 2022-12-28 NOTE — Telephone Encounter (Signed)
Called patient and informed, patient verbalized understanding.

## 2022-12-28 NOTE — Addendum Note (Signed)
Addended by: Chip Boer R on: 12/28/2022 01:10 PM   Modules accepted: Orders

## 2022-12-28 NOTE — Telephone Encounter (Signed)
-----   Message from Jiles Prows, MD sent at 12/28/2022  5:58 AM EST ----- Please let [name] know that she had a chest CT scan in 2022 and everything was OK with that scan.

## 2023-01-06 ENCOUNTER — Other Ambulatory Visit (HOSPITAL_COMMUNITY): Payer: Self-pay

## 2023-01-09 ENCOUNTER — Other Ambulatory Visit: Payer: Self-pay

## 2023-01-09 ENCOUNTER — Other Ambulatory Visit (HOSPITAL_COMMUNITY): Payer: Self-pay

## 2023-01-10 ENCOUNTER — Other Ambulatory Visit (HOSPITAL_COMMUNITY): Payer: Self-pay

## 2023-01-10 MED ORDER — METOPROLOL TARTRATE 25 MG PO TABS
25.0000 mg | ORAL_TABLET | Freq: Two times a day (BID) | ORAL | 3 refills | Status: DC
  Filled 2023-01-10: qty 180, 90d supply, fill #0

## 2023-01-11 ENCOUNTER — Other Ambulatory Visit (HOSPITAL_COMMUNITY): Payer: Self-pay

## 2023-01-12 ENCOUNTER — Other Ambulatory Visit (HOSPITAL_COMMUNITY): Payer: Self-pay

## 2023-01-12 MED ORDER — METOPROLOL SUCCINATE ER 25 MG PO TB24
25.0000 mg | ORAL_TABLET | Freq: Two times a day (BID) | ORAL | 3 refills | Status: DC
  Filled 2023-01-12: qty 180, 90d supply, fill #0

## 2023-02-03 ENCOUNTER — Other Ambulatory Visit: Payer: Self-pay

## 2023-02-03 ENCOUNTER — Other Ambulatory Visit (HOSPITAL_COMMUNITY): Payer: Self-pay

## 2023-02-07 ENCOUNTER — Other Ambulatory Visit (HOSPITAL_COMMUNITY): Payer: Self-pay

## 2023-02-08 ENCOUNTER — Other Ambulatory Visit: Payer: Self-pay | Admitting: Cardiovascular Disease

## 2023-02-08 ENCOUNTER — Telehealth: Payer: Self-pay | Admitting: Interventional Cardiology

## 2023-02-08 ENCOUNTER — Other Ambulatory Visit (HOSPITAL_COMMUNITY): Payer: Self-pay

## 2023-02-08 DIAGNOSIS — I87392 Chronic venous hypertension (idiopathic) with other complications of left lower extremity: Secondary | ICD-10-CM | POA: Diagnosis not present

## 2023-02-08 DIAGNOSIS — I872 Venous insufficiency (chronic) (peripheral): Secondary | ICD-10-CM | POA: Diagnosis not present

## 2023-02-08 MED ORDER — CLOPIDOGREL BISULFATE 75 MG PO TABS
75.0000 mg | ORAL_TABLET | Freq: Every day | ORAL | 2 refills | Status: DC
  Filled 2023-02-08: qty 90, 90d supply, fill #0
  Filled 2023-05-19 – 2023-06-01 (×2): qty 90, 90d supply, fill #1
  Filled 2023-08-28: qty 90, 90d supply, fill #2

## 2023-02-08 NOTE — Telephone Encounter (Signed)
Pt's medication was sent to pt's pharmacy as requested. Confirmation received.  °

## 2023-02-08 NOTE — Telephone Encounter (Signed)
*  STAT* If patient is at the pharmacy, call can be transferred to refill team.   1. Which medications need to be refilled? (please list name of each medication and dose if known) clopidogrel (PLAVIX) 75 MG tablet   2. Which pharmacy/location (including street and city if local pharmacy) is medication to be sent to?  [place] [facility]    3. Do they need a 30 day or 90 day supply? 90

## 2023-02-09 ENCOUNTER — Other Ambulatory Visit (HOSPITAL_COMMUNITY): Payer: Self-pay

## 2023-02-14 DIAGNOSIS — M25561 Pain in right knee: Secondary | ICD-10-CM | POA: Diagnosis not present

## 2023-02-16 DIAGNOSIS — Z1211 Encounter for screening for malignant neoplasm of colon: Secondary | ICD-10-CM | POA: Diagnosis not present

## 2023-02-16 DIAGNOSIS — K5730 Diverticulosis of large intestine without perforation or abscess without bleeding: Secondary | ICD-10-CM | POA: Diagnosis not present

## 2023-02-16 DIAGNOSIS — K219 Gastro-esophageal reflux disease without esophagitis: Secondary | ICD-10-CM | POA: Diagnosis not present

## 2023-02-27 ENCOUNTER — Telehealth: Payer: Self-pay | Admitting: Allergy and Immunology

## 2023-02-27 NOTE — Telephone Encounter (Signed)
Called and spoke with the patient she stated that the runny nose and sneezing started today. She states that she has been using her Flonase once daily every day and taking Zyrtec once daily every day.

## 2023-02-27 NOTE — Telephone Encounter (Signed)
Patient called and said that her nose is running all the time and she is using a nose spray and has appointment on 03/07/2023. Uses the [facility] out patient pharmacy [phone].

## 2023-02-28 NOTE — Telephone Encounter (Signed)
Called and left a voicemail asking for return call to inform, if interested will send in Ipratropium nasal spray.

## 2023-02-28 NOTE — Telephone Encounter (Signed)
Called and spoke with the patient and advised of Dr. Kathyrn Lass recommendation. Patient verbalized understanding and wants to hold off on the nasal spray for now.

## 2023-03-03 ENCOUNTER — Other Ambulatory Visit (HOSPITAL_COMMUNITY): Payer: Self-pay

## 2023-03-06 ENCOUNTER — Other Ambulatory Visit (HOSPITAL_COMMUNITY): Payer: Self-pay

## 2023-03-07 ENCOUNTER — Ambulatory Visit: Payer: Medicare Other | Admitting: Allergy and Immunology

## 2023-03-07 VITALS — BP 114/76 | HR 68 | Temp 98.2°F | Resp 18 | Ht 64.0 in | Wt 216.6 lb

## 2023-03-07 DIAGNOSIS — J4540 Moderate persistent asthma, uncomplicated: Secondary | ICD-10-CM

## 2023-03-07 NOTE — Progress Notes (Unsigned)
Deanna Simmons presents to this clinic for follow-up visit but there was a scheduling error and she was actually scheduled to come back in 6 months from her last appointment of 27 December 2022.  Overall she has been doing well.  She did have a slight flare of her asthma for which she activated her "action plan" and within several days everything resolved and she is back to baseline and her nasal issue and reflux issue is also under good control.  She did mention to me today that she is thinking about having her left knee replaced because now her right knee is starting to give her problems.  She is going to have her right knee injected with autologous plasma soon.  We will see her back in this clinic 6 months.

## 2023-03-08 ENCOUNTER — Encounter: Payer: Self-pay | Admitting: Allergy and Immunology

## 2023-03-13 DIAGNOSIS — M1711 Unilateral primary osteoarthritis, right knee: Secondary | ICD-10-CM | POA: Diagnosis not present

## 2023-03-16 DIAGNOSIS — M1712 Unilateral primary osteoarthritis, left knee: Secondary | ICD-10-CM | POA: Diagnosis not present

## 2023-03-16 DIAGNOSIS — M1711 Unilateral primary osteoarthritis, right knee: Secondary | ICD-10-CM | POA: Diagnosis not present

## 2023-03-20 ENCOUNTER — Telehealth: Payer: Self-pay | Admitting: *Deleted

## 2023-03-20 NOTE — Telephone Encounter (Signed)
   Pre-operative Risk Assessment    Patient Name: Deanna Simmons  DOB: 26-Sep-1951 MRN: [id number]      Request for Surgical Clearance    Procedure:   Left total knee arthroplasty  Date of Surgery:  Clearance 07/10/23                                 Surgeon:  Dr. Ollen Gross Surgeon's Group or Practice Name:  [facility] Phone number:  [phone] Fax number:  [phone]   Type of Clearance Requested:   - Medical  - Pharmacy:  Hold Clopidogrel (Plavix) Not Indicated.   Type of Anesthesia:   Choice   Additional requests/questions:    Signed, Emmit Pomfret   03/20/2023, 1:39 PM

## 2023-03-21 ENCOUNTER — Encounter: Payer: Self-pay | Admitting: Cardiovascular Disease

## 2023-03-21 ENCOUNTER — Ambulatory Visit: Payer: Commercial Managed Care - PPO | Attending: Cardiovascular Disease | Admitting: Cardiovascular Disease

## 2023-03-21 VITALS — BP 113/71 | HR 73 | Ht 64.0 in | Wt 216.0 lb

## 2023-03-21 DIAGNOSIS — E1159 Type 2 diabetes mellitus with other circulatory complications: Secondary | ICD-10-CM | POA: Diagnosis not present

## 2023-03-21 DIAGNOSIS — Z952 Presence of prosthetic heart valve: Secondary | ICD-10-CM

## 2023-03-21 DIAGNOSIS — I359 Nonrheumatic aortic valve disorder, unspecified: Secondary | ICD-10-CM | POA: Diagnosis not present

## 2023-03-21 DIAGNOSIS — Z7984 Long term (current) use of oral hypoglycemic drugs: Secondary | ICD-10-CM

## 2023-03-21 DIAGNOSIS — E782 Mixed hyperlipidemia: Secondary | ICD-10-CM

## 2023-03-21 DIAGNOSIS — I25708 Atherosclerosis of coronary artery bypass graft(s), unspecified, with other forms of angina pectoris: Secondary | ICD-10-CM

## 2023-03-21 DIAGNOSIS — I10 Essential (primary) hypertension: Secondary | ICD-10-CM

## 2023-03-21 NOTE — Patient Instructions (Addendum)
Medication Instructions:  Your physician recommends that you continue on your current medications as directed. Please refer to the Current Medication list given to you today.  *If you need a refill on your cardiac medications before your next appointment, please call your pharmacy*   Lab Work: none If you have labs (blood work) drawn today and your tests are completely normal, you will receive your results only by: MyChart Message (if you have MyChart) OR A paper copy in the mail If you have any lab test that is abnormal or we need to change your treatment, we will call you to review the results.   Testing/Procedures: Your physician has requested that you have an echocardiogram. Echocardiography is a painless test that uses sound waves to create images of your heart. It provides your doctor with information about the size and shape of your heart and how well your heart's chambers and valves are working. This procedure takes approximately one hour. There are no restrictions for this procedure. Please do NOT wear cologne, perfume, aftershave, or lotions (deodorant is allowed). Please arrive 15 minutes prior to your appointment time. To be done in 6 months--week or so prior to visit with APP   Follow-Up: At [facility], you and your health needs are our priority.  As part of our continuing mission to provide you with exceptional heart care, we have created designated Provider Care Teams.  These Care Teams include your primary Cardiologist (physician) and Advanced Practice Providers (APPs -  Physician Assistants and Nurse Practitioners) who all work together to provide you with the care you need, when you need it.  We recommend signing up for the patient portal called "MyChart".  Sign up information is provided on this After Visit Summary.  MyChart is used to connect with patients for Virtual Visits (Telemedicine).  Patients are able to view lab/test results, encounter notes, upcoming  appointments, etc.  Non-urgent messages can be sent to your provider as well.   To learn more about what you can do with MyChart, go to [email or web address].    Your next appointment:   6 month(s)  Provider:   Jari Favre, PA-C, Ronie Spies, PA-C, Robin Searing, NP, Jacolyn Reedy, PA-C, Eligha Bridegroom, NP, or Tereso Newcomer, PA-C     Then, Dr Excell Seltzer will plan to see you again in 12 month(s).    Other Instructions  You have been referred to the lipid clinic in our office.  Please schedule new patient appointment with the pharmacist

## 2023-03-21 NOTE — Progress Notes (Signed)
Cardiology Office Note:    Date:  03/21/2023   ID:  Deanna Simmons, DOB 01-12-1951, MRN [id number]  PCP:  Marden Noble, MD   [facility] Providers Cardiologist:  Lesleigh Noe, MD (Inactive)     Referring MD: Marden Noble, MD   Chief Complaint  Patient presents with   Coronary Artery Disease    History of Present Illness:    Deanna Simmons is a 72 y.o. female with a hx of coronary artery disease and aortic stenosis, presenting for follow-up evaluation.  The patient has a history of CAD status post remote CABG in 2004 by Dr. Laneta Simmers with a saphenous vein graft to PDA.  She is followed for aortic stenosis and underwent TAVR in September 2022.  She has previously been followed by Dr. Katrinka Blazing and I will be assuming her cardiovascular care and his retirement.  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  She is doing well and denies any chest pain, chest pressure, edema, heart palpitations, orthopnea, or PND.  She has exertional dyspnea with walking up a hill but not on level ground.  She is most limited by left knee problems and needs to have a knee replacement.    Past Medical History:  Diagnosis Date   Abnormal finding on Pap smear, ASCUS    Anemia    Asthma    Chondromalacia of knee    Coronary artery disease    Diabetes mellitus without complication (HCC)    Diverticulum 12/11/2002   in cecum   Fibroids    h/o   GERD (gastroesophageal reflux disease)    H/O tinea cruris 02/28/2010   History of CVA (cerebrovascular accident)    Hypercholesteremia    Hypertension    Obesity    Pneumonia 07/01/2008   S/P TAVR (transcatheter aortic valve replacement) 07/13/2021   s/p TAVR w/ a 23mm Edwards S3U via the TF approach by Dr. Excell Seltzer and Dr. Laneta Simmers.   Synovitis of knee    Systemic lupus erythematosus (HCC)    with nephritis   Vulvitis    h/o    Past Surgical History:  Procedure Laterality Date   CARDIAC CATHETERIZATION      CESAREAN SECTION     CORONARY ARTERY BYPASS GRAFT     Endometrial curettage  03/29/2000   INTRAOPERATIVE TRANSTHORACIC ECHOCARDIOGRAM Left 07/13/2021   Procedure: INTRAOPERATIVE TRANSTHORACIC ECHOCARDIOGRAM;  Surgeon: Tonny Bollman, MD;  Location: [facility] OR;  Service: Open Heart Surgery;  Laterality: Left;   RIGHT/LEFT HEART CATH AND CORONARY/GRAFT ANGIOGRAPHY N/A 06/11/2021   Procedure: RIGHT/LEFT HEART CATH AND CORONARY/GRAFT ANGIOGRAPHY;  Surgeon: Lyn Records, MD;  Location: [facility] INVASIVE CV LAB;  Service: Cardiovascular;  Laterality: N/A;   ROTATOR CUFF REPAIR     TRANSCATHETER AORTIC VALVE REPLACEMENT, TRANSFEMORAL N/A 07/13/2021   Procedure: TRANSCATHETER AORTIC VALVE REPLACEMENT, TRANSFEMORAL;  Surgeon: Tonny Bollman, MD;  Location: [facility] OR;  Service: Open Heart Surgery;  Laterality: N/A;   TUBAL LIGATION     ULTRASOUND GUIDANCE FOR VASCULAR ACCESS Bilateral 07/13/2021   Procedure: ULTRASOUND GUIDANCE FOR VASCULAR ACCESS;  Surgeon: Tonny Bollman, MD;  Location: [facility] OR;  Service: Open Heart Surgery;  Laterality: Bilateral;   UPPER GASTROINTESTINAL ENDOSCOPY  12/11/2002    Current Medications: Current Meds  Medication Sig   albuterol (VENTOLIN HFA) 108 (90 Base) MCG/ACT inhaler Inhale 2 puffs into the lungs every 4 to 6 hours as needed for wheezing, coughing or shortness of breath.   azithromycin (ZITHROMAX)  500 MG tablet Take one tablet by mouth1 hour before any dental work including cleanings.   cetirizine (ZYRTEC) 10 MG tablet Take 1 tablet (10 mg total) by mouth daily as needed for allergies (Can use an extra dose during flares).   clopidogrel (PLAVIX) 75 MG tablet Take 1 tablet (75 mg total) by mouth daily.   dapagliflozin propanediol (FARXIGA) 10 MG TABS tablet Take 1 tablet by mouth daily   Estradiol 10 MCG TABS vaginal tablet Place 1 tablet (10 mcg total) vaginally 2 (two) times a week as directed.   ezetimibe (ZETIA) 10 MG tablet Take 1 tablet (10 mg total) by mouth daily.    famotidine (PEPCID) 40 MG tablet Take 1 tablet (40 mg total) by mouth in the morning.   ferrous sulfate 325 (65 FE) MG tablet Take 325 mg by mouth daily.   fluconazole (DIFLUCAN) 150 MG tablet Take 1 tablet by mouth today and if symptoms continue take another tablet in 3 days   fluticasone (FLONASE) 50 MCG/ACT nasal spray Place 1 spray into both nostrils daily.   Fluticasone Furoate (ARNUITY ELLIPTA) 200 MCG/ACT AEPB Inhale 1 puff into the lungs daily. Use during asthma flares as directed.   fluticasone-salmeterol (ADVAIR) 250-50 MCG/ACT AEPB Inhale 1 puff into the lungs in the morning and at bedtime.   glucose blood test strip Use to test blood glucose twice daily before meals.   magnesium gluconate (MAGONATE) 500 MG tablet Take 500 mg by mouth daily as needed (cramps).   metoprolol succinate (TOPROL-XL) 25 MG 24 hr tablet Take 1 tablet (25 mg total) by mouth 2 (two) times daily.   Multiple Vitamins-Minerals (MULTIVITAL PO) Take 1 tablet by mouth daily.   nitroGLYCERIN (NITROSTAT) 0.4 MG SL tablet Place 1 tablet (0.4 mg total) under the tongue every 5 (five) minutes as needed for up to 25 days for chest pain.   Probiotic Product (PROBIOTIC DAILY PO) Take 1 capsule by mouth at bedtime. Ultra Flora IB   rosuvastatin (CRESTOR) 40 MG tablet Take 1 tablet (40 mg total) by mouth daily.   Tiotropium Bromide Monohydrate (SPIRIVA RESPIMAT) 1.25 MCG/ACT AERS Take 2 Inhalations by mouth daily as needed.   tiZANidine (ZANAFLEX) 4 MG tablet Take 1 tablet (4 mg total) by mouth 3 (three) times daily as needed for muscle spasm   valACYclovir (VALTREX) 500 MG tablet TAKE 1 TABLET BY MOUTH TWICE DAILY AS NEEDED   vitamin C (ASCORBIC ACID) 500 MG tablet Take 500 mg by mouth daily.   Vitamin D-Vitamin K (VITAMIN K2-VITAMIN D3 PO) Take 1 tablet by mouth every other day.   zinc gluconate 50 MG tablet Take 50 mg by mouth every evening.     Allergies:   Cardiolite [technetium-67m], Ciprofloxacin, Clarithromycin,  Erythromycin base, Iodinated contrast media, Montelukast sodium, Niacin, Niaspan [niacin er], Other, Penicillins, Prilosec [omeprazole], Singulair [montelukast], and Ticlid [ticlopidine hcl]   Social History   Socioeconomic History   Marital status: Married    Spouse name: Gery Pray   Number of children: 4   Years of education: 12+   Highest education level: Not on file  Occupational History   Occupation: CARE MANAGEMENT ASST    Employer: [employer]  Tobacco Use   Smoking status: Former   Smokeless tobacco: Never  Substance and Sexual Activity   Alcohol use: Yes    Comment: occasional   Drug use: No   Sexual activity: Not on file  Other Topics Concern   Not on file  Social History Narrative  Lives with her husband. Their children are grown, 1 lives in [place], 1 in [place], [place],  1 in [place], [place], and one in [place], [place].   Right handed   Social Determinants of Health   Financial Resource Strain: Not on file  Food Insecurity: Not on file  Transportation Needs: Not on file  Physical Activity: Not on file  Stress: Not on file  Social Connections: Not on file     Family History: The patient's family history includes Breast cancer (age of onset: 85) in her mother; Diabetes in her father and mother; Heart attack in her paternal uncle; Hyperlipidemia in her father; Ovarian cancer in her cousin; Prostate cancer in her father; Stroke in her mother.  ROS:   Please see the history of present illness.    All other systems reviewed and are negative.  EKGs/Labs/Other Studies Reviewed:    The following studies were reviewed today: Cardiac Studies & Procedures   CARDIAC CATHETERIZATION  CARDIAC CATHETERIZATION 06/11/2021  Narrative   Ost RCA to Mid RCA lesion is 100% stenosed.   Prox Cx lesion is 30% stenosed.   Mid LM to Dist LM lesion is 20% stenosed.   Ost LAD lesion is 20% stenosed.   Origin lesion is 60% stenosed.   LV end diastolic pressure is moderately  elevated.   LV end diastolic pressure is normal.   Hemodynamic findings consistent with moderate pulmonary hypertension.   There is severe aortic valve stenosis.   There is moderate mitral valve stenosis and mild (2+) mitral regurgitation.  Severe aortic stenosis with mean gradient 36 mmHg and calculated aortic valve area 0.91 cm. 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  V wave 47 mmHg.  A simultaneous gradient across the mitral valve was not recorded (however, mean wedge pressure of 28 mmHg minus LVEDP 17 mmHg = 11 mmHg, likely overestimating gradient due to V wave). Total occlusion of RCA. Patent left main Patent LAD Patent circumflex Patent 73 year old SVG to distal RCA with ostial proximal 50 to 70% narrowing.  RECOMMENDATIONS:  Referred to the structural heart team for consideration of TAVR and opinion concerning mitral valve.  Findings Coronary Findings Diagnostic  Dominance: Right  Left Main Mid LM to Dist LM lesion is 20% stenosed.  Left Anterior Descending The vessel exhibits minimal luminal irregularities. Ost LAD lesion is 20% stenosed.  Left Circumflex Prox Cx lesion is 30% stenosed.  Right Coronary Artery The vessel exhibits minimal luminal irregularities. Ost RCA to Mid RCA lesion is 100% stenosed. The lesion was previously treated .  Right Posterior Descending Artery The vessel exhibits minimal luminal irregularities.  Graft To RPDA Origin lesion is 60% stenosed.  Intervention  No interventions have been documented.   STRESS TESTS  NM MYOCAR MULTI W/SPECT W 06/18/2013  Narrative *RADIOLOGY REPORT*  Clinical Data:  Chest pain  MYOCARDIAL IMAGING WITH SPECT (REST AND PHARMACOLOGIC-STRESS - 2 DAY PROTOCOL) GATED LEFT VENTRICULAR WALL MOTION STUDY LEFT VENTRICULAR EJECTION FRACTION  Technique:  Standard myocardial SPECT imaging was performed after intravenous  injection of 30 mCi Tc-59m sestamibi at rest.  On a different day, intravenous infusion of  Lexiscan was performed under supervision of the Cardiology staff.  At peak effect of the drug, 30 mCi Tc-73m sestamibi was injected intravenously and standard myocardial SPECT imaging was performed.  Quantitative gated imaging was also performed to evaluate left ventricular wall motion and estimate left ventricular ejection fraction.  Comparison:  None.  Findings:  Spect:  Allowing for breast attenuation artifact, there are no definite perfusion defects.  Wall motion:  Normal.  Ejection fraction:  59%.  End diastolic volume 91 ml.  End-systolic volume 37 ml.  IMPRESSION: Allowing for breast attenuation artifact, there are no perfusion defects.   Original Report Authenticated By: Jolaine Click, M.D.   ECHOCARDIOGRAM  ECHOCARDIOGRAM COMPLETE 07/08/2022  Narrative ECHOCARDIOGRAM REPORT    Patient Name:   Deanna Simmons Date of Exam: 07/08/2022 Medical Rec #:  [id number]       Height:       63.0 in Accession #:    [id number]      Weight:       223.0 lb Date of Birth:  10-17-1951       BSA:          2.025 m Patient Age:    70 years        BP:           108/60 mmHg Patient Gender: F               HR:           77 bpm. Exam Location:  [address]  Procedure: 2D Echo, Cardiac Doppler and Color Doppler  Indications:    Z95.2 Post TAVR evaluation  History:        Patient has prior history of Echocardiogram examinations, most recent 08/05/2021. CAD, Stroke; Risk Factors:Hypertension, Diabetes, Dyslipidemia and Sleep Apnea. Severe aortic stenosis. Asthma. Lupus.  Sonographer:    Cathie Beams RCS Referring Phys: Janetta Hora  IMPRESSIONS   1. Left ventricular ejection fraction, by estimation, is 60 to 65%. The left ventricle has normal function. The left ventricle has no regional wall motion abnormalities. There is mild asymmetric left ventricular hypertrophy of the  basal-septal segment. Left ventricular diastolic parameters are consistent with Grade I diastolic dysfunction (impaired relaxation). Elevated left ventricular end-diastolic pressure. 2. Right ventricular systolic function is normal. The right ventricular size is normal. 3. Left atrial size was mildly dilated. 4. The mitral valve is normal in structure. Mild mitral valve regurgitation. No evidence of mitral stenosis. Severe mitral annular calcification. 5. TAVR gradients are stable. The aortic valve has been repaired/replaced. Aortic valve regurgitation is not visualized. No aortic stenosis is present. Echo findings are consistent with normal structure and function of the aortic valve prosthesis. Aortic valve mean gradient measures 14.0 mmHg. Aortic valve Vmax measures 2.56 m/s. 6. The inferior vena cava is normal in size with greater than 50% respiratory variability, suggesting right atrial pressure of 3 mmHg.  FINDINGS Left Ventricle: Left ventricular ejection fraction, by estimation, is 60 to 65%. The left ventricle has normal function. The left ventricle has no regional wall motion abnormalities. The left ventricular internal cavity size was normal in size. There is mild asymmetric left ventricular hypertrophy of the basal-septal segment. Left ventricular diastolic parameters are consistent with Grade I diastolic dysfunction (impaired relaxation). Elevated left ventricular end-diastolic pressure.  Right Ventricle: The right ventricular size is normal. No increase in right ventricular wall thickness. Right ventricular systolic function is normal.  Left Atrium: Left atrial size was mildly dilated.  Right Atrium: Right atrial size was normal in size.  Pericardium: There is no evidence of pericardial effusion.  Mitral Valve: The mitral valve is normal in structure. Severe mitral annular calcification. Mild mitral valve regurgitation. No evidence of mitral valve stenosis.  Tricuspid Valve: The  tricuspid valve is normal in structure. Tricuspid valve regurgitation is  trivial. No evidence of tricuspid stenosis.  Aortic Valve: TAVR gradients are stable. The aortic valve has been repaired/replaced. Aortic valve regurgitation is not visualized. No aortic stenosis is present. Aortic valve mean gradient measures 14.0 mmHg. Aortic valve peak gradient measures 26.2 mmHg. There is a 23 mm Sapien prosthetic, stented (TAVR) valve present in the aortic position. Echo findings are consistent with normal structure and function of the aortic valve prosthesis.  Pulmonic Valve: The pulmonic valve was normal in structure. Pulmonic valve regurgitation is not visualized. No evidence of pulmonic stenosis.  Aorta: The aortic root is normal in size and structure.  Venous: The inferior vena cava is normal in size with greater than 50% respiratory variability, suggesting right atrial pressure of 3 mmHg.  IAS/Shunts: No atrial level shunt detected by color flow Doppler.   LEFT VENTRICLE PLAX 2D LVIDd:         3.40 cm Diastology LVIDs:         2.10 cm LV e' medial:    6.09 cm/s LV PW:         0.80 cm LV E/e' medial:  22.2 LV IVS:        1.20 cm LV e' lateral:   11.40 cm/s LV E/e' lateral: 11.8   RIGHT VENTRICLE RV Basal diam:  2.90 cm RV S prime:     7.83 cm/s TAPSE (M-mode): 1.4 cm  LEFT ATRIUM             Index        RIGHT ATRIUM           Index LA diam:        3.70 cm 1.83 cm/m   RA Area:     14.70 cm LA Vol (A2C):   75.9 ml 37.48 ml/m  RA Volume:   37.10 ml  18.32 ml/m LA Vol (A4C):   47.9 ml 23.65 ml/m LA Biplane Vol: 63.9 ml 31.55 ml/m AORTIC VALVE AV Vmax:           256.00 cm/s AV Vmean:          174.000 cm/s AV VTI:            0.566 m AV Peak Grad:      26.2 mmHg AV Mean Grad:      14.0 mmHg LVOT Vmax:         149.00 cm/s LVOT Vmean:        105.000 cm/s LVOT VTI:          0.355 m LVOT/AV VTI ratio: 0.63  AORTA Ao Asc diam: 2.70 cm  MITRAL VALVE                TRICUSPID  VALVE MV Area (PHT): 2.85 cm     TR Peak grad:   17.8 mmHg MV Decel Time: 266 msec     TR Vmax:        211.00 cm/s MV E velocity: 135.00 cm/s MV A velocity: 172.00 cm/s  SHUNTS MV E/A ratio:  0.78         Systemic VTI: 0.36 m  Chilton Si MD Electronically signed by Chilton Si MD Signature Date/Time: 07/08/2022/11:21:35 PM    Final    MONITORS  LONG TERM MONITOR-LIVE TELEMETRY (3-14 DAYS) 05/25/2021  Narrative  Basic rhythm is NSR  Non sustained SVT < 10 beats. 4 instances identified.  No atrial fibrillation.  Mobitz 1 second degree AV block transient during sleep.  Rare PAC's and PVC's with burden < 1% each.  Patch Wear Time:  11 days and 1 hours (2022-07-06T20:47:56-0400 to 2022-07-17T22:22:54-0400)  Patient had a min HR of 43 bpm, max HR of 176 bpm, and avg HR of 76 bpm. Predominant underlying rhythm was Sinus Rhythm. 4 Supraventricular Tachycardia runs occurred, the run with the fastest interval lasting 10 beats with a max rate of 176 bpm, the longest lasting 8 beats with an avg rate of 135 bpm. Second Degree AV Block-Mobitz I (Wenckebach) was present. Isolated SVEs were rare (<1.0%), SVE Couplets were rare (<1.0%), and SVE Triplets were rare (<1.0%). Isolated VEs were rare (<1.0%), VE Couplets were rare (<1.0%), and no VE Triplets were present. Ventricular Bigeminy was present.   CT SCANS  CT CORONARY MORPH W/CTA COR W/SCORE 06/30/2021  Addendum 06/30/2021  7:31 PM ADDENDUM REPORT: 06/30/2021 19:29  CLINICAL DATA:  Severe Aortic Stenosis.  EXAM: Cardiac TAVR CT  TECHNIQUE: A non-contrast, gated CT scan was obtained with axial slices of 3 mm through the heart for aortic valve calcium scoring. A 120 kV retrospective, gated, contrast cardiac scan was obtained. Gantry rotation speed was 250 msecs and collimation was 0.6 mm. Nitroglycerin was not given. The 3D data set was reconstructed in 5% intervals of the 0-95% of the R-R cycle. Systolic and  diastolic phases were analyzed on a dedicated workstation using MPR, MIP, and VRT modes. The patient received 100 cc of contrast.  FINDINGS: Image quality: Average.  PVC noted during the exam.  Noise artifact is: Limited.  Valve Morphology: The aortic valve is tricuspid with severe calcifications and restricted movement in systole. The calcifications appear diffuse.  Aortic Valve Calcium score: 1696  Aortic annular dimension:  Phase assessed: 15%  Annular area: 409 mm2  Annular perimeter: 72.6 mm  Max diameter: 24.7 mm  Min diameter: 21.3 mm  Annular and subannular calcification: None.  Optimal coplanar projection: LAO 3 CRA 22  Coronary Artery Height above Annulus:  Left Main: 17.7 mm  Right Coronary: 16.1 mm  Sinus of Valsalva Measurements:  Non-coronary: 27.5 mm  Right-coronary: 24.9 mm  Left-coronary: 28.1 mm  Average diameter: 27.0 mm  Sinus of Valsalva Height:  Non-coronary: 19.3 mm  Right-coronary: 24.0 mm  Left-coronary: 24.5 mm  Sinotubular Junction: 28 mm with a single severe protruding calcification noted.  Ascending Thoracic Aorta: 35 mm.  Coronary Arteries: Normal coronary origin. Right dominance. The study was performed without use of NTG and is insufficient for plaque evaluation. Please refer to recent cardiac catheterization for coronary assessment. 3-vessel coronary calcifications noted. SVG-RCA noted.  Cardiac Morphology:  Right Atrium: Right atrial size is within normal limits.  Right Ventricle: The right ventricular cavity is within normal limits.  Left Atrium: Left atrial size is normal in size with no left atrial appendage filling defect.  Left Ventricle: The ventricular cavity size is within normal limits. There are no stigmata of prior infarction. There is no abnormal filling defect.  Pulmonary arteries: Normal in size without proximal filling defect.  Pulmonary veins: Normal pulmonary venous  drainage.  Pericardium: Normal thickness with no significant effusion or calcium present.  Mitral Valve: The mitral valve is degenerative with moderate to severe annular calcium.  Extra-cardiac findings: See attached radiology report for non-cardiac structures.  IMPRESSION: 1. Tricuspid aortic valve with diffuse severe calcifications. Severe aortic stenosis with calcium score 1696.  2. Difficult study with PVC noted during systole but annular measurements support a 23 mm Edwards Sapien 3 (409 mm2).  3. Severe protruding calcification noted at the STJ which should be considered  for self expanding valve.  4. No significant annular or subannular calcifications.  5. Sufficient coronary to annulus distance.  6. Optimal Fluoroscopic Angle for Delivery: LAO 3 CRA 22  [place] T. Flora Lipps, MD   Electronically Signed By: Lennie Odor M.D. On: 06/30/2021 19:29  Narrative EXAM: OVER-READ INTERPRETATION  CT CHEST  The following report is an over-read performed by radiologist Dr. Trudie Reed of [facility] on 06/29/2021. This over-read does not include interpretation of cardiac or coronary anatomy or pathology. The coronary calcium score/coronary CTA interpretation by the cardiologist is attached.  COMPARISON:  None.  FINDINGS: Extracardiac findings will be described separately under dictation for contemporaneously obtained CTA chest, abdomen and pelvis.  IMPRESSION: Please see separate dictation for contemporaneously obtained CTA chest, abdomen and pelvis dated 06/29/2021 for full description of relevant extracardiac findings.  Electronically Signed: By: Trudie Reed M.D. On: 06/29/2021 11:47           EKG:  EKG is  ordered today.  The ekg ordered today demonstrates normal sinus rhythm 73 bpm, within normal limits.  Recent Labs: No results found for requested labs within last 365 days.  Recent Lipid Panel    Component Value Date/Time   CHOL  120 03/11/2014 0841   TRIG 65.0 03/11/2014 0841   HDL 41.00 03/11/2014 0841   CHOLHDL 3 03/11/2014 0841   VLDL 13.0 03/11/2014 0841   LDLCALC 66 03/11/2014 0841     Risk Assessment/Calculations:                Physical Exam:    VS:  BP 113/71 (BP Location: Left Arm, Patient Position: Sitting, Cuff Size: Normal)   Pulse 73   Ht 5\' 4"  (1.626 m)   Wt 216 lb (98 kg)   BMI 37.08 kg/m     Wt Readings from Last 3 Encounters:  03/21/23 216 lb (98 kg)  03/07/23 216 lb 9.6 oz (98.2 kg)  12/27/22 220 lb 8 oz (100 kg)     GEN:  Well nourished, well developed in no acute distress HEENT: Normal NECK: No JVD; No carotid bruits LYMPHATICS: No lymphadenopathy CARDIAC: RRR, 2/6 early peaking systolic ejection murmur at the right upper sternal border, no diastolic murmur RESPIRATORY:  Clear to auscultation without rales, wheezing or rhonchi  ABDOMEN: Soft, non-tender, non-distended MUSCULOSKELETAL:  No edema; No deformity  SKIN: Warm and dry NEUROLOGIC:  Alert and oriented x 3 PSYCHIATRIC:  Normal affect   ASSESSMENT:    1. S/P TAVR (transcatheter aortic valve replacement)   2. Coronary artery disease of bypass graft of native heart with stable angina pectoris (HCC)   3. Essential hypertension   4. Mixed hyperlipidemia   5. Type 2 diabetes mellitus with other circulatory complication, without long-term current use of insulin (HCC)   6. Aortic valve disorder    PLAN:    In order of problems listed above:  The patient is doing well with New York Heart Association functional class II symptoms of exertional dyspnea and normal TAVR valve function on her most recent echocardiogram.  This is personally reviewed today.  I would like her to follow-up in 6 months with an APP and an echocardiogram prior to that visit for annual surveillance of her TAVR prosthesis.  Her initial postoperative day #1 mean gradients were modestly elevated at 20 mmHg likely due to some degree of patient  prosthesis mismatch.  However, her follow-up echoes have showed lower gradients and normal prosthetic aortic valve function. No anginal symptoms.  Continue current management.  On clopidogrel for antiplatelet therapy. Blood pressure is very well-controlled on metoprolol succinate Treated with rosuvastatin and ezetimibe.  Last labs with a cholesterol 166, HDL 51, LDL 98.  Will discuss with her whether she would consider a lipid clinic referral for consideration of a PCSK9 inhibitor. Patient is on [place].  Managed by her PCP.           Medication Adjustments/Labs and Tests Ordered: Current medicines are reviewed at length with the patient today.  Concerns regarding medicines are outlined above.  Orders Placed This Encounter  Procedures   EKG 12-Lead   ECHOCARDIOGRAM COMPLETE   No orders of the defined types were placed in this encounter.   Patient Instructions  Medication Instructions:  Your physician recommends that you continue on your current medications as directed. Please refer to the Current Medication list given to you today.  *If you need a refill on your cardiac medications before your next appointment, please call your pharmacy*   Lab Work: none If you have labs (blood work) drawn today and your tests are completely normal, you will receive your results only by: MyChart Message (if you have MyChart) OR A paper copy in the mail If you have any lab test that is abnormal or we need to change your treatment, we will call you to review the results.   Testing/Procedures: Your physician has requested that you have an echocardiogram. Echocardiography is a painless test that uses sound waves to create images of your heart. It provides your doctor with information about the size and shape of your heart and how well your heart's chambers and valves are working. This procedure takes approximately one hour. There are no restrictions for this procedure. Please do NOT wear cologne,  perfume, aftershave, or lotions (deodorant is allowed). Please arrive 15 minutes prior to your appointment time. To be done in 6 months--week or so prior to visit with APP   Follow-Up: At [facility], you and your health needs are our priority.  As part of our continuing mission to provide you with exceptional heart care, we have created designated Provider Care Teams.  These Care Teams include your primary Cardiologist (physician) and Advanced Practice Providers (APPs -  Physician Assistants and Nurse Practitioners) who all work together to provide you with the care you need, when you need it.  We recommend signing up for the patient portal called "MyChart".  Sign up information is provided on this After Visit Summary.  MyChart is used to connect with patients for Virtual Visits (Telemedicine).  Patients are able to view lab/test results, encounter notes, upcoming appointments, etc.  Non-urgent messages can be sent to your provider as well.   To learn more about what you can do with MyChart, go to [email or web address].    Your next appointment:   6 month(s)  Provider:   Jari Favre, PA-C, Ronie Spies, PA-C, Robin Searing, NP, Jacolyn Reedy, PA-C, Eligha Bridegroom, NP, or Tereso Newcomer, PA-C     Then, Lesleigh Noe, MD (Inactive) will plan to see you again in 12 month(s).    Other Instructions      Signed, Tonny Bollman, MD  03/21/2023 11:46 AM    [facility]

## 2023-03-23 DIAGNOSIS — M25561 Pain in right knee: Secondary | ICD-10-CM | POA: Diagnosis not present

## 2023-03-29 DIAGNOSIS — I129 Hypertensive chronic kidney disease with stage 1 through stage 4 chronic kidney disease, or unspecified chronic kidney disease: Secondary | ICD-10-CM | POA: Diagnosis not present

## 2023-03-29 DIAGNOSIS — M3214 Glomerular disease in systemic lupus erythematosus: Secondary | ICD-10-CM | POA: Diagnosis not present

## 2023-03-29 DIAGNOSIS — D631 Anemia in chronic kidney disease: Secondary | ICD-10-CM | POA: Diagnosis not present

## 2023-03-29 DIAGNOSIS — E119 Type 2 diabetes mellitus without complications: Secondary | ICD-10-CM | POA: Diagnosis not present

## 2023-03-29 DIAGNOSIS — N189 Chronic kidney disease, unspecified: Secondary | ICD-10-CM | POA: Diagnosis not present

## 2023-03-29 DIAGNOSIS — N181 Chronic kidney disease, stage 1: Secondary | ICD-10-CM | POA: Diagnosis not present

## 2023-03-30 DIAGNOSIS — M1711 Unilateral primary osteoarthritis, right knee: Secondary | ICD-10-CM | POA: Diagnosis not present

## 2023-03-31 ENCOUNTER — Other Ambulatory Visit: Payer: Self-pay | Admitting: Family Medicine

## 2023-03-31 ENCOUNTER — Ambulatory Visit: Payer: Self-pay

## 2023-03-31 DIAGNOSIS — R0781 Pleurodynia: Secondary | ICD-10-CM

## 2023-04-06 ENCOUNTER — Other Ambulatory Visit (HOSPITAL_COMMUNITY): Payer: Self-pay

## 2023-04-06 ENCOUNTER — Telehealth: Payer: Self-pay

## 2023-04-06 NOTE — Telephone Encounter (Signed)
Called patient - DOB/DPR - NEED UPDATED DPR - LMOVM to contact office regarding FMLA paperwork received from [employer] via fax.  When patient call back - please advise of $25 fee and 5-7 business days for completion of forms.  Forms have been placed in Pending basket.

## 2023-04-07 ENCOUNTER — Other Ambulatory Visit (HOSPITAL_COMMUNITY): Payer: Self-pay

## 2023-04-10 ENCOUNTER — Other Ambulatory Visit (HOSPITAL_COMMUNITY): Payer: Self-pay

## 2023-04-10 DIAGNOSIS — U071 COVID-19: Secondary | ICD-10-CM | POA: Diagnosis not present

## 2023-04-10 MED ORDER — LAGEVRIO 200 MG PO CAPS
ORAL_CAPSULE | ORAL | 0 refills | Status: DC
  Filled 2023-04-10: qty 40, 5d supply, fill #0

## 2023-04-11 ENCOUNTER — Telehealth: Payer: Self-pay | Admitting: Allergy and Immunology

## 2023-04-11 NOTE — Telephone Encounter (Signed)
Called patient - DOB verified - stated she was Dx Covid yesterday, 04/10/23, wanted to review her medications.  Reviewed patient medications with her - making sure she follows provider directions as prescribed.  Patient verbalized understanding, no further questions.

## 2023-04-11 NOTE — Telephone Encounter (Signed)
Completed FMLA forms have been given to provider to review, complete and sign.

## 2023-04-11 NOTE — Telephone Encounter (Signed)
Patient is requesting a call back to discuss her medications. Did not provide additional information.  Best contact number: [phone]

## 2023-04-11 NOTE — Telephone Encounter (Signed)
Patient called in - DOB verified - advised of notation below.  Patient verbalized understanding, stated she will/can pay $25 at anytime.  Patient advised message will be forwarded to front staff to collect fee.  Patient verbalized understanding, no further questions.

## 2023-04-13 ENCOUNTER — Ambulatory Visit: Payer: Commercial Managed Care - PPO

## 2023-04-17 ENCOUNTER — Other Ambulatory Visit (HOSPITAL_COMMUNITY): Payer: Self-pay

## 2023-04-17 ENCOUNTER — Telehealth: Payer: Self-pay | Admitting: Allergy and Immunology

## 2023-04-17 DIAGNOSIS — J309 Allergic rhinitis, unspecified: Secondary | ICD-10-CM

## 2023-04-17 MED ORDER — FLUCONAZOLE 150 MG PO TABS
150.0000 mg | ORAL_TABLET | ORAL | 0 refills | Status: DC
  Filled 2023-04-17: qty 2, 4d supply, fill #0

## 2023-04-17 NOTE — Telephone Encounter (Signed)
Patient called and made $25 payment for FMLA paperwork.

## 2023-04-17 NOTE — Telephone Encounter (Signed)
FMLA paper have been faxed to [employer].  Called patient -NO DPR on file- LMOVM to contact office.  When patient call back - please advise of above above notation and copy of FMLA paper work is on [address] side ready for pick up.

## 2023-04-19 ENCOUNTER — Other Ambulatory Visit (HOSPITAL_COMMUNITY): Payer: Self-pay

## 2023-04-24 ENCOUNTER — Other Ambulatory Visit (HOSPITAL_COMMUNITY): Payer: Self-pay

## 2023-04-24 MED ORDER — TERCONAZOLE 0.8 % VA CREA
1.0000 | TOPICAL_CREAM | Freq: Every day | VAGINAL | 0 refills | Status: AC
  Filled 2023-04-24: qty 20, 5d supply, fill #0

## 2023-04-26 ENCOUNTER — Other Ambulatory Visit (HOSPITAL_BASED_OUTPATIENT_CLINIC_OR_DEPARTMENT_OTHER): Payer: Self-pay

## 2023-04-26 ENCOUNTER — Other Ambulatory Visit (HOSPITAL_COMMUNITY): Payer: Self-pay

## 2023-04-26 DIAGNOSIS — B372 Candidiasis of skin and nail: Secondary | ICD-10-CM | POA: Diagnosis not present

## 2023-04-26 DIAGNOSIS — B3731 Acute candidiasis of vulva and vagina: Secondary | ICD-10-CM | POA: Diagnosis not present

## 2023-04-26 MED ORDER — FLUCONAZOLE 150 MG PO TABS
150.0000 mg | ORAL_TABLET | ORAL | 0 refills | Status: DC
  Filled 2023-04-26: qty 2, 3d supply, fill #0

## 2023-04-26 MED ORDER — KETOCONAZOLE 2 % EX CREA
TOPICAL_CREAM | CUTANEOUS | 1 refills | Status: DC
  Filled 2023-04-26: qty 30, 14d supply, fill #0
  Filled 2023-10-17: qty 30, 14d supply, fill #1

## 2023-04-29 ENCOUNTER — Other Ambulatory Visit (HOSPITAL_COMMUNITY): Payer: Self-pay

## 2023-05-01 ENCOUNTER — Other Ambulatory Visit (HOSPITAL_COMMUNITY): Payer: Self-pay

## 2023-05-01 MED ORDER — DAPAGLIFLOZIN PROPANEDIOL 10 MG PO TABS
10.0000 mg | ORAL_TABLET | Freq: Every day | ORAL | 3 refills | Status: AC
  Filled 2023-05-01 – 2023-05-03 (×2): qty 90, 90d supply, fill #0
  Filled 2023-08-03: qty 90, 90d supply, fill #1
  Filled 2023-10-17: qty 90, 90d supply, fill #2
  Filled 2024-02-05 – 2024-02-06 (×2): qty 90, 90d supply, fill #3

## 2023-05-03 ENCOUNTER — Other Ambulatory Visit (HOSPITAL_COMMUNITY): Payer: Self-pay

## 2023-05-03 DIAGNOSIS — N764 Abscess of vulva: Secondary | ICD-10-CM | POA: Diagnosis not present

## 2023-05-03 DIAGNOSIS — E119 Type 2 diabetes mellitus without complications: Secondary | ICD-10-CM | POA: Diagnosis not present

## 2023-05-03 DIAGNOSIS — L304 Erythema intertrigo: Secondary | ICD-10-CM | POA: Diagnosis not present

## 2023-05-03 DIAGNOSIS — I2510 Atherosclerotic heart disease of native coronary artery without angina pectoris: Secondary | ICD-10-CM | POA: Diagnosis not present

## 2023-05-03 MED ORDER — SULFAMETHOXAZOLE-TRIMETHOPRIM 400-80 MG PO TABS
1.0000 | ORAL_TABLET | Freq: Two times a day (BID) | ORAL | 0 refills | Status: AC
  Filled 2023-05-03: qty 14, 7d supply, fill #0

## 2023-05-03 MED ORDER — NYSTATIN 100000 UNIT/GM EX POWD
CUTANEOUS | 1 refills | Status: AC
  Filled 2023-05-03: qty 30, 7d supply, fill #0

## 2023-05-05 ENCOUNTER — Ambulatory Visit: Payer: Commercial Managed Care - PPO | Attending: Internal Medicine | Admitting: Pharmacist

## 2023-05-05 ENCOUNTER — Encounter: Payer: Self-pay | Admitting: Pharmacist

## 2023-05-05 ENCOUNTER — Other Ambulatory Visit (HOSPITAL_COMMUNITY): Payer: Self-pay

## 2023-05-05 DIAGNOSIS — E7800 Pure hypercholesterolemia, unspecified: Secondary | ICD-10-CM | POA: Diagnosis not present

## 2023-05-05 MED ORDER — METOPROLOL SUCCINATE ER 25 MG PO TB24
25.0000 mg | ORAL_TABLET | Freq: Every day | ORAL | 3 refills | Status: DC
  Filled 2023-05-05: qty 90, 90d supply, fill #0
  Filled 2023-08-03: qty 90, 90d supply, fill #1
  Filled 2023-10-17: qty 90, 90d supply, fill #2
  Filled 2024-03-02: qty 90, 90d supply, fill #3

## 2023-05-05 MED ORDER — ROSUVASTATIN CALCIUM 40 MG PO TABS
40.0000 mg | ORAL_TABLET | Freq: Every day | ORAL | 3 refills | Status: DC
  Filled 2023-05-05: qty 90, 90d supply, fill #0
  Filled 2023-08-03: qty 90, 90d supply, fill #1
  Filled 2023-10-17: qty 90, 90d supply, fill #2
  Filled 2024-02-05: qty 90, 90d supply, fill #3

## 2023-05-05 NOTE — Progress Notes (Signed)
Patient ID: Deanna Simmons                 DOB: 1950/11/23                    MRN: [id number]     HPI: Deanna Simmons is a 72 y.o. female patient referred to lipid clinic by Dr Excell Seltzer. PMH is significant for CAD s/p CABG in 2004, aortic stenosis s/p TAVR 07/2021, DM, CVA, HTN, lupus, and obesity. LDL remains elevated on high intensity statin therapy + ezetimibe and pt referred to lipid clinic to discuss additional options.  Pt reports tolerating her rosuvastatin and ezetimibe well. Has questions about her metoprolol. Previously took metoprolol tartrate, then was on succinate. Our med list shows succinate 25mg  BID. Pt reports her nephrologist in April decreased her dose to 1/2 tab BID. Sh saw Dr Excell Seltzer in May but didn't mention dose change. Reports she's been taking lower dose. Advised that she can take 1 tab daily instead of 1/2 tab BID since she's on the extended release formulation.  Current Medications: rosuvastatin 40mg  daily, ezetimibe 10mg  daily Intolerances: niacin - itching and flushing Risk Factors: premature CAD, CVA, DM, HTN, obesity, age LDL goal: 55mg /dL  Exercise: Limited due to left knee problems, pending knee surgery  Family History: Breast cancer (age of onset: 70) in her mother; Diabetes in her father and mother; Heart attack in her paternal uncle; Hyperlipidemia in her father; Ovarian cancer in her cousin; Prostate cancer in her father; Stroke in her mother.   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 Occasional alcohol use, former tobacco use, no drug use.  Labs: 12/14/22: TC 166, TG 92, HDL 51, LDL 98, nonHDL 115 - rosuvastatin 40mg  daily, ezetimibe 10mg  daily  Past Medical History:  Diagnosis Date   Abnormal finding on Pap smear, ASCUS    Anemia    Asthma    Chondromalacia of knee    Coronary artery disease    Diabetes mellitus without complication (HCC)    Diverticulum 12/11/2002   in cecum   Fibroids    h/o   GERD (gastroesophageal reflux disease)    H/O tinea  cruris 02/28/2010   History of CVA (cerebrovascular accident)    Hypercholesteremia    Hypertension    Obesity    Pneumonia 07/01/2008   S/P TAVR (transcatheter aortic valve replacement) 07/13/2021   s/p TAVR w/ a 23mm Edwards S3U via the TF approach by Dr. Excell Seltzer and Dr. Laneta Simmers.   Synovitis of knee    Systemic lupus erythematosus (HCC)    with nephritis   Vulvitis    h/o    Current Outpatient Medications on File Prior to Visit  Medication Sig Dispense Refill   albuterol (VENTOLIN HFA) 108 (90 Base) MCG/ACT inhaler Inhale 2 puffs into the lungs every 4 to 6 hours as needed for wheezing, coughing or shortness of breath. 6.7 g 1   azithromycin (ZITHROMAX) 500 MG tablet Take one tablet by mouth1 hour before any dental work including cleanings. 6 tablet 12   cetirizine (ZYRTEC) 10 MG tablet Take 1 tablet (10 mg total) by mouth daily as needed for allergies (Can use an extra dose during flares). 60 tablet 5   clopidogrel (PLAVIX) 75 MG tablet Take 1 tablet (75 mg total) by mouth daily. 90 tablet 2   dapagliflozin propanediol (FARXIGA) 10 MG TABS tablet Take 1 tablet by mouth daily 90 tablet 3   Estradiol 10 MCG TABS vaginal tablet Place 1 tablet (10  mcg total) vaginally 2 (two) times a week as directed. 8 tablet 11   ezetimibe (ZETIA) 10 MG tablet Take 1 tablet (10 mg total) by mouth daily. 90 tablet 2   famotidine (PEPCID) 40 MG tablet Take 1 tablet (40 mg total) by mouth in the morning. 90 tablet 1   ferrous sulfate 325 (65 FE) MG tablet Take 325 mg by mouth daily.     fluconazole (DIFLUCAN) 150 MG tablet Take 1 tablet by mouth today and if symptoms continue take another tablet in 3 days 2 tablet 0   fluconazole (DIFLUCAN) 150 MG tablet Take 1 tablet (150 mg total) by mouth once and if symptoms continue take another tablet in 3 days. 2 tablet 0   fluticasone (FLONASE) 50 MCG/ACT nasal spray Place 1 spray into both nostrils daily. 48 g 1   Fluticasone Furoate (ARNUITY ELLIPTA) 200 MCG/ACT  AEPB Inhale 1 puff into the lungs daily. Use during asthma flares as directed. 60 each 1   fluticasone-salmeterol (ADVAIR) 250-50 MCG/ACT AEPB Inhale 1 puff into the lungs in the morning and at bedtime. 60 each 5   glucose blood test strip Use to test blood glucose twice daily before meals. 200 strip 3   ketoconazole (NIZORAL) 2 % cream Apply to affected area twice a day for skin fungus infection for 14 days 30 g 1   magnesium gluconate (MAGONATE) 500 MG tablet Take 500 mg by mouth daily as needed (cramps).     metoprolol succinate (TOPROL-XL) 25 MG 24 hr tablet Take 1 tablet (25 mg total) by mouth 2 (two) times daily. 180 tablet 3   molnupiravir EUA (LAGEVRIO) 200 MG CAPS capsule Take 4 capsules by mouth every 12 hours for 5 days 40 capsule 0   Multiple Vitamins-Minerals (MULTIVITAL PO) Take 1 tablet by mouth daily.     nitroGLYCERIN (NITROSTAT) 0.4 MG SL tablet Place 1 tablet (0.4 mg total) under the tongue every 5 (five) minutes as needed for up to 25 days for chest pain. 25 tablet 3   nystatin (MYCOSTATIN/NYSTOP) powder Apply to affected areas 2 times a day as needed for 7 days 30 g 1   Probiotic Product (PROBIOTIC DAILY PO) Take 1 capsule by mouth at bedtime. Ultra Flora IB     rosuvastatin (CRESTOR) 40 MG tablet Take 1 tablet (40 mg total) by mouth daily. 90 tablet 2   sulfamethoxazole-trimethoprim (BACTRIM) 400-80 MG tablet Take 1 tablet by mouth 2 (two) times daily for 7 days. 14 tablet 0   Tiotropium Bromide Monohydrate (SPIRIVA RESPIMAT) 1.25 MCG/ACT AERS Take 2 Inhalations by mouth daily as needed. 4 g 5   tiZANidine (ZANAFLEX) 4 MG tablet Take 1 tablet (4 mg total) by mouth 3 (three) times daily as needed for muscle spasm 30 tablet 0   valACYclovir (VALTREX) 500 MG tablet TAKE 1 TABLET BY MOUTH TWICE DAILY AS NEEDED 60 tablet 3   vitamin C (ASCORBIC ACID) 500 MG tablet Take 500 mg by mouth daily.     Vitamin D-Vitamin K (VITAMIN K2-VITAMIN D3 PO) Take 1 tablet by mouth every other day.      zinc gluconate 50 MG tablet Take 50 mg by mouth every evening.     [DISCONTINUED] metoprolol tartrate (LOPRESSOR) 25 MG tablet Take 1 tablet (25 mg total) by mouth 2 (two) times daily with a meal. 180 tablet 3   No current facility-administered medications on file prior to visit.    Allergies  Allergen Reactions   Cardiolite [Technetium-51m] Itching  Ciprofloxacin Itching   Clarithromycin Itching   Erythromycin Base Rash   Iodinated Contrast Media Itching    Pt has a potential contrast allergy; She states in 2012 her cardiologist told her she had a reaction during the "study". We are unable to determine the study or confirm a reaction. The patient will have a 13 hr prep for future studies. Molecular Imaging states their injection does not cause any reactions.   Montelukast Sodium Itching   Niacin Itching   Niaspan [Niacin Er] Other (See Comments)    flushing   Other Itching    Chemical Use And Stress Cardiolite Testing (Uncoded)   Penicillins Itching, Hives and Rash    Reaction: over 10 years   Prilosec [Omeprazole] Itching   Singulair [Montelukast] Hives and Itching   Ticlid [Ticlopidine Hcl] Itching    Assessment/Plan:  1. Hyperlipidemia - LDL 98 on rosuvastatin 40mg  daily and ezetimibe 10mg  daily, above goal < 55 due to history of premature CAD, CVA, and DM. Discussed expected LDL lowering, injection technique, and side effect profile of Repatha which would most effectively bring her LDL to goal. She is agreeable to start med. Will qualify for $5 copay card that she'll need to call in to activate since she's > 65. Will continue rosuvastatin and stop ezetimibe when she starts Repatha. Ideally statin and PCSK9i should bring her LDL to goal while decreasing pill burden. Recheck fasting lipids in 3 months.  Amayrany Cafaro E. Bhavik Cabiness, PharmD, BCACP, CPP [facility] [address], [place], [place] [address] Phone: [phone]; Fax: [phone] 05/05/2023 4:04 PM

## 2023-05-05 NOTE — Patient Instructions (Addendum)
Your LDL cholesterol is 98 and your goal is <55  I will submit information to your insurance for Repatha and let you know when I hear back.    Repatha is a subcutaneous injection given once every 2 weeks in the fatty tissue of your stomach or upper outer thigh. Store the medication in the fridge. You can let your dose warm up to room temperature for 30 minutes before injecting if you prefer. Repatha will lower your LDL cholesterol by 60% and helps to lower your chance of having a heart attack or stroke.  Continue on your rosuvastatin. We will be able to stop your ezetimibe (Zetia) when you start Repatha  Recheck fasting cholesterol on Tuesday September 3 any time after 7:30am  You can take your metoprolol succinate 25mg  1 tablet daily. Your blood pressure goal is < 130/80

## 2023-05-08 ENCOUNTER — Telehealth: Payer: Self-pay | Admitting: Pharmacist

## 2023-05-08 ENCOUNTER — Encounter: Payer: Self-pay | Admitting: Pharmacist

## 2023-05-08 ENCOUNTER — Other Ambulatory Visit (HOSPITAL_COMMUNITY): Payer: Self-pay

## 2023-05-08 MED ORDER — REPATHA SURECLICK 140 MG/ML ~~LOC~~ SOAJ
140.0000 mg | SUBCUTANEOUS | 3 refills | Status: DC
  Filled 2023-05-08: qty 2, 28d supply, fill #0
  Filled 2023-05-30: qty 2, 28d supply, fill #1
  Filled 2023-06-24: qty 2, 28d supply, fill #2
  Filled 2023-07-27: qty 2, 28d supply, fill #3
  Filled 2023-08-23: qty 2, 28d supply, fill #4
  Filled 2023-09-21: qty 2, 28d supply, fill #5
  Filled 2023-10-17: qty 2, 28d supply, fill #6
  Filled 2023-11-17: qty 2, 28d supply, fill #7
  Filled 2023-12-09: qty 2, 28d supply, fill #8
  Filled 2024-01-12 – 2024-01-16 (×3): qty 2, 28d supply, fill #9
  Filled 2024-02-10 – 2024-02-16 (×2): qty 2, 28d supply, fill #10
  Filled 2024-03-02 – 2024-03-08 (×2): qty 2, 28d supply, fill #11

## 2023-05-08 NOTE — Telephone Encounter (Signed)
Repatha prior auth approved through 05/04/24. Rx sent to pharmacy, pt notified via mychart and provided # to call to get set up with copay card. F/u labs already scheduled.

## 2023-05-11 ENCOUNTER — Other Ambulatory Visit (HOSPITAL_COMMUNITY): Payer: Self-pay

## 2023-05-19 ENCOUNTER — Other Ambulatory Visit (HOSPITAL_COMMUNITY): Payer: Self-pay

## 2023-05-19 MED ORDER — NYSTATIN 100000 UNIT/GM EX POWD
CUTANEOUS | 1 refills | Status: AC
  Filled 2023-05-19: qty 30, 7d supply, fill #0
  Filled 2023-09-21: qty 30, 7d supply, fill #1

## 2023-05-29 ENCOUNTER — Other Ambulatory Visit (HOSPITAL_COMMUNITY): Payer: Self-pay

## 2023-05-30 ENCOUNTER — Other Ambulatory Visit (HOSPITAL_COMMUNITY): Payer: Self-pay

## 2023-05-30 DIAGNOSIS — L700 Acne vulgaris: Secondary | ICD-10-CM | POA: Diagnosis not present

## 2023-05-30 DIAGNOSIS — L304 Erythema intertrigo: Secondary | ICD-10-CM | POA: Diagnosis not present

## 2023-05-30 MED ORDER — TRIAMCINOLONE ACETONIDE 0.1 % EX OINT
TOPICAL_OINTMENT | CUTANEOUS | 0 refills | Status: AC
  Filled 2023-05-30: qty 60, 12d supply, fill #0
  Filled 2023-05-30: qty 60, 30d supply, fill #0

## 2023-05-31 ENCOUNTER — Other Ambulatory Visit (HOSPITAL_COMMUNITY): Payer: Self-pay

## 2023-06-01 DIAGNOSIS — H524 Presbyopia: Secondary | ICD-10-CM | POA: Diagnosis not present

## 2023-06-01 DIAGNOSIS — H349 Unspecified retinal vascular occlusion: Secondary | ICD-10-CM | POA: Diagnosis not present

## 2023-06-01 DIAGNOSIS — Z961 Presence of intraocular lens: Secondary | ICD-10-CM | POA: Diagnosis not present

## 2023-06-01 DIAGNOSIS — H26492 Other secondary cataract, left eye: Secondary | ICD-10-CM | POA: Diagnosis not present

## 2023-06-03 ENCOUNTER — Other Ambulatory Visit (HOSPITAL_COMMUNITY): Payer: Self-pay

## 2023-06-05 ENCOUNTER — Other Ambulatory Visit (HOSPITAL_COMMUNITY): Payer: Self-pay

## 2023-06-07 ENCOUNTER — Other Ambulatory Visit (HOSPITAL_COMMUNITY): Payer: Self-pay

## 2023-06-07 DIAGNOSIS — L989 Disorder of the skin and subcutaneous tissue, unspecified: Secondary | ICD-10-CM | POA: Diagnosis not present

## 2023-06-07 DIAGNOSIS — S2001XA Contusion of right breast, initial encounter: Secondary | ICD-10-CM | POA: Diagnosis not present

## 2023-06-07 MED ORDER — MUPIROCIN 2 % EX OINT
TOPICAL_OINTMENT | CUTANEOUS | 0 refills | Status: DC
  Filled 2023-06-07: qty 22, 14d supply, fill #0

## 2023-06-07 MED ORDER — DOXYCYCLINE MONOHYDRATE 100 MG PO CAPS
100.0000 mg | ORAL_CAPSULE | Freq: Two times a day (BID) | ORAL | 0 refills | Status: DC
  Filled 2023-06-07: qty 14, 7d supply, fill #0

## 2023-06-12 NOTE — H&P (Signed)
TOTAL KNEE ADMISSION H&P  Patient is being admitted for left total knee arthroplasty.  Subjective:  Chief Complaint: Left knee pain.  HPI: Deanna Simmons, 72 y.o. female has a history of pain and functional disability in the left knee due to {trauma,arthritis:3049029} and has failed non-surgical conservative treatments for greater than 12 weeks to include {nonsurgical conservative treatment (must select two):3049030}. Onset of symptoms was {abrupt, gradual:20671}, starting {1->10 years:3049031} years ago with {stable, gradual worsening, rapidly worsening:3049032} course since that time. The patient noted {no past surgery, prior procedures:3049033} on the left knee.  Patient currently rates pain in the left knee at {1-10:3049035} out of 10 with activity. Patient has {night pain, worsening of pain with activity and weight bearing:3049036}. Patient has evidence of {Radiographic or MRI evidence of (must document one of the below):3046104} by imaging studies. This patient has had {failure of previous osteotomy, distal femur fracture:3049037}. There is no active infection.  Patient Active Problem List   Diagnosis Date Noted   Allergic rhinitis 05/24/2022   Aortic valve disorder 05/24/2022   Valvular endocarditis 05/24/2022   Carotid artery occlusion 05/24/2022   Frequency of micturition 05/24/2022   Functional dyspepsia 05/24/2022   Herpes simplex type 2 infection 05/24/2022   Iron deficiency anemia 05/24/2022   Ischemic stroke (HCC) 05/24/2022   Muscle cramps 05/24/2022   Nephritic syndrome 05/24/2022   Obstructive sleep apnea 05/24/2022   Presence of prosthetic heart valve 05/24/2022   Retinal vascular occlusion 05/24/2022   Trigger thumb of right hand 05/24/2022   Vitamin D deficiency 05/24/2022   History of CVA (cerebrovascular accident) 07/13/2021   S/P TAVR (transcatheter aortic valve replacement) 07/13/2021   LPRD (laryngopharyngeal reflux disease) 03/07/2019   Asthma, well  controlled 12/11/2018   Menopausal symptom 12/11/2018   Type 2 diabetes mellitus (HCC) 12/11/2018   Hammer toe of right foot 06/28/2018   Corns and callosities 06/28/2018   Bunion 06/28/2018   Chronic pain of left knee 09/06/2016   Severe aortic stenosis 07/18/2014   CAD (coronary artery disease) of artery bypass graft    Hypercholesteremia    Obesity    Systemic lupus erythematosus (HCC)    Anemia    Chondromalacia of knee    Synovitis of knee    Hypertension    Fibroids    Internal hemorrhoids    Diverticulum    Vulvitis    Arteriosclerotic vascular disease 10/31/2000    Past Medical History:  Diagnosis Date   Abnormal finding on Pap smear, ASCUS    Anemia    Asthma    Chondromalacia of knee    Coronary artery disease    Diabetes mellitus without complication (HCC)    Diverticulum 12/11/2002   in cecum   Fibroids    h/o   GERD (gastroesophageal reflux disease)    H/O tinea cruris 02/28/2010   History of CVA (cerebrovascular accident)    Hypercholesteremia    Hypertension    Obesity    Pneumonia 07/01/2008   S/P TAVR (transcatheter aortic valve replacement) 07/13/2021   s/p TAVR w/ a 23mm Edwards S3U via the TF approach by Dr. Excell Seltzer and Dr. Laneta Simmers.   Synovitis of knee    Systemic lupus erythematosus (HCC)    with nephritis   Vulvitis    h/o    Past Surgical History:  Procedure Laterality Date   CARDIAC CATHETERIZATION     CESAREAN SECTION     CORONARY ARTERY BYPASS GRAFT     Endometrial curettage  03/29/2000   INTRAOPERATIVE  TRANSTHORACIC ECHOCARDIOGRAM Left 07/13/2021   Procedure: INTRAOPERATIVE TRANSTHORACIC ECHOCARDIOGRAM;  Surgeon: Tonny Bollman, MD;  Location: [facility] OR;  Service: Open Heart Surgery;  Laterality: Left;   RIGHT/LEFT HEART CATH AND CORONARY/GRAFT ANGIOGRAPHY N/A 06/11/2021   Procedure: RIGHT/LEFT HEART CATH AND CORONARY/GRAFT ANGIOGRAPHY;  Surgeon: Lyn Records, MD;  Location: [facility];  Service: Cardiovascular;  Laterality:  N/A;   ROTATOR CUFF REPAIR     TRANSCATHETER AORTIC VALVE REPLACEMENT, TRANSFEMORAL N/A 07/13/2021   Procedure: TRANSCATHETER AORTIC VALVE REPLACEMENT, TRANSFEMORAL;  Surgeon: Tonny Bollman, MD;  Location: [facility] OR;  Service: Open Heart Surgery;  Laterality: N/A;   TUBAL LIGATION     ULTRASOUND GUIDANCE FOR VASCULAR ACCESS Bilateral 07/13/2021   Procedure: ULTRASOUND GUIDANCE FOR VASCULAR ACCESS;  Surgeon: Tonny Bollman, MD;  Location: [facility] OR;  Service: Open Heart Surgery;  Laterality: Bilateral;   UPPER GASTROINTESTINAL ENDOSCOPY  12/11/2002    Prior to Admission medications   Medication Sig Start Date End Date Taking? Authorizing Provider  albuterol (VENTOLIN HFA) 108 (90 Base) MCG/ACT inhaler Inhale 2 puffs into the lungs every 4 to 6 hours as needed for wheezing, coughing or shortness of breath. 12/27/22   Kozlow, Alvira Philips, MD  azithromycin (ZITHROMAX) 500 MG tablet Take one tablet by mouth1 hour before any dental work including cleanings. 04/12/22   Janetta Hora, PA-C  cetirizine (ZYRTEC) 10 MG tablet Take 1 tablet (10 mg total) by mouth daily as needed for allergies (Can use an extra dose during flares). 12/27/22   Kozlow, Alvira Philips, MD  clopidogrel (PLAVIX) 75 MG tablet Take 1 tablet (75 mg total) by mouth daily. 02/08/23   Sharlene Dory, PA-C  dapagliflozin propanediol (FARXIGA) 10 MG TABS tablet Take 1 tablet by mouth daily 05/01/23     doxycycline (MONODOX) 100 MG capsule Take 1 capsule (100 mg total) by mouth 2 (two) times daily. 06/07/23     Estradiol 10 MCG TABS vaginal tablet Place 1 tablet (10 mcg total) vaginally 2 (two) times a week as directed. 10/06/22     Evolocumab (REPATHA SURECLICK) 140 MG/ML SOAJ Inject 140 mg into the skin every 14 (fourteen) days. 05/08/23   Tonny Bollman, MD  famotidine (PEPCID) 40 MG tablet Take 1 tablet (40 mg total) by mouth in the morning. 12/27/22   Kozlow, Alvira Philips, MD  ferrous sulfate 325 (65 FE) MG tablet Take 325 mg by mouth daily.    [provider]  fluconazole (DIFLUCAN) 150 MG tablet Take 1 tablet by mouth today and if symptoms continue take another tablet in 3 days 12/14/22     fluconazole (DIFLUCAN) 150 MG tablet Take 1 tablet (150 mg total) by mouth once and if symptoms continue take another tablet in 3 days. 04/26/23     fluticasone (FLONASE) 50 MCG/ACT nasal spray Place 1 spray into both nostrils daily. 12/27/22   Kozlow, Alvira Philips, MD  Fluticasone Furoate (ARNUITY ELLIPTA) 200 MCG/ACT AEPB Inhale 1 puff into the lungs daily. Use during asthma flares as directed. 12/27/22   Kozlow, Alvira Philips, MD  fluticasone-salmeterol (ADVAIR) 250-50 MCG/ACT AEPB Inhale 1 puff into the lungs in the morning and at bedtime. 12/27/22   Kozlow, Alvira Philips, MD  glucose blood test strip Use to test blood glucose twice daily before meals. 02/07/22     ketoconazole (NIZORAL) 2 % cream Apply to affected area twice a day for skin fungus infection for 14 days 04/26/23     magnesium gluconate (MAGONATE) 500 MG tablet Take 500  mg by mouth daily as needed (cramps).    [provider]  metoprolol succinate (TOPROL-XL) 25 MG 24 hr tablet Take 1 tablet (25 mg total) by mouth daily. 05/05/23   Tonny Bollman, MD  molnupiravir EUA (LAGEVRIO) 200 MG CAPS capsule Take 4 capsules by mouth every 12 hours for 5 days 04/10/23     Multiple Vitamins-Minerals (MULTIVITAL PO) Take 1 tablet by mouth daily.    [provider]  mupirocin ointment (BACTROBAN) 2 % Apply to affected area twice daily for 14 days. 06/07/23     nitroGLYCERIN (NITROSTAT) 0.4 MG SL tablet Place 1 tablet (0.4 mg total) under the tongue every 5 (five) minutes as needed for up to 25 days for chest pain. 07/14/21 03/21/23  Janetta Hora, PA-C  nystatin (MYCOSTATIN/NYSTOP) powder Apply to affected area 2 times a day as needed for 7 days 05/19/23     Probiotic Product (PROBIOTIC DAILY PO) Take 1 capsule by mouth at bedtime. Ultra Flora IB    [provider]  rosuvastatin (CRESTOR) 40 MG  tablet Take 1 tablet (40 mg total) by mouth daily. 05/05/23   Tonny Bollman, MD  Tiotropium Bromide Monohydrate (SPIRIVA RESPIMAT) 1.25 MCG/ACT AERS Take 2 Inhalations by mouth daily as needed. 12/27/22   Kozlow, Alvira Philips, MD  tiZANidine (ZANAFLEX) 4 MG tablet Take 1 tablet (4 mg total) by mouth 3 (three) times daily as needed for muscle spasm 11/23/22     triamcinolone ointment (KENALOG) 0.1 % Apply once a daily in the evening for 2-4 weeks 05/30/23     valACYclovir (VALTREX) 500 MG tablet TAKE 1 TABLET BY MOUTH TWICE DAILY AS NEEDED 12/19/22     vitamin C (ASCORBIC ACID) 500 MG tablet Take 500 mg by mouth daily.    [provider]  Vitamin D-Vitamin K (VITAMIN K2-VITAMIN D3 PO) Take 1 tablet by mouth every other day.    [provider]  zinc gluconate 50 MG tablet Take 50 mg by mouth every evening.    [provider]  metoprolol tartrate (LOPRESSOR) 25 MG tablet Take 1 tablet (25 mg total) by mouth 2 (two) times daily with a meal. 01/10/23 01/10/23      Allergies  Allergen Reactions   Cardiolite [Technetium-90m] Itching   Ciprofloxacin Itching   Clarithromycin Itching   Erythromycin Base Rash   Iodinated Contrast Media Itching    Pt has a potential contrast allergy; She states in 2012 her cardiologist told her she had a reaction during the "study". We are unable to determine the study or confirm a reaction. The patient will have a 13 hr prep for future studies. Molecular Imaging states their injection does not cause any reactions.   Montelukast Sodium Itching   Niacin Itching   Niaspan [Niacin Er] Other (See Comments)    flushing   Other Itching    Chemical Use And Stress Cardiolite Testing (Uncoded)   Penicillins Itching, Hives and Rash    Reaction: over 10 years   Prilosec [Omeprazole] Itching   Singulair [Montelukast] Hives and Itching   Ticlid [Ticlopidine Hcl] Itching    Social History   Socioeconomic History   Marital status: Married    Spouse name:  Gery Pray   Number of children: 4   Years of education: 12+   Highest education level: Not on file  Occupational History   Occupation: CARE MANAGEMENT [employer]    Employer: [employer]  Tobacco Use   Smoking status: Former   Smokeless tobacco: Never  Substance  and Sexual Activity   Alcohol use: Yes    Comment: occasional   Drug use: No   Sexual activity: Not on file  Other Topics Concern   Not on file  Social History Narrative   Lives with her husband. Their children are grown, 1 lives in [place], 1 in [place], [place],  1 in [place], [place], and one in [place], [place].   Right handed   Social Determinants of Health   Financial Resource Strain: Not on file  Food Insecurity: Not on file  Transportation Needs: Not on file  Physical Activity: Not on file  Stress: Not on file  Social Connections: Not on file  Intimate Partner Violence: Not on file    Tobacco Use: Medium Risk (05/05/2023)   Patient History    Smoking Tobacco Use: Former    Smokeless Tobacco Use: Never    Passive Exposure: Not on file   Social History   Substance and Sexual Activity  Alcohol Use Yes   Comment: occasional    Family History  Problem Relation Age of Onset   Diabetes Mother    Stroke Mother    Breast cancer Mother 65   Prostate cancer Father    Diabetes Father    Hyperlipidemia Father    Heart attack Paternal Uncle        2 paternal uncles   Ovarian cancer Cousin     ROS  Objective:  Physical Exam: ***  Vital signs in last 24 hours: BP: ()/()  Arterial Line BP: ()/()   Imaging Review Plain radiographs demonstrate {mild/mod/severe:3049053} degenerative joint disease of the left knee. The overall alignment is {Neutral/varus:3049054}. The bone quality appears to be {good/fair/poor/excellent:33178} for age and reported activity level.  Assessment/Plan:  End stage arthritis, left knee   The patient history, physical examination, clinical judgment of the provider and imaging  studies are consistent with end stage degenerative joint disease of the left knee and total knee arthroplasty is deemed medically necessary. The treatment options including medical management, injection therapy arthroscopy and arthroplasty were discussed at length. The risks and benefits of total knee arthroplasty were presented and reviewed. The risks due to aseptic loosening, infection, stiffness, patella tracking problems, thromboembolic complications and other imponderables were discussed. The patient acknowledged the explanation, agreed to proceed with the plan and consent was signed. Patient is being admitted for inpatient treatment for surgery, pain control, PT, OT, prophylactic antibiotics, VTE prophylaxis, progressive ambulation and ADLs and discharge planning. The patient is planning to be discharged {home with home health services/to inpatient UEAVW:0981191}.  {ORTHOADMISSIONSTATUS:21269}  ***  - Patient was instructed on what medications to stop prior to surgery. - Follow-up visit in 2 weeks with Dr. Lequita Halt - Begin physical therapy following surgery - Pre-operative lab work as pre-surgical testing - Prescriptions will be provided in hospital at time of discharge  Arther Abbott, PA-C Orthopedic Surgery [facility] [place]

## 2023-06-15 DIAGNOSIS — I2510 Atherosclerotic heart disease of native coronary artery without angina pectoris: Secondary | ICD-10-CM | POA: Diagnosis not present

## 2023-06-15 DIAGNOSIS — Z8673 Personal history of transient ischemic attack (TIA), and cerebral infarction without residual deficits: Secondary | ICD-10-CM | POA: Diagnosis not present

## 2023-06-15 DIAGNOSIS — D509 Iron deficiency anemia, unspecified: Secondary | ICD-10-CM | POA: Diagnosis not present

## 2023-06-15 DIAGNOSIS — I10 Essential (primary) hypertension: Secondary | ICD-10-CM | POA: Diagnosis not present

## 2023-06-15 DIAGNOSIS — E1142 Type 2 diabetes mellitus with diabetic polyneuropathy: Secondary | ICD-10-CM | POA: Diagnosis not present

## 2023-06-19 ENCOUNTER — Other Ambulatory Visit: Payer: Self-pay | Admitting: Physician Assistant

## 2023-06-19 ENCOUNTER — Other Ambulatory Visit (HOSPITAL_COMMUNITY): Payer: Self-pay

## 2023-06-19 MED ORDER — AZITHROMYCIN 500 MG PO TABS
ORAL_TABLET | ORAL | 3 refills | Status: AC
  Filled 2023-06-19: qty 1, 1d supply, fill #0
  Filled 2023-09-07: qty 1, 1d supply, fill #1

## 2023-06-22 DIAGNOSIS — H26492 Other secondary cataract, left eye: Secondary | ICD-10-CM | POA: Diagnosis not present

## 2023-06-23 ENCOUNTER — Telehealth: Payer: Self-pay | Admitting: Cardiology

## 2023-06-23 NOTE — Telephone Encounter (Signed)
Received clearance request from Dr. Tana Felts team for patients upcoming left knee arthroplasty scheduled for 07/10/2023. Patient underwent TAVR 07/13/2021 and has followed closely with our team and primary cardiologist since that time. She continues to work full time with no concerning heart failure symptoms. Echo from 07/08/2022 with EF 60%, mild asymmetric LVH, normally functioning TAVR with a mean gradient of 14 mmHg and no PVL. Severe MAC with mild MR. She was most recently seen by Dr. Excell Seltzer 03/2023 with plans to follow in six months and repeat echo at that time. She remains on Plavix and ortho team asking for holding recommendations. Given her hx of prior CAD s/p CABG and CVA, would recommend using ASA 81mg  every day while holding Plavix during her surgical course if possible.    Georgie Chard NP-C Structural Heart Team  Pager: [phone] Phone: [phone]

## 2023-06-23 NOTE — Patient Instructions (Signed)
DUE TO COVID-19 ONLY TWO VISITORS  (aged 73 and older)  ARE ALLOWED TO COME WITH YOU AND STAY IN THE WAITING ROOM ONLY DURING PRE OP AND PROCEDURE.   **NO VISITORS ARE ALLOWED IN THE SHORT STAY AREA OR RECOVERY ROOM!!**  IF YOU WILL BE ADMITTED INTO THE HOSPITAL YOU ARE ALLOWED ONLY FOUR SUPPORT PEOPLE DURING VISITATION HOURS ONLY (7 AM -8PM)   The support person(s) must pass our screening, gel in and out, and wear a mask at all times, including in the patient's room. Patients must also wear a mask when staff or their support person are in the room. Visitors GUEST BADGE MUST BE WORN VISIBLY  One adult visitor may remain with you overnight and MUST be in the room by 8 P.M.     Your procedure is scheduled on: 07/10/23   Report to [facility] Main Entrance    Report to admitting at : 5:15 AM   Call this number if you have problems the morning of surgery [phone]   Do not eat food :After Midnight.   After Midnight you may have the following liquids until: 4:00 AM DAY OF SURGERY  Water Black Coffee (sugar ok, NO MILK/CREAM OR CREAMERS)  Tea (sugar ok, NO MILK/CREAM OR CREAMERS) regular and decaf                             Plain Jell-O (NO RED)                                           Fruit ices (not with fruit pulp, NO RED)                                     Popsicles (NO RED)                                                                  Juice: apple, WHITE grape, WHITE cranberry Sports drinks like Gatorade (NO RED)   The day of surgery:  Drink ONE (1) Pre-Surgery Clear G2 at : 4:00 AM the morning of surgery. Drink in one sitting. Do not sip.  This drink was given to you during your hospital  pre-op appointment visit. Nothing else to drink after completing the  Pre-Surgery Clear Ensure or G2.          If you have questions, please contact your surgeon's office.  FOLLOW ANY ADDITIONAL PRE OP INSTRUCTIONS YOU RECEIVED FROM YOUR SURGEON'S OFFICE!!!   Oral Hygiene is  also important to reduce your risk of infection.                                    Remember - BRUSH YOUR TEETH THE MORNING OF SURGERY WITH YOUR REGULAR TOOTHPASTE  DENTURES WILL BE REMOVED PRIOR TO SURGERY PLEASE DO NOT APPLY "Poly grip" OR ADHESIVES!!!   Do NOT smoke after Midnight   Take these medicines the morning of surgery with  A SIP OF WATER: cetirizine,metoprolol,famotidine.Use inhalers as usual.Tylenol as needed. How to Manage Your Diabetes Before and After Surgery  Why is it important to control my blood sugar before and after surgery? Improving blood sugar levels before and after surgery helps healing and can limit problems. A way of improving blood sugar control is eating a healthy diet by:  Eating less sugar and carbohydrates  Increasing activity/exercise  Talking with your doctor about reaching your blood sugar goals High blood sugars (greater than 180 mg/dL) can raise your risk of infections and slow your recovery, so you will need to focus on controlling your diabetes during the weeks before surgery. Make sure that the doctor who takes care of your diabetes knows about your planned surgery including the date and location.  How do I manage my blood sugar before surgery? Check your blood sugar at least 4 times a day, starting 2 days before surgery, to make sure that the level is not too high or low. Check your blood sugar the morning of your surgery when you wake up and every 2 hours until you get to the Short Stay unit. If your blood sugar is less than 70 mg/dL, you will need to treat for low blood sugar: Do not take insulin. Treat a low blood sugar (less than 70 mg/dL) with  cup of clear juice (cranberry or apple), 4 glucose tablets, OR glucose gel. Recheck blood sugar in 15 minutes after treatment (to make sure it is greater than 70 mg/dL). If your blood sugar is not greater than 70 mg/dL on recheck, call [phone] for further instructions. Report your blood sugar to  the short stay nurse when you get to Short Stay.  If you are admitted to the hospital after surgery: Your blood sugar will be checked by the staff and you will probably be given insulin after surgery (instead of oral diabetes medicines) to make sure you have good blood sugar levels. The goal for blood sugar control after surgery is 80-180 mg/dL.   WHAT DO I DO ABOUT MY DIABETES MEDICATION?  Do not take farxiga after: 07/06/23    THE MORNING OF SURGERY, DO NOT TAKE ANY ORAL DIABETIC MEDICATIONS DAY OF YOUR SURGERY  DO NOT TAKE THE FOLLOWING 7 DAYS PRIOR TO SURGERY: Ozempic, Wegovy, Rybelsus (Semaglutide), Byetta (exenatide), Bydureon (exenatide ER), Victoza, Saxenda (liraglutide), or Trulicity (dulaglutide) Mounjaro (Tirzepatide) Adlyxin (Lixisenatide), Polyethylene Glycol Loxenatide.                              You may not have any metal on your body including hair pins, jewelry, and body piercing             Do not wear make-up, lotions, powders, perfumes/cologne, or deodorant  Do not wear nail polish including gel and S&S, artificial/acrylic nails, or any other type of covering on natural nails including finger and toenails. If you have artificial nails, gel coating, etc. that needs to be removed by a nail salon please have this removed prior to surgery or surgery may need to be canceled/ delayed if the surgeon/ anesthesia feels like they are unable to be safely monitored.   Do not shave  48 hours prior to surgery.    Do not bring valuables to the hospital. [facility] IS NOT             RESPONSIBLE   FOR VALUABLES.   Contacts, glasses, or bridgework may not be worn  into surgery.   Bring small overnight bag day of surgery.   DO NOT BRING YOUR HOME MEDICATIONS TO THE HOSPITAL. PHARMACY WILL DISPENSE MEDICATIONS LISTED ON YOUR MEDICATION LIST TO YOU DURING YOUR ADMISSION IN THE HOSPITAL!    Patients discharged on the day of surgery will not be allowed to drive home.  Someone NEEDS to  stay with you for the first 24 hours after anesthesia.   Special Instructions: Bring a copy of your healthcare power of attorney and living will documents         the day of surgery if you haven't scanned them before.              Please read over the following fact sheets you were given: IF YOU HAVE QUESTIONS ABOUT YOUR PRE-OP INSTRUCTIONS PLEASE CALL [phone]      Pre-operative 5 CHG Bath Instructions   You can play a key role in reducing the risk of infection after surgery. Your skin needs to be as free of germs as possible. You can reduce the number of germs on your skin by washing with CHG (chlorhexidine gluconate) soap before surgery. CHG is an antiseptic soap that kills germs and continues to kill germs even after washing.   DO NOT use if you have an allergy to chlorhexidine/CHG or antibacterial soaps. If your skin becomes reddened or irritated, stop using the CHG and notify one of our RNs at : [phone].   Please shower with the CHG soap starting 4 days before surgery using the following schedule:     Please keep in mind the following:  DO NOT shave, including legs and underarms, starting the day of your first shower.   You may shave your face at any point before/day of surgery.  Place clean sheets on your bed the day you start using CHG soap. Use a clean washcloth (not used since being washed) for each shower. DO NOT sleep with pets once you start using the CHG.   CHG Shower Instructions:  If you choose to wash your hair and private area, wash first with your normal shampoo/soap.  After you use shampoo/soap, rinse your hair and body thoroughly to remove shampoo/soap residue.  Turn the water OFF and apply about 3 tablespoons (45 ml) of CHG soap to a CLEAN washcloth.  Apply CHG soap ONLY FROM YOUR NECK DOWN TO YOUR TOES (washing for 3-5 minutes)  DO NOT use CHG soap on face, private areas, open wounds, or sores.  Pay special attention to the area where your surgery is  being performed.  If you are having back surgery, having someone wash your back for you may be helpful. Wait 2 minutes after CHG soap is applied, then you may rinse off the CHG soap.  Pat dry with a clean towel  Put on clean clothes/pajamas   If you choose to wear lotion, please use ONLY the CHG-compatible lotions on the back of this paper.     Additional instructions for the day of surgery: DO NOT APPLY any lotions, deodorants, cologne, or perfumes.   Put on clean/comfortable clothes.  Brush your teeth.  Ask your nurse before applying any prescription medications to the skin.    CHG Compatible Lotions   Aveeno Moisturizing lotion  Cetaphil Moisturizing Cream  Cetaphil Moisturizing Lotion  Clairol Herbal Essence Moisturizing Lotion, Dry Skin  Clairol Herbal Essence Moisturizing Lotion, Extra Dry Skin  Clairol Herbal Essence Moisturizing Lotion, Normal Skin  Curel Age Defying Therapeutic Moisturizing Lotion with  Alpha Hydroxy  Curel Extreme Care Body Lotion  Curel Soothing Hands Moisturizing Hand Lotion  Curel Therapeutic Moisturizing Cream, Fragrance-Free  Curel Therapeutic Moisturizing Lotion, Fragrance-Free  Curel Therapeutic Moisturizing Lotion, Original Formula  Eucerin Daily Replenishing Lotion  Eucerin Dry Skin Therapy Plus Alpha Hydroxy Crme  Eucerin Dry Skin Therapy Plus Alpha Hydroxy Lotion  Eucerin Original Crme  Eucerin Original Lotion  Eucerin Plus Crme Eucerin Plus Lotion  Eucerin TriLipid Replenishing Lotion  Keri Anti-Bacterial Hand Lotion  Keri Deep Conditioning Original Lotion Dry Skin Formula Softly Scented  Keri Deep Conditioning Original Lotion, Fragrance Free Sensitive Skin Formula  Keri Lotion Fast Absorbing Fragrance Free Sensitive Skin Formula  Keri Lotion Fast Absorbing Softly Scented Dry Skin Formula  Keri Original Lotion  Keri Skin Renewal Lotion Keri Silky Smooth Lotion  Keri Silky Smooth Sensitive Skin Lotion  Nivea Body Creamy Conditioning  Oil  Nivea Body Extra Enriched Lotion  Nivea Body Original Lotion  Nivea Body Sheer Moisturizing Lotion Nivea Crme  Nivea Skin Firming Lotion  NutraDerm 30 Skin Lotion  NutraDerm Skin Lotion  NutraDerm Therapeutic Skin Cream  NutraDerm Therapeutic Skin Lotion  ProShield Protective Hand Cream  Provon moisturizing lotion   Incentive Spirometer  An incentive spirometer is a tool that can help keep your lungs clear and active. This tool measures how well you are filling your lungs with each breath. Taking long deep breaths may help reverse or decrease the chance of developing breathing (pulmonary) problems (especially infection) following: A long period of time when you are unable to move or be active. BEFORE THE PROCEDURE  If the spirometer includes an indicator to show your best effort, your nurse or respiratory therapist will set it to a desired goal. If possible, sit up straight or lean slightly forward. Try not to slouch. Hold the incentive spirometer in an upright position. INSTRUCTIONS FOR USE  Sit on the edge of your bed if possible, or sit up as far as you can in bed or on a chair. Hold the incentive spirometer in an upright position. Breathe out normally. Place the mouthpiece in your mouth and seal your lips tightly around it. Breathe in slowly and as deeply as possible, raising the piston or the ball toward the top of the column. Hold your breath for 3-5 seconds or for as long as possible. Allow the piston or ball to fall to the bottom of the column. Remove the mouthpiece from your mouth and breathe out normally. Rest for a few seconds and repeat Steps 1 through 7 at least 10 times every 1-2 hours when you are awake. Take your time and take a few normal breaths between deep breaths. The spirometer may include an indicator to show your best effort. Use the indicator as a goal to work toward during each repetition. After each set of 10 deep breaths, practice coughing to be sure  your lungs are clear. If you have an incision (the cut made at the time of surgery), support your incision when coughing by placing a pillow or rolled up towels firmly against it. Once you are able to get out of bed, walk around indoors and cough well. You may stop using the incentive spirometer when instructed by your caregiver.  RISKS AND COMPLICATIONS Take your time so you do not get dizzy or light-headed. If you are in pain, you may need to take or ask for pain medication before doing incentive spirometry. It is harder to take a deep breath if you are having pain.  AFTER USE Rest and breathe slowly and easily. It can be helpful to keep track of a log of your progress. Your caregiver can provide you with a simple table to help with this. If you are using the spirometer at home, follow these instructions: SEEK MEDICAL CARE IF:  You are having difficultly using the spirometer. You have trouble using the spirometer as often as instructed. Your pain medication is not giving enough relief while using the spirometer. You develop fever of 100.5 F (38.1 C) or higher. SEEK IMMEDIATE MEDICAL CARE IF:  You cough up bloody sputum that had not been present before. You develop fever of 102 F (38.9 C) or greater. You develop worsening pain at or near the incision site. MAKE SURE YOU:  Understand these instructions. Will watch your condition. Will get help right away if you are not doing well or get worse. Document Released: 02/27/2007 Document Revised: 01/09/2012 Document Reviewed: 04/30/2007 [facility] Patient Information 2014 [place], [place].   ________________________________________________________________________

## 2023-06-24 ENCOUNTER — Other Ambulatory Visit (HOSPITAL_COMMUNITY): Payer: Self-pay

## 2023-06-27 ENCOUNTER — Encounter (HOSPITAL_COMMUNITY)
Admission: RE | Admit: 2023-06-27 | Discharge: 2023-06-27 | Disposition: A | Discharge status: Home / Self Care | Payer: PRIVATE HEALTH INSURANCE | Source: Ambulatory Visit | Attending: Orthopedic Surgery | Admitting: Orthopedic Surgery

## 2023-06-27 ENCOUNTER — Encounter (HOSPITAL_COMMUNITY): Payer: Self-pay

## 2023-06-27 ENCOUNTER — Other Ambulatory Visit: Payer: Self-pay

## 2023-06-27 VITALS — BP 126/78 | HR 60 | Temp 98.1°F | Ht 64.0 in | Wt 202.0 lb

## 2023-06-27 DIAGNOSIS — I10 Essential (primary) hypertension: Secondary | ICD-10-CM | POA: Diagnosis not present

## 2023-06-27 DIAGNOSIS — E1159 Type 2 diabetes mellitus with other circulatory complications: Secondary | ICD-10-CM | POA: Insufficient documentation

## 2023-06-27 DIAGNOSIS — Z01818 Encounter for other preprocedural examination: Secondary | ICD-10-CM | POA: Diagnosis present

## 2023-06-27 DIAGNOSIS — Z01812 Encounter for preprocedural laboratory examination: Secondary | ICD-10-CM | POA: Insufficient documentation

## 2023-06-27 HISTORY — DX: Reserved for concepts with insufficient information to code with codable children: IMO0002

## 2023-06-27 HISTORY — DX: Cardiac murmur, unspecified: R01.1

## 2023-06-27 HISTORY — DX: Systemic lupus erythematosus, unspecified: M32.9

## 2023-06-27 LAB — BASIC METABOLIC PANEL
Anion gap: 8 (ref 5–15)
BUN: 15 mg/dL (ref 8–23)
CO2: 25 mmol/L (ref 22–32)
Calcium: 9.4 mg/dL (ref 8.9–10.3)
Chloride: 106 mmol/L (ref 98–111)
Creatinine, Ser: 0.61 mg/dL (ref 0.44–1.00)
GFR, Estimated: >60 mL/min (ref >60)
Glucose, Bld: 109 mg/dL — ABNORMAL HIGH (ref 70–99)
Potassium: 4.2 mmol/L (ref 3.5–5.1)
Sodium: 139 mmol/L (ref 135–145)

## 2023-06-27 LAB — HEMOGLOBIN A1C
Hgb A1c MFr Bld: 5.7 % — ABNORMAL HIGH (ref 4.8–5.6)
Mean Plasma Glucose: 116.89 mg/dL

## 2023-06-27 LAB — SURGICAL PCR SCREEN
MRSA, PCR: POSITIVE — AB
Staphylococcus aureus: POSITIVE — AB

## 2023-06-27 LAB — GLUCOSE, CAPILLARY: Glucose-Capillary: 109 mg/dL — ABNORMAL HIGH (ref 70–99)

## 2023-06-27 LAB — CBC
HCT: 39.8 % (ref 36.0–46.0)
Hemoglobin: 12.2 g/dL (ref 12.0–15.0)
MCH: 28.4 pg (ref 26.0–34.0)
MCHC: 30.7 g/dL (ref 30.0–36.0)
MCV: 92.6 fL (ref 80.0–100.0)
Platelets: 211 10*3/uL (ref 150–400)
RBC: 4.3 MIL/uL (ref 3.87–5.11)
RDW: 13.2 % (ref 11.5–15.5)
WBC: 6.7 10*3/uL (ref 4.0–10.5)
nRBC: 0 % (ref 0.0–0.2)

## 2023-06-27 NOTE — Progress Notes (Addendum)
For Short Stay: COVID SWAB appointment date:  Bowel Prep reminder:   For Anesthesia: PCP - Dr. Thana Ates. Cardiologist - Dr. Tonny Bollman. LOV: 03/21/23 Clearance: Georgie Chard: NP: 06/23/23 Chest x-ray -  EKG - 03/21/23 Stress Test -  ECHO - 07/08/22 Cardiac Cath - 06/11/21 Pacemaker/ICD device last checked: Pacemaker orders received: Device Rep notified:  Spinal Cord Stimulator: N/A  Sleep Study - Yes CPAP - NO  Fasting Blood Sugar - 90's - 100's Checks Blood Sugar ___1__ times a day Date and result of last Hgb A1c-  Last dose of GLP1 agonist- N/A GLP1 instructions:   Last dose of SGLT-2 inhibitors- Farxiga : 07/06/23 SGLT-2 instructions:   Blood Thinner Instructions: Plavix: Hold on 07/04/23 Aspirin Instructions: Last Dose:  Activity level: Can go up a flight of stairs and activities of daily living without stopping and without chest pain and/or shortness of breath   Able to exercise without chest pain and/or shortness of breath  Anesthesia review: Hx: CAD,DIA,CVA,HTN,CABG,TAVR,OSA(NO CPAP)  Patient denies shortness of breath, fever, cough and chest pain at PAT appointment   Patient verbalized understanding of instructions that were given to them at the PAT appointment. Patient was also instructed that they will need to review over the PAT instructions again at home before surgery.

## 2023-06-27 NOTE — Progress Notes (Signed)
PCR: + MRSA./+ STAPH 

## 2023-07-01 DIAGNOSIS — Z1212 Encounter for screening for malignant neoplasm of rectum: Secondary | ICD-10-CM | POA: Diagnosis not present

## 2023-07-01 DIAGNOSIS — Z1211 Encounter for screening for malignant neoplasm of colon: Secondary | ICD-10-CM | POA: Diagnosis not present

## 2023-07-04 ENCOUNTER — Ambulatory Visit: Payer: Commercial Managed Care - PPO

## 2023-07-04 ENCOUNTER — Ambulatory Visit: Payer: Medicare Other | Admitting: Allergy and Immunology

## 2023-07-04 NOTE — Anesthesia Preprocedure Evaluation (Addendum)
Anesthesia Evaluation  Patient identified by MRN, date of birth, ID band Patient awake    Reviewed: Allergy & Precautions, NPO status , Patient's Chart, lab work & pertinent test results, reviewed documented beta blocker date and time   History of Anesthesia Complications Negative for: history of anesthetic complications  Airway Mallampati: III   Neck ROM: Full    Dental  (+) Edentulous Upper   Pulmonary asthma , sleep apnea , neg pneumonia , former smoker   breath sounds clear to auscultation       Cardiovascular METShypertension, (-) angina + CAD and + Past MI (remote)  + Valvular Problems/Murmurs (s/p tavr)  Rhythm:Regular Rate:Normal     Neuro/Psych neg Seizures CVA, No Residual Symptoms    GI/Hepatic ,GERD  ,,(+) neg Cirrhosis        Endo/Other  diabetes, Type 2    Renal/GU Renal disease     Musculoskeletal  (+) Arthritis ,    Abdominal   Peds  Hematology   Anesthesia Other Findings   Reproductive/Obstetrics                              Anesthesia Physical Anesthesia Plan  ASA: 3  Anesthesia Plan: Spinal and Regional   Post-op Pain Management:    Induction: Intravenous  PONV Risk Score and Plan: 1 and Propofol infusion and Ondansetron  Airway Management Planned:   Additional Equipment:   Intra-op Plan:   Post-operative Plan:   Informed Consent: I have reviewed the patients History and Physical, chart, labs and discussed the procedure including the risks, benefits and alternatives for the proposed anesthesia with the patient or authorized representative who has indicated his/her understanding and acceptance.     Dental advisory given  Plan Discussed with:   Anesthesia Plan Comments: (See PAT note 06/27/2023)        Anesthesia Quick Evaluation

## 2023-07-04 NOTE — Progress Notes (Signed)
Anesthesia Chart Review   Case: [id number] Date/Time: 07/10/23 0700   Procedure: TOTAL KNEE ARTHROPLASTY (Left: Knee)   Anesthesia type: Choice   Pre-op diagnosis: left knee osteoarthritis   Location: [facility] 09 / [facility]   Surgeons: Ollen Gross, MD       DISCUSSION:72 y.o. former smoker with h/o HTN, Lupus, CVA, DM II, CAD (CABG 2004), s/p TAVR 2022, left knee OA scheduled for above procedure 07/10/2023 with Dr. Ollen Gross.   Per cardiology preoperative evaluation 06/23/2023, "Received clearance request from Dr. Tana Felts team for patients upcoming left knee arthroplasty scheduled for 07/10/2023. Patient underwent TAVR 07/13/2021 and has followed closely with our team and primary cardiologist since that time. She continues to work full time with no concerning heart failure symptoms. Echo from 07/08/2022 with EF 60%, mild asymmetric LVH, normally functioning TAVR with a mean gradient of 14 mmHg and no PVL. Severe MAC with mild MR. She was most recently seen by Dr. Excell Seltzer 03/2023 with plans to follow in six months and repeat echo at that time. She remains on Plavix and ortho team asking for holding recommendations. Given her hx of prior CAD s/p CABG and CVA, would recommend using ASA 81mg  every day while holding Plavix during her surgical course if possible."  Pt reports last dose of Plavix 07/04/2023.  VS: BP 126/78   Pulse 60   Temp 36.7 C (Oral)   Ht 5\' 4"  (1.626 m)   Wt 91.6 kg   SpO2 99%   BMI 34.67 kg/m   PROVIDERS: Thana Ates, MD is PCP    LABS: Labs reviewed: Acceptable for surgery. (all labs ordered are listed, but only abnormal results are displayed)  Labs Reviewed  SURGICAL PCR SCREEN - Abnormal; Notable for the following components:      Result Value   MRSA, PCR POSITIVE (*)    Staphylococcus aureus POSITIVE (*)    All other components within normal limits  HEMOGLOBIN A1C - Abnormal; Notable for the following components:   Hgb A1c MFr Bld 5.7 (*)    All other  components within normal limits  BASIC METABOLIC PANEL - Abnormal; Notable for the following components:   Glucose, Bld 109 (*)    All other components within normal limits  GLUCOSE, CAPILLARY - Abnormal; Notable for the following components:   Glucose-Capillary 109 (*)    All other components within normal limits  CBC     IMAGES:   EKG:   CV: Echo 07/08/2022 1. Left ventricular ejection fraction, by estimation, is 60 to 65%. The  left ventricle has normal function. The left ventricle has no regional  wall motion abnormalities. There is mild asymmetric left ventricular  hypertrophy of the basal-septal segment.  Left ventricular diastolic parameters are consistent with Grade I  diastolic dysfunction (impaired relaxation). Elevated left ventricular  end-diastolic pressure.   2. Right ventricular systolic function is normal. The right ventricular  size is normal.   3. Left atrial size was mildly dilated.   4. The mitral valve is normal in structure. Mild mitral valve  regurgitation. No evidence of mitral stenosis. Severe mitral annular  calcification.   5. TAVR gradients are stable. The aortic valve has been  repaired/replaced. Aortic valve regurgitation is not visualized. No aortic  stenosis is present. Echo findings are consistent with normal structure  and function of the aortic valve prosthesis.  Aortic valve mean gradient measures 14.0 mmHg. Aortic valve Vmax measures  2.56 m/s.   6. The inferior vena cava  is normal in size with greater than 50%  respiratory variability, suggesting right atrial pressure of 3 mmHg.  Past Medical History:  Diagnosis Date   Abnormal finding on Pap smear, ASCUS    Anemia    Asthma    Chondromalacia of knee    Coronary artery disease    Diabetes mellitus without complication (HCC)    Diverticulum 12/11/2002   in cecum   Fibroids    h/o   GERD (gastroesophageal reflux disease)    H/O tinea cruris 02/28/2010   Heart murmur    History  of CVA (cerebrovascular accident)    Hypercholesteremia    Hypertension    Lupus (HCC)    Obesity    Pneumonia 07/01/2008   S/P TAVR (transcatheter aortic valve replacement) 07/13/2021   s/p TAVR w/ a 23mm Edwards S3U via the TF approach by Dr. Excell Seltzer and Dr. Laneta Simmers.   Synovitis of knee    Systemic lupus erythematosus (HCC)    with nephritis   Vulvitis    h/o    Past Surgical History:  Procedure Laterality Date   CARDIAC CATHETERIZATION     CATARACT EXTRACTION, BILATERAL     CESAREAN SECTION     CORONARY ARTERY BYPASS GRAFT     Endometrial curettage  03/29/2000   INTRAOPERATIVE TRANSTHORACIC ECHOCARDIOGRAM Left 07/13/2021   Procedure: INTRAOPERATIVE TRANSTHORACIC ECHOCARDIOGRAM;  Surgeon: Tonny Bollman, MD;  Location: [facility] OR;  Service: Open Heart Surgery;  Laterality: Left;   RIGHT/LEFT HEART CATH AND CORONARY/GRAFT ANGIOGRAPHY N/A 06/11/2021   Procedure: RIGHT/LEFT HEART CATH AND CORONARY/GRAFT ANGIOGRAPHY;  Surgeon: Lyn Records, MD;  Location: [facility] INVASIVE CV LAB;  Service: Cardiovascular;  Laterality: N/A;   ROTATOR CUFF REPAIR Right    TRANSCATHETER AORTIC VALVE REPLACEMENT, TRANSFEMORAL N/A 07/13/2021   Procedure: TRANSCATHETER AORTIC VALVE REPLACEMENT, TRANSFEMORAL;  Surgeon: Tonny Bollman, MD;  Location: [facility] OR;  Service: Open Heart Surgery;  Laterality: N/A;   TUBAL LIGATION     ULTRASOUND GUIDANCE FOR VASCULAR ACCESS Bilateral 07/13/2021   Procedure: ULTRASOUND GUIDANCE FOR VASCULAR ACCESS;  Surgeon: Tonny Bollman, MD;  Location: [facility] OR;  Service: Open Heart Surgery;  Laterality: Bilateral;   UPPER GASTROINTESTINAL ENDOSCOPY  12/11/2002    MEDICATIONS:  acetaminophen (TYLENOL) 325 MG tablet   albuterol (VENTOLIN HFA) 108 (90 Base) MCG/ACT inhaler   azithromycin (ZITHROMAX) 500 MG tablet   azithromycin (ZITHROMAX) 500 MG tablet   beclomethasone (QVAR) 80 MCG/ACT inhaler   cetirizine (ZYRTEC) 10 MG tablet   clopidogrel (PLAVIX) 75 MG tablet   dapagliflozin  propanediol (FARXIGA) 10 MG TABS tablet   doxycycline (MONODOX) 100 MG capsule   Estradiol 10 MCG TABS vaginal tablet   Evolocumab (REPATHA SURECLICK) 140 MG/ML SOAJ   famotidine (PEPCID) 40 MG tablet   ferrous sulfate 325 (65 FE) MG tablet   fluconazole (DIFLUCAN) 150 MG tablet   fluticasone (FLONASE) 50 MCG/ACT nasal spray   Fluticasone Furoate (ARNUITY ELLIPTA) 200 MCG/ACT AEPB   fluticasone-salmeterol (ADVAIR) 250-50 MCG/ACT AEPB   glucose blood test strip   ketoconazole (NIZORAL) 2 % cream   metoprolol succinate (TOPROL-XL) 25 MG 24 hr tablet   molnupiravir EUA (LAGEVRIO) 200 MG CAPS capsule   Multiple Vitamins-Minerals (MULTIVITAL PO)   mupirocin ointment (BACTROBAN) 2 %   nitroGLYCERIN (NITROSTAT) 0.4 MG SL tablet   nystatin (MYCOSTATIN/NYSTOP) powder   Probiotic Product (PROBIOTIC DAILY PO)   rosuvastatin (CRESTOR) 40 MG tablet   Tiotropium Bromide Monohydrate (SPIRIVA RESPIMAT) 1.25 MCG/ACT AERS   tiZANidine (ZANAFLEX) 4 MG tablet  triamcinolone ointment (KENALOG) 0.1 %   valACYclovir (VALTREX) 500 MG tablet   vitamin C (ASCORBIC ACID) 500 MG tablet   Vitamin D-Vitamin K (VITAMIN K2-VITAMIN D3 PO)   zinc gluconate 50 MG tablet   No current facility-administered medications for this encounter.     Jodell Cipro Ward, PA-C [facility] Pre-Surgical Testing [phone]

## 2023-07-05 ENCOUNTER — Ambulatory Visit: Payer: Commercial Managed Care - PPO | Attending: Cardiovascular Disease

## 2023-07-05 DIAGNOSIS — E7800 Pure hypercholesterolemia, unspecified: Secondary | ICD-10-CM | POA: Diagnosis not present

## 2023-07-05 LAB — HEPATIC FUNCTION PANEL
ALT: 19 IU/L (ref 0–32)
AST: 24 IU/L (ref 0–40)
Albumin: 4.1 g/dL (ref 3.8–4.8)
Alkaline Phosphatase: 95 IU/L (ref 44–121)
Bilirubin Total: 0.3 mg/dL (ref 0.0–1.2)
Bilirubin, Direct: <0.1 mg/dL (ref 0.00–0.40)
Total Protein: 7.3 g/dL (ref 6.0–8.5)

## 2023-07-05 LAB — LIPID PANEL
Chol/HDL Ratio: 2 ratio (ref 0.0–4.4)
Cholesterol, Total: 119 mg/dL (ref 100–199)
HDL: 60 mg/dL (ref >39)
LDL Chol Calc (NIH): 45 mg/dL (ref 0–99)
Triglycerides: 67 mg/dL (ref 0–149)
VLDL Cholesterol Cal: 14 mg/dL (ref 5–40)

## 2023-07-10 ENCOUNTER — Encounter (HOSPITAL_COMMUNITY): Payer: Self-pay | Admitting: Orthopedic Surgery

## 2023-07-10 ENCOUNTER — Encounter (HOSPITAL_COMMUNITY)
Admission: RE | Disposition: A | Discharge status: Home / Self Care | Payer: Self-pay | Source: Ambulatory Visit | Attending: Orthopedic Surgery

## 2023-07-10 ENCOUNTER — Ambulatory Visit (HOSPITAL_BASED_OUTPATIENT_CLINIC_OR_DEPARTMENT_OTHER): Payer: PRIVATE HEALTH INSURANCE | Admitting: Anesthesiology

## 2023-07-10 ENCOUNTER — Other Ambulatory Visit (HOSPITAL_COMMUNITY): Payer: Self-pay

## 2023-07-10 ENCOUNTER — Other Ambulatory Visit: Payer: Self-pay

## 2023-07-10 ENCOUNTER — Observation Stay (HOSPITAL_COMMUNITY)
Admission: RE | Admit: 2023-07-10 | Discharge: 2023-07-12 | Disposition: A | Discharge status: Home / Self Care | Payer: PRIVATE HEALTH INSURANCE | Source: Ambulatory Visit | Attending: Orthopedic Surgery | Admitting: Orthopedic Surgery

## 2023-07-10 ENCOUNTER — Ambulatory Visit (HOSPITAL_COMMUNITY): Payer: PRIVATE HEALTH INSURANCE | Admitting: Physician Assistant

## 2023-07-10 DIAGNOSIS — E119 Type 2 diabetes mellitus without complications: Secondary | ICD-10-CM | POA: Insufficient documentation

## 2023-07-10 DIAGNOSIS — J45909 Unspecified asthma, uncomplicated: Secondary | ICD-10-CM | POA: Insufficient documentation

## 2023-07-10 DIAGNOSIS — Z87891 Personal history of nicotine dependence: Secondary | ICD-10-CM | POA: Insufficient documentation

## 2023-07-10 DIAGNOSIS — I10 Essential (primary) hypertension: Secondary | ICD-10-CM | POA: Insufficient documentation

## 2023-07-10 DIAGNOSIS — M179 Osteoarthritis of knee, unspecified: Secondary | ICD-10-CM | POA: Diagnosis present

## 2023-07-10 DIAGNOSIS — Z951 Presence of aortocoronary bypass graft: Secondary | ICD-10-CM | POA: Diagnosis not present

## 2023-07-10 DIAGNOSIS — G4733 Obstructive sleep apnea (adult) (pediatric): Secondary | ICD-10-CM | POA: Diagnosis not present

## 2023-07-10 DIAGNOSIS — Z8673 Personal history of transient ischemic attack (TIA), and cerebral infarction without residual deficits: Secondary | ICD-10-CM | POA: Insufficient documentation

## 2023-07-10 DIAGNOSIS — Z79899 Other long term (current) drug therapy: Secondary | ICD-10-CM | POA: Diagnosis not present

## 2023-07-10 DIAGNOSIS — M1712 Unilateral primary osteoarthritis, left knee: Secondary | ICD-10-CM

## 2023-07-10 DIAGNOSIS — I2510 Atherosclerotic heart disease of native coronary artery without angina pectoris: Secondary | ICD-10-CM | POA: Diagnosis not present

## 2023-07-10 HISTORY — PX: TOTAL KNEE ARTHROPLASTY: SHX125

## 2023-07-10 LAB — GLUCOSE, CAPILLARY
Glucose-Capillary: 123 mg/dL — ABNORMAL HIGH (ref 70–99)
Glucose-Capillary: 135 mg/dL — ABNORMAL HIGH (ref 70–99)
Glucose-Capillary: 162 mg/dL — ABNORMAL HIGH (ref 70–99)
Glucose-Capillary: 145 mg/dL — ABNORMAL HIGH (ref 70–99)

## 2023-07-10 LAB — HEMOGLOBIN A1C
Hgb A1c MFr Bld: 5.7 % — ABNORMAL HIGH (ref 4.8–5.6)
Mean Plasma Glucose: 116.89 mg/dL

## 2023-07-10 SURGERY — ARTHROPLASTY, KNEE, TOTAL
Anesthesia: Regional | Site: Knee | Laterality: Left

## 2023-07-10 MED ORDER — ORAL CARE MOUTH RINSE: 15.0000 mL | Freq: Once | OROMUCOSAL | Status: AC

## 2023-07-10 MED ORDER — ONDANSETRON HCL 4 MG/2ML IJ SOLN: 4.0000 mg | Freq: Once | INTRAMUSCULAR | Status: DC | PRN

## 2023-07-10 MED ORDER — LACTATED RINGERS IV SOLN: INTRAVENOUS | Status: DC

## 2023-07-10 MED ORDER — DEXAMETHASONE SODIUM PHOSPHATE 10 MG/ML IJ SOLN
10.0000 mg | Freq: Once | INTRAMUSCULAR | Status: AC
  Administered 2023-07-11: 10 mg via INTRAVENOUS
  Filled 2023-07-10: qty 1

## 2023-07-10 MED ORDER — FAMOTIDINE 20 MG PO TABS
40.0000 mg | ORAL_TABLET | Freq: Every day | ORAL | Status: DC
  Administered 2023-07-11 – 2023-07-12 (×2): 40 mg via ORAL
  Filled 2023-07-10 (×2): qty 2

## 2023-07-10 MED ORDER — OXYCODONE HCL 5 MG/5ML PO SOLN: 5.0000 mg | Freq: Once | ORAL | Status: DC | PRN

## 2023-07-10 MED ORDER — ALBUTEROL SULFATE HFA 108 (90 BASE) MCG/ACT IN AERS: 2.0000 | INHALATION_SPRAY | RESPIRATORY_TRACT | Status: DC | PRN

## 2023-07-10 MED ORDER — CHLORHEXIDINE GLUCONATE 0.12 % MT SOLN
15.0000 mL | Freq: Once | OROMUCOSAL | Status: AC
  Administered 2023-07-10: 15 mL via OROMUCOSAL

## 2023-07-10 MED ORDER — PROPOFOL 1000 MG/100ML IV EMUL
INTRAVENOUS | Status: AC
  Filled 2023-07-10: qty 100

## 2023-07-10 MED ORDER — ONDANSETRON HCL 4 MG/2ML IJ SOLN
4.0000 mg | Freq: Four times a day (QID) | INTRAMUSCULAR | Status: DC | PRN
  Administered 2023-07-10: 4 mg via INTRAVENOUS
  Filled 2023-07-10: qty 2

## 2023-07-10 MED ORDER — FENTANYL CITRATE PF 50 MCG/ML IJ SOSY: 25.0000 ug | PREFILLED_SYRINGE | INTRAMUSCULAR | Status: DC | PRN

## 2023-07-10 MED ORDER — ASPIRIN 81 MG PO CHEW: 81.0000 mg | CHEWABLE_TABLET | Freq: Every day | ORAL | Status: DC

## 2023-07-10 MED ORDER — VANCOMYCIN HCL IN DEXTROSE 1-5 GM/200ML-% IV SOLN
1000.0000 mg | Freq: Once | INTRAVENOUS | Status: AC
  Administered 2023-07-10: 1000 mg via INTRAVENOUS

## 2023-07-10 MED ORDER — DAPAGLIFLOZIN PROPANEDIOL 10 MG PO TABS
10.0000 mg | ORAL_TABLET | Freq: Every day | ORAL | Status: DC
  Administered 2023-07-11 – 2023-07-12 (×2): 10 mg via ORAL
  Filled 2023-07-10 (×2): qty 1

## 2023-07-10 MED ORDER — PHENYLEPHRINE HCL-NACL 20-0.9 MG/250ML-% IV SOLN
INTRAVENOUS | Status: DC | PRN
Start: 2023-07-10 — End: 2023-07-10
  Administered 2023-07-10: 30 ug/min via INTRAVENOUS

## 2023-07-10 MED ORDER — DEXAMETHASONE SODIUM PHOSPHATE 10 MG/ML IJ SOLN
INTRAMUSCULAR | Status: AC
  Filled 2023-07-10: qty 1

## 2023-07-10 MED ORDER — HYDROMORPHONE HCL 1 MG/ML IJ SOLN
1.0000 mg | INTRAMUSCULAR | Status: DC | PRN
  Administered 2023-07-10 – 2023-07-11 (×2): 1 mg via INTRAVENOUS
  Filled 2023-07-10 (×2): qty 1

## 2023-07-10 MED ORDER — ZINC SULFATE 220 (50 ZN) MG PO CAPS
220.0000 mg | ORAL_CAPSULE | Freq: Every evening | ORAL | Status: DC
  Administered 2023-07-10 – 2023-07-11 (×2): 220 mg via ORAL
  Filled 2023-07-10 (×2): qty 1

## 2023-07-10 MED ORDER — ONDANSETRON HCL 4 MG/2ML IJ SOLN
INTRAMUSCULAR | Status: AC
  Filled 2023-07-10: qty 2

## 2023-07-10 MED ORDER — BUPIVACAINE LIPOSOME 1.3 % IJ SUSP
20.0000 mL | Freq: Once | INTRAMUSCULAR | Status: AC
  Administered 2023-07-10: 10 mL

## 2023-07-10 MED ORDER — DEXAMETHASONE SODIUM PHOSPHATE 10 MG/ML IJ SOLN
8.0000 mg | Freq: Once | INTRAMUSCULAR | Status: AC
  Administered 2023-07-10: 8 mg via INTRAVENOUS

## 2023-07-10 MED ORDER — ACETAMINOPHEN 500 MG PO TABS
500.0000 mg | ORAL_TABLET | Freq: Four times a day (QID) | ORAL | Status: AC
  Administered 2023-07-10 – 2023-07-11 (×4): 500 mg via ORAL
  Filled 2023-07-10 (×4): qty 1

## 2023-07-10 MED ORDER — ONDANSETRON HCL 4 MG/2ML IJ SOLN
INTRAMUSCULAR | Status: DC | PRN
  Administered 2023-07-10: 4 mg via INTRAVENOUS

## 2023-07-10 MED ORDER — PHENOL 1.4 % MT LIQD: 1.0000 | OROMUCOSAL | Status: DC | PRN

## 2023-07-10 MED ORDER — INSULIN ASPART 100 UNIT/ML IJ SOLN
0.0000 [IU] | Freq: Three times a day (TID) | INTRAMUSCULAR | Status: DC
  Administered 2023-07-10: 3 [IU] via SUBCUTANEOUS
  Administered 2023-07-11 – 2023-07-12 (×3): 2 [IU] via SUBCUTANEOUS

## 2023-07-10 MED ORDER — DOCUSATE SODIUM 100 MG PO CAPS
100.0000 mg | ORAL_CAPSULE | Freq: Two times a day (BID) | ORAL | Status: DC
  Administered 2023-07-10 – 2023-07-12 (×4): 100 mg via ORAL
  Filled 2023-07-10 (×4): qty 1

## 2023-07-10 MED ORDER — FLEET ENEMA RE ENEM: 1.0000 | ENEMA | Freq: Once | RECTAL | Status: DC | PRN

## 2023-07-10 MED ORDER — DIPHENHYDRAMINE HCL 12.5 MG/5ML PO ELIX: 12.5000 mg | ORAL_SOLUTION | ORAL | Status: DC | PRN

## 2023-07-10 MED ORDER — SODIUM CHLORIDE (PF) 0.9 % IJ SOLN
INTRAMUSCULAR | Status: AC
  Filled 2023-07-10: qty 10

## 2023-07-10 MED ORDER — BUPIVACAINE LIPOSOME 1.3 % IJ SUSP
INTRAMUSCULAR | Status: DC | PRN
  Administered 2023-07-10: 10 mL

## 2023-07-10 MED ORDER — TRAMADOL HCL 50 MG PO TABS
50.0000 mg | ORAL_TABLET | Freq: Four times a day (QID) | ORAL | Status: DC | PRN
  Administered 2023-07-10: 50 mg via ORAL
  Administered 2023-07-11: 100 mg via ORAL
  Administered 2023-07-12: 50 mg via ORAL
  Administered 2023-07-12: 100 mg via ORAL
  Filled 2023-07-10 (×2): qty 2
  Filled 2023-07-10: qty 1
  Filled 2023-07-10: qty 2

## 2023-07-10 MED ORDER — LORATADINE 10 MG PO TABS
10.0000 mg | ORAL_TABLET | Freq: Every day | ORAL | Status: DC
  Administered 2023-07-11 – 2023-07-12 (×2): 10 mg via ORAL
  Filled 2023-07-10 (×2): qty 1

## 2023-07-10 MED ORDER — OXYCODONE HCL 5 MG PO TABS: 5.0000 mg | ORAL_TABLET | Freq: Once | ORAL | Status: DC | PRN

## 2023-07-10 MED ORDER — METOCLOPRAMIDE HCL 5 MG PO TABS: 5.0000 mg | ORAL_TABLET | Freq: Three times a day (TID) | ORAL | Status: DC | PRN

## 2023-07-10 MED ORDER — MUPIROCIN 2 % EX OINT
1.0000 | TOPICAL_OINTMENT | Freq: Two times a day (BID) | CUTANEOUS | 0 refills | Status: AC
  Filled 2023-07-10: qty 44, 30d supply, fill #0

## 2023-07-10 MED ORDER — FLUTICASONE PROPIONATE 50 MCG/ACT NA SUSP
1.0000 | Freq: Every day | NASAL | Status: DC
  Administered 2023-07-11 – 2023-07-12 (×2): 1 via NASAL
  Filled 2023-07-10: qty 16

## 2023-07-10 MED ORDER — METHOCARBAMOL 500 MG PO TABS
500.0000 mg | ORAL_TABLET | Freq: Four times a day (QID) | ORAL | Status: DC | PRN
  Administered 2023-07-11 – 2023-07-12 (×5): 500 mg via ORAL
  Filled 2023-07-10 (×6): qty 1

## 2023-07-10 MED ORDER — BUPIVACAINE LIPOSOME 1.3 % IJ SUSP
INTRAMUSCULAR | Status: AC
  Filled 2023-07-10: qty 10

## 2023-07-10 MED ORDER — FERROUS SULFATE 325 (65 FE) MG PO TABS
325.0000 mg | ORAL_TABLET | Freq: Every day | ORAL | Status: DC
  Administered 2023-07-10 – 2023-07-12 (×3): 325 mg via ORAL
  Filled 2023-07-10 (×3): qty 1

## 2023-07-10 MED ORDER — MIDAZOLAM HCL 2 MG/2ML IJ SOLN
INTRAMUSCULAR | Status: AC
  Filled 2023-07-10: qty 2

## 2023-07-10 MED ORDER — ACETAMINOPHEN 10 MG/ML IV SOLN
1000.0000 mg | Freq: Four times a day (QID) | INTRAVENOUS | Status: DC
  Administered 2023-07-10: 1000 mg via INTRAVENOUS
  Filled 2023-07-10: qty 100

## 2023-07-10 MED ORDER — BISACODYL 10 MG RE SUPP: 10.0000 mg | Freq: Every day | RECTAL | Status: DC | PRN

## 2023-07-10 MED ORDER — FLUTICASONE FUROATE-VILANTEROL 200-25 MCG/ACT IN AEPB
1.0000 | INHALATION_SPRAY | Freq: Every day | RESPIRATORY_TRACT | Status: DC
  Administered 2023-07-11 – 2023-07-12 (×2): 1 via RESPIRATORY_TRACT
  Filled 2023-07-10: qty 28

## 2023-07-10 MED ORDER — POVIDONE-IODINE 10 % EX SWAB: 2.0000 | Freq: Once | CUTANEOUS | Status: DC

## 2023-07-10 MED ORDER — BUPIVACAINE LIPOSOME 1.3 % IJ SUSP
INTRAMUSCULAR | Status: AC
  Filled 2023-07-10: qty 20

## 2023-07-10 MED ORDER — NITROGLYCERIN 0.4 MG SL SUBL: 0.4000 mg | SUBLINGUAL_TABLET | SUBLINGUAL | Status: DC | PRN

## 2023-07-10 MED ORDER — MENTHOL 3 MG MT LOZG: 1.0000 | LOZENGE | OROMUCOSAL | Status: DC | PRN

## 2023-07-10 MED ORDER — PHENYLEPHRINE 80 MCG/ML (10ML) SYRINGE FOR IV PUSH (FOR BLOOD PRESSURE SUPPORT)
PREFILLED_SYRINGE | INTRAVENOUS | Status: AC
  Filled 2023-07-10: qty 10

## 2023-07-10 MED ORDER — SODIUM CHLORIDE 0.9 % IR SOLN
Status: DC | PRN
  Administered 2023-07-10: 1000 mL

## 2023-07-10 MED ORDER — MIDAZOLAM HCL 5 MG/5ML IJ SOLN
INTRAMUSCULAR | Status: DC | PRN
  Administered 2023-07-10: 1 mg via INTRAVENOUS

## 2023-07-10 MED ORDER — SODIUM CHLORIDE (PF) 0.9 % IJ SOLN
INTRAMUSCULAR | Status: AC
  Filled 2023-07-10: qty 50

## 2023-07-10 MED ORDER — PHENYLEPHRINE HCL (PRESSORS) 10 MG/ML IV SOLN
INTRAVENOUS | Status: DC | PRN
  Administered 2023-07-10 (×3): 80 ug via INTRAVENOUS

## 2023-07-10 MED ORDER — CHLORHEXIDINE GLUCONATE 4 % EX SOLN
1.0000 | CUTANEOUS | 1 refills | Status: DC
  Filled 2023-07-10: qty 711, 30d supply, fill #0

## 2023-07-10 MED ORDER — 0.9 % SODIUM CHLORIDE (POUR BTL) OPTIME
TOPICAL | Status: DC | PRN
  Administered 2023-07-10: 1000 mL

## 2023-07-10 MED ORDER — SODIUM CHLORIDE (PF) 0.9 % IJ SOLN
INTRAMUSCULAR | Status: DC | PRN
  Administered 2023-07-10: 30 mL

## 2023-07-10 MED ORDER — CLOPIDOGREL BISULFATE 75 MG PO TABS
75.0000 mg | ORAL_TABLET | Freq: Every day | ORAL | Status: DC
  Administered 2023-07-11 – 2023-07-12 (×2): 75 mg via ORAL
  Filled 2023-07-10 (×2): qty 1

## 2023-07-10 MED ORDER — OXYCODONE HCL 5 MG PO TABS
5.0000 mg | ORAL_TABLET | ORAL | Status: DC | PRN
  Administered 2023-07-10 – 2023-07-12 (×6): 10 mg via ORAL
  Filled 2023-07-10 (×8): qty 2

## 2023-07-10 MED ORDER — INSULIN ASPART 100 UNIT/ML IJ SOLN: 0.0000 [IU] | INTRAMUSCULAR | Status: DC | PRN

## 2023-07-10 MED ORDER — BUPIVACAINE IN DEXTROSE 0.75-8.25 % IT SOLN
INTRATHECAL | Status: DC | PRN
Start: 2023-07-10 — End: 2023-07-10
  Administered 2023-07-10: 1.8 mL via INTRATHECAL

## 2023-07-10 MED ORDER — FENTANYL CITRATE (PF) 100 MCG/2ML IJ SOLN
INTRAMUSCULAR | Status: AC
  Filled 2023-07-10: qty 2

## 2023-07-10 MED ORDER — FENTANYL CITRATE (PF) 100 MCG/2ML IJ SOLN
INTRAMUSCULAR | Status: DC | PRN
  Administered 2023-07-10: 50 ug via INTRAVENOUS

## 2023-07-10 MED ORDER — ROSUVASTATIN CALCIUM 20 MG PO TABS
40.0000 mg | ORAL_TABLET | Freq: Every day | ORAL | Status: DC
  Administered 2023-07-11 – 2023-07-12 (×2): 40 mg via ORAL
  Filled 2023-07-10 (×2): qty 2

## 2023-07-10 MED ORDER — METHOCARBAMOL 500 MG IVPB - SIMPLE MED: 500.0000 mg | Freq: Four times a day (QID) | INTRAVENOUS | Status: DC | PRN

## 2023-07-10 MED ORDER — CEFAZOLIN SODIUM-DEXTROSE 2-4 GM/100ML-% IV SOLN
2.0000 g | INTRAVENOUS | Status: AC
  Administered 2023-07-10: 2 g via INTRAVENOUS

## 2023-07-10 MED ORDER — TIOTROPIUM BROMIDE MONOHYDRATE 1.25 MCG/ACT IN AERS: 2.0000 | INHALATION_SPRAY | Freq: Every day | RESPIRATORY_TRACT | Status: DC | PRN

## 2023-07-10 MED ORDER — BUPIVACAINE HCL (PF) 0.5 % IJ SOLN
INTRAMUSCULAR | Status: DC | PRN
Start: 2023-07-10 — End: 2023-07-10
  Administered 2023-07-10: 10 mL

## 2023-07-10 MED ORDER — UMECLIDINIUM BROMIDE 62.5 MCG/ACT IN AEPB: 1.0000 | INHALATION_SPRAY | Freq: Every day | RESPIRATORY_TRACT | Status: DC | PRN

## 2023-07-10 MED ORDER — POLYETHYLENE GLYCOL 3350 17 G PO PACK: 17.0000 g | PACK | Freq: Every day | ORAL | Status: DC | PRN

## 2023-07-10 MED ORDER — ALBUTEROL SULFATE (2.5 MG/3ML) 0.083% IN NEBU: 2.5000 mg | INHALATION_SOLUTION | RESPIRATORY_TRACT | Status: DC | PRN

## 2023-07-10 MED ORDER — METOCLOPRAMIDE HCL 5 MG/ML IJ SOLN: 5.0000 mg | Freq: Three times a day (TID) | INTRAMUSCULAR | Status: DC | PRN

## 2023-07-10 MED ORDER — BACID PO TABS
1.0000 | ORAL_TABLET | Freq: Every day | ORAL | Status: DC
  Filled 2023-07-10: qty 1

## 2023-07-10 MED ORDER — TRANEXAMIC ACID-NACL 1000-0.7 MG/100ML-% IV SOLN
1000.0000 mg | INTRAVENOUS | Status: AC
  Administered 2023-07-10: 1000 mg via INTRAVENOUS
  Filled 2023-07-10: qty 100

## 2023-07-10 MED ORDER — PROPOFOL 500 MG/50ML IV EMUL
INTRAVENOUS | Status: DC | PRN
  Administered 2023-07-10: 30 mg via INTRAVENOUS
  Administered 2023-07-10: 30 ug/kg/min via INTRAVENOUS

## 2023-07-10 MED ORDER — RISAQUAD PO CAPS
1.0000 | ORAL_CAPSULE | Freq: Every day | ORAL | Status: DC
  Administered 2023-07-11 – 2023-07-12 (×2): 1 via ORAL
  Filled 2023-07-10 (×2): qty 1

## 2023-07-10 MED ORDER — ONDANSETRON HCL 4 MG PO TABS: 4.0000 mg | ORAL_TABLET | Freq: Four times a day (QID) | ORAL | Status: DC | PRN

## 2023-07-10 MED ORDER — MORPHINE SULFATE (PF) 2 MG/ML IV SOLN
1.0000 mg | INTRAVENOUS | Status: DC | PRN
  Administered 2023-07-10 (×2): 1 mg via INTRAVENOUS
  Filled 2023-07-10 (×2): qty 1

## 2023-07-10 MED ORDER — CEFAZOLIN SODIUM-DEXTROSE 2-4 GM/100ML-% IV SOLN
2.0000 g | Freq: Four times a day (QID) | INTRAVENOUS | Status: AC
  Administered 2023-07-10 (×2): 2 g via INTRAVENOUS
  Filled 2023-07-10 (×2): qty 100

## 2023-07-10 MED ORDER — STERILE WATER FOR IRRIGATION IR SOLN
Status: DC | PRN
  Administered 2023-07-10: 2000 mL

## 2023-07-10 MED ORDER — METOPROLOL SUCCINATE ER 25 MG PO TB24
25.0000 mg | ORAL_TABLET | Freq: Every day | ORAL | Status: DC
  Administered 2023-07-11 – 2023-07-12 (×2): 25 mg via ORAL
  Filled 2023-07-10 (×2): qty 1

## 2023-07-10 MED ORDER — SODIUM CHLORIDE 0.9 % IV SOLN: INTRAVENOUS | Status: DC

## 2023-07-10 MED ORDER — ACETAMINOPHEN 10 MG/ML IV SOLN: 1000.0000 mg | Freq: Once | INTRAVENOUS | Status: DC | PRN

## 2023-07-10 SURGICAL SUPPLY — 55 items
ADH SKN CLS APL DERMABOND .7 (GAUZE/BANDAGES/DRESSINGS) ×1
ATTUNE PS FEM LT SZ 5 CEM KNEE (Femur) IMPLANT
ATTUNE PSRP INSE SZ5 7 KNEE (Insert) IMPLANT
BAG COUNTER SPONGE SURGICOUNT (BAG) IMPLANT
BAG SPEC THK2 15X12 ZIP CLS (MISCELLANEOUS) ×1
BAG SPNG CNTER NS LX DISP (BAG)
BAG ZIPLOCK 12X15 (MISCELLANEOUS) ×1 IMPLANT
BASEPLATE TIBIAL ROTATING SZ 4 (Knees) IMPLANT
BLADE SAG 18X100X1.27 (BLADE) ×1 IMPLANT
BLADE SAW SGTL 11.0X1.19X90.0M (BLADE) ×1 IMPLANT
BNDG CMPR 6 X 5 YARDS HK CLSR (GAUZE/BANDAGES/DRESSINGS) ×1
BNDG CMPR MED 10X6 ELC LF (GAUZE/BANDAGES/DRESSINGS) ×1
BNDG ELASTIC 6INX 5YD STR LF (GAUZE/BANDAGES/DRESSINGS) ×1 IMPLANT
BNDG ELASTIC 6X10 VLCR STRL LF (GAUZE/BANDAGES/DRESSINGS) IMPLANT
BOWL SMART MIX CTS (DISPOSABLE) ×1 IMPLANT
BSPLAT TIB 4 CMNT ROT PLAT STR (Knees) ×1 IMPLANT
CEMENT HV SMART SET (Cement) ×2 IMPLANT
COVER SURGICAL LIGHT HANDLE (MISCELLANEOUS) ×1 IMPLANT
CUFF TOURN SGL QUICK 34 (TOURNIQUET CUFF) ×1
CUFF TRNQT CYL 34X4.125X (TOURNIQUET CUFF) ×1 IMPLANT
DERMABOND ADVANCED .7 DNX12 (GAUZE/BANDAGES/DRESSINGS) ×1 IMPLANT
DRAPE U-SHAPE 47X51 STRL (DRAPES) ×1 IMPLANT
DRSG AQUACEL AG ADV 3.5X10 (GAUZE/BANDAGES/DRESSINGS) ×1 IMPLANT
DURAPREP 26ML APPLICATOR (WOUND CARE) ×1 IMPLANT
ELECT REM PT RETURN 15FT ADLT (MISCELLANEOUS) ×1 IMPLANT
GLOVE BIO SURGEON STRL SZ 6.5 (GLOVE) IMPLANT
GLOVE BIO SURGEON STRL SZ8 (GLOVE) ×1 IMPLANT
GLOVE BIOGEL PI IND STRL 6.5 (GLOVE) IMPLANT
GLOVE BIOGEL PI IND STRL 7.0 (GLOVE) IMPLANT
GLOVE BIOGEL PI IND STRL 8 (GLOVE) ×1 IMPLANT
GOWN STRL REUS W/ TWL LRG LVL3 (GOWN DISPOSABLE) ×1 IMPLANT
GOWN STRL REUS W/TWL LRG LVL3 (GOWN DISPOSABLE) ×1
HANDPIECE INTERPULSE COAX TIP (DISPOSABLE) ×1
HOLDER FOLEY CATH W/STRAP (MISCELLANEOUS) IMPLANT
IMMOBILIZER KNEE 20 (SOFTGOODS) ×1
IMMOBILIZER KNEE 20 THIGH 36 (SOFTGOODS) ×1 IMPLANT
KIT TURNOVER KIT A (KITS) IMPLANT
MANIFOLD NEPTUNE II (INSTRUMENTS) ×1 IMPLANT
NS IRRIG 1000ML POUR BTL (IV SOLUTION) ×1 IMPLANT
PACK TOTAL KNEE CUSTOM (KITS) ×1 IMPLANT
PADDING CAST COTTON 6X4 STRL (CAST SUPPLIES) ×2 IMPLANT
PATELLA MEDIAL ATTUN 35MM KNEE (Knees) IMPLANT
PIN STEINMAN FIXATION KNEE (PIN) IMPLANT
PROTECTOR NERVE ULNAR (MISCELLANEOUS) ×1 IMPLANT
SET HNDPC FAN SPRY TIP SCT (DISPOSABLE) ×1 IMPLANT
SPIKE FLUID TRANSFER (MISCELLANEOUS) ×1 IMPLANT
SUT MNCRL AB 4-0 PS2 18 (SUTURE) ×1 IMPLANT
SUT STRATAFIX 0 PDS 27 VIOLET (SUTURE) ×1
SUT VIC AB 2-0 CT1 27 (SUTURE) ×3
SUT VIC AB 2-0 CT1 TAPERPNT 27 (SUTURE) ×3 IMPLANT
SUTURE STRATFX 0 PDS 27 VIOLET (SUTURE) ×1 IMPLANT
TRAY FOLEY MTR SLVR 16FR STAT (SET/KITS/TRAYS/PACK) ×1 IMPLANT
TUBE SUCTION HIGH CAP CLEAR NV (SUCTIONS) ×1 IMPLANT
WATER STERILE IRR 1000ML POUR (IV SOLUTION) ×2 IMPLANT
WRAP KNEE MAXI GEL POST OP (GAUZE/BANDAGES/DRESSINGS) ×1 IMPLANT

## 2023-07-10 NOTE — Anesthesia Postprocedure Evaluation (Signed)
Anesthesia Post Note  Patient: Deanna Simmons  Procedure(s) Performed: TOTAL KNEE ARTHROPLASTY (Left: Knee)     Patient location during evaluation: PACU Anesthesia Type: Regional Level of consciousness: oriented and awake and alert Pain management: pain level controlled Vital Signs Assessment: post-procedure vital signs reviewed and stable Respiratory status: spontaneous breathing, respiratory function stable and patient connected to nasal cannula oxygen Cardiovascular status: blood pressure returned to baseline and stable Postop Assessment: no headache, no backache and no apparent nausea or vomiting Anesthetic complications: no   No notable events documented.  Last Vitals:  Vitals:   07/10/23 1100 07/10/23 1131  BP: (!) 135/58 122/63  Pulse: 63   Resp: 16 16  Temp:  36.7 C  SpO2: 98% 99%    Last Pain:  Vitals:   07/10/23 1318  TempSrc:   PainSc: 5                  Mariann Barter

## 2023-07-10 NOTE — Discharge Instructions (Signed)
Ollen Gross, MD Total Joint Specialist [facility] [address] [place], [place] [address] [phone]  TOTAL KNEE REPLACEMENT POSTOPERATIVE DIRECTIONS    Knee Rehabilitation, Guidelines Following Surgery  Results after knee surgery are often greatly improved when you follow the exercise, range of motion and muscle strengthening exercises prescribed by your doctor. Safety measures are also important to protect the knee from further injury. If any of these exercises cause you to have increased pain or swelling in your knee joint, decrease the amount until you are comfortable again and slowly increase them. If you have problems or questions, call your caregiver or physical therapist for advice.   BLOOD CLOT PREVENTION Take Plavix (clopidogrel) as you normally do. Take a 325 mg Aspirin once a day for three weeks following surgery. Then take an 81 mg aspirin once a day for three weeks. Then discontinue.  You may resume your vitamins/supplements upon discharge from the hospital. Do not take any NSAIDs (Advil, Aleve, Ibuprofen, Meloxicam, etc.).  HOME CARE INSTRUCTIONS  Remove items at home which could result in a fall. This includes throw rugs or furniture in walking pathways.  ICE to the affected knee as much as tolerated. Icing helps control swelling. If the swelling is well controlled you will be more comfortable and rehab easier. Continue to use ice on the knee for pain and swelling from surgery. You may notice swelling that will progress down to the foot and ankle. This is normal after surgery. Elevate the leg when you are not up walking on it.    Continue to use the breathing machine which will help keep your temperature down. It is common for your temperature to cycle up and down following surgery, especially at night when you are not up moving around and exerting yourself. The breathing machine keeps your lungs expanded and your temperature down. Do not  place pillow under the operative knee, focus on keeping the knee straight while resting  DIET You may resume your previous home diet once you are discharged from the hospital.  DRESSING / WOUND CARE / SHOWERING Keep your bulky bandage on for 2 days. On the third post-operative day you may remove the Ace bandage and gauze. There is a waterproof adhesive bandage on your skin which will stay in place until your first follow-up appointment. Once you remove this you will not need to place another bandage You may begin showering 3 days following surgery, but do not submerge the incision under water.  ACTIVITY For the first 5 days, the key is rest and control of pain and swelling Do your home exercises twice a day starting on post-operative day 3. On the days you go to physical therapy, just do the home exercises once that day. You should rest, ice and elevate the leg for 50 minutes out of every hour. Get up and walk/stretch for 10 minutes per hour. After 5 days you can increase your activity slowly as tolerated. Walk with your walker as instructed. Use the walker until you are comfortable transitioning to a cane. Walk with the cane in the opposite hand of the operative leg. You may discontinue the cane once you are comfortable and walking steadily. Avoid periods of inactivity such as sitting longer than an hour when not asleep. This helps prevent blood clots.  You may discontinue the knee immobilizer once you are able to perform a straight leg raise while lying down. You may resume a sexual relationship in one month or when  given the OK by your doctor.  You may return to work once you are cleared by your doctor.  Do not drive a car for 6 weeks or until released by your surgeon.  Do not drive while taking narcotics.  TED HOSE STOCKINGS Wear the elastic stockings on both legs for three weeks following surgery during the day. You may remove them at night for sleeping.  WEIGHT BEARING Weight bearing  as tolerated with assist device (walker, cane, etc) as directed, use it as long as suggested by your surgeon or therapist, typically at least 4-6 weeks.  POSTOPERATIVE CONSTIPATION PROTOCOL Constipation - defined medically as fewer than three stools per week and severe constipation as less than one stool per week.  One of the most common issues patients have following surgery is constipation.  Even if you have a regular bowel pattern at home, your normal regimen is likely to be disrupted due to multiple reasons following surgery.  Combination of anesthesia, postoperative narcotics, change in appetite and fluid intake all can affect your bowels.  In order to avoid complications following surgery, here are some recommendations in order to help you during your recovery period.  Colace (docusate) - Pick up an over-the-counter form of Colace or another stool softener and take twice a day as long as you are requiring postoperative pain medications.  Take with a full glass of water daily.  If you experience loose stools or diarrhea, hold the colace until you stool forms back up. If your symptoms do not get better within 1 week or if they get worse, check with your doctor. Dulcolax (bisacodyl) - Pick up over-the-counter and take as directed by the product packaging as needed to assist with the movement of your bowels.  Take with a full glass of water.  Use this product as needed if not relieved by Colace only.  MiraLax (polyethylene glycol) - Pick up over-the-counter to have on hand. MiraLax is a solution that will increase the amount of water in your bowels to assist with bowel movements.  Take as directed and can mix with a glass of water, juice, soda, coffee, or tea. Take if you go more than two days without a movement. Do not use MiraLax more than once per day. Call your doctor if you are still constipated or irregular after using this medication for 7 days in a row.  If you continue to have problems with  postoperative constipation, please contact the office for further assistance and recommendations.  If you experience "the worst abdominal pain ever" or develop nausea or vomiting, please contact the office immediatly for further recommendations for treatment.  ITCHING If you experience itching with your medications, try taking only a single pain pill, or even half a pain pill at a time.  You can also use Benadryl over the counter for itching or also to help with sleep.   MEDICATIONS See your medication summary on the "After Visit Summary" that the nursing staff will review with you prior to discharge.  You may have some home medications which will be placed on hold until you complete the course of blood thinner medication.  It is important for you to complete the blood thinner medication as prescribed by your surgeon.  Continue your approved medications as instructed at time of discharge.  PRECAUTIONS If you experience chest pain or shortness of breath - call 911 immediately for transfer to the hospital emergency department.  If you develop a fever greater that 101 F, purulent drainage  from wound, increased redness or drainage from wound, foul odor from the wound/dressing, or calf pain - CONTACT YOUR SURGEON.                                                   FOLLOW-UP APPOINTMENTS Make sure you keep all of your appointments after your operation with your surgeon and caregivers. You should call the office at the above phone number and make an appointment for approximately two weeks after the date of your surgery or on the date instructed by your surgeon outlined in the "After Visit Summary".  RANGE OF MOTION AND STRENGTHENING EXERCISES  Rehabilitation of the knee is important following a knee injury or an operation. After just a few days of immobilization, the muscles of the thigh which control the knee become weakened and shrink (atrophy). Knee exercises are designed to build up the tone and strength  of the thigh muscles and to improve knee motion. Often times heat used for twenty to thirty minutes before working out will loosen up your tissues and help with improving the range of motion but do not use heat for the first two weeks following surgery. These exercises can be done on a training (exercise) mat, on the floor, on a table or on a bed. Use what ever works the best and is most comfortable for you Knee exercises include:  Leg Lifts - While your knee is still immobilized in a splint or cast, you can do straight leg raises. Lift the leg to 60 degrees, hold for 3 sec, and slowly lower the leg. Repeat 10-20 times 2-3 times daily. Perform this exercise against resistance later as your knee gets better.  Quad and Hamstring Sets - Tighten up the muscle on the front of the thigh (Quad) and hold for 5-10 sec. Repeat this 10-20 times hourly. Hamstring sets are done by pushing the foot backward against an object and holding for 5-10 sec. Repeat as with quad sets.  Leg Slides: Lying on your back, slowly slide your foot toward your buttocks, bending your knee up off the floor (only go as far as is comfortable). Then slowly slide your foot back down until your leg is flat on the floor again. Angel Wings: Lying on your back spread your legs to the side as far apart as you can without causing discomfort.  A rehabilitation program following serious knee injuries can speed recovery and prevent re-injury in the future due to weakened muscles. Contact your doctor or a physical therapist for more information on knee rehabilitation.   POST-OPERATIVE OPIOID TAPER INSTRUCTIONS: It is important to wean off of your opioid medication as soon as possible. If you do not need pain medication after your surgery it is ok to stop day one. Opioids include: Codeine, Hydrocodone(Norco, Vicodin), Oxycodone(Percocet, oxycontin) and hydromorphone amongst others.  Long term and even short term use of opiods can cause: Increased pain  response Dependence Constipation Depression Respiratory depression And more.  Withdrawal symptoms can include Flu like symptoms Nausea, vomiting And more Techniques to manage these symptoms Hydrate well Eat regular healthy meals Stay active Use relaxation techniques(deep breathing, meditating, yoga) Do Not substitute Alcohol to help with tapering If you have been on opioids for less than two weeks and do not have pain than it is ok to stop all together.  Plan to wean  off of opioids This plan should start within one week post op of your joint replacement. Maintain the same interval or time between taking each dose and first decrease the dose.  Cut the total daily intake of opioids by one tablet each day Next start to increase the time between doses. The last dose that should be eliminated is the evening dose.   IF YOU ARE TRANSFERRED TO A SKILLED REHAB FACILITY If the patient is transferred to a skilled rehab facility following release from the hospital, a list of the current medications will be sent to the facility for the patient to continue.  When discharged from the skilled rehab facility, please have the facility set up the patient's Home Health Physical Therapy prior to being released. Also, the skilled facility will be responsible for providing the patient with their medications at time of release from the facility to include their pain medication, the muscle relaxants, and their blood thinner medication. If the patient is still at the rehab facility at time of the two week follow up appointment, the skilled rehab facility will also need to assist the patient in arranging follow up appointment in our office and any transportation needs.  MAKE SURE YOU:  Understand these instructions.  Get help right away if you are not doing well or get worse.   DENTAL ANTIBIOTICS:  In most cases prophylactic antibiotics for Dental procdeures after total joint surgery are not  necessary.  Exceptions are as follows:  1. History of prior total joint infection  2. Severely immunocompromised (Organ Transplant, cancer chemotherapy, Rheumatoid biologic meds such as Humera)  3. Poorly controlled diabetes (A1C &gt; 8.0, blood glucose over 200)  If you have one of these conditions, contact your surgeon for an antibiotic prescription, prior to your dental procedure.    Pick up stool softner and laxative for home use following surgery while on pain medications. Do not submerge incision under water. Please use good hand washing techniques while changing dressing each day. May shower starting three days after surgery. Please use a clean towel to pat the incision dry following showers. Continue to use ice for pain and swelling after surgery. Do not use any lotions or creams on the incision until instructed by your surgeon.

## 2023-07-10 NOTE — Anesthesia Procedure Notes (Signed)
Anesthesia Regional Block: Adductor canal block   Pre-Anesthetic Checklist: , timeout performed,  Correct Patient, Correct Site, Correct Laterality,  Correct Procedure, Correct Position, site marked,  Risks and benefits discussed,  Surgical consent,  Pre-op evaluation,  At surgeon's request and post-op pain management  Laterality: Left  Prep: Maximum Sterile Barrier Precautions used, chloraprep       Needles:  Injection technique: Single-shot  Needle Type: Echogenic Needle      Needle Gauge: 20     Additional Needles:   Procedures:,,,, ultrasound used (permanent image in chart),,    Narrative:  Start time: 07/10/2023 7:00 AM End time: 07/10/2023 7:05 AM Injection made incrementally with aspirations every 5 mL.  Performed by: Personally  Anesthesiologist: Mariann Barter, MD

## 2023-07-10 NOTE — Interval H&P Note (Signed)
History and Physical Interval Note:  07/10/2023 6:34 AM  Thelma Barge  has presented today for surgery, with the diagnosis of left knee osteoarthritis.  The various methods of treatment have been discussed with the patient and family. After consideration of risks, benefits and other options for treatment, the patient has consented to  Procedure(s): TOTAL KNEE ARTHROPLASTY (Left) as a surgical intervention.  The patient's history has been reviewed, patient examined, no change in status, stable for surgery.  I have reviewed the patient's chart and labs.  Questions were answered to the patient's satisfaction.     Homero Fellers Evoleth Nordmeyer

## 2023-07-10 NOTE — Anesthesia Procedure Notes (Signed)
Spinal  Patient location during procedure: OR Start time: 07/10/2023 7:15 AM End time: 07/10/2023 7:20 AM Reason for block: surgical anesthesia Staffing Performed: anesthesiologist  Anesthesiologist: Mariann Barter, MD Performed by: Mariann Barter, MD Authorized by: Mariann Barter, MD   Preanesthetic Checklist Completed: patient identified, IV checked, site marked, risks and benefits discussed, surgical consent, monitors and equipment checked, pre-op evaluation and timeout performed Spinal Block Patient position: sitting Prep: DuraPrep Patient monitoring: heart rate, cardiac monitor, continuous pulse ox and blood pressure Approach: midline Location: L3-4 Injection technique: single-shot Needle Needle type: Sprotte  Needle gauge: 22 G Needle length: 9 cm Assessment Sensory level: T4 Events: CSF return

## 2023-07-10 NOTE — Progress Notes (Signed)
Orthopedic Tech Progress Note Patient Details:  Deanna Simmons [birth year] [id number]  CPM Left Knee Left Knee Flexion (Degrees): 10 Left Knee Extension (Degrees): 40  Post Interventions Patient Tolerated: Well  Tonye Pearson 07/10/2023, 9:27 AM

## 2023-07-10 NOTE — Progress Notes (Signed)
Orthopedic Tech Progress Note Patient Details:  Deanna Simmons [birth year] [id number]  CPM Left Knee CPM Left Knee: Off Left Knee Flexion (Degrees): 10 Left Knee Extension (Degrees): 40  Post Interventions Patient Tolerated: Well Came to take patient off of CPM and found that the patients foot strapping had been undone and the machine was not going. Patient sated someone stopped the machine for her to eat lunch, but no one had came to start the machine back once she was finished eating and she is unsure how long her leg had been sitting in the machine without it moving.  [name] Gerilyn Pilgrim 07/10/2023, 1:04 PM

## 2023-07-10 NOTE — Transfer of Care (Signed)
Immediate Anesthesia Transfer of Care Note  Patient: Deanna Simmons  Procedure(s) Performed: TOTAL KNEE ARTHROPLASTY (Left: Knee)  Patient Location: PACU  Anesthesia Type:Spinal  Level of Consciousness: drowsy and patient cooperative  Airway & Oxygen Therapy: Patient Spontanous Breathing and Patient connected to face mask oxygen  Post-op Assessment: Report given to RN and Post -op Vital signs reviewed and stable  Post vital signs: Reviewed and stable  Last Vitals:  Vitals Value Taken Time  BP 96/54 07/10/23 0850  Temp    Pulse 64 07/10/23 0852  Resp 15 07/10/23 0852  SpO2 95 % 07/10/23 0852  Vitals shown include unfiled device data.  Last Pain:  Vitals:   07/10/23 0555  TempSrc: Oral  PainSc: 0-No pain      Patients Stated Pain Goal: 4 (07/10/23 0555)  Complications: No notable events documented.

## 2023-07-10 NOTE — Evaluation (Signed)
Physical Therapy Evaluation Patient Details Name: Deanna Simmons MRN: [id number] DOB: Jun 14, 1951 Today's Date: 07/10/2023  History of Present Illness  72 yo female s/p L TKA on 07/10/23. PMH: CVA, lupus, RCR, heart cath, anemia  Clinical Impression  Pt is s/p TKA resulting in the deficits listed below (see PT Problem List).  Pt amb 25' with RW and  min assist, some difficulty with WBing LLE d/t pain. Anticipate steady progress and   pt will benefit from acute skilled PT to increase their independence and safety with mobility to allow discharge.          If plan is discharge home, recommend the following: A little help with bathing/dressing/bathroom;Help with stairs or ramp for entrance;Assistance with cooking/housework;Assist for transportation;A little help with walking and/or transfers   Can travel by private vehicle        Equipment Recommendations Rolling walker (2 wheels)  Recommendations for Other Services       Functional Status Assessment Patient has had a recent decline in their functional status and demonstrates the ability to make significant improvements in function in a reasonable and predictable amount of time.     Precautions / Restrictions Precautions Precautions: Knee;Fall Required Braces or Orthoses: Knee Immobilizer - Left Knee Immobilizer - Left: Discontinue once straight leg raise with < 10 degree lag Restrictions Other Position/Activity Restrictions: WBAT      Mobility  Bed Mobility Overal bed mobility: Needs Assistance Bed Mobility: Supine to Sit     Supine to sit: Min assist     General bed mobility comments: assist with LLE    Transfers Overall transfer level: Needs assistance Equipment used: Rolling walker (2 wheels) Transfers: Sit to/from Stand Sit to Stand: Min assist           General transfer comment: cues for hand placement and LLE position    Ambulation/Gait Ambulation/Gait assistance: Contact guard assist Gait Distance  (Feet): 35 Feet Assistive device: Rolling walker (2 wheels) Gait Pattern/deviations: Step-to pattern, Decreased stance time - left, Antalgic       General Gait Details: cues for sequence and RW position  Stairs            Wheelchair Mobility     Tilt Bed    Modified Rankin (Stroke Patients Only)       Balance                                             Pertinent Vitals/Pain Pain Assessment Pain Assessment: 0-10 Pain Location: right knee Pain Descriptors / Indicators: Aching, Grimacing, Guarding Pain Intervention(s): Limited activity within patient's tolerance, Monitored during session, Premedicated before session, Repositioned    Home Living Family/patient expects to be discharged to:: Private residence Living Arrangements: Spouse/significant other   Type of Home: House Home Access: Level entry   Entrance Stairs-Number of Steps: 1 small step Alternate Level Stairs-Number of Steps: stair lift Home Layout: Two level;Able to live on main level with bedroom/bathroom Home Equipment: Agricultural consultant (2 wheels);BSC/3in1      Prior Function Prior Level of Function : Independent/Modified Independent                     Extremity/Trunk Assessment   Upper Extremity Assessment Upper Extremity Assessment: Overall WFL for tasks assessed    Lower Extremity Assessment Lower Extremity Assessment: LLE deficits/detail LLE Deficits / Details: ankle WFL,  knee extension and hip flexion 2+/5       Communication   Communication Communication: No apparent difficulties  Cognition Arousal: Alert Behavior During Therapy: WFL for tasks assessed/performed Overall Cognitive Status: Within Functional Limits for tasks assessed                                          General Comments      Exercises Total Joint Exercises Ankle Circles/Pumps: AROM, 10 reps, Left   Assessment/Plan    PT Assessment Patient needs continued PT  services  PT Problem List Decreased strength;Decreased range of motion;Decreased activity tolerance;Decreased mobility;Decreased balance;Decreased knowledge of precautions;Pain;Decreased knowledge of use of DME       PT Treatment Interventions DME instruction;Gait training;Stair training;Functional mobility training;Therapeutic activities;Therapeutic exercise    PT Goals (Current goals can be found in the Care Plan section)  Acute Rehab PT Goals PT Goal Formulation: With patient Time For Goal Achievement: 07/17/23 Potential to Achieve Goals: Good    Frequency 7X/week     Co-evaluation               AM-PAC PT "6 Clicks" Mobility  Outcome Measure Help needed turning from your back to your side while in a flat bed without using bedrails?: A Little Help needed moving from lying on your back to sitting on the side of a flat bed without using bedrails?: A Little Help needed moving to and from a bed to a chair (including a wheelchair)?: A Little Help needed standing up from a chair using your arms (e.g., wheelchair or bedside chair)?: A Little Help needed to walk in hospital room?: A Little Help needed climbing 3-5 steps with a railing? : A Lot 6 Click Score: 17    End of Session Equipment Utilized During Treatment: Gait belt Activity Tolerance: Patient tolerated treatment well Patient left: with call bell/phone within reach;with chair alarm set;in chair Nurse Communication: Mobility status PT Visit Diagnosis: Other abnormalities of gait and mobility (R26.89)    Time: 1308-6578 PT Time Calculation (min) (ACUTE ONLY): 25 min   Charges:   PT Evaluation $PT Eval Low Complexity: 1 Low PT Treatments $Gait Training: 8-22 mins           Delice Bison, PT  Acute Rehab Dept ([facility]) [phone]  07/10/2023   [facility] 07/10/2023, 3:50 PM

## 2023-07-10 NOTE — Op Note (Signed)
OPERATIVE REPORT-TOTAL KNEE ARTHROPLASTY   Pre-operative diagnosis- Osteoarthritis  Left knee(s)  Post-operative diagnosis- Osteoarthritis Left knee(s)  Procedure-  Left  Total Knee Arthroplasty  Surgeon- Gus Rankin. Rilya Longo, MD  Assistant- Arcola Jansky, PA-C   Anesthesia-   Adductor canal block and spinal  EBL- 25 ml   Drains None  Tourniquet time-  Total Tourniquet Time Documented: Thigh (Left) - 32 minutes Total: Thigh (Left) - 32 minutes     Complications- None  Condition-PACU - hemodynamically stable.   Brief Clinical Note  Deanna Simmons is a 72 y.o. year old female with end stage OA of her left knee with progressively worsening pain and dysfunction. She has constant pain, with activity and at rest and significant functional deficits with difficulties even with ADLs. She has had extensive non-op management including analgesics, injections of cortisone and viscosupplements, and home exercise program, but remains in significant pain with significant dysfunction. Radiographs show bone on bone arthritis medial and patellofemoral. She presents now for left Total Knee Arthroplasty.     Procedure in detail---   The patient is brought into the operating room and positioned supine on the operating table. After successful administration of  Adductor canal block and spinal,   a tourniquet is placed high on the  Left thigh(s) and the lower extremity is prepped and draped in the usual sterile fashion. Time out is performed by the operating team and then the  Left lower extremity is wrapped in Esmarch, knee flexed and the tourniquet inflated to 300 mmHg.       A midline incision is made with a ten blade through the subcutaneous tissue to the level of the extensor mechanism. A fresh blade is used to make a medial parapatellar arthrotomy. Soft tissue over the proximal medial tibia is subperiosteally elevated to the joint line with a knife and into the semimembranosus bursa with a Cobb  elevator. Soft tissue over the proximal lateral tibia is elevated with attention being paid to avoiding the patellar tendon on the tibial tubercle. The patella is everted, knee flexed 90 degrees and the ACL and PCL are removed. Findings are bone on bone medial and patellofemoral with large global osteophytes        The drill is used to create a starting hole in the distal femur and the canal is thoroughly irrigated with sterile saline to remove the fatty contents. The 5 degree Left  valgus alignment guide is placed into the femoral canal and the distal femoral cutting block is pinned to remove 9 mm off the distal femur. Resection is made with an oscillating saw.      The tibia is subluxed forward and the menisci are removed. The extramedullary alignment guide is placed referencing proximally at the medial aspect of the tibial tubercle and distally along the second metatarsal axis and tibial crest. The block is pinned to remove 2mm off the more deficient medial  side. Resection is made with an oscillating saw. Size 4is the most appropriate size for the tibia and the proximal tibia is prepared with the modular drill and keel punch for that size.      The femoral sizing guide is placed and size 5 is most appropriate. Rotation is marked off the epicondylar axis and confirmed by creating a rectangular flexion gap at 90 degrees. The size 5 cutting block is pinned in this rotation and the anterior, posterior and chamfer cuts are made with the oscillating saw. The intercondylar block is then placed and that cut  is made.      Trial size 4 tibial component, trial size 5 posterior stabilized femur and a 7  mm posterior stabilized rotating platform insert trial is placed. Full extension is achieved with excellent varus/valgus and anterior/posterior balance throughout full range of motion. The patella is everted and thickness measured to be 22  mm. Free hand resection is taken to 12 mm, a 35 template is placed, lug holes are  drilled, trial patella is placed, and it tracks normally. Osteophytes are removed off the posterior femur with the trial in place. All trials are removed and the cut bone surfaces prepared with pulsatile lavage. Cement is mixed and once ready for implantation, the size 4 tibial implant, size  5 posterior stabilized femoral component, and the size 35 patella are cemented in place and the patella is held with the clamp. The trial insert is placed and the knee held in full extension. The Exparel (20 ml mixed with 60 ml saline) is injected into the extensor mechanism, posterior capsule, medial and lateral gutters and subcutaneous tissues.  All extruded cement is removed and once the cement is hard the permanent 7 mm posterior stabilized rotating platform insert is placed into the tibial tray.      The wound is copiously irrigated with saline solution and the extensor mechanism closed with # 0 Stratofix suture. The tourniquet is released for a total tourniquet time of 32  minutes. Flexion against gravity is 140 degrees and the patella tracks normally. Subcutaneous tissue is closed with 2.0 vicryl and subcuticular with running 4.0 Monocryl. The incision is cleaned and dried and steri-strips and a bulky sterile dressing are applied. The limb is placed into a knee immobilizer and the patient is awakened and transported to recovery in stable condition.      Please note that a surgical assistant was a medical necessity for this procedure in order to perform it in a safe and expeditious manner. Surgical assistant was necessary to retract the ligaments and vital neurovascular structures to prevent injury to them and also necessary for proper positioning of the limb to allow for anatomic placement of the prosthesis.   Gus Rankin Deanna Henne, MD    07/10/2023, 8:27 AM

## 2023-07-11 ENCOUNTER — Encounter (HOSPITAL_COMMUNITY): Payer: Self-pay | Admitting: Orthopedic Surgery

## 2023-07-11 ENCOUNTER — Other Ambulatory Visit (HOSPITAL_COMMUNITY): Payer: Self-pay

## 2023-07-11 DIAGNOSIS — M1712 Unilateral primary osteoarthritis, left knee: Secondary | ICD-10-CM | POA: Diagnosis not present

## 2023-07-11 LAB — CBC
HCT: 32 % — ABNORMAL LOW (ref 36.0–46.0)
Hemoglobin: 9.7 g/dL — ABNORMAL LOW (ref 12.0–15.0)
MCH: 27.7 pg (ref 26.0–34.0)
MCHC: 30.3 g/dL (ref 30.0–36.0)
MCV: 91.4 fL (ref 80.0–100.0)
Platelets: 162 10*3/uL (ref 150–400)
RBC: 3.5 MIL/uL — ABNORMAL LOW (ref 3.87–5.11)
RDW: 13.1 % (ref 11.5–15.5)
WBC: 11.8 10*3/uL — ABNORMAL HIGH (ref 4.0–10.5)
nRBC: 0 % (ref 0.0–0.2)

## 2023-07-11 LAB — BASIC METABOLIC PANEL
Anion gap: 6 (ref 5–15)
BUN: 11 mg/dL (ref 8–23)
CO2: 26 mmol/L (ref 22–32)
Calcium: 8.7 mg/dL — ABNORMAL LOW (ref 8.9–10.3)
Chloride: 103 mmol/L (ref 98–111)
Creatinine, Ser: 0.55 mg/dL (ref 0.44–1.00)
GFR, Estimated: >60 mL/min (ref >60)
Glucose, Bld: 141 mg/dL — ABNORMAL HIGH (ref 70–99)
Potassium: 3.9 mmol/L (ref 3.5–5.1)
Sodium: 135 mmol/L (ref 135–145)

## 2023-07-11 LAB — GLUCOSE, CAPILLARY
Glucose-Capillary: 118 mg/dL — ABNORMAL HIGH (ref 70–99)
Glucose-Capillary: 147 mg/dL — ABNORMAL HIGH (ref 70–99)
Glucose-Capillary: 142 mg/dL — ABNORMAL HIGH (ref 70–99)
Glucose-Capillary: 144 mg/dL — ABNORMAL HIGH (ref 70–99)

## 2023-07-11 MED ORDER — TRAMADOL HCL 50 MG PO TABS
50.0000 mg | ORAL_TABLET | Freq: Four times a day (QID) | ORAL | 0 refills | Status: DC | PRN
  Filled 2023-07-11: qty 40, 5d supply, fill #0

## 2023-07-11 MED ORDER — OXYCODONE HCL 5 MG PO TABS
5.0000 mg | ORAL_TABLET | Freq: Four times a day (QID) | ORAL | 0 refills | Status: DC | PRN
  Filled 2023-07-11: qty 42, 6d supply, fill #0

## 2023-07-11 MED ORDER — ONDANSETRON HCL 4 MG PO TABS
4.0000 mg | ORAL_TABLET | Freq: Four times a day (QID) | ORAL | 0 refills | Status: DC | PRN
  Filled 2023-07-11: qty 20, 5d supply, fill #0

## 2023-07-11 MED ORDER — ASPIRIN 325 MG PO TBEC
325.0000 mg | DELAYED_RELEASE_TABLET | Freq: Every day | ORAL | 0 refills | Status: AC
  Filled 2023-07-11: qty 20, 20d supply, fill #0

## 2023-07-11 MED ORDER — METHOCARBAMOL 500 MG PO TABS
500.0000 mg | ORAL_TABLET | Freq: Four times a day (QID) | ORAL | 0 refills | Status: DC | PRN
  Filled 2023-07-11: qty 40, 10d supply, fill #0

## 2023-07-11 MED ORDER — ASPIRIN 81 MG PO CHEW
325.0000 mg | CHEWABLE_TABLET | Freq: Every day | ORAL | Status: DC
  Administered 2023-07-11 – 2023-07-12 (×2): 325 mg via ORAL
  Filled 2023-07-11 (×2): qty 5

## 2023-07-11 NOTE — TOC Transition Note (Signed)
Transition of Care [facility]) - CM/SW Discharge Note   Patient Details  Name: Deanna Simmons MRN: [id number] Date of Birth: December 26, 1950  [facility]) CM/SW Contact:  Amada Jupiter, LCSW Phone Number: 07/11/2023, 10:00 AM   Clinical Narrative:     Met with pt who confirms that RW has already been ordered/ delivered to her home by [employer] CM.  OPPT already arranged with [facility].  No TOC needs.  Final next level of care: OP Rehab Barriers to Discharge: No Barriers Identified   Patient Goals and CMS Choice      Discharge Placement                         Discharge Plan and Services Additional resources added to the After Visit Summary for                  DME Arranged: N/A DME Agency: NA                  Social Determinants of Health (SDOH) Interventions SDOH Screenings   Food Insecurity: No Food Insecurity (07/10/2023)  Housing: Low Risk  (07/10/2023)  Transportation Needs: No Transportation Needs (07/10/2023)  Utilities: Not At Risk (07/10/2023)  Depression (PHQ2-9): Low Risk  (11/29/2021)  Tobacco Use: Medium Risk (07/10/2023)     Readmission Risk Interventions     No data to display

## 2023-07-11 NOTE — Plan of Care (Signed)

## 2023-07-11 NOTE — Progress Notes (Signed)
Physical Therapy Treatment Patient Details Name: Deanna Simmons MRN: [id number] DOB: [birth year] Today's Date: 07/11/2023   History of Present Illness 72 yo female s/p L TKA on 07/10/23. PMH: CVA, lupus, RCR, heart cath, anemia    PT Comments  Pt progressing however continues to require assist with all aspects of mobility as well as safety cues to prevent fall; will benefit from another day to work  with PT to maximize safety and independence (pt reports her husband is her primary caregiver and cannot physically assist at all.   If plan is discharge home, recommend the following: A little help with bathing/dressing/bathroom;Help with stairs or ramp for entrance;Assistance with cooking/housework;Assist for transportation;A little help with walking and/or transfers   Can travel by private vehicle        Equipment Recommendations  Rolling walker (2 wheels)    Recommendations for Other Services       Precautions / Restrictions Precautions Precautions: Knee;Fall Precaution Comments: improving quad activation, KI no utilized for gait Required Braces or Orthoses: Knee Immobilizer - Left Knee Immobilizer - Left: Discontinue once straight leg raise with < 10 degree lag Restrictions Weight Bearing Restrictions: No Other Position/Activity Restrictions: WBAT     Mobility  Bed Mobility Overal bed mobility: Needs Assistance Bed Mobility: Supine to Sit, Sit to Supine     Supine to sit: Min assist, Contact guard Sit to supine: Contact guard assist, Min assist   General bed mobility comments: leg lifter used to self assist with LLE on and off bed, incr time and effort    Transfers Overall transfer level: Needs assistance Equipment used: Rolling walker (2 wheels) Transfers: Sit to/from Stand Sit to Stand: Min assist, From elevated surface, Contact guard assist           General transfer comment: cues for hand placement and LLE position, overall safety     Ambulation/Gait Ambulation/Gait assistance: Contact guard assist Gait Distance (Feet): 15 Feet (x2) Assistive device: Rolling walker (2 wheels) Gait Pattern/deviations: Step-to pattern, Decreased stance time - left, Antalgic       General Gait Details: cues for sequence and RW position. improved wt shift to LLE end of distance   Stairs             Wheelchair Mobility     Tilt Bed    Modified Rankin (Stroke Patients Only)       Balance                                            Cognition Arousal: Alert Behavior During Therapy: WFL for tasks assessed/performed Overall Cognitive Status: Within Functional Limits for tasks assessed                                          Exercises Total Joint Exercises Ankle Circles/Pumps: AROM, 10 reps, Left Quad Sets: AROM, Both, 10 reps Heel Slides: AAROM, Left, 10 reps Hip ABduction/ADduction: Left, 10 reps, AAROM Straight Leg Raises: Left, 10 reps, AAROM    General Comments        Pertinent Vitals/Pain Pain Assessment Pain Assessment: 0-10 Pain Score: 6  Pain Location: right knee Pain Descriptors / Indicators: Aching, Grimacing, Guarding Pain Intervention(s): Limited activity within patient's tolerance, Premedicated before session, Monitored during session, Repositioned, Ice applied, Heat  applied (ice to knee, heat to thigh)    Home Living                          Prior Function            PT Goals (current goals can now be found in the care plan section) Acute Rehab PT Goals PT Goal Formulation: With patient Time For Goal Achievement: 07/17/23 Potential to Achieve Goals: Good Progress towards PT goals: Progressing toward goals    Frequency    7X/week      PT Plan      Co-evaluation              AM-PAC PT "6 Clicks" Mobility   Outcome Measure  Help needed turning from your back to your side while in a flat bed without using bedrails?: A  Little Help needed moving from lying on your back to sitting on the side of a flat bed without using bedrails?: A Little Help needed moving to and from a bed to a chair (including a wheelchair)?: A Little Help needed standing up from a chair using your arms (e.g., wheelchair or bedside chair)?: A Little Help needed to walk in hospital room?: A Little Help needed climbing 3-5 steps with a railing? : A Lot 6 Click Score: 17    End of Session Equipment Utilized During Treatment: Gait belt Activity Tolerance: Patient tolerated treatment well Patient left: with call bell/phone within reach;in bed;with bed alarm set Nurse Communication: Mobility status PT Visit Diagnosis: Other abnormalities of gait and mobility (R26.89)     Time: 4098-1191 PT Time Calculation (min) (ACUTE ONLY): 29 min  Charges:    $Gait Training: 8-22 mins $Therapeutic Exercise: 8-22 mins PT General Charges $$ ACUTE PT VISIT: 1 Visit                     Delice Bison, PT  Acute Rehab Dept [facility]) [phone]  07/11/2023    [facility] 07/11/2023, 3:51 PM

## 2023-07-11 NOTE — Progress Notes (Signed)
Subjective: 1 Day Post-Op Procedure(s) (LRB): TOTAL KNEE ARTHROPLASTY (Left) Patient reports pain as mild.   Patient seen in rounds by Dr. Lequita Halt. Patient is well, and has had no acute complaints or problems No issues overnight. Denies chest pain, SOB, or calf pain. Foley catheter removed this AM.  We will continue therapy today, ambulated 25' yesterday.   Objective: Vital signs in last 24 hours: Temp:  [96.7 F (35.9 C)-98.3 F (36.8 C)] 98.1 F (36.7 C) (09/10 0636) Pulse Rate:  [61-82] 77 (09/10 0636) Resp:  [10-21] 14 (09/10 0636) BP: (95-169)/(58-85) 155/84 (09/10 0636) SpO2:  [95 %-100 %] 97 % (09/10 0757)  Intake/Output from previous day:  Intake/Output Summary (Last 24 hours) at 07/11/2023 0847 Last data filed at 07/11/2023 0600 Gross per 24 hour  Intake 1563.33 ml  Output 2300 ml  Net -736.67 ml     Intake/Output this shift: No intake/output data recorded.  Labs: Recent Labs    07/11/23 0339  HGB 9.7*   Recent Labs    07/11/23 0339  WBC 11.8*  RBC 3.50*  HCT 32.0*  PLT 162   Recent Labs    07/11/23 0339  NA 135  K 3.9  CL 103  CO2 26  BUN 11  CREATININE 0.55  GLUCOSE 141*  CALCIUM 8.7*   No results for input(s): "LABPT", "INR" in the last 72 hours.  Exam: General - Patient is Alert and Oriented Extremity - Neurologically intact Neurovascular intact Sensation intact distally Dorsiflexion/Plantar flexion intact Dressing - dressing C/D/I Motor Function - intact, moving foot and toes well on exam.   Past Medical History:  Diagnosis Date   Abnormal finding on Pap smear, ASCUS    Anemia    Asthma    Chondromalacia of knee    Coronary artery disease    Diabetes mellitus without complication (HCC)    Diverticulum 12/11/2002   in cecum   Fibroids    h/o   GERD (gastroesophageal reflux disease)    H/O tinea cruris 02/28/2010   Heart murmur    History of CVA (cerebrovascular accident)    Hypercholesteremia    Hypertension     Lupus (HCC)    Obesity    Pneumonia 07/01/2008   S/P TAVR (transcatheter aortic valve replacement) 07/13/2021   s/p TAVR w/ a 23mm Edwards S3U via the TF approach by Dr. Excell Seltzer and Dr. Laneta Simmers.   Synovitis of knee    Systemic lupus erythematosus (HCC)    with nephritis   Vulvitis    h/o    Assessment/Plan: 1 Day Post-Op Procedure(s) (LRB): TOTAL KNEE ARTHROPLASTY (Left) Principal Problem:   OA (osteoarthritis) of knee  Estimated body mass index is 34.67 kg/m as calculated from the following:   Height as of this encounter: 5\' 4"  (1.626 m).   Weight as of this encounter: 91.6 kg. Advance diet Up with therapy D/C IV fluids   Patient's anticipated LOS is less than 2 midnights, meeting these requirements: - Lives within 1 hour of care - Has a competent adult at home to recover with post-op recover - NO history of  - Chronic pain requiring opioids  - Diabetes  - Coronary Artery Disease  - Heart failure  - Heart attack  - Stroke  - DVT/VTE  - Respiratory Failure/COPD  - Renal failure  - Anemia  - Advanced Liver disease  DVT Prophylaxis - Aspirin and Plavix Weight bearing as tolerated. Continue therapy.  Plan is to go Home after hospital stay. Possible discharge later  today if progresses with therapy and meeting goals. Scheduled for OPPT at [facility]. Follow-up in the office in 2 weeks.  The PDMP database was reviewed today prior to any opioid medications being prescribed to this patient.  Arther Abbott, PA-C Orthopedic Surgery [phone] 07/11/2023, 8:47 AM

## 2023-07-11 NOTE — Progress Notes (Signed)
Physical Therapy Treatment Patient Details Name: Deanna Simmons MRN: [id number] DOB: 1951/01/28 Today's Date: 07/11/2023   History of Present Illness 72 yo female s/p L TKA on 07/10/23. PMH: CVA, lupus, RCR, heart cath, anemia    PT Comments  Pt progressing toward goals, incr gait distance/tolerance however still having some issues with pain control. Will see how pt progresses this pm, may need another day to work with PT    If plan is discharge home, recommend the following: A little help with bathing/dressing/bathroom;Help with stairs or ramp for entrance;Assistance with cooking/housework;Assist for transportation;A little help with walking and/or transfers   Can travel by private vehicle        Equipment Recommendations  Rolling walker (2 wheels)    Recommendations for Other Services       Precautions / Restrictions Precautions Precautions: Knee;Fall Precaution Comments: improving quad activation, KI no utilized for gait Required Braces or Orthoses: Knee Immobilizer - Left Knee Immobilizer - Left: Discontinue once straight leg raise with < 10 degree lag Restrictions Weight Bearing Restrictions: No Other Position/Activity Restrictions: WBAT     Mobility  Bed Mobility Overal bed mobility: Needs Assistance Bed Mobility: Supine to Sit     Supine to sit: Min assist     General bed mobility comments: assist with LLE, incr time and effort    Transfers Overall transfer level: Needs assistance Equipment used: Rolling walker (2 wheels) Transfers: Sit to/from Stand Sit to Stand: Min assist, From elevated surface           General transfer comment: cues for hand placement and LLE position    Ambulation/Gait Ambulation/Gait assistance: Contact guard assist Gait Distance (Feet): 50 Feet Assistive device: Rolling walker (2 wheels) Gait Pattern/deviations: Step-to pattern, Decreased stance time - left, Antalgic       General Gait Details: cues for sequence and RW  position. improved wt shift to LLE end of distance   Stairs             Wheelchair Mobility     Tilt Bed    Modified Rankin (Stroke Patients Only)       Balance                                            Cognition Arousal: Alert Behavior During Therapy: WFL for tasks assessed/performed Overall Cognitive Status: Within Functional Limits for tasks assessed                                          Exercises Total Joint Exercises Ankle Circles/Pumps: AROM, 10 reps, Left Quad Sets: AROM, Both, 10 reps    General Comments        Pertinent Vitals/Pain Pain Assessment Pain Assessment: 0-10 Pain Score: 5  Pain Location: right knee Pain Descriptors / Indicators: Aching, Grimacing, Guarding Pain Intervention(s): Limited activity within patient's tolerance, Monitored during session, Premedicated before session, Repositioned, Ice applied, Heat applied (ice to knee, heat to upper thigh)    Home Living                          Prior Function            PT Goals (current goals can now be found in the care plan  section) Acute Rehab PT Goals PT Goal Formulation: With patient Time For Goal Achievement: 07/17/23 Potential to Achieve Goals: Good Progress towards PT goals: Progressing toward goals    Frequency    7X/week      PT Plan      Co-evaluation              AM-PAC PT "6 Clicks" Mobility   Outcome Measure  Help needed turning from your back to your side while in a flat bed without using bedrails?: A Little Help needed moving from lying on your back to sitting on the side of a flat bed without using bedrails?: A Little Help needed moving to and from a bed to a chair (including a wheelchair)?: A Little Help needed standing up from a chair using your arms (e.g., wheelchair or bedside chair)?: A Little Help needed to walk in hospital room?: A Little Help needed climbing 3-5 steps with a railing? : A  Lot 6 Click Score: 17    End of Session Equipment Utilized During Treatment: Gait belt Activity Tolerance: Patient tolerated treatment well Patient left: with call bell/phone within reach;with chair alarm set;in chair Nurse Communication: Mobility status PT Visit Diagnosis: Other abnormalities of gait and mobility (R26.89)     Time: 1000-1026 PT Time Calculation (min) (ACUTE ONLY): 26 min  Charges:    $Gait Training: 8-22 mins $Therapeutic Activity: 8-22 mins PT General Charges $$ ACUTE PT VISIT: 1 Visit                     Kealani Leckey, PT  Acute Rehab Dept [facility]) [phone]  07/11/2023    [facility] 07/11/2023, 10:39 AM

## 2023-07-12 ENCOUNTER — Other Ambulatory Visit (HOSPITAL_COMMUNITY): Payer: Self-pay

## 2023-07-12 DIAGNOSIS — M1712 Unilateral primary osteoarthritis, left knee: Secondary | ICD-10-CM | POA: Diagnosis present

## 2023-07-12 LAB — CBC
HCT: 31.4 % — ABNORMAL LOW (ref 36.0–46.0)
Hemoglobin: 9.7 g/dL — ABNORMAL LOW (ref 12.0–15.0)
MCH: 28.1 pg (ref 26.0–34.0)
MCHC: 30.9 g/dL (ref 30.0–36.0)
MCV: 91 fL (ref 80.0–100.0)
Platelets: 167 10*3/uL (ref 150–400)
RBC: 3.45 MIL/uL — ABNORMAL LOW (ref 3.87–5.11)
RDW: 13.1 % (ref 11.5–15.5)
WBC: 12.1 10*3/uL — ABNORMAL HIGH (ref 4.0–10.5)
nRBC: 0 % (ref 0.0–0.2)

## 2023-07-12 LAB — GLUCOSE, CAPILLARY
Glucose-Capillary: 125 mg/dL — ABNORMAL HIGH (ref 70–99)
Glucose-Capillary: 109 mg/dL — ABNORMAL HIGH (ref 70–99)
Glucose-Capillary: 111 mg/dL — ABNORMAL HIGH (ref 70–99)

## 2023-07-12 NOTE — Progress Notes (Signed)
2 spots of drainage noted to compression dressing, outer aspect of left knee and proximal end of Aquacel dressing, reinforced with gauze/ABD/ and ace wrap, ice applied, will monitor.

## 2023-07-12 NOTE — Progress Notes (Signed)
   Subjective: 2 Days Post-Op Procedure(s) (LRB): TOTAL KNEE ARTHROPLASTY (Left) Patient seen in rounds by Dr. Lequita Halt. Patient is well, and has had no acute complaints or problems. Denies SOB or chest pain. Denies calf pain. Patient reports pain as moderate.  Worked with physical therapy yesterday and ambulated 15' (x 2). Will continue physical therapy today.  Objective: Vital signs in last 24 hours: Temp:  [98.1 F (36.7 C)-98.7 F (37.1 C)] 98.7 F (37.1 C) (09/11 0558) Pulse Rate:  [72-81] 81 (09/11 0558) Resp:  [16-17] 17 (09/11 0558) BP: (113-161)/(64-78) 113/64 (09/11 0558) SpO2:  [96 %-99 %] 97 % (09/11 0558)  Intake/Output from previous day:  Intake/Output Summary (Last 24 hours) at 07/12/2023 0755 Last data filed at 07/12/2023 0200 Gross per 24 hour  Intake 600 ml  Output 2200 ml  Net -1600 ml    Intake/Output this shift: No intake/output data recorded.  Labs: Recent Labs    07/11/23 0339 07/12/23 0318  HGB 9.7* 9.7*   Recent Labs    07/11/23 0339 07/12/23 0318  WBC 11.8* 12.1*  RBC 3.50* 3.45*  HCT 32.0* 31.4*  PLT 162 167   Recent Labs    07/11/23 0339  NA 135  K 3.9  CL 103  CO2 26  BUN 11  CREATININE 0.55  GLUCOSE 141*  CALCIUM 8.7*   No results for input(s): "LABPT", "INR" in the last 72 hours.  Exam: General - Patient is Alert and Oriented Extremity - Neurologically intact Neurovascular intact Sensation intact distally Dorsiflexion/Plantar flexion intact Dressing/Incision - drainage noted at proximal Aquacel Motor Function - intact, moving foot and toes well on exam.  Past Medical History:  Diagnosis Date   Abnormal finding on Pap smear, ASCUS    Anemia    Asthma    Chondromalacia of knee    Coronary artery disease    Diabetes mellitus without complication (HCC)    Diverticulum 12/11/2002   in cecum   Fibroids    h/o   GERD (gastroesophageal reflux disease)    H/O tinea cruris 02/28/2010   Heart murmur    History of  CVA (cerebrovascular accident)    Hypercholesteremia    Hypertension    Lupus (HCC)    Obesity    Pneumonia 07/01/2008   S/P TAVR (transcatheter aortic valve replacement) 07/13/2021   s/p TAVR w/ a 23mm Edwards S3U via the TF approach by Dr. Excell Seltzer and Dr. Laneta Simmers.   Synovitis of knee    Systemic lupus erythematosus (HCC)    with nephritis   Vulvitis    h/o    Assessment/Plan: 2 Days Post-Op Procedure(s) (LRB): TOTAL KNEE ARTHROPLASTY (Left) Principal Problem:   OA (osteoarthritis) of knee  Estimated body mass index is 34.67 kg/m as calculated from the following:   Height as of this encounter: 5\' 4"  (1.626 m).   Weight as of this encounter: 91.6 kg.   DVT Prophylaxis - Aspirin and Plavix Weight-bearing as tolerated.  Continue with physical therapy. Expected discharge home today if meeting goals. Scheduled for OPPT at [facility]. Follow-up in clinic in 2 weeks.  Alfonzo Feller, PA-C Orthopedic Surgery 07/12/2023, 7:55 AM

## 2023-07-12 NOTE — Progress Notes (Signed)
Physical Therapy Treatment Patient Details Name: Deanna Simmons MRN: [id number] DOB: 08-13-1951 Today's Date: 07/12/2023   History of Present Illness 72 yo female s/p L TKA on 07/10/23. PMH: CVA, lupus, RCR, heart cath, anemia    PT Comments  Pt progressing well this session, meeting goals; much improved activity tolerance and incr gait distance. Ready to d/c home with family assist prn.   If plan is discharge home, recommend the following: A little help with bathing/dressing/bathroom;Help with stairs or ramp for entrance;Assistance with cooking/housework;Assist for transportation;A little help with walking and/or transfers   Can travel by private vehicle        Equipment Recommendations  Rolling walker (2 wheels)    Recommendations for Other Services       Precautions / Restrictions Precautions Precautions: Knee;Fall Restrictions LLE Weight Bearing: Weight bearing as tolerated     Mobility  Bed Mobility Overal bed mobility: Needs Assistance Bed Mobility: Supine to Sit     Supine to sit: Supervision     General bed mobility comments: leg lifter used to self assist with LLE on and off bed, incr time; no physial assist    Transfers Overall transfer level: Needs assistance Equipment used: Rolling walker (2 wheels) Transfers: Sit to/from Stand Sit to Stand: Supervision           General transfer comment: cues for hand placement and LLE position, overall safety    Ambulation/Gait Ambulation/Gait assistance: Supervision Gait Distance (Feet): 60 Feet (10' more) Assistive device: Rolling walker (2 wheels) Gait Pattern/deviations: Step-to pattern, Step-through pattern, Decreased stride length       General Gait Details: cues for sequence and RW position. improved wt shift to LLE end of distance; pt demonstratign carryover from previous session regarding safety and RW position   Stairs Stairs: Yes Stairs assistance: Contact guard assist Stair Management: No  rails, Step to pattern, Forwards, With walker Number of Stairs: 1 General stair comments: cues for sequence and tehcnique; good stability no knee buckling   Wheelchair Mobility     Tilt Bed    Modified Rankin (Stroke Patients Only)       Balance                                            Cognition Arousal: Alert Behavior During Therapy: WFL for tasks assessed/performed Overall Cognitive Status: Within Functional Limits for tasks assessed                                          Exercises Total Joint Exercises Ankle Circles/Pumps: AROM, 10 reps, Left Quad Sets: AROM, Both, 5 reps Heel Slides: AAROM, Left, 10 reps Straight Leg Raises: AROM, AAROM, Left, 10 reps    General Comments        Pertinent Vitals/Pain Pain Assessment Pain Assessment: 0-10 Pain Score: 4  Pain Location: right knee Pain Descriptors / Indicators: Sore Pain Intervention(s): Limited activity within patient's tolerance, Monitored during session, Premedicated before session, Repositioned, Ice applied    Home Living                          Prior Function            PT Goals (current goals can now be found in  the care plan section) Acute Rehab PT Goals PT Goal Formulation: With patient Time For Goal Achievement: 07/17/23 Potential to Achieve Goals: Good Progress towards PT goals: Progressing toward goals    Frequency    7X/week      PT Plan      Co-evaluation              AM-PAC PT "6 Clicks" Mobility   Outcome Measure  Help needed turning from your back to your side while in a flat bed without using bedrails?: A Little Help needed moving from lying on your back to sitting on the side of a flat bed without using bedrails?: A Little Help needed moving to and from a bed to a chair (including a wheelchair)?: A Little Help needed standing up from a chair using your arms (e.g., wheelchair or bedside chair)?: A Little Help needed  to walk in hospital room?: A Little Help needed climbing 3-5 steps with a railing? : A Little 6 Click Score: 18    End of Session Equipment Utilized During Treatment: Gait belt Activity Tolerance: Patient tolerated treatment well Patient left: in chair;with call bell/phone within reach;with chair alarm set Nurse Communication: Mobility status PT Visit Diagnosis: Other abnormalities of gait and mobility (R26.89)     Time: 1610-9604 PT Time Calculation (min) (ACUTE ONLY): 20 min  Charges:    $Gait Training: 8-22 mins PT General Charges $$ ACUTE PT VISIT: 1 Visit                     Omare Bilotta, PT  Acute Rehab Dept [facility]) [phone]  07/12/2023    [facility] 07/12/2023, 1:38 PM

## 2023-07-12 NOTE — Progress Notes (Signed)
Discharge meds from [facility] outpatient pharmacy delivered to primary nurse on [address]

## 2023-07-12 NOTE — Plan of Care (Signed)
  Problem: Coping: Goal: Ability to adjust to condition or change in health will improve Outcome: Progressing   Problem: Pain Management: Goal: Pain level will decrease with appropriate interventions Outcome: Progressing   

## 2023-07-18 NOTE — Discharge Summary (Signed)
Patient ID: LIZZIE GITCHELL MRN: [id number] DOB/AGE: 30-Sep-1951 72 y.o.  Admit date: 07/10/2023 Discharge date: 07/12/2023  Admission Diagnoses:  Principal Problem:   OA (osteoarthritis) of knee Active Problems:   Osteoarthritis of left knee   Discharge Diagnoses:  Same  Past Medical History:  Diagnosis Date   Abnormal finding on Pap smear, ASCUS    Anemia    Asthma    Chondromalacia of knee    Coronary artery disease    Diabetes mellitus without complication (HCC)    Diverticulum 12/11/2002   in cecum   Fibroids    h/o   GERD (gastroesophageal reflux disease)    H/O tinea cruris 02/28/2010   Heart murmur    History of CVA (cerebrovascular accident)    Hypercholesteremia    Hypertension    Lupus (HCC)    Obesity    Pneumonia 07/01/2008   S/P TAVR (transcatheter aortic valve replacement) 07/13/2021   s/p TAVR w/ a 23mm Edwards S3U via the TF approach by Dr. Excell Seltzer and Dr. Laneta Simmers.   Synovitis of knee    Systemic lupus erythematosus (HCC)    with nephritis   Vulvitis    h/o    Surgeries: Procedure(s): TOTAL KNEE ARTHROPLASTY on 07/10/2023   Consultants:   Discharged Condition: Improved  Hospital Course: MICAILA WEINSTOCK is an 72 y.o. female who was admitted 07/10/2023 for operative treatment ofOA (osteoarthritis) of knee. Patient has severe unremitting pain that affects sleep, daily activities, and work/hobbies. After pre-op clearance the patient was taken to the operating room on 07/10/2023 and underwent  Procedure(s): TOTAL KNEE ARTHROPLASTY.    Patient was given perioperative antibiotics:  Anti-infectives (From admission, onward)    Start     Dose/Rate Route Frequency Ordered Stop   07/10/23 1300  ceFAZolin (ANCEF) IVPB 2g/100 mL premix        2 g 200 mL/hr over 30 Minutes Intravenous Every 6 hours 07/10/23 1120 07/11/23 1123   07/10/23 0600  ceFAZolin (ANCEF) IVPB 2g/100 mL premix        2 g 200 mL/hr over 30 Minutes Intravenous On call to O.R. 07/10/23 0536  07/10/23 0720   07/10/23 0545  vancomycin (VANCOCIN) IVPB 1000 mg/200 mL premix        1,000 mg 200 mL/hr over 60 Minutes Intravenous  Once 07/10/23 0536 07/10/23 0805        Patient was given sequential compression devices, early ambulation, and chemoprophylaxis to prevent DVT.  Patient benefited maximally from hospital stay and there were no complications.    Recent vital signs: No data found.   Recent laboratory studies: No results for input(s): "WBC", "HGB", "HCT", "PLT", "NA", "K", "CL", "CO2", "BUN", "CREATININE", "GLUCOSE", "INR", "CALCIUM" in the last 72 hours.  Invalid input(s): "PT", "2"   Discharge Medications:   Allergies as of 07/12/2023       Reactions   Cardiolite [technetium-70m] Itching   Ciprofloxacin Itching   Clarithromycin Itching   Erythromycin Base Rash   Iodinated Contrast Media Itching   Pt has a potential contrast allergy; She states in 2012 her cardiologist told her she had a reaction during the "study". We are unable to determine the study or confirm a reaction. The patient will have a 13 hr prep for future studies. Molecular Imaging states their injection does not cause any reactions.   Niacin Itching   Niaspan [niacin Er] Other (See Comments)   flushing   Other Itching   Chemical Use And Stress Cardiolite Testing (Uncoded)  Penicillins Hives, Itching, Rash   Reaction: over 10 years Tolerated Cephalosporin Date: 07/11/23.   Prilosec [omeprazole] Itching   Singulair [montelukast] Hives, Itching   Ticlid [ticlopidine Hcl] Itching        Medication List     STOP taking these medications    doxycycline 100 MG capsule Commonly known as: MONODOX   fluconazole 150 MG tablet Commonly known as: DIFLUCAN   Lagevrio 200 MG Caps capsule Generic drug: molnupiravir EUA       TAKE these medications    acetaminophen 325 MG tablet Commonly known as: TYLENOL Take 650 mg by mouth every 6 (six) hours as needed for moderate pain.   albuterol  108 (90 Base) MCG/ACT inhaler Commonly known as: Ventolin HFA Inhale 2 puffs into the lungs every 4 to 6 hours as needed for wheezing, coughing or shortness of breath.   Arnuity Ellipta 200 MCG/ACT Aepb Generic drug: Fluticasone Furoate Inhale 1 puff into the lungs daily. Use during asthma flares as directed. What changed:  when to take this reasons to take this   ascorbic acid 500 MG tablet Commonly known as: VITAMIN C Take 500 mg by mouth daily.   aspirin EC 325 MG tablet Take 1 tablet (325 mg total) by mouth daily for 20 days. Then take one 81 mg aspirin once a day for three weeks. Then discontinue aspirin.   azithromycin 500 MG tablet Commonly known as: Zithromax Take one tablet by mouth1 hour before any dental work including cleanings.   azithromycin 500 MG tablet Commonly known as: ZITHROMAX Take 1 tablet by mouth 1 hour prior to dental treatment.   beclomethasone 80 MCG/ACT inhaler Commonly known as: QVAR Inhale 1 puff into the lungs daily as needed (asthma).   cetirizine 10 MG tablet Commonly known as: ZYRTEC Take 1 tablet (10 mg total) by mouth daily as needed for allergies (Can use an extra dose during flares). What changed: when to take this   chlorhexidine 4 % external liquid Commonly known as: HIBICLENS Apply 15 mLs (1 Application total) topically as directed for 30 doses. Use as directed daily for 5 days every other week for 6 weeks.   clopidogrel 75 MG tablet Commonly known as: PLAVIX Take 1 tablet (75 mg total) by mouth daily.   Estradiol 10 MCG Tabs vaginal tablet Place 1 tablet (10 mcg total) vaginally 2 (two) times a week as directed. What changed: when to take this   famotidine 40 MG tablet Commonly known as: PEPCID Take 1 tablet (40 mg total) by mouth in the morning.   Farxiga 10 MG Tabs tablet Generic drug: dapagliflozin propanediol Take 1 tablet by mouth daily   ferrous sulfate 325 (65 FE) MG tablet Take 325 mg by mouth daily.    fluticasone 50 MCG/ACT nasal spray Commonly known as: FLONASE Place 1 spray into both nostrils daily.   fluticasone-salmeterol 250-50 MCG/ACT Aepb Commonly known as: ADVAIR Inhale 1 puff into the lungs in the morning and at bedtime. What changed: when to take this   glucose blood test strip Use to test blood glucose twice daily before meals.   ketoconazole 2 % cream Commonly known as: NIZORAL Apply to affected area twice a day for skin fungus infection for 14 days What changed:  how much to take when to take this reasons to take this   methocarbamol 500 MG tablet Commonly known as: ROBAXIN Take 1 tablet (500 mg total) by mouth every 6 (six) hours as needed for muscle spasms.  metoprolol succinate 25 MG 24 hr tablet Commonly known as: TOPROL-XL Take 1 tablet (25 mg total) by mouth daily.   MULTIVITAL PO Take 1 tablet by mouth daily.   mupirocin ointment 2 % Commonly known as: BACTROBAN Place 1 Application into the nose 2 (two) times daily for 60 doses. Use as directed 2 times daily for 5 days every other week for 6 weeks. What changed:  how much to take how to take this when to take this additional instructions   nitroGLYCERIN 0.4 MG SL tablet Commonly known as: NITROSTAT Place 0.4 mg under the tongue every 5 (five) minutes as needed for chest pain.   nystatin powder Commonly known as: MYCOSTATIN/NYSTOP Apply to affected area 2 times a day as needed for 7 days What changed:  how much to take how to take this when to take this reasons to take this   ondansetron 4 MG tablet Commonly known as: ZOFRAN Take 1 tablet (4 mg total) by mouth every 6 (six) hours as needed for nausea.   oxyCODONE 5 MG immediate release tablet Commonly known as: Oxy IR/ROXICODONE Take 1-2 tablets (5-10 mg total) by mouth every 6 (six) hours as needed for severe pain.   PROBIOTIC DAILY PO Take 1 capsule by mouth at bedtime. Ultra Flora IB   Repatha SureClick 140 MG/ML  Soaj Generic drug: Evolocumab Inject 140 mg into the skin every 14 (fourteen) days.   rosuvastatin 40 MG tablet Commonly known as: CRESTOR Take 1 tablet (40 mg total) by mouth daily.   Spiriva Respimat 1.25 MCG/ACT Aers Generic drug: Tiotropium Bromide Monohydrate Take 2 Inhalations by mouth daily as needed.   tiZANidine 4 MG tablet Commonly known as: ZANAFLEX Take 1 tablet (4 mg total) by mouth 3 (three) times daily as needed for muscle spasm   traMADol 50 MG tablet Commonly known as: ULTRAM Take 1-2 tablets (50-100 mg total) by mouth every 6 (six) hours as needed for moderate pain.   triamcinolone ointment 0.1 % Commonly known as: KENALOG Apply once a daily in the evening for 2-4 weeks What changed:  how much to take when to take this reasons to take this   valACYclovir 500 MG tablet Commonly known as: VALTREX TAKE 1 TABLET BY MOUTH TWICE DAILY AS NEEDED   VITAMIN K2-VITAMIN D3 PO Take 1 tablet by mouth daily.   zinc gluconate 50 MG tablet Take 50 mg by mouth every evening.               Discharge Care Instructions  (From admission, onward)           Start     Ordered   07/11/23 0000  Weight bearing as tolerated        07/11/23 0853   07/11/23 0000  Change dressing       Comments: You may remove the bulky bandage (ACE wrap and gauze) two days after surgery. You will have an adhesive waterproof bandage underneath. Leave this in place until your first follow-up appointment.   07/11/23 0853            Diagnostic Studies: No results found.  Disposition: Discharge disposition: 01-Home or Self Care       Discharge Instructions     Call MD / Call 911   Complete by: As directed    If you experience chest pain or shortness of breath, CALL 911 and be transported to the hospital emergency room.  If you develope a fever above 101 F, pus (white  drainage) or increased drainage or redness at the wound, or calf pain, call your surgeon's office.    Change dressing   Complete by: As directed    You may remove the bulky bandage (ACE wrap and gauze) two days after surgery. You will have an adhesive waterproof bandage underneath. Leave this in place until your first follow-up appointment.   Constipation Prevention   Complete by: As directed    Drink plenty of fluids.  Prune juice may be helpful.  You may use a stool softener, such as Colace (over the counter) 100 mg twice a day.  Use MiraLax (over the counter) for constipation as needed.   Diet - low sodium heart healthy   Complete by: As directed    Do not put a pillow under the knee. Place it under the heel.   Complete by: As directed    Driving restrictions   Complete by: As directed    No driving for two weeks   Post-operative opioid taper instructions:   Complete by: As directed    POST-OPERATIVE OPIOID TAPER INSTRUCTIONS: It is important to wean off of your opioid medication as soon as possible. If you do not need pain medication after your surgery it is ok to stop day one. Opioids include: Codeine, Hydrocodone(Norco, Vicodin), Oxycodone(Percocet, oxycontin) and hydromorphone amongst others.  Long term and even short term use of opiods can cause: Increased pain response Dependence Constipation Depression Respiratory depression And more.  Withdrawal symptoms can include Flu like symptoms Nausea, vomiting And more Techniques to manage these symptoms Hydrate well Eat regular healthy meals Stay active Use relaxation techniques(deep breathing, meditating, yoga) Do Not substitute Alcohol to help with tapering If you have been on opioids for less than two weeks and do not have pain than it is ok to stop all together.  Plan to wean off of opioids This plan should start within one week post op of your joint replacement. Maintain the same interval or time between taking each dose and first decrease the dose.  Cut the total daily intake of opioids by one tablet each day Next  start to increase the time between doses. The last dose that should be eliminated is the evening dose.      TED hose   Complete by: As directed    Use stockings (TED hose) for three weeks on both leg(s).  You may remove them at night for sleeping.   Weight bearing as tolerated   Complete by: As directed         Follow-up Information     Aluisio, Homero Fellers, MD Follow up in 2 week(s).   Specialty: Orthopedic Surgery Contact information: [address] [place] [address] [phone]                  Signed: Arther Abbott 07/18/2023, 8:42 AM

## 2023-07-26 DIAGNOSIS — M25662 Stiffness of left knee, not elsewhere classified: Secondary | ICD-10-CM | POA: Diagnosis not present

## 2023-07-31 ENCOUNTER — Other Ambulatory Visit: Payer: Self-pay | Admitting: Cardiovascular Disease

## 2023-07-31 ENCOUNTER — Other Ambulatory Visit (HOSPITAL_COMMUNITY): Payer: Self-pay

## 2023-07-31 ENCOUNTER — Ambulatory Visit: Payer: Commercial Managed Care - PPO | Admitting: Cardiovascular Disease

## 2023-08-01 ENCOUNTER — Other Ambulatory Visit (HOSPITAL_COMMUNITY): Payer: Self-pay

## 2023-08-02 ENCOUNTER — Other Ambulatory Visit (HOSPITAL_COMMUNITY): Payer: Self-pay

## 2023-08-03 ENCOUNTER — Other Ambulatory Visit: Payer: Self-pay | Admitting: Allergy and Immunology

## 2023-08-03 ENCOUNTER — Other Ambulatory Visit: Payer: Self-pay | Admitting: Cardiovascular Disease

## 2023-08-03 ENCOUNTER — Other Ambulatory Visit (HOSPITAL_COMMUNITY): Payer: Self-pay

## 2023-08-03 MED ORDER — NITROGLYCERIN 0.4 MG SL SUBL
0.4000 mg | SUBLINGUAL_TABLET | SUBLINGUAL | 3 refills | Status: AC | PRN
  Filled 2023-08-03: qty 25, 7d supply, fill #0

## 2023-08-03 MED ORDER — FAMOTIDINE 40 MG PO TABS
40.0000 mg | ORAL_TABLET | Freq: Every morning | ORAL | 1 refills | Status: DC
  Filled 2023-08-03: qty 90, 90d supply, fill #0

## 2023-08-05 ENCOUNTER — Other Ambulatory Visit (HOSPITAL_COMMUNITY): Payer: Self-pay

## 2023-08-07 ENCOUNTER — Other Ambulatory Visit (HOSPITAL_COMMUNITY): Payer: Self-pay

## 2023-08-08 ENCOUNTER — Other Ambulatory Visit: Payer: Self-pay | Admitting: Internal Medicine

## 2023-08-08 DIAGNOSIS — R634 Abnormal weight loss: Secondary | ICD-10-CM | POA: Diagnosis not present

## 2023-08-08 DIAGNOSIS — Z23 Encounter for immunization: Secondary | ICD-10-CM | POA: Diagnosis not present

## 2023-08-08 DIAGNOSIS — Z96652 Presence of left artificial knee joint: Secondary | ICD-10-CM | POA: Diagnosis not present

## 2023-08-17 ENCOUNTER — Other Ambulatory Visit (HOSPITAL_COMMUNITY): Payer: Self-pay

## 2023-08-23 ENCOUNTER — Other Ambulatory Visit (HOSPITAL_COMMUNITY): Payer: Self-pay

## 2023-08-24 ENCOUNTER — Other Ambulatory Visit (HOSPITAL_COMMUNITY): Payer: Self-pay

## 2023-08-28 ENCOUNTER — Other Ambulatory Visit (HOSPITAL_COMMUNITY): Payer: Self-pay

## 2023-09-01 ENCOUNTER — Ambulatory Visit (HOSPITAL_COMMUNITY): Payer: Commercial Managed Care - PPO | Attending: Cardiology

## 2023-09-01 DIAGNOSIS — I25708 Atherosclerosis of coronary artery bypass graft(s), unspecified, with other forms of angina pectoris: Secondary | ICD-10-CM | POA: Diagnosis not present

## 2023-09-01 DIAGNOSIS — I359 Nonrheumatic aortic valve disorder, unspecified: Secondary | ICD-10-CM | POA: Diagnosis not present

## 2023-09-01 DIAGNOSIS — Z952 Presence of prosthetic heart valve: Secondary | ICD-10-CM | POA: Diagnosis not present

## 2023-09-01 LAB — ECHOCARDIOGRAM COMPLETE
AR max vel: 1.95 cm2
AV Area VTI: 1.9 cm2
AV Area mean vel: 1.9 cm2
AV Mean grad: 17 mm[Hg]
AV Peak grad: 29.1 mm[Hg]
Ao pk vel: 2.7 m/s
Area-P 1/2: 3.37 cm2
Calc EF: 61.1 %
S' Lateral: 2.5 cm
Single Plane A2C EF: 62.6 %
Single Plane A4C EF: 59 %

## 2023-09-04 ENCOUNTER — Other Ambulatory Visit (HOSPITAL_COMMUNITY): Payer: Commercial Managed Care - PPO

## 2023-09-05 ENCOUNTER — Telehealth: Payer: Self-pay | Admitting: Cardiovascular Disease

## 2023-09-05 ENCOUNTER — Telehealth: Payer: Self-pay

## 2023-09-05 DIAGNOSIS — Z792 Long term (current) use of antibiotics: Secondary | ICD-10-CM

## 2023-09-05 NOTE — Telephone Encounter (Signed)
   Pre-operative Risk Assessment    Patient Name: Deanna Simmons  DOB: 07/03/1951 MRN: [id number]     Request for Surgical Clearance    Procedure:  Dental Extraction - Amount of Teeth to be Pulled:  2 (#21 and #28  Date of Surgery:  Clearance TBD                                 Surgeon: Dr. Jaynie Crumble Surgeon's Group or Practice Name: [facility] Phone number: [phone] Fax number: [phone]   Type of Clearance Requested:   - Medical  - Pharmacy:  Hold Clopidogrel (Plavix)     Type of Anesthesia:  Local    Additional requests/questions:  Please advise surgeon/provider what medications should be held.  Barbette Reichmann   09/05/2023, 2:48 PM

## 2023-09-05 NOTE — Telephone Encounter (Signed)
Results reviewed with patient. She requests refill on her pre-med for dental visits. Azithromycin sent to pharmacy on 06/19/23-made pt aware, states she will cal [facility] outpatient and request refill.

## 2023-09-05 NOTE — Telephone Encounter (Signed)
-----   Message from Tonny Bollman sent at 09/05/2023  9:52 AM EST ----- Excellent echo result with normal function of the TAVR prosthesis, normal LV function. Continue current plan for follow-up.

## 2023-09-06 MED ORDER — AZITHROMYCIN 250 MG PO TABS
500.0000 mg | ORAL_TABLET | Freq: Once | ORAL | Status: AC
Start: 2023-09-06 — End: ?

## 2023-09-06 NOTE — Telephone Encounter (Signed)
   Patient Name: Deanna Simmons  DOB: 1950-11-26 MRN: [id number]  Primary Cardiologist: Tonny Bollman, MD  Chart reviewed as part of pre-operative protocol coverage.   Simple dental extractions (i.e. 1-2 teeth) are considered low risk procedures per guidelines and generally do not require any specific cardiac clearance. It is also generally accepted that for simple extractions and dental cleanings, there is no need to interrupt blood thinner therapy.    SBE prophylaxis is required for the patient from a cardiac standpoint.  Prescription for 500 mg azithromycin has been sent to patient's pharmacy on file.  I will route this recommendation to the requesting party via Epic fax function and remove from pre-op pool.  Please call with questions.  Napoleon Form, Leodis Rains, NP 09/06/2023, 9:26 AM

## 2023-09-07 ENCOUNTER — Other Ambulatory Visit (HOSPITAL_COMMUNITY): Payer: Self-pay

## 2023-09-07 DIAGNOSIS — M25662 Stiffness of left knee, not elsewhere classified: Secondary | ICD-10-CM | POA: Diagnosis not present

## 2023-09-11 ENCOUNTER — Inpatient Hospital Stay: Admission: RE | Admit: 2023-09-11 | Payer: Commercial Managed Care - PPO | Source: Ambulatory Visit

## 2023-09-13 ENCOUNTER — Ambulatory Visit (INDEPENDENT_AMBULATORY_CARE_PROVIDER_SITE_OTHER): Payer: Commercial Managed Care - PPO | Admitting: Podiatry

## 2023-09-13 ENCOUNTER — Encounter: Payer: Self-pay | Admitting: Podiatry

## 2023-09-13 ENCOUNTER — Other Ambulatory Visit (HOSPITAL_COMMUNITY): Payer: Self-pay

## 2023-09-13 DIAGNOSIS — L84 Corns and callosities: Secondary | ICD-10-CM | POA: Diagnosis not present

## 2023-09-13 DIAGNOSIS — E119 Type 2 diabetes mellitus without complications: Secondary | ICD-10-CM | POA: Diagnosis not present

## 2023-09-13 DIAGNOSIS — M21619 Bunion of unspecified foot: Secondary | ICD-10-CM

## 2023-09-13 DIAGNOSIS — M2041 Other hammer toe(s) (acquired), right foot: Secondary | ICD-10-CM | POA: Diagnosis not present

## 2023-09-13 NOTE — Progress Notes (Signed)
This patient prese nts to the office with painful second toe right foot.  Patient says her second toe is contracted and forms a painful corn on the second toe.  She was previously seen by Dr.  Marylene Land for this condition.  She has not been seen for years and she presents to the office for evaluation and treatment.  General Appearance  Alert, conversant and in no acute stress.  Vascular  Dorsalis pedis and posterior tibial  pulses are palpable  bilaterally.  Capillary return is within normal limits  bilaterally. Temperature is within normal limits  bilaterally.  Neurologic  Senn-Weinstein monofilament wire test within normal limits  bilaterally. Muscle power within normal limits bilaterally.  Nails Thick disfigured discolored nails with subungual debris  from hallux to fifth toes bilaterally. No evidence of bacterial infection or drainage bilaterally.  Orthopedic  No limitations of motion  feet .  No crepitus or effusions noted.HAV  B/L.  Rigid second toe right foot.  Skin  normotropic skin with no porokeratosis noted bilaterally.  No signs of infections or ulcers noted.     Heloma durum second toe right foot.  IE.  Debride HD.  Discussed surgical correction of second toe. Right.  Padding dispensed.  Helane Gunther DPM

## 2023-09-19 ENCOUNTER — Encounter: Payer: Self-pay | Admitting: Physician Assistant

## 2023-09-19 ENCOUNTER — Ambulatory Visit: Payer: Commercial Managed Care - PPO | Attending: Physician Assistant | Admitting: Physician Assistant

## 2023-09-19 ENCOUNTER — Other Ambulatory Visit (HOSPITAL_COMMUNITY): Payer: Self-pay

## 2023-09-19 ENCOUNTER — Ambulatory Visit: Payer: Medicare Other | Admitting: Allergy and Immunology

## 2023-09-19 VITALS — BP 130/78 | HR 80 | Resp 16 | Ht 64.0 in | Wt 194.0 lb

## 2023-09-19 DIAGNOSIS — I25708 Atherosclerosis of coronary artery bypass graft(s), unspecified, with other forms of angina pectoris: Secondary | ICD-10-CM | POA: Diagnosis not present

## 2023-09-19 DIAGNOSIS — Z792 Long term (current) use of antibiotics: Secondary | ICD-10-CM

## 2023-09-19 DIAGNOSIS — Z952 Presence of prosthetic heart valve: Secondary | ICD-10-CM

## 2023-09-19 DIAGNOSIS — I10 Essential (primary) hypertension: Secondary | ICD-10-CM

## 2023-09-19 DIAGNOSIS — I359 Nonrheumatic aortic valve disorder, unspecified: Secondary | ICD-10-CM | POA: Diagnosis not present

## 2023-09-19 DIAGNOSIS — E785 Hyperlipidemia, unspecified: Secondary | ICD-10-CM | POA: Diagnosis not present

## 2023-09-19 MED ORDER — AZITHROMYCIN 500 MG PO TABS
ORAL_TABLET | ORAL | 3 refills | Status: DC
  Filled 2023-09-19: qty 4, 30d supply, fill #0

## 2023-09-19 NOTE — Patient Instructions (Signed)
Medication Instructions:  No changes *If you need a refill on your cardiac medications before your next appointment, please call your pharmacy*  Lab Work: Today we draw a CBC If you have labs (blood work) drawn today and your tests are completely normal, you will receive your results only by: MyChart Message (if you have MyChart) OR A paper copy in the mail If you have any lab test that is abnormal or we need to change your treatment, we will call you to review the results.  Testing/Procedures: No testing  Follow-Up: At [facility], you and your health needs are our priority.  As part of our continuing mission to provide you with exceptional heart care, we have created designated Provider Care Teams.  These Care Teams include your primary Cardiologist (physician) and Advanced Practice Providers (APPs -  Physician Assistants and Nurse Practitioners) who all work together to provide you with the care you need, when you need it.  We recommend signing up for the patient portal called "MyChart".  Sign up information is provided on this After Visit Summary.  MyChart is used to connect with patients for Virtual Visits (Telemedicine).  Patients are able to view lab/test results, encounter notes, upcoming appointments, etc.  Non-urgent messages can be sent to your provider as well.   To learn more about what you can do with MyChart, go to [email or web address].    Your next appointment:   6 month(s)  Provider:   Tonny Bollman, MD

## 2023-09-19 NOTE — Progress Notes (Signed)
Cardiology Office Note:  .   Date:  09/19/2023  ID:  Thelma Barge, DOB Mar 01, 1951, MRN [id number] PCP: Thana Ates, MD  [facility] Providers Cardiologist:  Tonny Bollman, MD {  History of Present Illness: .   Deanna Simmons is a 72 y.o. female with a history of coronary artery disease and aortic stenosis presenting for follow-up evaluation.  History includes CAD status post remote CABG in 2004 with Dr. Laneta Simmers with saphenous vein graft to PDA.  Follow-up for aortic stenosis and underwent TAVR in September 2022.  Previously followed with Dr. Katrinka Blazing and Dr. Excell Seltzer assumed her cardiovascular care.  The patient was last seen in May of this year by Dr. 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  Doing well and denied any chest pain/pressure, edema, heart palpitations, orthopnea, and PND.  Does experience exertional dyspnea with walking up a hill but not on level ground.  She is most limited by left knee problems and needs to have a knee replacement.  Today, she presents with a history of heart disease and recent knee surgery for a routine follow-up. She reports ongoing recovery from the knee surgery and is currently undergoing physical therapy. She has not experienced any new heart symptoms and recent echocardiogram showed her TAVR heart valve is functioning well with no significant changes from the prior echo. The patient's heart pump function was normal. She has been taking Repatha and rosuvastatin for cholesterol management, metoprolol for heart health, and iron supplements. The patient reports a weight loss since her knee surgery and has a history of anemia. She has been taking iron supplements and her hemoglobin levels need to be rechecked.  Reports no shortness of breath nor dyspnea on exertion. Reports no chest pain, pressure, or tightness. No edema, orthopnea, PND. Reports no palpitations.    Discussed the use of AI scribe software for clinical  note transcription with the patient, who gave verbal consent to proceed.  ROS: Pertinent ROS in HPI.  Studies Reviewed: .       Echo 09/01/23 IMPRESSIONS     1. S/P TAVR with normal mean gradient 17 mmHg, DI 0.50 and no AI.   2. Left ventricular ejection fraction, by estimation, is 60 to 65%. The  left ventricle has normal function. The left ventricle has no regional  wall motion abnormalities. Left ventricular diastolic function could not  be evaluated. Elevated left atrial  pressure. The average left ventricular global longitudinal strain is -19.8  %. The global longitudinal strain is normal.   3. Right ventricular systolic function is normal. The right ventricular  size is normal. Tricuspid regurgitation signal is inadequate for assessing  PA pressure.   4. Left atrial size was mildly dilated.   5. The mitral valve is normal in structure. Mild mitral valve  regurgitation. No evidence of mitral stenosis. Severe mitral annular  calcification.   6. The aortic valve has been repaired/replaced. Aortic valve  regurgitation is not visualized. No aortic stenosis is present. There is a  23 mm Sapien prosthetic (TAVR) valve present in the aortic position.  Procedure Date: 07/13/21. Echo findings are  consistent with normal structure and function of the aortic valve  prosthesis.   7. The inferior vena cava is normal in size with greater than 50%  respiratory variability, suggesting right atrial pressure of 3 mmHg.   Comparison(s): No significant change from prior study.   FINDINGS   Left Ventricle: Left ventricular ejection  fraction, by estimation, is 60  to 65%. The left ventricle has normal function. The left ventricle has no  regional wall motion abnormalities. The average left ventricular global  longitudinal strain is -19.8 %.  The global longitudinal strain is normal. The left ventricular internal  cavity size was normal in size. There is no left ventricular hypertrophy.  Left  ventricular diastolic function could not be evaluated due to mitral  annular calcification (moderate or  greater). Left ventricular diastolic function could not be evaluated.  Elevated left atrial pressure.   Right Ventricle: The right ventricular size is normal. Right ventricular  systolic function is normal. Tricuspid regurgitation signal is inadequate  for assessing PA pressure. The tricuspid regurgitant velocity is 2.16 m/s,  and with an assumed right atrial   pressure of 3 mmHg, the estimated right ventricular systolic pressure is  21.7 mmHg.   Left Atrium: Left atrial size was mildly dilated.   Right Atrium: Right atrial size was normal in size.   Pericardium: There is no evidence of pericardial effusion.   Mitral Valve: The mitral valve is normal in structure. Severe mitral  annular calcification. Mild mitral valve regurgitation. No evidence of  mitral valve stenosis.   Tricuspid Valve: The tricuspid valve is normal in structure. Tricuspid  valve regurgitation is trivial. No evidence of tricuspid stenosis.   Aortic Valve: The aortic valve has been repaired/replaced. Aortic valve  regurgitation is not visualized. No aortic stenosis is present. Aortic  valve mean gradient measures 17.0 mmHg. Aortic valve peak gradient  measures 29.1 mmHg. Aortic valve area, by  VTI measures 1.90 cm. There is a 23 mm Sapien prosthetic, stented (TAVR)  valve present in the aortic position. Procedure Date: 07/13/21. Echo  findings are consistent with normal structure and function of the aortic  valve prosthesis.   Pulmonic Valve: The pulmonic valve was normal in structure. Pulmonic valve  regurgitation is trivial. No evidence of pulmonic stenosis.   Aorta: The aortic root is normal in size and structure.   Venous: The inferior vena cava is normal in size with greater than 50%  respiratory variability, suggesting right atrial pressure of 3 mmHg.   IAS/Shunts: No atrial level shunt detected  by color flow Doppler.   Additional Comments: S/P TAVR with normal mean gradient 17 mmHg, DI 0.50  and no AI.   Physical Exam:   VS:  BP 130/78 (BP Location: Left Arm, Patient Position: Sitting, Cuff Size: Normal)   Pulse 80   Resp 16   Ht 5\' 4"  (1.626 m)   Wt 194 lb (88 kg)   SpO2 98%   BMI 33.30 kg/m    Wt Readings from Last 3 Encounters:  09/19/23 194 lb (88 kg)  07/10/23 202 lb (91.6 kg)  06/27/23 202 lb (91.6 kg)    GEN: Well nourished, well developed in no acute distress NECK: No JVD; No carotid bruits CARDIAC: RRR, no murmurs, rubs, gallops RESPIRATORY:  Clear to auscultation without rales, wheezing or rhonchi  ABDOMEN: Soft, non-tender, non-distended EXTREMITIES:  No edema; No deformity   ASSESSMENT AND PLAN: .    Coronary artery disease -no chest pains or SOB -continue current medication regimen  Post-operative Knee Replacement Patient is currently in physical therapy and reports ongoing recovery. -Continue physical therapy as directed.  Transcatheter Aortic Valve Replacement (TAVR) Two years post-procedure. Recent echocardiogram shows TAVR valve is functioning well without significant leak or narrowing. No significant change from prior echo. Heart pump function normal. -Continue current management.  Anemia Hemoglobin was low (9.7) in September. Patient has been taking iron supplements. -Order CBC today to check hemoglobin levels.  Hyperlipidemia Patient is on rosuvastatin 40mg  and Repatha. Last LDL was 45. -Continue current medications. -Plan to recheck lipid panel in 6 months.  Hypertension Blood pressure was 130/78 today. Patient is on metoprolol 25mg  daily. -Continue current medication.  Follow-up Schedule a 31-month follow-up appointment with Dr. Excell Seltzer.   Signed, Sharlene Dory, PA-C

## 2023-09-20 ENCOUNTER — Ambulatory Visit: Payer: Medicare Other | Admitting: Podiatry

## 2023-09-20 LAB — CBC
Hematocrit: 38.5 % (ref 34.0–46.6)
Hemoglobin: 11.9 g/dL (ref 11.1–15.9)
MCH: 26.9 pg (ref 26.6–33.0)
MCHC: 30.9 g/dL — ABNORMAL LOW (ref 31.5–35.7)
MCV: 87 fL (ref 79–97)
Platelets: 250 10*3/uL (ref 150–450)
RBC: 4.43 x10E6/uL (ref 3.77–5.28)
RDW: 12.6 % (ref 11.7–15.4)
WBC: 6.8 10*3/uL (ref 3.4–10.8)

## 2023-09-21 ENCOUNTER — Other Ambulatory Visit (HOSPITAL_COMMUNITY): Payer: Self-pay

## 2023-09-26 ENCOUNTER — Ambulatory Visit: Payer: Commercial Managed Care - PPO | Admitting: Allergy and Immunology

## 2023-09-26 ENCOUNTER — Other Ambulatory Visit: Payer: Self-pay

## 2023-09-26 ENCOUNTER — Other Ambulatory Visit (HOSPITAL_BASED_OUTPATIENT_CLINIC_OR_DEPARTMENT_OTHER): Payer: Self-pay

## 2023-09-26 ENCOUNTER — Other Ambulatory Visit (HOSPITAL_COMMUNITY): Payer: Self-pay

## 2023-09-26 ENCOUNTER — Encounter: Payer: Self-pay | Admitting: Allergy and Immunology

## 2023-09-26 VITALS — BP 100/62 | HR 83 | Temp 98.0°F | Ht 63.0 in | Wt 192.1 lb

## 2023-09-26 DIAGNOSIS — J301 Allergic rhinitis due to pollen: Secondary | ICD-10-CM | POA: Diagnosis not present

## 2023-09-26 DIAGNOSIS — K219 Gastro-esophageal reflux disease without esophagitis: Secondary | ICD-10-CM

## 2023-09-26 DIAGNOSIS — R040 Epistaxis: Secondary | ICD-10-CM

## 2023-09-26 DIAGNOSIS — J3089 Other allergic rhinitis: Secondary | ICD-10-CM

## 2023-09-26 DIAGNOSIS — J4540 Moderate persistent asthma, uncomplicated: Secondary | ICD-10-CM

## 2023-09-26 MED ORDER — CETIRIZINE HCL 10 MG PO TABS
10.0000 mg | ORAL_TABLET | Freq: Every day | ORAL | 5 refills | Status: DC | PRN
Start: 2023-09-26 — End: 2024-03-19
  Filled 2023-09-26: qty 60, 60d supply, fill #0
  Filled 2023-10-17: qty 60, 60d supply, fill #1

## 2023-09-26 MED ORDER — ALBUTEROL SULFATE HFA 108 (90 BASE) MCG/ACT IN AERS
2.0000 | INHALATION_SPRAY | RESPIRATORY_TRACT | 1 refills | Status: DC | PRN
  Filled 2023-09-26: qty 6.7, 17d supply, fill #0

## 2023-09-26 MED ORDER — SPIRIVA RESPIMAT 1.25 MCG/ACT IN AERS
2.0000 | INHALATION_SPRAY | Freq: Every day | RESPIRATORY_TRACT | 5 refills | Status: DC | PRN
  Filled 2023-09-26: qty 4, 30d supply, fill #0

## 2023-09-26 MED ORDER — FLUTICASONE PROPIONATE 50 MCG/ACT NA SUSP
NASAL | 1 refills | Status: DC
  Filled 2023-09-26: qty 48, 90d supply, fill #0
  Filled 2023-10-17: qty 32, 90d supply, fill #0

## 2023-09-26 MED ORDER — FLUTICASONE-SALMETEROL 250-50 MCG/ACT IN AEPB
1.0000 | INHALATION_SPRAY | Freq: Two times a day (BID) | RESPIRATORY_TRACT | 5 refills | Status: DC
  Filled 2023-09-26 – 2024-03-02 (×2): qty 60, 30d supply, fill #0

## 2023-09-26 MED ORDER — FAMOTIDINE 40 MG PO TABS
40.0000 mg | ORAL_TABLET | Freq: Every morning | ORAL | 1 refills | Status: DC
  Filled 2023-09-26 – 2023-11-09 (×3): qty 90, 90d supply, fill #0
  Filled 2024-02-05: qty 90, 90d supply, fill #1

## 2023-09-26 MED ORDER — ARNUITY ELLIPTA 200 MCG/ACT IN AEPB
1.0000 | INHALATION_SPRAY | Freq: Two times a day (BID) | RESPIRATORY_TRACT | 5 refills | Status: DC
  Filled 2023-09-26: qty 60, 60d supply, fill #0

## 2023-09-26 NOTE — Progress Notes (Unsigned)
[place] [facility] [place] - [place] - [place] - [place]   Follow-up Note  Referring Provider: Marden Noble, MD Primary Provider: Thana Ates, MD Date of Office Visit: 09/26/2023  Subjective:   Deanna Simmons (DOB: 1951/05/04) is a 72 y.o. female who [facility] in re-evaluation of the following:  HPI: Deanna Simmons returns to this clinic in evaluation of asthma, allergic rhinitis, LPR.  I last saw her in this clinic 27 December 2022.  She has really done well with her airway issue.  There was one episode in May in which she apparently developed a viral respiratory tract infection which gave rise to a slight flare of her asthma for which she activated her "action plan" for several days and everything resolved.  Otherwise, she has not required a systemic steroid or an antibiotic for any type of airway issue while she consistently uses her Advair 1 time per day.  She has had very little problems with her nose while using Flonase 1 time per day.  She did have an episode of epistaxis affecting her right nostril this morning which was a relatively new and infrequent development.  She has had a little bit of phlegm and throat clearing in her throat past few days but no other significant respiratory tract symptoms.  Her reflux is under excellent control while using famotidine.  She has received this years flu vaccine.  She is undergoing rehabilitation for her total left knee replacement completed September 2024.  Allergies as of 09/26/2023       Reactions   Cardiolite [technetium-16m] Itching   Ciprofloxacin Itching   Clarithromycin Itching   Erythromycin Base Rash   Iodinated Contrast Media Itching   Pt has a potential contrast allergy; She states in 2012 her cardiologist told her she had a reaction during the "study". We are unable to determine the study or confirm a reaction. The patient will have a 13 hr prep for future studies. Molecular  Imaging states their injection does not cause any reactions.   Niacin Itching   Niaspan [niacin Er (antihyperlipidemic)] Other (See Comments)   flushing   Other Itching   Chemical Use And Stress Cardiolite Testing (Uncoded)   Penicillins Hives, Itching, Rash   Reaction: over 10 years Tolerated Cephalosporin Date: 07/11/23.   Prilosec [omeprazole] Itching   Singulair [montelukast] Hives, Itching   Ticlid [ticlopidine Hcl] Itching        Medication List    acetaminophen 325 MG tablet Commonly known as: TYLENOL Take 650 mg by mouth every 6 (six) hours as needed for moderate pain.   albuterol 108 (90 Base) MCG/ACT inhaler Commonly known as: Ventolin HFA Inhale 2 puffs into the lungs every 4 to 6 hours as needed for wheezing, coughing or shortness of breath.   Arnuity Ellipta 200 MCG/ACT Aepb Generic drug: Fluticasone Furoate Inhale 1 puff into the lungs daily. Use during asthma flares as directed.   ascorbic acid 500 MG tablet Commonly known as: VITAMIN C Take 500 mg by mouth daily.   azithromycin 500 MG tablet Commonly known as: Zithromax Take one tablet by mouth1 hour before any dental work including cleanings.   azithromycin 500 MG tablet Commonly known as: ZITHROMAX Take 1 tablet by mouth 1 hour prior to dental treatment.   azithromycin 500 MG tablet Commonly known as: ZITHROMAX Take 1 tablet by mouth one hour prior to dental appointment.   beclomethasone 80 MCG/ACT inhaler Commonly known as: QVAR Inhale 1 puff  into the lungs daily as needed (asthma).   cetirizine 10 MG tablet Commonly known as: ZYRTEC Take 1 tablet (10 mg total) by mouth daily as needed for allergies (Can use an extra dose during flares). What changed: when to take this   chlorhexidine 4 % external liquid Commonly known as: HIBICLENS Apply 15 mLs (1 Application total) topically as directed for 30 doses. Use as directed daily for 5 days every other week for 6 weeks.   clopidogrel 75 MG  tablet Commonly known as: PLAVIX Take 1 tablet (75 mg total) by mouth daily.   Estradiol 10 MCG Tabs vaginal tablet Place 1 tablet (10 mcg total) vaginally 2 (two) times a week as directed. What changed: when to take this   famotidine 40 MG tablet Commonly known as: PEPCID Take 1 tablet (40 mg total) by mouth in the morning.   Farxiga 10 MG Tabs tablet Generic drug: dapagliflozin propanediol Take 1 tablet by mouth daily   ferrous sulfate 325 (65 FE) MG tablet Take 325 mg by mouth daily.   fluticasone 50 MCG/ACT nasal spray Commonly known as: FLONASE Place 1 spray into both nostrils daily.   fluticasone-salmeterol 250-50 MCG/ACT Aepb Commonly known as: ADVAIR Inhale 1 puff into the lungs in the morning and at bedtime. What changed: when to take this   glucose blood test strip Use to test blood glucose twice daily before meals.   ketoconazole 2 % cream Commonly known as: NIZORAL Apply to affected area twice a day for skin fungus infection for 14 days   methocarbamol 500 MG tablet Commonly known as: ROBAXIN Take 1 tablet (500 mg total) by mouth every 6 (six) hours as needed for muscle spasms.   metoprolol succinate 25 MG 24 hr tablet Commonly known as: TOPROL-XL Take 1 tablet (25 mg total) by mouth daily.   MULTIVITAL PO Take 1 tablet by mouth daily.   nitroGLYCERIN 0.4 MG SL tablet Commonly known as: NITROSTAT Place 0.4 mg under the tongue every 5 (five) minutes as needed for chest pain.   nitroGLYCERIN 0.4 MG SL tablet Commonly known as: NITROSTAT Place 1 tablet (0.4 mg total) under the tongue every 5 (five) minutes as needed for chest pain.   nystatin powder Commonly known as: MYCOSTATIN/NYSTOP Apply to affected area 2 times a day as needed for 7 days   ondansetron 4 MG tablet Commonly known as: ZOFRAN Take 1 tablet (4 mg total) by mouth every 6 (six) hours as needed for nausea.   oxyCODONE 5 MG immediate release tablet Commonly known as: Oxy  IR/ROXICODONE Take 1-2 tablets (5-10 mg total) by mouth every 6 (six) hours as needed for severe pain.   PROBIOTIC DAILY PO Take 1 capsule by mouth at bedtime. Ultra Flora IB   Repatha SureClick 140 MG/ML Soaj Generic drug: Evolocumab Inject 140 mg into the skin every 14 (fourteen) days.   rosuvastatin 40 MG tablet Commonly known as: CRESTOR Take 1 tablet (40 mg total) by mouth daily.   Spiriva Respimat 1.25 MCG/ACT Aers Generic drug: Tiotropium Bromide Monohydrate Take 2 Inhalations by mouth daily as needed.   tiZANidine 4 MG tablet Commonly known as: ZANAFLEX Take 1 tablet (4 mg total) by mouth 3 (three) times daily as needed for muscle spasm   traMADol 50 MG tablet Commonly known as: ULTRAM Take 1-2 tablets (50-100 mg total) by mouth every 6 (six) hours as needed for moderate pain.   triamcinolone ointment 0.1 % Commonly known as: KENALOG Apply once a daily in  the evening for 2-4 weeks   valACYclovir 500 MG tablet Commonly known as: VALTREX TAKE 1 TABLET BY MOUTH TWICE DAILY AS NEEDED   VITAMIN K2-VITAMIN D3 PO Take 1 tablet by mouth daily.   zinc gluconate 50 MG tablet Take 50 mg by mouth every evening.    Past Medical History:  Diagnosis Date   Abnormal finding on Pap smear, ASCUS    Anemia    Asthma    Chondromalacia of knee    Coronary artery disease    Diabetes mellitus without complication (HCC)    Diverticulum 12/11/2002   in cecum   Fibroids    h/o   GERD (gastroesophageal reflux disease)    H/O tinea cruris 02/28/2010   Heart murmur    History of CVA (cerebrovascular accident)    Hypercholesteremia    Hypertension    Lupus    Obesity    Pneumonia 07/01/2008   S/P TAVR (transcatheter aortic valve replacement) 07/13/2021   s/p TAVR w/ a 23mm Edwards S3U via the TF approach by Dr. Excell Seltzer and Dr. Laneta Simmers.   Synovitis of knee    Systemic lupus erythematosus (HCC)    with nephritis   Vulvitis    h/o    Past Surgical History:  Procedure  Laterality Date   CARDIAC CATHETERIZATION     CATARACT EXTRACTION, BILATERAL     CESAREAN SECTION     CORONARY ARTERY BYPASS GRAFT     Endometrial curettage  03/29/2000   INTRAOPERATIVE TRANSTHORACIC ECHOCARDIOGRAM Left 07/13/2021   Procedure: INTRAOPERATIVE TRANSTHORACIC ECHOCARDIOGRAM;  Surgeon: Tonny Bollman, MD;  Location: [facility] OR;  Service: Open Heart Surgery;  Laterality: Left;   RIGHT/LEFT HEART CATH AND CORONARY/GRAFT ANGIOGRAPHY N/A 06/11/2021   Procedure: RIGHT/LEFT HEART CATH AND CORONARY/GRAFT ANGIOGRAPHY;  Surgeon: Lyn Records, MD;  Location: [facility] INVASIVE CV LAB;  Service: Cardiovascular;  Laterality: N/A;   ROTATOR CUFF REPAIR Right    TOTAL KNEE ARTHROPLASTY Left 07/10/2023   Procedure: TOTAL KNEE ARTHROPLASTY;  Surgeon: Ollen Gross, MD;  Location: [facility] ORS;  Service: Orthopedics;  Laterality: Left;   TRANSCATHETER AORTIC VALVE REPLACEMENT, TRANSFEMORAL N/A 07/13/2021   Procedure: TRANSCATHETER AORTIC VALVE REPLACEMENT, TRANSFEMORAL;  Surgeon: Tonny Bollman, MD;  Location: [facility] OR;  Service: Open Heart Surgery;  Laterality: N/A;   TUBAL LIGATION     ULTRASOUND GUIDANCE FOR VASCULAR ACCESS Bilateral 07/13/2021   Procedure: ULTRASOUND GUIDANCE FOR VASCULAR ACCESS;  Surgeon: Tonny Bollman, MD;  Location: [facility] OR;  Service: Open Heart Surgery;  Laterality: Bilateral;   UPPER GASTROINTESTINAL ENDOSCOPY  12/11/2002    Review of systems negative except as noted in HPI / PMHx or noted below:  Review of Systems  Constitutional: Negative.   HENT: Negative.    Eyes: Negative.   Respiratory: Negative.    Cardiovascular: Negative.   Gastrointestinal: Negative.   Genitourinary: Negative.   Musculoskeletal: Negative.   Skin: Negative.   Neurological: Negative.   Endo/Heme/Allergies: Negative.   Psychiatric/Behavioral: Negative.       Objective:   Vitals:   09/26/23 1044  Pulse: 83  Temp: 98 F (36.7 C)  SpO2: 98%   Height: 5\' 3"  (160 cm)  Weight: 192 lb 1.6 oz (87.1  kg)   Physical Exam Constitutional:      Appearance: She is not diaphoretic.  HENT:     Head: Normocephalic.     Right Ear: Tympanic membrane, ear canal and external ear normal.     Left Ear: Tympanic membrane, ear canal and external ear normal.  Nose: Nose normal. No mucosal edema or rhinorrhea.     Mouth/Throat:     Pharynx: Uvula midline. No oropharyngeal exudate.  Eyes:     Conjunctiva/sclera: Conjunctivae normal.  Neck:     Thyroid: No thyromegaly.     Trachea: Trachea normal. No tracheal tenderness or tracheal deviation.  Cardiovascular:     Rate and Rhythm: Normal rate and regular rhythm.     Heart sounds: Normal heart sounds, S1 normal and S2 normal. No murmur heard. Pulmonary:     Effort: No respiratory distress.     Breath sounds: Normal breath sounds. No stridor. No wheezing or rales.  Lymphadenopathy:     Head:     Right side of head: No tonsillar adenopathy.     Left side of head: No tonsillar adenopathy.     Cervical: No cervical adenopathy.  Skin:    Findings: No erythema or rash.     Nails: There is no clubbing.  Neurological:     Mental Status: She is alert.     Diagnostics: Spirometry was performed and demonstrated an FEV1 of 2.09 at 114 % of predicted.  Assessment and Plan:   1. Asthma, moderate persistent, well-controlled   2. Perennial allergic rhinitis   3. Seasonal allergic rhinitis due to pollen   4. LPRD (laryngopharyngeal reflux disease)   5. Epistaxis    1.  Continue Advair 250 1 inhalation 1-2 times per day depending on disease activity  2.  Continue Flonase 1 spray each nostril 3-7 times per week depending on disease activity  3.  Continue Famotidine 40 mg tablet 1 time per day  4.  "Action plan" for asthma flare:   A. Add Arnuity 200 - 1 inhalation 2 times per day  B. Use Albuterol HFA -2 puffs every 4-6 hours if needed  C. Increase famotidine to 40 mg 2 times per day  D. Spiriva 1.25 Respimat - 2 inhalations 1 time per day    5. If needed:  A. Zyrtec 10 mg one tablet one time per day  B. Nasal saline  6. No more nasal steroid for the remainder of this week.  7. Return to clinic in 6 months or earlier if problem  Deanna Simmons appears to be doing very well with minimal amounts of medications directed against respiratory tract inflammation and her reflux is under good control while using famotidine.  She has a "action plan" to be utilized should she develop an asthma flare in the future.  She has epistaxis and she is on Plavix so we need to be somewhat careful about this issue I am going to have her discontinue her nasal steroid for the remainder of this week.  I do not see an obvious area of active bleeding within her nasal airway during today's visit.  If she does well with the plan noted above I will see her back in this clinic in 6 months or earlier if there is a problem.         Laurette Schimke, MD Allergy / Immunology [facility]

## 2023-09-26 NOTE — Patient Instructions (Addendum)
  1.  Continue Advair 250 1 inhalation 1-2 times per day depending on disease activity  2.  Continue Flonase 1 spray each nostril 3-7 times per week depending on disease activity  3.  Continue Famotidine 40 mg tablet 1 time per day  4.  "Action plan" for asthma flare:   A. Add Arnuity 200 - 1 inhalation 2 times per day  B. Use Albuterol HFA -2 puffs every 4-6 hours if needed  C. Increase famotidine to 40 mg 2 times per day  D. Spiriva 1.25 Respimat - 2 inhalations 1 time per day   5. If needed:  A. Zyrtec 10 mg one tablet one time per day  B. Nasal saline  6. No more nasal steroid for the remainder of this week.  7. Return to clinic in 6 months or earlier if problem

## 2023-09-27 ENCOUNTER — Encounter: Payer: Self-pay | Admitting: Allergy and Immunology

## 2023-10-03 ENCOUNTER — Other Ambulatory Visit (HOSPITAL_COMMUNITY): Payer: Self-pay

## 2023-10-04 ENCOUNTER — Other Ambulatory Visit (HOSPITAL_COMMUNITY): Payer: Self-pay

## 2023-10-12 DIAGNOSIS — Z124 Encounter for screening for malignant neoplasm of cervix: Secondary | ICD-10-CM | POA: Diagnosis not present

## 2023-10-12 DIAGNOSIS — E1169 Type 2 diabetes mellitus with other specified complication: Secondary | ICD-10-CM | POA: Diagnosis not present

## 2023-10-12 DIAGNOSIS — Z1231 Encounter for screening mammogram for malignant neoplasm of breast: Secondary | ICD-10-CM | POA: Diagnosis not present

## 2023-10-12 DIAGNOSIS — Z01411 Encounter for gynecological examination (general) (routine) with abnormal findings: Secondary | ICD-10-CM | POA: Diagnosis not present

## 2023-10-17 ENCOUNTER — Other Ambulatory Visit (HOSPITAL_COMMUNITY): Payer: Self-pay

## 2023-10-17 ENCOUNTER — Other Ambulatory Visit: Payer: Self-pay

## 2023-10-17 ENCOUNTER — Other Ambulatory Visit: Payer: Self-pay | Admitting: Physician Assistant

## 2023-10-17 MED ORDER — ESTRADIOL 10 MCG VA TABS
ORAL_TABLET | VAGINAL | 11 refills | Status: AC
  Filled 2023-10-17: qty 8, 28d supply, fill #0
  Filled 2024-02-06 – 2024-03-14 (×2): qty 8, 28d supply, fill #1

## 2023-10-17 MED ORDER — CLOPIDOGREL BISULFATE 75 MG PO TABS
75.0000 mg | ORAL_TABLET | Freq: Every day | ORAL | 1 refills | Status: DC
  Filled 2023-10-17 – 2023-12-09 (×2): qty 90, 90d supply, fill #0
  Filled 2024-03-02: qty 90, 90d supply, fill #1

## 2023-10-17 NOTE — Progress Notes (Signed)
Spoke with the pt about ordering her a 13 hr prep the pt. Wanted to reschedule due to working that day and another appointment. She said she would call back to reschedule with the schedulers when she was in front of her counter.

## 2023-10-19 ENCOUNTER — Other Ambulatory Visit (HOSPITAL_COMMUNITY): Payer: Self-pay

## 2023-10-19 ENCOUNTER — Inpatient Hospital Stay: Admission: RE | Admit: 2023-10-19 | Payer: Commercial Managed Care - PPO | Source: Ambulatory Visit

## 2023-10-19 ENCOUNTER — Inpatient Hospital Stay
Admission: RE | Admit: 2023-10-19 | Discharge: 2023-10-19 | Disposition: A | Discharge status: Home / Self Care | Payer: Commercial Managed Care - PPO | Source: Ambulatory Visit | Attending: Internal Medicine | Admitting: Internal Medicine

## 2023-10-27 ENCOUNTER — Other Ambulatory Visit: Payer: Commercial Managed Care - PPO

## 2023-11-10 ENCOUNTER — Other Ambulatory Visit: Payer: Self-pay

## 2023-11-13 ENCOUNTER — Other Ambulatory Visit (HOSPITAL_COMMUNITY): Payer: Self-pay

## 2023-11-13 MED ORDER — TRIAMCINOLONE ACETONIDE 0.025 % EX CREA
TOPICAL_CREAM | CUTANEOUS | 0 refills | Status: DC
  Filled 2023-11-13: qty 15, 10d supply, fill #0

## 2023-11-18 ENCOUNTER — Other Ambulatory Visit (HOSPITAL_COMMUNITY): Payer: Self-pay

## 2023-11-23 ENCOUNTER — Other Ambulatory Visit (HOSPITAL_COMMUNITY): Payer: Self-pay

## 2023-11-27 ENCOUNTER — Telehealth: Payer: Self-pay

## 2023-11-27 NOTE — Telephone Encounter (Signed)
Patient called in - DOB verified - stated the following;  She started having the following symptoms this weekend: stuffy, runny nose - when she blew her nose this morning, she noticed some blood - didn't know if it was from the nasal sprays or not.  Patient denies: fever, body aches, cough, chest discomfort, shortness of breath, wheeze  Patient advised/reminded to use nasal saline, nellie pot, nasal saline rinse before using nasal spray to help rinse out extra mucous from the sinus cavities and keep the nasal passages from dryness, stuffiness, post nasal drip, nasal congestion.  Patient advised if nose bleeding continues after trying the above - send provider a myChart message or contact the office.  Patient verbalized understanding to all, no further questions.

## 2023-12-07 ENCOUNTER — Encounter: Payer: Self-pay | Admitting: Podiatry

## 2023-12-07 ENCOUNTER — Ambulatory Visit: Payer: PPO | Admitting: Podiatry

## 2023-12-07 ENCOUNTER — Ambulatory Visit: Payer: Medicare Other | Admitting: Podiatry

## 2023-12-07 DIAGNOSIS — M2041 Other hammer toe(s) (acquired), right foot: Secondary | ICD-10-CM | POA: Diagnosis not present

## 2023-12-09 ENCOUNTER — Other Ambulatory Visit (HOSPITAL_COMMUNITY): Payer: Self-pay

## 2023-12-11 ENCOUNTER — Other Ambulatory Visit (HOSPITAL_COMMUNITY): Payer: Self-pay

## 2023-12-11 MED ORDER — TRIAMCINOLONE ACETONIDE 0.025 % EX CREA
TOPICAL_CREAM | CUTANEOUS | 0 refills | Status: AC
  Filled 2023-12-11: qty 15, 10d supply, fill #0

## 2023-12-13 NOTE — Progress Notes (Signed)
 Subjective:  Patient ID: Deanna Simmons, female    DOB: [birth year],  MRN: [id number]  Chief Complaint  Patient presents with   Toe Pain    I have a Hammer Toe.  I had it shaved but it came back with a vengeance.    73 y.o. female presents with the above complaint.  Patient presents with right second digit hammertoe contracture with underlying heloma durum.  Patient states been present for quite some time it progressively gets worse.  She had a shave but it has come back with a vengeance.  She wanted discuss treatment options for it including surgical plans.  Pain scale 7 out of 10 dull achy in nature she would like to discuss treatment options including surgical   Review of Systems: Negative except as noted in the HPI. Denies N/V/F/Ch.  Past Medical History:  Diagnosis Date   Abnormal finding on Pap smear, ASCUS    Anemia    Asthma    Chondromalacia of knee    Coronary artery disease    Diabetes mellitus without complication (HCC)    Diverticulum 12/11/2002   in cecum   Fibroids    h/o   GERD (gastroesophageal reflux disease)    H/O tinea cruris 02/28/2010   Heart murmur    History of CVA (cerebrovascular accident)    Hypercholesteremia    Hypertension    Lupus    Obesity    Pneumonia 07/01/2008   S/P TAVR (transcatheter aortic valve replacement) 07/13/2021   s/p TAVR w/ a 23mm Edwards S3U via the TF approach by Dr. Wonda and Dr. Lucas.   Synovitis of knee    Systemic lupus erythematosus (HCC)    with nephritis   Vulvitis    h/o    Current Outpatient Medications:    acetaminophen  (TYLENOL ) 325 MG tablet, Take 650 mg by mouth every 6 (six) hours as needed for moderate pain., Disp: , Rfl:    albuterol  (VENTOLIN  HFA) 108 (90 Base) MCG/ACT inhaler, Inhale 2 puffs into the lungs every 4 to 6 hours as needed for wheezing, coughing or shortness of breath., Disp: 6.7 g, Rfl: 1   azithromycin  (ZITHROMAX ) 500 MG tablet, Take one tablet by mouth1 hour before any dental work  including cleanings., Disp: 6 tablet, Rfl: 12   azithromycin  (ZITHROMAX ) 500 MG tablet, Take 1 tablet by mouth 1 hour prior to dental treatment., Disp: 1 tablet, Rfl: 3   azithromycin  (ZITHROMAX ) 500 MG tablet, Take 1 tablet by mouth one hour prior to dental appointment., Disp: 4 tablet, Rfl: 3   beclomethasone (QVAR ) 80 MCG/ACT inhaler, Inhale 1 puff into the lungs daily as needed (asthma)., Disp: , Rfl:    cetirizine  (ZYRTEC ) 10 MG tablet, Take 1 tablet (10 mg total) by mouth daily as needed for allergies (Can use an extra dose during flares)., Disp: 60 tablet, Rfl: 5   chlorhexidine  (HIBICLENS ) 4 % external liquid, Apply 15 mLs (1 Application total) topically as directed for 30 doses. Use as directed daily for 5 days every other week for 6 weeks., Disp: 946 mL, Rfl: 1   clopidogrel  (PLAVIX ) 75 MG tablet, Take 1 tablet (75 mg total) by mouth daily., Disp: 90 tablet, Rfl: 1   dapagliflozin  propanediol (FARXIGA ) 10 MG TABS tablet, Take 1 tablet by mouth daily, Disp: 90 tablet, Rfl: 3   Estradiol  10 MCG TABS vaginal tablet, Place 1 tablet (10 mcg total) vaginally 2 (two) times a week as directed., Disp: 8 tablet, Rfl: 11   Evolocumab  (REPATHA   SURECLICK) 140 MG/ML SOAJ, Inject 140 mg into the skin every 14 (fourteen) days., Disp: 6 mL, Rfl: 3   famotidine  (PEPCID ) 40 MG tablet, Take 1 tablet (40 mg total) by mouth in the morning., Disp: 90 tablet, Rfl: 1   ferrous sulfate  325 (65 FE) MG tablet, Take 325 mg by mouth daily., Disp: , Rfl:    fluticasone  (FLONASE ) 50 MCG/ACT nasal spray, Use 1 spray in each nostril 3-7 times per week depending on disease activity., Disp: 48 g, Rfl: 1   Fluticasone  Furoate (ARNUITY ELLIPTA ) 200 MCG/ACT AEPB, Inhale 1 puff into the lungs 2 (two) times daily during asthma flare., Disp: 60 each, Rfl: 5   fluticasone -salmeterol (ADVAIR ) 250-50 MCG/ACT AEPB, Inhale 1 puff into the lungs in the morning and at bedtime., Disp: 60 each, Rfl: 5   glucose blood test strip, Use to test  blood glucose twice daily before meals., Disp: 200 strip, Rfl: 3   ketoconazole  (NIZORAL ) 2 % cream, Apply to affected area twice a day for skin fungus infection for 14 days (Patient taking differently: Apply 1 Application topically daily as needed for irritation.), Disp: 30 g, Rfl: 1   methocarbamol  (ROBAXIN ) 500 MG tablet, Take 1 tablet (500 mg total) by mouth every 6 (six) hours as needed for muscle spasms., Disp: 40 tablet, Rfl: 0   metoprolol  succinate (TOPROL -XL) 25 MG 24 hr tablet, Take 1 tablet (25 mg total) by mouth daily., Disp: 90 tablet, Rfl: 3   Multiple Vitamins-Minerals (MULTIVITAL PO), Take 1 tablet by mouth daily., Disp: , Rfl:    nitroGLYCERIN  (NITROSTAT ) 0.4 MG SL tablet, Place 0.4 mg under the tongue every 5 (five) minutes as needed for chest pain., Disp: , Rfl:    nystatin  (MYCOSTATIN /NYSTOP ) powder, Apply to affected area 2 times a day as needed for 7 days (Patient taking differently: 1 Application 2 (two) times daily as needed (irritation).), Disp: 30 g, Rfl: 1   Probiotic Product (PROBIOTIC DAILY PO), Take 1 capsule by mouth at bedtime. Ultra Flora IB, Disp: , Rfl:    rosuvastatin  (CRESTOR ) 40 MG tablet, Take 1 tablet (40 mg total) by mouth daily., Disp: 90 tablet, Rfl: 3   Tiotropium Bromide  Monohydrate (SPIRIVA  RESPIMAT) 1.25 MCG/ACT AERS, Take 2 Inhalations by mouth daily as needed., Disp: 4 g, Rfl: 5   triamcinolone  ointment (KENALOG ) 0.1 %, Apply once a daily in the evening for 2-4 weeks (Patient taking differently: Apply 1 Application topically daily as needed (irritation).), Disp: 60 g, Rfl: 0   valACYclovir  (VALTREX ) 500 MG tablet, TAKE 1 TABLET BY MOUTH TWICE DAILY AS NEEDED, Disp: 60 tablet, Rfl: 3   vitamin C (ASCORBIC ACID) 500 MG tablet, Take 500 mg by mouth daily., Disp: , Rfl:    Vitamin D-Vitamin K (VITAMIN K2-VITAMIN D3 PO), Take 1 tablet by mouth daily., Disp: , Rfl:    zinc  gluconate 50 MG tablet, Take 50 mg by mouth every evening., Disp: , Rfl:     nitroGLYCERIN  (NITROSTAT ) 0.4 MG SL tablet, Place 1 tablet (0.4 mg total) under the tongue every 5 (five) minutes as needed for chest pain., Disp: 25 tablet, Rfl: 3   ondansetron  (ZOFRAN ) 4 MG tablet, Take 1 tablet (4 mg total) by mouth every 6 (six) hours as needed for nausea. (Patient not taking: Reported on 12/07/2023), Disp: 20 tablet, Rfl: 0   oxyCODONE  (OXY IR/ROXICODONE ) 5 MG immediate release tablet, Take 1-2 tablets (5-10 mg total) by mouth every 6 (six) hours as needed for severe pain. (Patient not taking:  Reported on 12/07/2023), Disp: 42 tablet, Rfl: 0   tiZANidine  (ZANAFLEX ) 4 MG tablet, Take 1 tablet (4 mg total) by mouth 3 (three) times daily as needed for muscle spasm (Patient not taking: Reported on 09/26/2023), Disp: 30 tablet, Rfl: 0   traMADol  (ULTRAM ) 50 MG tablet, Take 1-2 tablets (50-100 mg total) by mouth every 6 (six) hours as needed for moderate pain. (Patient not taking: Reported on 12/07/2023), Disp: 40 tablet, Rfl: 0   triamcinolone  (KENALOG ) 0.025 % cream, Apply to affected area once a day as directed for 10 days., Disp: 15 g, Rfl: 0  Current Facility-Administered Medications:    azithromycin  (ZITHROMAX ) tablet 500 mg, 500 mg, Oral, Once,   Social History   Tobacco Use  Smoking Status Former  Smokeless Tobacco Never    Allergies  Allergen Reactions   Cardiolite  [Technetium-58m] Itching   Ciprofloxacin  Itching   Clarithromycin Itching   Erythromycin Base Rash   Iodinated Contrast Media Itching    Pt has a potential contrast allergy; She states in 2012 her cardiologist told her she had a reaction during the study. We are unable to determine the study or confirm a reaction. The patient will have a 13 hr prep for future studies. Molecular Imaging states their injection does not cause any reactions.   Niacin Itching   Niaspan [Niacin Er (Antihyperlipidemic)] Other (See Comments)    flushing   Other Itching    Chemical Use And Stress Cardiolite  Testing (Uncoded)    Penicillins Hives, Itching and Rash    Reaction: over 10 years Tolerated Cephalosporin Date: 07/11/23.     Prilosec [Omeprazole] Itching   Singulair  [Montelukast ] Hives and Itching   Ticlid [Ticlopidine Hcl] Itching   Objective:  There were no vitals filed for this visit. There is no height or weight on file to calculate BMI. Constitutional Well developed. Well nourished.  Vascular Dorsalis pedis pulses palpable bilaterally. Posterior tibial pulses palpable bilaterally. Capillary refill normal to all digits.  No cyanosis or clubbing noted. Pedal hair growth normal.  Neurologic Normal speech. Oriented to person, place, and time. Epicritic sensation to light touch grossly present bilaterally.  Dermatologic Nails well groomed and normal in appearance. No open wounds. No skin lesions.  Orthopedic: Right second digit hammertoe contracture semiflexible in nature.  Some joint contracture noted at the second metatarsophalangeal joint.  No other abnormalities identified   Radiographs: None Assessment:  No diagnosis found. Plan:  Patient was evaluated and treated and all questions answered.  Right second digit hammertoe contracture -All questions and concerns were discussed with the patient in extensive detail given the amount of pain that I discussed with her all conservative treatment options including shoe gear modification padding protecting offloading.  Also discussed surgical options as well which requires hammertoe arthroplasty with K wire fixation.  I discussed with patient he he she states understanding.  No follow-ups on file.

## 2023-12-19 ENCOUNTER — Other Ambulatory Visit (HOSPITAL_COMMUNITY): Payer: Self-pay

## 2023-12-19 DIAGNOSIS — L089 Local infection of the skin and subcutaneous tissue, unspecified: Secondary | ICD-10-CM | POA: Diagnosis not present

## 2023-12-20 ENCOUNTER — Other Ambulatory Visit (HOSPITAL_BASED_OUTPATIENT_CLINIC_OR_DEPARTMENT_OTHER): Payer: Self-pay

## 2023-12-20 ENCOUNTER — Other Ambulatory Visit (HOSPITAL_COMMUNITY): Payer: Self-pay

## 2023-12-20 MED ORDER — DOXYCYCLINE HYCLATE 100 MG PO TABS
100.0000 mg | ORAL_TABLET | Freq: Two times a day (BID) | ORAL | 0 refills | Status: DC
  Filled 2023-12-20: qty 28, 14d supply, fill #0

## 2023-12-21 ENCOUNTER — Other Ambulatory Visit (HOSPITAL_COMMUNITY): Payer: Self-pay

## 2023-12-22 ENCOUNTER — Other Ambulatory Visit (HOSPITAL_COMMUNITY): Payer: Self-pay

## 2023-12-22 MED ORDER — CLINDAMYCIN HCL 300 MG PO CAPS
600.0000 mg | ORAL_CAPSULE | Freq: Two times a day (BID) | ORAL | 0 refills | Status: AC
  Filled 2023-12-22: qty 40, 10d supply, fill #0

## 2023-12-28 ENCOUNTER — Other Ambulatory Visit (HOSPITAL_COMMUNITY): Payer: Self-pay

## 2024-01-16 ENCOUNTER — Other Ambulatory Visit (HOSPITAL_COMMUNITY): Payer: Self-pay

## 2024-01-17 ENCOUNTER — Ambulatory Visit: Admitting: Podiatry

## 2024-01-18 ENCOUNTER — Ambulatory Visit: Payer: PPO | Admitting: Podiatry

## 2024-01-20 ENCOUNTER — Other Ambulatory Visit (HOSPITAL_BASED_OUTPATIENT_CLINIC_OR_DEPARTMENT_OTHER): Payer: Self-pay

## 2024-01-20 ENCOUNTER — Other Ambulatory Visit (HOSPITAL_COMMUNITY): Payer: Self-pay

## 2024-01-20 DIAGNOSIS — M25531 Pain in right wrist: Secondary | ICD-10-CM | POA: Diagnosis not present

## 2024-01-20 MED ORDER — CEPHALEXIN 500 MG PO CAPS
500.0000 mg | ORAL_CAPSULE | Freq: Four times a day (QID) | ORAL | 0 refills | Status: DC
  Filled 2024-01-20 (×2): qty 20, 5d supply, fill #0

## 2024-01-23 ENCOUNTER — Other Ambulatory Visit (HOSPITAL_COMMUNITY): Payer: Self-pay

## 2024-01-23 DIAGNOSIS — M79644 Pain in right finger(s): Secondary | ICD-10-CM | POA: Diagnosis not present

## 2024-01-23 DIAGNOSIS — M25531 Pain in right wrist: Secondary | ICD-10-CM | POA: Diagnosis not present

## 2024-01-26 ENCOUNTER — Ambulatory Visit: Admitting: Podiatry

## 2024-01-26 DIAGNOSIS — M2041 Other hammer toe(s) (acquired), right foot: Secondary | ICD-10-CM

## 2024-01-26 NOTE — Progress Notes (Signed)
 Subjective:  Patient ID: Deanna Simmons, female    DOB: 1951/04/15,  MRN: [id number]  Chief Complaint  Patient presents with   Hammer Toe    73 y.o. female presents with the above complaint.  Patient presents with right second digit hammertoe contracture with underlying heloma durum.  Patient states been present for quite some time it progressively gets worse.  She had a shave but it has come back with a vengeance.  She wanted discuss treatment options for it including surgical plans.  Pain scale 7 out of 10 dull achy in nature she would like to discuss treatment options including surgical   Review of Systems: Negative except as noted in the HPI. Denies N/V/F/Ch.  Past Medical History:  Diagnosis Date   Abnormal finding on Pap smear, ASCUS    Anemia    Asthma    Chondromalacia of knee    Coronary artery disease    Diabetes mellitus without complication (HCC)    Diverticulum 12/11/2002   in cecum   Fibroids    h/o   GERD (gastroesophageal reflux disease)    H/O tinea cruris 02/28/2010   Heart murmur    History of CVA (cerebrovascular accident)    Hypercholesteremia    Hypertension    Lupus    Obesity    Pneumonia 07/01/2008   S/P TAVR (transcatheter aortic valve replacement) 07/13/2021   s/p TAVR w/ a 23mm Edwards S3U via the TF approach by Dr. Excell Seltzer and Dr. Laneta Simmers.   Synovitis of knee    Systemic lupus erythematosus (HCC)    with nephritis   Vulvitis    h/o    Current Outpatient Medications:    acetaminophen (TYLENOL) 325 MG tablet, Take 650 mg by mouth every 6 (six) hours as needed for moderate pain., Disp: , Rfl:    albuterol (VENTOLIN HFA) 108 (90 Base) MCG/ACT inhaler, Inhale 2 puffs into the lungs every 4 to 6 hours as needed for wheezing, coughing or shortness of breath., Disp: 6.7 g, Rfl: 1   azithromycin (ZITHROMAX) 500 MG tablet, Take one tablet by mouth1 hour before any dental work including cleanings., Disp: 6 tablet, Rfl: 12   azithromycin (ZITHROMAX) 500  MG tablet, Take 1 tablet by mouth 1 hour prior to dental treatment., Disp: 1 tablet, Rfl: 3   azithromycin (ZITHROMAX) 500 MG tablet, Take 1 tablet by mouth one hour prior to dental appointment., Disp: 4 tablet, Rfl: 3   beclomethasone (QVAR) 80 MCG/ACT inhaler, Inhale 1 puff into the lungs daily as needed (asthma)., Disp: , Rfl:    cephALEXin (KEFLEX) 500 MG capsule, Take 1 capsule (500 mg total) by mouth 4 (four) times daily., Disp: 20 capsule, Rfl: 0   cetirizine (ZYRTEC) 10 MG tablet, Take 1 tablet (10 mg total) by mouth daily as needed for allergies (Can use an extra dose during flares)., Disp: 60 tablet, Rfl: 5   chlorhexidine (HIBICLENS) 4 % external liquid, Apply 15 mLs (1 Application total) topically as directed for 30 doses. Use as directed daily for 5 days every other week for 6 weeks., Disp: 946 mL, Rfl: 1   clopidogrel (PLAVIX) 75 MG tablet, Take 1 tablet (75 mg total) by mouth daily., Disp: 90 tablet, Rfl: 1   dapagliflozin propanediol (FARXIGA) 10 MG TABS tablet, Take 1 tablet by mouth daily, Disp: 90 tablet, Rfl: 3   doxycycline (VIBRA-TABS) 100 MG tablet, Take 1 tablet (100 mg total) by mouth 2 (two) times daily for 14 days., Disp: 28 tablet, Rfl: 0  Estradiol 10 MCG TABS vaginal tablet, Place 1 tablet (10 mcg total) vaginally 2 (two) times a week as directed., Disp: 8 tablet, Rfl: 11   Evolocumab (REPATHA SURECLICK) 140 MG/ML SOAJ, Inject 140 mg into the skin every 14 (fourteen) days., Disp: 6 mL, Rfl: 3   famotidine (PEPCID) 40 MG tablet, Take 1 tablet (40 mg total) by mouth in the morning., Disp: 90 tablet, Rfl: 1   ferrous sulfate 325 (65 FE) MG tablet, Take 325 mg by mouth daily., Disp: , Rfl:    fluticasone (FLONASE) 50 MCG/ACT nasal spray, Use 1 spray in each nostril 3-7 times per week depending on disease activity., Disp: 48 g, Rfl: 1   Fluticasone Furoate (ARNUITY ELLIPTA) 200 MCG/ACT AEPB, Inhale 1 puff into the lungs 2 (two) times daily during asthma flare., Disp: 60 each,  Rfl: 5   fluticasone-salmeterol (ADVAIR) 250-50 MCG/ACT AEPB, Inhale 1 puff into the lungs in the morning and at bedtime., Disp: 60 each, Rfl: 5   glucose blood test strip, Use to test blood glucose twice daily before meals., Disp: 200 strip, Rfl: 3   ketoconazole (NIZORAL) 2 % cream, Apply to affected area twice a day for skin fungus infection for 14 days (Patient taking differently: Apply 1 Application topically daily as needed for irritation.), Disp: 30 g, Rfl: 1   methocarbamol (ROBAXIN) 500 MG tablet, Take 1 tablet (500 mg total) by mouth every 6 (six) hours as needed for muscle spasms., Disp: 40 tablet, Rfl: 0   metoprolol succinate (TOPROL-XL) 25 MG 24 hr tablet, Take 1 tablet (25 mg total) by mouth daily., Disp: 90 tablet, Rfl: 3   Multiple Vitamins-Minerals (MULTIVITAL PO), Take 1 tablet by mouth daily., Disp: , Rfl:    nitroGLYCERIN (NITROSTAT) 0.4 MG SL tablet, Place 0.4 mg under the tongue every 5 (five) minutes as needed for chest pain., Disp: , Rfl:    nitroGLYCERIN (NITROSTAT) 0.4 MG SL tablet, Place 1 tablet (0.4 mg total) under the tongue every 5 (five) minutes as needed for chest pain., Disp: 25 tablet, Rfl: 3   nystatin (MYCOSTATIN/NYSTOP) powder, Apply to affected area 2 times a day as needed for 7 days (Patient taking differently: 1 Application 2 (two) times daily as needed (irritation).), Disp: 30 g, Rfl: 1   ondansetron (ZOFRAN) 4 MG tablet, Take 1 tablet (4 mg total) by mouth every 6 (six) hours as needed for nausea. (Patient not taking: Reported on 12/07/2023), Disp: 20 tablet, Rfl: 0   oxyCODONE (OXY IR/ROXICODONE) 5 MG immediate release tablet, Take 1-2 tablets (5-10 mg total) by mouth every 6 (six) hours as needed for severe pain. (Patient not taking: Reported on 12/07/2023), Disp: 42 tablet, Rfl: 0   Probiotic Product (PROBIOTIC DAILY PO), Take 1 capsule by mouth at bedtime. Ultra Flora IB, Disp: , Rfl:    rosuvastatin (CRESTOR) 40 MG tablet, Take 1 tablet (40 mg total) by mouth  daily., Disp: 90 tablet, Rfl: 3   Tiotropium Bromide Monohydrate (SPIRIVA RESPIMAT) 1.25 MCG/ACT AERS, Take 2 Inhalations by mouth daily as needed., Disp: 4 g, Rfl: 5   tiZANidine (ZANAFLEX) 4 MG tablet, Take 1 tablet (4 mg total) by mouth 3 (three) times daily as needed for muscle spasm (Patient not taking: Reported on 09/26/2023), Disp: 30 tablet, Rfl: 0   traMADol (ULTRAM) 50 MG tablet, Take 1-2 tablets (50-100 mg total) by mouth every 6 (six) hours as needed for moderate pain. (Patient not taking: Reported on 12/07/2023), Disp: 40 tablet, Rfl: 0   triamcinolone (  KENALOG) 0.025 % cream, Apply to affected area once a day as directed for 10 days., Disp: 15 g, Rfl: 0   triamcinolone ointment (KENALOG) 0.1 %, Apply once a daily in the evening for 2-4 weeks (Patient taking differently: Apply 1 Application topically daily as needed (irritation).), Disp: 60 g, Rfl: 0   valACYclovir (VALTREX) 500 MG tablet, TAKE 1 TABLET BY MOUTH TWICE DAILY AS NEEDED, Disp: 60 tablet, Rfl: 3   vitamin C (ASCORBIC ACID) 500 MG tablet, Take 500 mg by mouth daily., Disp: , Rfl:    Vitamin D-Vitamin K (VITAMIN K2-VITAMIN D3 PO), Take 1 tablet by mouth daily., Disp: , Rfl:    zinc gluconate 50 MG tablet, Take 50 mg by mouth every evening., Disp: , Rfl:   Current Facility-Administered Medications:    azithromycin (ZITHROMAX) tablet 500 mg, 500 mg, Oral, Once,   Social History   Tobacco Use  Smoking Status Former  Smokeless Tobacco Never    Allergies  Allergen Reactions   Cardiolite [Technetium-36m] Itching   Ciprofloxacin Itching   Clarithromycin Itching   Erythromycin Base Rash   Iodinated Contrast Media Itching    Pt has a potential contrast allergy; She states in 2012 her cardiologist told her she had a reaction during the "study". We are unable to determine the study or confirm a reaction. The patient will have a 13 hr prep for future studies. Molecular Imaging states their injection does not cause any reactions.    Niacin Itching   Niaspan [Niacin Er (Antihyperlipidemic)] Other (See Comments)    flushing   Other Itching    Chemical Use And Stress Cardiolite Testing (Uncoded)   Penicillins Hives, Itching and Rash    Reaction: over 10 years Tolerated Cephalosporin Date: 07/11/23.     Prilosec [Omeprazole] Itching   Singulair [Montelukast] Hives and Itching   Ticlid [Ticlopidine Hcl] Itching   Objective:  There were no vitals filed for this visit. There is no height or weight on file to calculate BMI. Constitutional Well developed. Well nourished.  Vascular Dorsalis pedis pulses palpable bilaterally. Posterior tibial pulses palpable bilaterally. Capillary refill normal to all digits.  No cyanosis or clubbing noted. Pedal hair growth normal.  Neurologic Normal speech. Oriented to person, place, and time. Epicritic sensation to light touch grossly present bilaterally.  Dermatologic Nails well groomed and normal in appearance. No open wounds. No skin lesions.  Orthopedic: Right second digit hammertoe contracture semiflexible in nature.  Some joint contracture noted at the second metatarsophalangeal joint.  No other abnormalities identified   Radiographs: None Assessment:  No diagnosis found. Plan:  Patient was evaluated and treated and all questions answered.  Right second digit hammertoe contracture -All questions and concerns were discussed with the patient in extensive detail given the amount of pain that I discussed with her all conservative treatment options including shoe gear modification padding protecting offloading.  Also discussed surgical options as well which requires hammertoe arthroplasty with K wire fixation.  I discussed with patient he he she states understanding.  No follow-ups on file.

## 2024-02-06 ENCOUNTER — Other Ambulatory Visit: Payer: Self-pay

## 2024-02-06 ENCOUNTER — Other Ambulatory Visit (HOSPITAL_COMMUNITY): Payer: Self-pay

## 2024-02-12 ENCOUNTER — Other Ambulatory Visit (HOSPITAL_COMMUNITY): Payer: Self-pay

## 2024-02-13 ENCOUNTER — Other Ambulatory Visit (HOSPITAL_COMMUNITY): Payer: Self-pay

## 2024-02-13 ENCOUNTER — Telehealth: Payer: Self-pay | Admitting: Pharmacy Technician

## 2024-02-13 NOTE — Telephone Encounter (Signed)
 Pharmacy Patient Advocate Encounter  Received notification from [employer] that Prior Authorization for repatha has been APPROVED from 02/13/24 to 08/14/24. Ran test claim, Copay is $47.00- one month. This test claim was processed through [facility]- copay amounts may vary at other pharmacies due to pharmacy/plan contracts, or as the patient moves through the different stages of their insurance plan.   PA #/Case ID/Reference #: [id number]

## 2024-02-13 NOTE — Telephone Encounter (Signed)
 Pharmacy Patient Advocate Encounter   Received notification from CoverMyMeds that prior authorization for repatha is required/requested.   Insurance verification completed.   The patient is insured through [facility] [employer] .   Per test claim: PA required; PA submitted to above mentioned insurance via CoverMyMeds Key/confirmation #/EOC [id number] Status is pending

## 2024-02-16 ENCOUNTER — Other Ambulatory Visit (HOSPITAL_COMMUNITY): Payer: Self-pay

## 2024-02-29 ENCOUNTER — Ambulatory Visit: Admitting: Podiatry

## 2024-02-29 ENCOUNTER — Encounter: Payer: Self-pay | Admitting: Podiatry

## 2024-02-29 DIAGNOSIS — L84 Corns and callosities: Secondary | ICD-10-CM

## 2024-02-29 DIAGNOSIS — M21619 Bunion of unspecified foot: Secondary | ICD-10-CM

## 2024-02-29 DIAGNOSIS — M2041 Other hammer toe(s) (acquired), right foot: Secondary | ICD-10-CM

## 2024-02-29 DIAGNOSIS — M779 Enthesopathy, unspecified: Secondary | ICD-10-CM

## 2024-02-29 NOTE — Progress Notes (Signed)
 This patient prese nts to the office with painful second toe right foot.  Patient says her second toe is contracted and forms a painful corn on the second toe.  She was previously seen by Dr.  Lois Rink  and Dr.  Lydia Sams for this condition.  she returns to the office for evaluation and treatment.  General Appearance  Alert, conversant and in no acute stress.  Vascular  Dorsalis pedis and posterior tibial  pulses are palpable  bilaterally.  Capillary return is within normal limits  bilaterally. Temperature is within normal limits  bilaterally.  Neurologic  Senn-Weinstein monofilament wire test within normal limits  bilaterally. Muscle power within normal limits bilaterally.  Nails Thick disfigured discolored nails with subungual debris  from hallux to fifth toes bilaterally. No evidence of bacterial infection or drainage bilaterally.  Orthopedic  No limitations of motion  feet .  No crepitus or effusions noted.HAV  B/L.  Rigid second toe right foot.  Skin  normotropic skin with no porokeratosis noted bilaterally.  No signs of infections or ulcers noted.     Heloma durum second toe right foot.  Injection second toe right foot .Debride HD.  Discussed surgical correction of second toe. Right.  Opted to inject this toe since it was painful during debridement. Injection  o.5 cc of lidocaine  and 0.5 cc of kenalog  10.  RTC prn  Ruffin Cotton DPM

## 2024-03-02 ENCOUNTER — Other Ambulatory Visit (HOSPITAL_COMMUNITY): Payer: Self-pay

## 2024-03-04 ENCOUNTER — Other Ambulatory Visit (HOSPITAL_COMMUNITY): Payer: Self-pay

## 2024-03-08 ENCOUNTER — Other Ambulatory Visit (HOSPITAL_COMMUNITY): Payer: Self-pay

## 2024-03-09 ENCOUNTER — Other Ambulatory Visit (HOSPITAL_COMMUNITY): Payer: Self-pay

## 2024-03-14 ENCOUNTER — Other Ambulatory Visit (HOSPITAL_COMMUNITY): Payer: Self-pay

## 2024-03-19 ENCOUNTER — Other Ambulatory Visit (HOSPITAL_COMMUNITY): Payer: Self-pay

## 2024-03-19 ENCOUNTER — Ambulatory Visit: Payer: Medicare Other | Admitting: Allergy and Immunology

## 2024-03-19 VITALS — BP 110/70 | HR 70 | Temp 98.1°F | Resp 16 | Ht 62.75 in | Wt 196.9 lb

## 2024-03-19 DIAGNOSIS — J4540 Moderate persistent asthma, uncomplicated: Secondary | ICD-10-CM | POA: Diagnosis not present

## 2024-03-19 DIAGNOSIS — J301 Allergic rhinitis due to pollen: Secondary | ICD-10-CM

## 2024-03-19 DIAGNOSIS — J3089 Other allergic rhinitis: Secondary | ICD-10-CM | POA: Diagnosis not present

## 2024-03-19 DIAGNOSIS — K219 Gastro-esophageal reflux disease without esophagitis: Secondary | ICD-10-CM | POA: Diagnosis not present

## 2024-03-19 MED ORDER — SPIRIVA RESPIMAT 1.25 MCG/ACT IN AERS
2.0000 | INHALATION_SPRAY | Freq: Every morning | RESPIRATORY_TRACT | 1 refills | Status: AC
  Filled 2024-03-19: qty 12, 90d supply, fill #0

## 2024-03-19 MED ORDER — FLUTICASONE-SALMETEROL 250-50 MCG/ACT IN AEPB
1.0000 | INHALATION_SPRAY | Freq: Two times a day (BID) | RESPIRATORY_TRACT | 5 refills | Status: DC
  Filled 2024-03-19: qty 60, 30d supply, fill #0

## 2024-03-19 MED ORDER — FAMOTIDINE 40 MG PO TABS
40.0000 mg | ORAL_TABLET | Freq: Every day | ORAL | 1 refills | Status: DC | PRN
  Filled 2024-03-19: qty 180, 180d supply, fill #0

## 2024-03-19 MED ORDER — ALBUTEROL SULFATE HFA 108 (90 BASE) MCG/ACT IN AERS
2.0000 | INHALATION_SPRAY | RESPIRATORY_TRACT | 1 refills | Status: DC | PRN
  Filled 2024-03-19: qty 6.7, 17d supply, fill #0

## 2024-03-19 MED ORDER — FLUTICASONE PROPIONATE 50 MCG/ACT NA SUSP
NASAL | 1 refills | Status: DC
  Filled 2024-03-19: qty 48, 90d supply, fill #0

## 2024-03-19 MED ORDER — CETIRIZINE HCL 10 MG PO TABS
10.0000 mg | ORAL_TABLET | Freq: Every day | ORAL | 1 refills | Status: DC | PRN
  Filled 2024-03-19: qty 180, 180d supply, fill #0

## 2024-03-19 MED ORDER — ARNUITY ELLIPTA 200 MCG/ACT IN AEPB
1.0000 | INHALATION_SPRAY | Freq: Two times a day (BID) | RESPIRATORY_TRACT | 0 refills | Status: AC
  Filled 2024-03-19: qty 90, 90d supply, fill #0

## 2024-03-19 NOTE — Progress Notes (Signed)
 [place] - [place] - [place] - [place] - [place]   Follow-up Note  Referring Provider: Tena Feeling, MD Primary Provider: Tena Feeling, MD Date of Office Visit: 03/19/2024  Subjective:   Deanna Simmons (DOB: May 05, 1951) is a 73 y.o. female who [facility] in re-evaluation of the following:  HPI: Deanna Simmons returns to this clinic in evaluation of asthma, allergic rhinitis, LPR.  I last saw her in this clinic 26 September 2023.  She has really done well since I have seen her in this clinic last and has not required a systemic steroid or an antibiotic for any type of airway issue.  She continues to use her Advair  just 1 time per day and she continues to use her Flonase  at 1 spray each nostril 1 time per day.  Rarely does she use the short acting bronchodilator.  She is back to work after her left knee surgery and she walks several miles a day as part of her work without any difficulty.  Her reflux is under very good control on her current plan.  Allergies as of 03/19/2024       Reactions   Cardiolite  [technetium-14m] Itching   Ciprofloxacin  Itching   Clarithromycin Itching   Erythromycin Base Rash   Iodinated Contrast Media Itching   Pt has a potential contrast allergy; She states in 2012 her cardiologist told her she had a reaction during the "study". We are unable to determine the study or confirm a reaction. The patient will have a 13 hr prep for future studies. Molecular Imaging states their injection does not cause any reactions.   Niacin Itching   Niaspan [niacin Er (antihyperlipidemic)] Other (See Comments)   flushing   Other Itching   Chemical Use And Stress Cardiolite  Testing (Uncoded)   Penicillins Hives, Itching, Rash   Reaction: over 10 years Tolerated Cephalosporin Date: 07/11/23.   Prilosec [omeprazole] Itching   Singulair  [montelukast ] Hives, Itching   Ticlid [ticlopidine Hcl] Itching        Medication List     albuterol  108 (90 Base) MCG/ACT inhaler Commonly known as: Ventolin  HFA Inhale 2 puffs into the lungs every 4 to 6 hours as needed for wheezing, coughing or shortness of breath.   Arnuity Ellipta  200 MCG/ACT Aepb Generic drug: Fluticasone  Furoate Inhale 1 puff into the lungs 2 (two) times daily during asthma flare.   ascorbic acid 500 MG tablet Commonly known as: VITAMIN C Take 500 mg by mouth daily.   azithromycin  500 MG tablet Commonly known as: ZITHROMAX  Take 1 tablet by mouth 1 hour prior to dental treatment.   beclomethasone 80 MCG/ACT inhaler Commonly known as: QVAR  Inhale 1 puff into the lungs daily as needed (asthma).   cetirizine  10 MG tablet Commonly known as: ZYRTEC  Take 1 tablet (10 mg total) by mouth daily as needed for allergies (Can use an extra dose during flares).   clopidogrel  75 MG tablet Commonly known as: PLAVIX  Take 1 tablet (75 mg total) by mouth daily.   Estradiol  10 MCG Tabs vaginal tablet Place 1 tablet (10 mcg total) vaginally 2 (two) times a week as directed.   famotidine  40 MG tablet Commonly known as: PEPCID  Take 1 tablet (40 mg total) by mouth in the morning.   Farxiga  10 MG Tabs tablet Generic drug: dapagliflozin  propanediol Take 1 tablet by mouth daily   ferrous sulfate  325 (65 FE) MG tablet Take 325 mg by mouth daily.   fluticasone   50 MCG/ACT nasal spray Commonly known as: FLONASE  Use 1 spray in each nostril 3-7 times per week depending on disease activity.   fluticasone -salmeterol 250-50 MCG/ACT Aepb Commonly known as: ADVAIR  Inhale 1 puff into the lungs in the morning and at bedtime.   glucose blood test strip Use to test blood glucose twice daily before meals.   metoprolol  succinate 25 MG 24 hr tablet Commonly known as: TOPROL -XL Take 1 tablet (25 mg total) by mouth daily.   MULTIVITAL PO Take 1 tablet by mouth daily.   nitroGLYCERIN  0.4 MG SL tablet Commonly known as: NITROSTAT  Place 0.4 mg under the tongue  every 5 (five) minutes as needed for chest pain.   nitroGLYCERIN  0.4 MG SL tablet Commonly known as: NITROSTAT  Place 1 tablet (0.4 mg total) under the tongue every 5 (five) minutes as needed for chest pain.   nystatin  powder Commonly known as: MYCOSTATIN /NYSTOP  Apply to affected area 2 times a day as needed for 7 days   PROBIOTIC DAILY PO Take 1 capsule by mouth at bedtime. Ultra Flora IB   Repatha  SureClick 140 MG/ML Soaj Generic drug: Evolocumab  Inject 140 mg into the skin every 14 (fourteen) days.   rosuvastatin  40 MG tablet Commonly known as: CRESTOR  Take 1 tablet (40 mg total) by mouth daily.   Spiriva  Respimat 1.25 MCG/ACT Aers Generic drug: Tiotropium Bromide  Monohydrate Take 2 Inhalations by mouth daily as needed.   triamcinolone  ointment 0.1 % Commonly known as: KENALOG  Apply once a daily in the evening for 2-4 weeks What changed:  how much to take when to take this reasons to take this   triamcinolone  0.025 % cream Commonly known as: KENALOG  Apply to affected area once a day as directed for 10 days.   valACYclovir  500 MG tablet Commonly known as: VALTREX  TAKE 1 TABLET BY MOUTH TWICE DAILY AS NEEDED   VITAMIN K2-VITAMIN D3 PO Take 1 tablet by mouth daily.   zinc  gluconate 50 MG tablet Take 50 mg by mouth every evening.    Past Medical History:  Diagnosis Date   Abnormal finding on Pap smear, ASCUS    Anemia    Asthma    Chondromalacia of knee    Coronary artery disease    Diabetes mellitus without complication (HCC)    Diverticulum 12/11/2002   in cecum   Fibroids    h/o   GERD (gastroesophageal reflux disease)    H/O tinea cruris 02/28/2010   Heart murmur    History of CVA (cerebrovascular accident)    Hypercholesteremia    Hypertension    Lupus    Obesity    Pneumonia 07/01/2008   S/P TAVR (transcatheter aortic valve replacement) 07/13/2021   s/p TAVR w/ a 23mm Edwards S3U via the TF approach by Dr. Arlester Ladd and Dr. Sherene Dilling.   Synovitis  of knee    Systemic lupus erythematosus (HCC)    with nephritis   Vulvitis    h/o    Past Surgical History:  Procedure Laterality Date   CARDIAC CATHETERIZATION     CATARACT EXTRACTION, BILATERAL     CESAREAN SECTION     CORONARY ARTERY BYPASS GRAFT     Endometrial curettage  03/29/2000   INTRAOPERATIVE TRANSTHORACIC ECHOCARDIOGRAM Left 07/13/2021   Procedure: INTRAOPERATIVE TRANSTHORACIC ECHOCARDIOGRAM;  Surgeon: Arnoldo Lapping, MD;  Location: [facility] OR;  Service: Open Heart Surgery;  Laterality: Left;   RIGHT/LEFT HEART CATH AND CORONARY/GRAFT ANGIOGRAPHY N/A 06/11/2021   Procedure: RIGHT/LEFT HEART CATH AND CORONARY/GRAFT ANGIOGRAPHY;  Surgeon: Kay Parson  W, MD;  Location: [facility] INVASIVE CV LAB;  Service: Cardiovascular;  Laterality: N/A;   ROTATOR CUFF REPAIR Right    TOTAL KNEE ARTHROPLASTY Left 07/10/2023   Procedure: TOTAL KNEE ARTHROPLASTY;  Surgeon: Liliane Rei, MD;  Location: [facility] ORS;  Service: Orthopedics;  Laterality: Left;   TRANSCATHETER AORTIC VALVE REPLACEMENT, TRANSFEMORAL N/A 07/13/2021   Procedure: TRANSCATHETER AORTIC VALVE REPLACEMENT, TRANSFEMORAL;  Surgeon: Arnoldo Lapping, MD;  Location: [facility] OR;  Service: Open Heart Surgery;  Laterality: N/A;   TUBAL LIGATION     ULTRASOUND GUIDANCE FOR VASCULAR ACCESS Bilateral 07/13/2021   Procedure: ULTRASOUND GUIDANCE FOR VASCULAR ACCESS;  Surgeon: Arnoldo Lapping, MD;  Location: [facility] OR;  Service: Open Heart Surgery;  Laterality: Bilateral;   UPPER GASTROINTESTINAL ENDOSCOPY  12/11/2002    Review of systems negative except as noted in HPI / PMHx or noted below:  Review of Systems  Constitutional: Negative.   HENT: Negative.    Eyes: Negative.   Respiratory: Negative.    Cardiovascular: Negative.   Gastrointestinal: Negative.   Genitourinary: Negative.   Musculoskeletal: Negative.   Skin: Negative.   Neurological: Negative.   Endo/Heme/Allergies: Negative.   Psychiatric/Behavioral: Negative.       Objective:    Vitals:   03/19/24 1640  BP: 110/70  Pulse: 70  Resp: 16  Temp: 98.1 F (36.7 C)  SpO2: 98%   Height: 5' 2.75" (159.4 cm)  Weight: 196 lb 14.4 oz (89.3 kg)   Physical Exam Constitutional:      Appearance: She is not diaphoretic.  HENT:     Head: Normocephalic.     Right Ear: Tympanic membrane, ear canal and external ear normal.     Left Ear: Tympanic membrane, ear canal and external ear normal.     Nose: Nose normal. No mucosal edema or rhinorrhea.     Mouth/Throat:     Pharynx: Uvula midline. No oropharyngeal exudate.  Eyes:     Conjunctiva/sclera: Conjunctivae normal.  Neck:     Thyroid : No thyromegaly.     Trachea: Trachea normal. No tracheal tenderness or tracheal deviation.  Cardiovascular:     Rate and Rhythm: Normal rate and regular rhythm.     Heart sounds: Normal heart sounds, S1 normal and S2 normal. No murmur heard. Pulmonary:     Effort: No respiratory distress.     Breath sounds: Normal breath sounds. No stridor. No wheezing or rales.  Lymphadenopathy:     Head:     Right side of head: No tonsillar adenopathy.     Left side of head: No tonsillar adenopathy.     Cervical: No cervical adenopathy.  Skin:    Findings: No erythema or rash.     Nails: There is no clubbing.  Neurological:     Mental Status: She is alert.     Diagnostics: Spirometry was performed and demonstrated an FEV1 of 2.05 at 120 % of predicted.  Assessment and Plan:   1. Asthma, moderate persistent, well-controlled   2. Perennial allergic rhinitis   3. Seasonal allergic rhinitis due to pollen   4. LPRD (laryngopharyngeal reflux disease)    1.  Continue Advair  250 1 inhalation 1-2 times per day    2.  Continue Flonase  1 spray each nostril 3-7 times per week    3.  Continue Famotidine  40 mg tablet 1 time per day  4.  "Action plan" for asthma flare:   A. Add Arnuity 200 - 1 inhalation 2 times per day  B. Use Albuterol  HFA -  2 puffs every 4-6 hours if needed  C. Increase  famotidine  to 40 mg 2 times per day  D. Spiriva  1.25 Respimat - 2 inhalations 1 time per day   5. If needed:  A. Zyrtec  10 mg one tablet one time per day  B. Nasal saline  6. Influenza = Tamiflu. Covid = Paxlovid  7. Return to clinic in 6 months or earlier if problem  Jawanna appears to be doing very well with her plan of anti-inflammatory agents for both her upper and lower airway and therapy directed against reflux.  She will continue on this plan and she has a selection of agents to be utilized should they be required as noted above.  I will see her back in this clinic in 6 months or earlier if there is a problem.  Schuyler Custard, MD Allergy / Immunology [facility]

## 2024-03-19 NOTE — Patient Instructions (Addendum)
  1.  Continue Advair  250 1 inhalation 1-2 times per day    2.  Continue Flonase  1 spray each nostril 3-7 times per week    3.  Continue Famotidine  40 mg tablet 1 time per day  4.  "Action plan" for asthma flare:   A. Add Arnuity 200 - 1 inhalation 2 times per day  B. Use Albuterol  HFA -2 puffs every 4-6 hours if needed  C. Increase famotidine  to 40 mg 2 times per day  D. Spiriva  1.25 Respimat - 2 inhalations 1 time per day   5. If needed:  A. Zyrtec  10 mg one tablet one time per day  B. Nasal saline  6. Influenza = Tamiflu. Covid = Paxlovid  7. Return to clinic in 6 months or earlier if problem

## 2024-03-20 ENCOUNTER — Encounter: Payer: Self-pay | Admitting: Allergy and Immunology

## 2024-03-20 ENCOUNTER — Other Ambulatory Visit (HOSPITAL_COMMUNITY): Payer: Self-pay

## 2024-03-21 ENCOUNTER — Other Ambulatory Visit (HOSPITAL_COMMUNITY): Payer: Self-pay

## 2024-03-21 DIAGNOSIS — N181 Chronic kidney disease, stage 1: Secondary | ICD-10-CM | POA: Diagnosis not present

## 2024-03-21 DIAGNOSIS — M3214 Glomerular disease in systemic lupus erythematosus: Secondary | ICD-10-CM | POA: Diagnosis not present

## 2024-03-21 DIAGNOSIS — N189 Chronic kidney disease, unspecified: Secondary | ICD-10-CM | POA: Diagnosis not present

## 2024-03-21 DIAGNOSIS — I129 Hypertensive chronic kidney disease with stage 1 through stage 4 chronic kidney disease, or unspecified chronic kidney disease: Secondary | ICD-10-CM | POA: Diagnosis not present

## 2024-03-21 DIAGNOSIS — E119 Type 2 diabetes mellitus without complications: Secondary | ICD-10-CM | POA: Diagnosis not present

## 2024-03-21 DIAGNOSIS — D631 Anemia in chronic kidney disease: Secondary | ICD-10-CM | POA: Diagnosis not present

## 2024-03-26 ENCOUNTER — Other Ambulatory Visit (HOSPITAL_COMMUNITY): Payer: Self-pay

## 2024-03-29 ENCOUNTER — Telehealth: Payer: Self-pay | Admitting: Cardiovascular Disease

## 2024-03-29 DIAGNOSIS — E782 Mixed hyperlipidemia: Secondary | ICD-10-CM

## 2024-03-29 DIAGNOSIS — Z8673 Personal history of transient ischemic attack (TIA), and cerebral infarction without residual deficits: Secondary | ICD-10-CM

## 2024-03-29 DIAGNOSIS — I25708 Atherosclerosis of coronary artery bypass graft(s), unspecified, with other forms of angina pectoris: Secondary | ICD-10-CM

## 2024-03-29 NOTE — Telephone Encounter (Signed)
*  STAT* If patient is at the pharmacy, call can be transferred to refill team.   1. Which medications need to be refilled? (please list name of each medication and dose if known) Evolocumab  (REPATHA  SURECLICK) 140 MG/ML SOAJ   2. Which pharmacy/location (including street and city if local pharmacy) is medication to be sent to? [id number], [place] - [address]   3. Do they need a 30 day or 90 day supply? 90

## 2024-03-30 DIAGNOSIS — E1142 Type 2 diabetes mellitus with diabetic polyneuropathy: Secondary | ICD-10-CM | POA: Diagnosis not present

## 2024-03-30 DIAGNOSIS — J45909 Unspecified asthma, uncomplicated: Secondary | ICD-10-CM | POA: Diagnosis not present

## 2024-03-30 DIAGNOSIS — I2510 Atherosclerotic heart disease of native coronary artery without angina pectoris: Secondary | ICD-10-CM | POA: Diagnosis not present

## 2024-03-30 DIAGNOSIS — E119 Type 2 diabetes mellitus without complications: Secondary | ICD-10-CM | POA: Diagnosis not present

## 2024-04-01 ENCOUNTER — Other Ambulatory Visit (HOSPITAL_COMMUNITY): Payer: Self-pay

## 2024-04-01 DIAGNOSIS — E1142 Type 2 diabetes mellitus with diabetic polyneuropathy: Secondary | ICD-10-CM | POA: Diagnosis not present

## 2024-04-01 DIAGNOSIS — E119 Type 2 diabetes mellitus without complications: Secondary | ICD-10-CM | POA: Diagnosis not present

## 2024-04-01 DIAGNOSIS — I10 Essential (primary) hypertension: Secondary | ICD-10-CM | POA: Diagnosis not present

## 2024-04-01 DIAGNOSIS — I2510 Atherosclerotic heart disease of native coronary artery without angina pectoris: Secondary | ICD-10-CM | POA: Diagnosis not present

## 2024-04-02 MED ORDER — REPATHA SURECLICK 140 MG/ML ~~LOC~~ SOAJ: 140.0000 mg | SUBCUTANEOUS | 0 refills | Status: DC

## 2024-04-03 ENCOUNTER — Other Ambulatory Visit (HOSPITAL_COMMUNITY): Payer: Self-pay

## 2024-04-16 ENCOUNTER — Telehealth: Payer: Self-pay | Admitting: Cardiovascular Disease

## 2024-04-16 NOTE — Telephone Encounter (Signed)
 Left message to call back

## 2024-04-16 NOTE — Telephone Encounter (Signed)
 Pt returning call to a nurse

## 2024-04-16 NOTE — Telephone Encounter (Signed)
 Pt c/o medication issue:  1. Name of Medication: Evolocumab  (REPATHA  SURECLICK) 140 MG/ML SOAJ   2. How are you currently taking this medication (dosage and times per day)? As written  3. Are you having a reaction (difficulty breathing--STAT)? No   4. What is your medication issue? Pt was suppose to take a dose on 6/13 and pt didn't do it. So she wants to know when she should take it.

## 2024-04-17 NOTE — Telephone Encounter (Signed)
 She can take today and keep her same schedule

## 2024-04-17 NOTE — Telephone Encounter (Signed)
 Spoke with patient and she is aware she can take her dose today and keep her same schedule per pharmd

## 2024-04-26 DIAGNOSIS — M79674 Pain in right toe(s): Secondary | ICD-10-CM | POA: Diagnosis not present

## 2024-04-28 DIAGNOSIS — E109 Type 1 diabetes mellitus without complications: Secondary | ICD-10-CM | POA: Diagnosis not present

## 2024-04-28 DIAGNOSIS — M25561 Pain in right knee: Secondary | ICD-10-CM | POA: Diagnosis not present

## 2024-04-29 DIAGNOSIS — M2041 Other hammer toe(s) (acquired), right foot: Secondary | ICD-10-CM | POA: Diagnosis not present

## 2024-04-29 DIAGNOSIS — M2011 Hallux valgus (acquired), right foot: Secondary | ICD-10-CM | POA: Diagnosis not present

## 2024-04-29 DIAGNOSIS — M2021 Hallux rigidus, right foot: Secondary | ICD-10-CM | POA: Diagnosis not present

## 2024-04-30 DIAGNOSIS — I2510 Atherosclerotic heart disease of native coronary artery without angina pectoris: Secondary | ICD-10-CM | POA: Diagnosis not present

## 2024-04-30 DIAGNOSIS — I10 Essential (primary) hypertension: Secondary | ICD-10-CM | POA: Diagnosis not present

## 2024-04-30 DIAGNOSIS — E1142 Type 2 diabetes mellitus with diabetic polyneuropathy: Secondary | ICD-10-CM | POA: Diagnosis not present

## 2024-04-30 DIAGNOSIS — E119 Type 2 diabetes mellitus without complications: Secondary | ICD-10-CM | POA: Diagnosis not present

## 2024-05-04 DIAGNOSIS — M1711 Unilateral primary osteoarthritis, right knee: Secondary | ICD-10-CM | POA: Diagnosis not present

## 2024-05-08 ENCOUNTER — Other Ambulatory Visit (HOSPITAL_COMMUNITY): Payer: Self-pay

## 2024-05-14 ENCOUNTER — Ambulatory Visit: Admitting: Podiatry

## 2024-05-14 ENCOUNTER — Other Ambulatory Visit (HOSPITAL_COMMUNITY): Payer: Self-pay

## 2024-05-14 DIAGNOSIS — M1990 Unspecified osteoarthritis, unspecified site: Secondary | ICD-10-CM | POA: Diagnosis not present

## 2024-05-14 DIAGNOSIS — E1169 Type 2 diabetes mellitus with other specified complication: Secondary | ICD-10-CM | POA: Diagnosis not present

## 2024-05-14 DIAGNOSIS — E669 Obesity, unspecified: Secondary | ICD-10-CM | POA: Diagnosis not present

## 2024-05-14 DIAGNOSIS — K219 Gastro-esophageal reflux disease without esophagitis: Secondary | ICD-10-CM | POA: Diagnosis not present

## 2024-05-14 DIAGNOSIS — D509 Iron deficiency anemia, unspecified: Secondary | ICD-10-CM | POA: Diagnosis not present

## 2024-05-14 DIAGNOSIS — E1121 Type 2 diabetes mellitus with diabetic nephropathy: Secondary | ICD-10-CM | POA: Diagnosis not present

## 2024-05-14 DIAGNOSIS — E785 Hyperlipidemia, unspecified: Secondary | ICD-10-CM | POA: Diagnosis not present

## 2024-05-14 DIAGNOSIS — M329 Systemic lupus erythematosus, unspecified: Secondary | ICD-10-CM | POA: Diagnosis not present

## 2024-05-14 DIAGNOSIS — J309 Allergic rhinitis, unspecified: Secondary | ICD-10-CM | POA: Diagnosis not present

## 2024-05-14 DIAGNOSIS — J45909 Unspecified asthma, uncomplicated: Secondary | ICD-10-CM | POA: Diagnosis not present

## 2024-05-14 DIAGNOSIS — G8929 Other chronic pain: Secondary | ICD-10-CM | POA: Diagnosis not present

## 2024-05-14 DIAGNOSIS — I25119 Atherosclerotic heart disease of native coronary artery with unspecified angina pectoris: Secondary | ICD-10-CM | POA: Diagnosis not present

## 2024-05-15 ENCOUNTER — Other Ambulatory Visit: Payer: Self-pay

## 2024-05-15 ENCOUNTER — Other Ambulatory Visit (HOSPITAL_COMMUNITY): Payer: Self-pay

## 2024-05-15 MED ORDER — CLOPIDOGREL BISULFATE 75 MG PO TABS: 75.0000 mg | ORAL_TABLET | Freq: Every day | ORAL | 1 refills | Status: AC

## 2024-05-16 DIAGNOSIS — M1611 Unilateral primary osteoarthritis, right hip: Secondary | ICD-10-CM | POA: Diagnosis not present

## 2024-05-16 DIAGNOSIS — M25551 Pain in right hip: Secondary | ICD-10-CM | POA: Diagnosis not present

## 2024-05-16 DIAGNOSIS — M1711 Unilateral primary osteoarthritis, right knee: Secondary | ICD-10-CM | POA: Diagnosis not present

## 2024-05-22 DIAGNOSIS — M25551 Pain in right hip: Secondary | ICD-10-CM | POA: Diagnosis not present

## 2024-05-23 ENCOUNTER — Ambulatory Visit: Payer: Self-pay | Attending: Cardiovascular Disease | Admitting: Cardiovascular Disease

## 2024-05-23 ENCOUNTER — Encounter: Payer: Self-pay | Admitting: Cardiovascular Disease

## 2024-05-23 VITALS — BP 124/75 | HR 66 | Ht 64.0 in | Wt 197.0 lb

## 2024-05-23 DIAGNOSIS — Z952 Presence of prosthetic heart valve: Secondary | ICD-10-CM

## 2024-05-23 DIAGNOSIS — Z01810 Encounter for preprocedural cardiovascular examination: Secondary | ICD-10-CM | POA: Diagnosis not present

## 2024-05-23 DIAGNOSIS — I10 Essential (primary) hypertension: Secondary | ICD-10-CM

## 2024-05-23 DIAGNOSIS — E782 Mixed hyperlipidemia: Secondary | ICD-10-CM | POA: Diagnosis not present

## 2024-05-23 DIAGNOSIS — I25708 Atherosclerosis of coronary artery bypass graft(s), unspecified, with other forms of angina pectoris: Secondary | ICD-10-CM

## 2024-05-23 DIAGNOSIS — M1711 Unilateral primary osteoarthritis, right knee: Secondary | ICD-10-CM | POA: Diagnosis not present

## 2024-05-23 NOTE — Patient Instructions (Signed)
 Testing/Procedures: ECHO (in 1 year, prior to visit) Your physician has requested that you have an echocardiogram. Echocardiography is a painless test that uses sound waves to create images of your heart. It provides your doctor with information about the size and shape of your heart and how well your heart's chambers and valves are working. This procedure takes approximately one hour. There are no restrictions for this procedure. Please do NOT wear cologne, perfume, aftershave, or lotions (deodorant is allowed). Please arrive 15 minutes prior to your appointment time.  Please note: We ask at that you not bring children with you during ultrasound (echo/ vascular) testing. Due to room size and safety concerns, children are not allowed in the ultrasound rooms during exams. Our front office staff cannot provide observation of children in our lobby area while testing is being conducted. An adult accompanying a patient to their appointment will only be allowed in the ultrasound room at the discretion of the ultrasound technician under special circumstances. We apologize for any inconvenience.  Follow-Up: At [facility], you and your health needs are our priority.  As part of our continuing mission to provide you with exceptional heart care, our providers are all part of one team.  This team includes your primary Cardiologist (physician) and Advanced Practice Providers or APPs (Physician Assistants and Nurse Practitioners) who all work together to provide you with the care you need, when you need it.  Your next appointment:   1 year(s)  Provider:   Arnoldo Lapping, MD

## 2024-05-23 NOTE — Assessment & Plan Note (Signed)
 Stable without angina.  Continue clopidogrel  for antiplatelet therapy, evolocumab  and rosuvastatin  for lipid lowering.

## 2024-05-23 NOTE — Assessment & Plan Note (Signed)
 Normal function of her TAVR bioprosthesis with a mean gradient of 17 mmHg on most recent echo.  No paravalvular regurgitation.  Continue SBE prophylaxis when indicated.  I would like to see her back in 1 year with a surveillance echocardiogram prior to her return visit.

## 2024-05-23 NOTE — Progress Notes (Signed)
 Cardiology Office Note:    Date:  05/23/2024   ID:  SHERLYN EBBERT, DOB 1951-01-02, MRN [id number]  PCP:  Dwight Trula SQUIBB, MD    [facility]Care Providers Cardiologist:  Ozell Fell, MD     Referring MD: Dwight Trula SQUIBB, MD   Chief Complaint  Patient presents with   Follow-up    S/P TAVR    History of Present Illness:    Deanna Simmons is a 73 y.o. female with a hx of coronary artery disease and aortic stenosis status post TAVR, presenting for follow-up evaluation.  The patient underwent remote CABG in 2004 with a saphenous vein graft to PDA.  She was treated with TAVR 2022.  The patient is here alone today.  Her last echocardiogram from November 2024 showed normal LVEF of 60 to 65%, normal RV function, normal function of her TAVR prosthesis with a mean gradient of 17 mmHg, dimensionless index of 0.50, and no aortic insufficiency.  There is mild mitral regurgitation noted.  She has become quite limited from right knee pain with advanced arthritis.  She is seeing orthopedic surgery in May ultimately require total knee replacement.  She has had no recent cardiovascular complaints and specifically denies chest pain, chest pressure, shortness of breath, leg swelling, heart palpitations, orthopnea, or PND.  Unfortunately she has been out of work because of her knee problems.  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  Current Medications: Current Meds  Medication Sig   albuterol  (VENTOLIN  HFA) 108 (90 Base) MCG/ACT inhaler Inhale 2 puffs into the lungs every 4 (four) hours as needed for wheezing or shortness of breath.   azithromycin  (ZITHROMAX ) 500 MG tablet Take 1 tablet by mouth 1 hour prior to dental treatment.   beclomethasone (QVAR ) 80 MCG/ACT inhaler Inhale 1 puff into the lungs daily as needed (asthma).   cetirizine  (ZYRTEC ) 10 MG tablet Take 1 tablet (10 mg total) by mouth daily as needed for allergies (Can use an extra dose during flares).   clopidogrel  (PLAVIX ) 75 MG tablet Take  1 tablet (75 mg total) by mouth daily.   dapagliflozin  propanediol (FARXIGA ) 10 MG TABS tablet Take 1 tablet by mouth daily   Estradiol  10 MCG TABS vaginal tablet Place 1 tablet (10 mcg total) vaginally 2 (two) times a week as directed. (Patient taking differently: as needed (vaginal irritation).)   Evolocumab  (REPATHA  SURECLICK) 140 MG/ML SOAJ Inject 140 mg into the skin every 14 (fourteen) days.   famotidine  (PEPCID ) 40 MG tablet Take 1 tablet (40 mg total) by mouth daily as needed for heartburn or indigestion (Can add another dose during flare ups.).   ferrous sulfate  325 (65 FE) MG tablet Take 325 mg by mouth daily.   fluticasone  (FLONASE ) 50 MCG/ACT nasal spray Use 1 spray in each nostril 3-7 times per week depending on disease activity. (Patient taking differently: daily. Use 1 spray in each nostril 3-7 times per week depending on disease activity.)   Fluticasone  Furoate (ARNUITY ELLIPTA ) 200 MCG/ACT AEPB Inhale 1 puff into the lungs 2 (two) times daily. Use during asthma flare ups.   fluticasone -salmeterol (ADVAIR ) 250-50 MCG/ACT AEPB Inhale 1 puff into the lungs in the morning and at bedtime.   glucose blood test strip Use to test blood glucose twice daily before meals.   metoprolol  succinate (TOPROL -XL) 25 MG 24 hr tablet Take 1 tablet (25 mg total) by mouth daily.   Multiple Vitamins-Minerals (MULTIVITAL PO) Take 1 tablet by mouth daily.   nitroGLYCERIN  (NITROSTAT ) 0.4  MG SL tablet Place 0.4 mg under the tongue every 5 (five) minutes as needed for chest pain.   nystatin  (MYCOSTATIN /NYSTOP ) powder Apply to affected area 2 times a day as needed for 7 days (Patient taking differently: 1 Application 2 (two) times daily as needed (irritation).)   Probiotic Product (PROBIOTIC DAILY PO) Take 1 capsule by mouth at bedtime. Ultra Flora IB   rosuvastatin  (CRESTOR ) 40 MG tablet Take 1 tablet (40 mg total) by mouth daily.   Tiotropium Bromide  Monohydrate (SPIRIVA  RESPIMAT) 1.25 MCG/ACT AERS Take 2  Inhalations by mouth every morning. (Patient taking differently: Take 2 Inhalations by mouth as needed (sob).)   traMADol  (ULTRAM ) 50 MG tablet as needed for severe pain (pain score 7-10).   triamcinolone  (KENALOG ) 0.025 % cream Apply to affected area once a day as directed for 10 days. (Patient taking differently: Apply topically as needed (rash).)   triamcinolone  ointment (KENALOG ) 0.1 % Apply once a daily in the evening for 2-4 weeks (Patient taking differently: Apply 1 Application topically daily as needed (irritation).)   valACYclovir  (VALTREX ) 500 MG tablet TAKE 1 TABLET BY MOUTH TWICE DAILY AS NEEDED   vitamin C (ASCORBIC ACID) 500 MG tablet Take 500 mg by mouth daily.   Vitamin D-Vitamin K (VITAMIN K2-VITAMIN D3 PO) Take 1 tablet by mouth daily.   zinc  gluconate 50 MG tablet Take 50 mg by mouth every evening.   Current Facility-Administered Medications for the 05/23/24 encounter (Office Visit) with Wonda Sharper, MD  Medication   azithromycin  (ZITHROMAX ) tablet 500 mg     Allergies:   Cardiolite  [technetium-82m], Ciprofloxacin , Clarithromycin, Erythromycin base, Iodinated contrast media, Niacin, Niaspan [niacin er (antihyperlipidemic)], Other, Penicillins, Prilosec [omeprazole], Singulair  [montelukast ], and Ticlid [ticlopidine hcl]   ROS:   Please see the history of present illness.    All other systems reviewed and are negative.  EKGs/Labs/Other Studies Reviewed:    The following studies were reviewed today: Cardiac Studies & Procedures   ______________________________________________________________________________________________ CARDIAC CATHETERIZATION  CARDIAC CATHETERIZATION 06/11/2021  Conclusion   Ost RCA to Mid RCA lesion is 100% stenosed.   Prox Cx lesion is 30% stenosed.   Mid LM to Dist LM lesion is 20% stenosed.   Ost LAD lesion is 20% stenosed.   Origin lesion is 60% stenosed.   LV end diastolic pressure is moderately elevated.   LV end diastolic pressure is  normal.   Hemodynamic findings consistent with moderate pulmonary hypertension.   There is severe aortic valve stenosis.   There is moderate mitral valve stenosis and mild (2+) mitral regurgitation.  Severe aortic stenosis with mean gradient 36 mmHg and calculated aortic valve area 0.91 cm. 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  V wave 47 mmHg.  A simultaneous gradient across the mitral valve was not recorded (however, mean wedge pressure of 28 mmHg minus LVEDP 17 mmHg = 11 mmHg, likely overestimating gradient due to V wave). Total occlusion of RCA. Patent left main Patent LAD Patent circumflex Patent 74 year old SVG to distal RCA with ostial proximal 50 to 70% narrowing.  RECOMMENDATIONS:  Referred to the structural heart team for consideration of TAVR and opinion concerning mitral valve.  Findings Coronary Findings Diagnostic  Dominance: Right  Left Main Mid LM to Dist LM lesion is 20% stenosed.  Left Anterior Descending The vessel exhibits minimal luminal irregularities. Ost LAD lesion is 20% stenosed.  Left Circumflex Prox Cx lesion is 30% stenosed.  Right Coronary Artery The  vessel exhibits minimal luminal irregularities. Ost RCA to Mid RCA lesion is 100% stenosed. The lesion was previously treated .  Right Posterior Descending Artery The vessel exhibits minimal luminal irregularities.  Graft To RPDA Origin lesion is 60% stenosed.  Intervention  No interventions have been documented.   STRESS TESTS  NM MYOCAR MULTI W/SPECT W 06/18/2013  Narrative *RADIOLOGY REPORT*  Clinical Data:  Chest pain  MYOCARDIAL IMAGING WITH SPECT (REST AND PHARMACOLOGIC-STRESS - 2 DAY PROTOCOL) GATED LEFT VENTRICULAR WALL MOTION STUDY LEFT VENTRICULAR EJECTION FRACTION  Technique:  Standard myocardial SPECT imaging was performed after intravenous injection of 30 mCi Tc-59m sestamibi at  rest.  On a different day, intravenous infusion of  Lexiscan  was performed under supervision of the Cardiology staff.  At peak effect of the drug, 30 mCi Tc-3m sestamibi was injected intravenously and standard myocardial SPECT imaging was performed.  Quantitative gated imaging was also performed to evaluate left ventricular wall motion and estimate left ventricular ejection fraction.  Comparison:  None.  Findings:  Spect:  Allowing for breast attenuation artifact, there are no definite perfusion defects.  Wall motion:  Normal.  Ejection fraction:  59%.  End diastolic volume 91 ml.  End-systolic volume 37 ml.  IMPRESSION: Allowing for breast attenuation artifact, there are no perfusion defects.   Original Report Authenticated By: Rome Hall, M.D.   ECHOCARDIOGRAM  ECHOCARDIOGRAM COMPLETE 09/01/2023  Narrative ECHOCARDIOGRAM REPORT    Patient Name:   THANYA CEGIELSKI Date of Exam: 09/01/2023 Medical Rec #:  [id number]       Height:       64.0 in Accession #:    [id number]      Weight:       202.0 lb Date of Birth:  [birth year]       BSA:          1.965 m Patient Age:    72 years        BP:           112/55 mmHg Patient Gender: F               HR:           76 bpm. Exam Location:  [address]  Procedure: 2D Echo, Cardiac Doppler, Color Doppler and Strain Analysis  Indications:    Aortic valve disorder I35.9  History:        Patient has prior history of Echocardiogram examinations, most recent 07/08/2022. CAD, Prior CABG, Stroke; Risk Factors:Diabetes and Hypertension. Aortic Valve: 23 mm Sapien prosthetic, stented (TAVR) valve is present in the aortic position. Procedure Date: 07/13/21.  Sonographer:    Lauraine Pilot RDCS Referring Phys: [phone] Zykeriah Mathia   Sonographer Comments: Global longitudinal strain was attempted. IMPRESSIONS   1. S/P TAVR with normal mean gradient 17 mmHg, DI 0.50 and no AI. 2. Left ventricular ejection fraction, by estimation, is 60  to 65%. The left ventricle has normal function. The left ventricle has no regional wall motion abnormalities. Left ventricular diastolic function could not be evaluated. Elevated left atrial pressure. The average left ventricular global longitudinal strain is -19.8 %. The global longitudinal strain is normal. 3. Right ventricular systolic function is normal. The right ventricular size is normal. Tricuspid regurgitation signal is inadequate for assessing PA pressure. 4. Left atrial size was mildly dilated. 5. The mitral valve is normal in structure. Mild mitral valve regurgitation. No evidence of mitral stenosis. Severe mitral annular calcification. 6. The aortic valve has been repaired/replaced. Aortic valve regurgitation  is not visualized. No aortic stenosis is present. There is a 23 mm Sapien prosthetic (TAVR) valve present in the aortic position. Procedure Date: 07/13/21. Echo findings are consistent with normal structure and function of the aortic valve prosthesis. 7. The inferior vena cava is normal in size with greater than 50% respiratory variability, suggesting right atrial pressure of 3 mmHg.  Comparison(s): No significant change from prior study.  FINDINGS Left Ventricle: Left ventricular ejection fraction, by estimation, is 60 to 65%. The left ventricle has normal function. The left ventricle has no regional wall motion abnormalities. The average left ventricular global longitudinal strain is -19.8 %. The global longitudinal strain is normal. The left ventricular internal cavity size was normal in size. There is no left ventricular hypertrophy. Left ventricular diastolic function could not be evaluated due to mitral annular calcification (moderate or greater). Left ventricular diastolic function could not be evaluated. Elevated left atrial pressure.  Right Ventricle: The right ventricular size is normal. Right ventricular systolic function is normal. Tricuspid regurgitation signal is  inadequate for assessing PA pressure. The tricuspid regurgitant velocity is 2.16 m/s, and with an assumed right atrial pressure of 3 mmHg, the estimated right ventricular systolic pressure is 21.7 mmHg.  Left Atrium: Left atrial size was mildly dilated.  Right Atrium: Right atrial size was normal in size.  Pericardium: There is no evidence of pericardial effusion.  Mitral Valve: The mitral valve is normal in structure. Severe mitral annular calcification. Mild mitral valve regurgitation. No evidence of mitral valve stenosis.  Tricuspid Valve: The tricuspid valve is normal in structure. Tricuspid valve regurgitation is trivial. No evidence of tricuspid stenosis.  Aortic Valve: The aortic valve has been repaired/replaced. Aortic valve regurgitation is not visualized. No aortic stenosis is present. Aortic valve mean gradient measures 17.0 mmHg. Aortic valve peak gradient measures 29.1 mmHg. Aortic valve area, by VTI measures 1.90 cm. There is a 23 mm Sapien prosthetic, stented (TAVR) valve present in the aortic position. Procedure Date: 07/13/21. Echo findings are consistent with normal structure and function of the aortic valve prosthesis.  Pulmonic Valve: The pulmonic valve was normal in structure. Pulmonic valve regurgitation is trivial. No evidence of pulmonic stenosis.  Aorta: The aortic root is normal in size and structure.  Venous: The inferior vena cava is normal in size with greater than 50% respiratory variability, suggesting right atrial pressure of 3 mmHg.  IAS/Shunts: No atrial level shunt detected by color flow Doppler.  Additional Comments: S/P TAVR with normal mean gradient 17 mmHg, DI 0.50 and no AI.   LEFT VENTRICLE PLAX 2D LVIDd:         3.80 cm      Diastology LVIDs:         2.50 cm      LV e' medial:    4.68 cm/s LV PW:         1.00 cm      LV E/e' medial:  27.8 LV IVS:        1.10 cm      LV e' lateral:   8.81 cm/s LVOT diam:     2.20 cm      LV E/e' lateral:  14.8 LV SV:         117 LV SV Index:   60           2D Longitudinal Strain LVOT Area:     3.80 cm     2D Strain GLS Avg:     -19.8 %  LV  Volumes (MOD) LV vol d, MOD A2C: 94.8 ml LV vol d, MOD A4C: 105.0 ml LV vol s, MOD A2C: 35.5 ml LV vol s, MOD A4C: 43.1 ml LV SV MOD A2C:     59.3 ml LV SV MOD A4C:     105.0 ml LV SV MOD BP:      62.7 ml  RIGHT VENTRICLE RV S prime:     8.92 cm/s RVOT diam:      2.30 cm TAPSE (M-mode): 1.8 cm  LEFT ATRIUM             Index        RIGHT ATRIUM           Index LA diam:        4.20 cm 2.14 cm/m   RA Area:     12.80 cm LA Vol (A2C):   73.1 ml 37.21 ml/m  RA Volume:   28.40 ml  14.46 ml/m LA Vol (A4C):   70.0 ml 35.63 ml/m LA Biplane Vol: 71.8 ml 36.55 ml/m AORTIC VALVE                     PULMONIC VALVE AV Area (Vmax):    1.95 cm      PR End Diast Vel: 6.76 msec AV Area (Vmean):   1.90 cm AV Area (VTI):     1.90 cm AV Vmax:           269.67 cm/s AV Vmean:          198.333 cm/s AV VTI:            0.618 m AV Peak Grad:      29.1 mmHg AV Mean Grad:      17.0 mmHg LVOT Vmax:         138.00 cm/s LVOT Vmean:        99.200 cm/s LVOT VTI:          0.309 m LVOT/AV VTI ratio: 0.50  AORTA Ao Root diam: 3.10 cm Ao Asc diam:  3.50 cm  MITRAL VALVE                TRICUSPID VALVE MV Area (PHT): 3.37 cm     TR Peak grad:   18.7 mmHg MV Decel Time: 225 msec     TR Vmax:        216.00 cm/s MV E velocity: 130.00 cm/s MV A velocity: 190.00 cm/s  SHUNTS MV E/A ratio:  0.68         Systemic VTI:  0.31 m Systemic Diam: 2.20 cm Pulmonic Diam: 2.30 cm  Redell Shallow MD Electronically signed by Redell Shallow MD Signature Date/Time: 09/01/2023/3:31:34 PM    Final    MONITORS  LONG TERM MONITOR-LIVE TELEMETRY (3-14 DAYS) 05/25/2021  Narrative  Basic rhythm is NSR  Non sustained SVT < 10 beats. 4 instances identified.  No atrial fibrillation.  Mobitz 1 second degree AV block transient during sleep.  Rare PAC's and PVC's with  burden < 1% each.   Patch Wear Time:  11 days and 1 hours (2022-07-06T20:47:56-0400 to 2022-07-17T22:22:54-0400)  Patient had a min HR of 43 bpm, max HR of 176 bpm, and avg HR of 76 bpm. Predominant underlying rhythm was Sinus Rhythm. 4 Supraventricular Tachycardia runs occurred, the run with the fastest interval lasting 10 beats with a max rate of 176 bpm, the longest lasting 8 beats with an avg rate of 135 bpm. Second Degree AV Block-Mobitz I (Wenckebach) was  present. Isolated SVEs were rare (<1.0%), SVE Couplets were rare (<1.0%), and SVE Triplets were rare (<1.0%). Isolated VEs were rare (<1.0%), VE Couplets were rare (<1.0%), and no VE Triplets were present. Ventricular Bigeminy was present.   CT SCANS  CT CORONARY MORPH W/CTA COR W/SCORE 06/29/2021  Addendum 06/30/2021  7:31 PM ADDENDUM REPORT: 06/30/2021 19:29  CLINICAL DATA:  Severe Aortic Stenosis.  EXAM: Cardiac TAVR CT  TECHNIQUE: A non-contrast, gated CT scan was obtained with axial slices of 3 mm through the heart for aortic valve calcium  scoring. A 120 kV retrospective, gated, contrast cardiac scan was obtained. Gantry rotation speed was 250 msecs and collimation was 0.6 mm. Nitroglycerin  was not given. The 3D data set was reconstructed in 5% intervals of the 0-95% of the R-R cycle. Systolic and diastolic phases were analyzed on a dedicated workstation using MPR, MIP, and VRT modes. The patient received 100 cc of contrast.  FINDINGS: Image quality: Average.  PVC noted during the exam.  Noise artifact is: Limited.  Valve Morphology: The aortic valve is tricuspid with severe calcifications and restricted movement in systole. The calcifications appear diffuse.  Aortic Valve Calcium  score: 1696  Aortic annular dimension:  Phase assessed: 15%  Annular area: 409 mm2  Annular perimeter: 72.6 mm  Max diameter: 24.7 mm  Min diameter: 21.3 mm  Annular and subannular calcification: None.  Optimal coplanar  projection: LAO 3 CRA 22  Coronary Artery Height above Annulus:  Left Main: 17.7 mm  Right Coronary: 16.1 mm  Sinus of Valsalva Measurements:  Non-coronary: 27.5 mm  Right-coronary: 24.9 mm  Left-coronary: 28.1 mm  Average diameter: 27.0 mm  Sinus of Valsalva Height:  Non-coronary: 19.3 mm  Right-coronary: 24.0 mm  Left-coronary: 24.5 mm  Sinotubular Junction: 28 mm with a single severe protruding calcification noted.  Ascending Thoracic Aorta: 35 mm.  Coronary Arteries: Normal coronary origin. Right dominance. The study was performed without use of NTG and is insufficient for plaque evaluation. Please refer to recent cardiac catheterization for coronary assessment. 3-vessel coronary calcifications noted. SVG-RCA noted.  Cardiac Morphology:  Right Atrium: Right atrial size is within normal limits.  Right Ventricle: The right ventricular cavity is within normal limits.  Left Atrium: Left atrial size is normal in size with no left atrial appendage filling defect.  Left Ventricle: The ventricular cavity size is within normal limits. There are no stigmata of prior infarction. There is no abnormal filling defect.  Pulmonary arteries: Normal in size without proximal filling defect.  Pulmonary veins: Normal pulmonary venous drainage.  Pericardium: Normal thickness with no significant effusion or calcium  present.  Mitral Valve: The mitral valve is degenerative with moderate to severe annular calcium .  Extra-cardiac findings: See attached radiology report for non-cardiac structures.  IMPRESSION: 1. Tricuspid aortic valve with diffuse severe calcifications. Severe aortic stenosis with calcium  score 1696.  2. Difficult study with PVC noted during systole but annular measurements support a 23 mm Edwards Sapien 3 (409 mm2).  3. Severe protruding calcification noted at the STJ which should be considered for self expanding valve.  4. No significant annular  or subannular calcifications.  5. Sufficient coronary to annulus distance.  6. Optimal Fluoroscopic Angle for Delivery: LAO 3 CRA 22  Elmore T. Barbaraann, MD   Electronically Signed By: Darryle Barbaraann M.D. On: 06/30/2021 19:29  Narrative EXAM: OVER-READ INTERPRETATION  CT CHEST  The following report is an over-read performed by radiologist Dr. Toribio Aye of [facility] on 06/29/2021. This over-read does not  include interpretation of cardiac or coronary anatomy or pathology. The coronary calcium  score/coronary CTA interpretation by the cardiologist is attached.  COMPARISON:  None.  FINDINGS: Extracardiac findings will be described separately under dictation for contemporaneously obtained CTA chest, abdomen and pelvis.  IMPRESSION: Please see separate dictation for contemporaneously obtained CTA chest, abdomen and pelvis dated 06/29/2021 for full description of relevant extracardiac findings.  Electronically Signed: By: Toribio Aye M.D. On: 06/29/2021 11:47     ______________________________________________________________________________________________      EKG:        Recent Labs: 07/05/2023: ALT 19 07/11/2023: BUN 11; Creatinine, Ser 0.55; Potassium 3.9; Sodium 135 09/19/2023: Hemoglobin 11.9; Platelets 250  Recent Lipid Panel    Component Value Date/Time   CHOL 119 07/05/2023 0731   TRIG 67 07/05/2023 0731   HDL 60 07/05/2023 0731   CHOLHDL 2.0 07/05/2023 0731   CHOLHDL 3 03/11/2014 0841   VLDL 13.0 03/11/2014 0841   LDLCALC 45 07/05/2023 0731     Risk Assessment/Calculations:                Physical Exam:    VS:  BP 124/75 (BP Location: Right Arm)   Pulse 66   Ht 5' 4 (1.626 m)   Wt 197 lb (89.4 kg)   SpO2 97%   BMI 33.81 kg/m     Wt Readings from Last 3 Encounters:  05/23/24 197 lb (89.4 kg)  03/19/24 196 lb 14.4 oz (89.3 kg)  09/26/23 192 lb 1.6 oz (87.1 kg)     GEN:  Well nourished, well developed in no  acute distress HEENT: Normal NECK: No JVD; No carotid bruits LYMPHATICS: No lymphadenopathy CARDIAC: RRR, 2/6 SEM at the RUSB, no diastolic murmur RESPIRATORY:  Clear to auscultation without rales, wheezing or rhonchi  ABDOMEN: Soft, non-tender, non-distended MUSCULOSKELETAL:  No edema; No deformity  SKIN: Warm and dry NEUROLOGIC:  Alert and oriented x 3 PSYCHIATRIC:  Normal affect   Assessment & Plan Coronary artery disease of bypass graft of native heart with stable angina pectoris (HCC) Stable without angina.  Continue clopidogrel  for antiplatelet therapy, evolocumab  and rosuvastatin  for lipid lowering. S/P TAVR (transcatheter aortic valve replacement) Normal function of her TAVR bioprosthesis with a mean gradient of 17 mmHg on most recent echo.  No paravalvular regurgitation.  Continue SBE prophylaxis when indicated.  I would like to see her back in 1 year with a surveillance echocardiogram prior to her return visit. Mixed hyperlipidemia Treated with a PCSK9 inhibitor and high intensity statin drug.  Last lipids with an excellent response to therapy, LDL cholesterol 49. Essential hypertension Blood pressure is well-controlled.  Continue metoprolol  succinate. Preop cardiovascular exam The patient is clinically stable with no angina.  She has no symptoms of heart failure.  Her aortic TAVR prosthesis is functioning normally.  If she requires knee replacement surgery, she can proceed without further cardiac testing at low risk of cardiovascular complications.  She would be at acceptable risk of holding clopidogrel  for 5 to 7 days before surgery as needed.     Medication Adjustments/Labs and Tests Ordered: Current medicines are reviewed at length with the patient today.  Concerns regarding medicines are outlined above.  Orders Placed This Encounter  Procedures   ECHOCARDIOGRAM COMPLETE   No orders of the defined types were placed in this encounter.   Patient Instructions   Testing/Procedures: ECHO (in 1 year, prior to visit) Your physician has requested that you have an echocardiogram. Echocardiography is a painless test that uses sound  waves to create images of your heart. It provides your doctor with information about the size and shape of your heart and how well your heart's chambers and valves are working. This procedure takes approximately one hour. There are no restrictions for this procedure. Please do NOT wear cologne, perfume, aftershave, or lotions (deodorant is allowed). Please arrive 15 minutes prior to your appointment time.  Please note: We ask at that you not bring children with you during ultrasound (echo/ vascular) testing. Due to room size and safety concerns, children are not allowed in the ultrasound rooms during exams. Our front office staff cannot provide observation of children in our lobby area while testing is being conducted. An adult accompanying a patient to their appointment will only be allowed in the ultrasound room at the discretion of the ultrasound technician under special circumstances. We apologize for any inconvenience.  Follow-Up: At [facility], you and your health needs are our priority.  As part of our continuing mission to provide you with exceptional heart care, our providers are all part of one team.  This team includes your primary Cardiologist (physician) and Advanced Practice Providers or APPs (Physician Assistants and Nurse Practitioners) who all work together to provide you with the care you need, when you need it.  Your next appointment:   1 year(s)  Provider:   Ozell Fell, MD       Signed, Ozell Fell, MD  05/23/2024 5:07 PM    [facility]

## 2024-05-27 ENCOUNTER — Telehealth: Payer: Self-pay

## 2024-05-27 NOTE — Telephone Encounter (Signed)
.....     Pre-operative Risk Assessment    Patient Name: JOYCELINE MAIORINO  DOB: 05-11-1951 MRN: [id number]   Date of last office visit: 05/23/24 Date of next office visit: NONE   Request for Surgical Clearance    Procedure:  RIGHT 1ST METATARSOPHALANGEAL JOINT AND 2ND  Date of Surgery:  Clearance TBD                                Surgeon:  DEEPAK RAMANATHAN Surgeon's Group or Practice Name:  [facility] Phone number:  [phone] Fax number:  [phone]   Type of Clearance Requested:   - Medical  - Pharmacy:  Hold Clopidogrel  (Plavix )     Type of Anesthesia:  Not Indicated   Additional requests/questions:    Bonney Teressa Rumalda Ronal   05/27/2024, 2:59 PM

## 2024-05-29 NOTE — Telephone Encounter (Signed)
   Patient Name: Deanna Simmons  DOB: 1951/05/02 MRN: [id number]  Primary Cardiologist: Ozell Fell, MD  Chart reviewed as part of pre-operative protocol coverage. Given past medical history and time since last visit, based on ACC/AHA guidelines, Deanna Simmons is at acceptable risk for the planned procedure without further cardiovascular testing.   Per Dr. Fell 05/23/2024  The patient is clinically stable with no angina. She has no symptoms of heart failure. Her aortic TAVR prosthesis is functioning normally. If she requires knee replacement surgery, she can proceed without further cardiac testing at low risk of cardiovascular complications. She would be at acceptable risk of holding clopidogrel  for 5 to 7 days before surgery as needed.   The patient was advised that if she develops new symptoms prior to surgery to contact our office to arrange for a follow-up visit, and she verbalized understanding.  I will route this recommendation to the requesting party via Epic fax function and remove from pre-op pool.  Please call with questions.  Lamarr Satterfield, NP 05/29/2024, 11:25 AM

## 2024-05-30 DIAGNOSIS — J45909 Unspecified asthma, uncomplicated: Secondary | ICD-10-CM | POA: Diagnosis not present

## 2024-05-30 DIAGNOSIS — M2041 Other hammer toe(s) (acquired), right foot: Secondary | ICD-10-CM | POA: Diagnosis not present

## 2024-05-30 DIAGNOSIS — I2510 Atherosclerotic heart disease of native coronary artery without angina pectoris: Secondary | ICD-10-CM | POA: Diagnosis not present

## 2024-05-30 DIAGNOSIS — E1142 Type 2 diabetes mellitus with diabetic polyneuropathy: Secondary | ICD-10-CM | POA: Diagnosis not present

## 2024-05-30 DIAGNOSIS — M1711 Unilateral primary osteoarthritis, right knee: Secondary | ICD-10-CM | POA: Diagnosis not present

## 2024-05-30 DIAGNOSIS — M2011 Hallux valgus (acquired), right foot: Secondary | ICD-10-CM | POA: Diagnosis not present

## 2024-05-30 DIAGNOSIS — E119 Type 2 diabetes mellitus without complications: Secondary | ICD-10-CM | POA: Diagnosis not present

## 2024-05-31 DIAGNOSIS — I2510 Atherosclerotic heart disease of native coronary artery without angina pectoris: Secondary | ICD-10-CM | POA: Diagnosis not present

## 2024-05-31 DIAGNOSIS — E119 Type 2 diabetes mellitus without complications: Secondary | ICD-10-CM | POA: Diagnosis not present

## 2024-05-31 DIAGNOSIS — I10 Essential (primary) hypertension: Secondary | ICD-10-CM | POA: Diagnosis not present

## 2024-05-31 DIAGNOSIS — E1142 Type 2 diabetes mellitus with diabetic polyneuropathy: Secondary | ICD-10-CM | POA: Diagnosis not present

## 2024-06-01 ENCOUNTER — Other Ambulatory Visit: Payer: Self-pay | Admitting: Cardiovascular Disease

## 2024-06-01 DIAGNOSIS — Z8673 Personal history of transient ischemic attack (TIA), and cerebral infarction without residual deficits: Secondary | ICD-10-CM

## 2024-06-01 DIAGNOSIS — E782 Mixed hyperlipidemia: Secondary | ICD-10-CM

## 2024-06-01 DIAGNOSIS — I25708 Atherosclerosis of coronary artery bypass graft(s), unspecified, with other forms of angina pectoris: Secondary | ICD-10-CM

## 2024-06-18 DIAGNOSIS — I2510 Atherosclerotic heart disease of native coronary artery without angina pectoris: Secondary | ICD-10-CM | POA: Diagnosis not present

## 2024-06-18 DIAGNOSIS — I10 Essential (primary) hypertension: Secondary | ICD-10-CM | POA: Diagnosis not present

## 2024-06-18 DIAGNOSIS — M1611 Unilateral primary osteoarthritis, right hip: Secondary | ICD-10-CM | POA: Diagnosis not present

## 2024-06-18 DIAGNOSIS — E1142 Type 2 diabetes mellitus with diabetic polyneuropathy: Secondary | ICD-10-CM | POA: Diagnosis not present

## 2024-06-18 DIAGNOSIS — M1711 Unilateral primary osteoarthritis, right knee: Secondary | ICD-10-CM | POA: Diagnosis not present

## 2024-06-18 DIAGNOSIS — M329 Systemic lupus erythematosus, unspecified: Secondary | ICD-10-CM | POA: Diagnosis not present

## 2024-06-29 ENCOUNTER — Other Ambulatory Visit (HOSPITAL_COMMUNITY): Payer: Self-pay

## 2024-06-30 DIAGNOSIS — E119 Type 2 diabetes mellitus without complications: Secondary | ICD-10-CM | POA: Diagnosis not present

## 2024-06-30 DIAGNOSIS — E1142 Type 2 diabetes mellitus with diabetic polyneuropathy: Secondary | ICD-10-CM | POA: Diagnosis not present

## 2024-06-30 DIAGNOSIS — J45909 Unspecified asthma, uncomplicated: Secondary | ICD-10-CM | POA: Diagnosis not present

## 2024-06-30 DIAGNOSIS — I2510 Atherosclerotic heart disease of native coronary artery without angina pectoris: Secondary | ICD-10-CM | POA: Diagnosis not present

## 2024-07-02 ENCOUNTER — Other Ambulatory Visit (HOSPITAL_COMMUNITY): Payer: Self-pay

## 2024-07-02 ENCOUNTER — Other Ambulatory Visit: Payer: Self-pay | Admitting: Cardiovascular Disease

## 2024-07-03 ENCOUNTER — Other Ambulatory Visit (HOSPITAL_COMMUNITY): Payer: Self-pay

## 2024-07-03 MED ORDER — METOPROLOL SUCCINATE ER 25 MG PO TB24
25.0000 mg | ORAL_TABLET | Freq: Every day | ORAL | 3 refills | Status: AC
  Filled 2024-07-03: qty 90, 90d supply, fill #0
  Filled 2024-10-06: qty 90, 90d supply, fill #1
  Filled 2024-10-07: qty 90, 90d supply, fill #0

## 2024-07-04 DIAGNOSIS — Z96652 Presence of left artificial knee joint: Secondary | ICD-10-CM | POA: Diagnosis not present

## 2024-07-04 DIAGNOSIS — M1611 Unilateral primary osteoarthritis, right hip: Secondary | ICD-10-CM | POA: Diagnosis not present

## 2024-07-04 DIAGNOSIS — M1711 Unilateral primary osteoarthritis, right knee: Secondary | ICD-10-CM | POA: Diagnosis not present

## 2024-07-30 DIAGNOSIS — I2510 Atherosclerotic heart disease of native coronary artery without angina pectoris: Secondary | ICD-10-CM | POA: Diagnosis not present

## 2024-07-30 DIAGNOSIS — E119 Type 2 diabetes mellitus without complications: Secondary | ICD-10-CM | POA: Diagnosis not present

## 2024-07-30 DIAGNOSIS — E1142 Type 2 diabetes mellitus with diabetic polyneuropathy: Secondary | ICD-10-CM | POA: Diagnosis not present

## 2024-07-30 DIAGNOSIS — J45909 Unspecified asthma, uncomplicated: Secondary | ICD-10-CM | POA: Diagnosis not present

## 2024-08-06 ENCOUNTER — Encounter: Payer: Self-pay | Admitting: Cardiovascular Disease

## 2024-08-07 ENCOUNTER — Other Ambulatory Visit (HOSPITAL_COMMUNITY): Payer: Self-pay

## 2024-08-07 DIAGNOSIS — I10 Essential (primary) hypertension: Secondary | ICD-10-CM | POA: Diagnosis not present

## 2024-08-07 DIAGNOSIS — E119 Type 2 diabetes mellitus without complications: Secondary | ICD-10-CM | POA: Diagnosis not present

## 2024-08-07 DIAGNOSIS — I2510 Atherosclerotic heart disease of native coronary artery without angina pectoris: Secondary | ICD-10-CM | POA: Diagnosis not present

## 2024-08-07 DIAGNOSIS — E1142 Type 2 diabetes mellitus with diabetic polyneuropathy: Secondary | ICD-10-CM | POA: Diagnosis not present

## 2024-08-27 ENCOUNTER — Telehealth: Payer: Self-pay | Admitting: Pharmacy Technician

## 2024-08-27 DIAGNOSIS — I25708 Atherosclerosis of coronary artery bypass graft(s), unspecified, with other forms of angina pectoris: Secondary | ICD-10-CM

## 2024-08-27 DIAGNOSIS — Z8673 Personal history of transient ischemic attack (TIA), and cerebral infarction without residual deficits: Secondary | ICD-10-CM

## 2024-08-27 DIAGNOSIS — E782 Mixed hyperlipidemia: Secondary | ICD-10-CM

## 2024-08-27 DIAGNOSIS — E7800 Pure hypercholesterolemia, unspecified: Secondary | ICD-10-CM

## 2024-08-27 NOTE — Telephone Encounter (Signed)
 Hi, insurance is asking for labs in the last 120 days. Last labs found are from 12/18/23. Can she get updated lipid labs for this pa? Thank you   Pharmacy Patient Advocate Encounter   Received notification from Onbase that prior authorization for repatha  is required/requested.   Insurance verification completed.   The patient is insured through [facility] ADVANTAGE/RX ADVANCE.   Per test claim: PA required; PA started via CoverMyMeds. KEY [id number] . Please see clinical question(s) below that I am not finding the answer to in their chart and advise.

## 2024-08-30 ENCOUNTER — Other Ambulatory Visit (HOSPITAL_COMMUNITY): Payer: Self-pay

## 2024-08-30 DIAGNOSIS — E1142 Type 2 diabetes mellitus with diabetic polyneuropathy: Secondary | ICD-10-CM | POA: Diagnosis not present

## 2024-08-30 DIAGNOSIS — J45909 Unspecified asthma, uncomplicated: Secondary | ICD-10-CM | POA: Diagnosis not present

## 2024-08-30 DIAGNOSIS — I10 Essential (primary) hypertension: Secondary | ICD-10-CM | POA: Diagnosis not present

## 2024-08-30 DIAGNOSIS — E119 Type 2 diabetes mellitus without complications: Secondary | ICD-10-CM | POA: Diagnosis not present

## 2024-08-30 DIAGNOSIS — I2510 Atherosclerotic heart disease of native coronary artery without angina pectoris: Secondary | ICD-10-CM | POA: Diagnosis not present

## 2024-08-30 NOTE — Telephone Encounter (Signed)
I called and LVM for patient to call back.

## 2024-08-30 NOTE — Telephone Encounter (Signed)
 Patient is returning call.

## 2024-08-30 NOTE — Telephone Encounter (Signed)
 Pt made aware she will come for labs. Orders placed.

## 2024-09-02 ENCOUNTER — Other Ambulatory Visit (HOSPITAL_COMMUNITY): Payer: Self-pay

## 2024-09-03 DIAGNOSIS — E7800 Pure hypercholesterolemia, unspecified: Secondary | ICD-10-CM | POA: Diagnosis not present

## 2024-09-03 LAB — LIPID PANEL
Chol/HDL Ratio: 2.2 ratio (ref 0.0–4.4)
Cholesterol, Total: 135 mg/dL (ref 100–199)
HDL: 62 mg/dL (ref >39)
LDL Chol Calc (NIH): 60 mg/dL (ref 0–99)
Triglycerides: 61 mg/dL (ref 0–149)
VLDL Cholesterol Cal: 13 mg/dL (ref 5–40)

## 2024-09-04 NOTE — Telephone Encounter (Signed)
 Pharmacy Patient Advocate Encounter   Received notification from Onbase that prior authorization for repatha  is required/requested.   Insurance verification completed.   The patient is insured through [facility] ADVANTAGE/RX ADVANCE.   Per test claim: PA required; PA submitted to above mentioned insurance via Latent Key/confirmation #/EOC [id number] Status is pending

## 2024-09-04 NOTE — Telephone Encounter (Signed)
 Pharmacy Patient Advocate Encounter  Received notification from [employer]RX ADVANCE that Prior Authorization for repatha  has been APPROVED from 09/04/24 to 09/04/25   PA #/Case ID/Reference #: [id number]

## 2024-09-06 DIAGNOSIS — E1142 Type 2 diabetes mellitus with diabetic polyneuropathy: Secondary | ICD-10-CM | POA: Diagnosis not present

## 2024-09-06 DIAGNOSIS — I10 Essential (primary) hypertension: Secondary | ICD-10-CM | POA: Diagnosis not present

## 2024-09-06 DIAGNOSIS — E119 Type 2 diabetes mellitus without complications: Secondary | ICD-10-CM | POA: Diagnosis not present

## 2024-09-06 DIAGNOSIS — I2510 Atherosclerotic heart disease of native coronary artery without angina pectoris: Secondary | ICD-10-CM | POA: Diagnosis not present

## 2024-09-09 ENCOUNTER — Ambulatory Visit: Payer: Self-pay | Admitting: Pharmacist

## 2024-09-17 ENCOUNTER — Other Ambulatory Visit (HOSPITAL_COMMUNITY): Payer: Self-pay

## 2024-09-17 MED ORDER — REPATHA SURECLICK 140 MG/ML ~~LOC~~ SOAJ: 140.0000 mg | SUBCUTANEOUS | 3 refills | Status: AC

## 2024-09-17 NOTE — Telephone Encounter (Signed)
 Pt called to f/u on Repatha  and would like it sent to pharmacy as listed. Pt would like a c/b once its completed  Pt is due for a injection on Thursday    [id number] [address], [place] - [address]

## 2024-09-17 NOTE — Telephone Encounter (Signed)
 Patient made aware of approval. Rx sent

## 2024-09-17 NOTE — Addendum Note (Signed)
 Addended by: Draylen Lobue D on: 09/17/2024 04:26 PM   Modules accepted: Orders

## 2024-09-18 ENCOUNTER — Telehealth: Payer: Self-pay | Admitting: Cardiovascular Disease

## 2024-09-18 NOTE — Telephone Encounter (Signed)
 I spoke with patient and told her she should take this antibiotic prior to all dental appointments

## 2024-09-18 NOTE — Telephone Encounter (Signed)
 Pt c/o medication issue:  1. Name of Medication: azithromycin  (ZITHROMAX ) 500 MG tablet   2. How are you currently taking this medication (dosage and times per day)? None   3. Are you having a reaction (difficulty breathing--STAT)? None   4. What is your medication issue? Pt would like to know if she needs to take medication before dental cleaning today at 4 pm. Please advise.

## 2024-09-29 DIAGNOSIS — E1142 Type 2 diabetes mellitus with diabetic polyneuropathy: Secondary | ICD-10-CM | POA: Diagnosis not present

## 2024-09-29 DIAGNOSIS — I2510 Atherosclerotic heart disease of native coronary artery without angina pectoris: Secondary | ICD-10-CM | POA: Diagnosis not present

## 2024-09-29 DIAGNOSIS — E119 Type 2 diabetes mellitus without complications: Secondary | ICD-10-CM | POA: Diagnosis not present

## 2024-09-29 DIAGNOSIS — J45909 Unspecified asthma, uncomplicated: Secondary | ICD-10-CM | POA: Diagnosis not present

## 2024-10-01 DIAGNOSIS — M2041 Other hammer toe(s) (acquired), right foot: Secondary | ICD-10-CM | POA: Diagnosis not present

## 2024-10-01 DIAGNOSIS — M2011 Hallux valgus (acquired), right foot: Secondary | ICD-10-CM | POA: Diagnosis not present

## 2024-10-03 ENCOUNTER — Telehealth (HOSPITAL_BASED_OUTPATIENT_CLINIC_OR_DEPARTMENT_OTHER): Payer: Self-pay | Admitting: *Deleted

## 2024-10-03 ENCOUNTER — Telehealth (HOSPITAL_BASED_OUTPATIENT_CLINIC_OR_DEPARTMENT_OTHER): Payer: Self-pay

## 2024-10-03 NOTE — Telephone Encounter (Signed)
   Pre-operative Risk Assessment    Patient Name: Deanna Simmons  DOB: January 15, 1951 MRN: [id number]   Date of last office visit: 05/23/24 with Dr. Wonda Date of next office visit: NA  Request for Surgical Clearance    Procedure:  Right foot 2nd hammertoe correction with possible pinning  Date of Surgery:  Clearance TBD                                 Surgeon:  Dr. Barton Socks Group or Practice Name:  [facility] Phone number:  [phone] Fax number:  [phone]   Type of Clearance Requested:   - Medical  - Pharmacy:  Hold Clopidogrel  (Plavix ) not indicated   Type of Anesthesia:  General    Additional requests/questions:    [name] [name]   10/03/2024, 7:40 AM

## 2024-10-03 NOTE — Telephone Encounter (Signed)
 Primary Cardiologist:Michael Wonda, MD   Preoperative team, please contact this patient and set up a phone call appointment for further preoperative risk assessment. Please obtain consent and complete medication review. Thank you for your help.   I confirm that guidance regarding antiplatelet and oral anticoagulation therapy has been completed and, if necessary, noted below.  Per office protocol and pending no new or concerning symptoms at the time of the call, she may hold Plavix  for 5-7 days prior to procedure and should resume as soon as hemodynamically stable postoperatively.  I also confirmed the patient resides in the state of  [place] . As per [facility] Medical Board telemedicine laws, the patient must reside in the state in which the provider is licensed.   Deanna EMERSON Bane, NP-C  10/03/2024, 8:13 AM [address], [address] [place], [place] [address] Office [phone] Fax [phone]

## 2024-10-03 NOTE — Telephone Encounter (Signed)
 Pt has been scheduled tele preop appt 10/09/24. Med rec and consent are done. Per pt procedure not scheduled until clearance has been given.      Patient Consent for Virtual Visit        Deanna Simmons has provided verbal consent on 10/03/2024 for a virtual visit (video or telephone).   CONSENT FOR VIRTUAL VISIT FOR:  Deanna Simmons  By participating in this virtual visit I agree to the following:  I hereby voluntarily request, consent and authorize [facility] and its employed or contracted physicians, physician assistants, nurse practitioners or other licensed health care professionals (the Practitioner), to provide me with telemedicine health care services (the "Services) as deemed necessary by the treating Practitioner. I acknowledge and consent to receive the Services by the Practitioner via telemedicine. I understand that the telemedicine visit will involve communicating with the Practitioner through live audiovisual communication technology and the disclosure of certain medical information by electronic transmission. I acknowledge that I have been given the opportunity to request an in-person assessment or other available alternative prior to the telemedicine visit and am voluntarily participating in the telemedicine visit.  I understand that I have the right to withhold or withdraw my consent to the use of telemedicine in the course of my care at any time, without affecting my right to future care or treatment, and that the Practitioner or I may terminate the telemedicine visit at any time. I understand that I have the right to inspect all information obtained and/or recorded in the course of the telemedicine visit and may receive copies of available information for a reasonable fee.  I understand that some of the potential risks of receiving the Services via telemedicine include:  Delay or interruption in medical evaluation due to technological equipment failure or  disruption; Information transmitted may not be sufficient (e.g. poor resolution of images) to allow for appropriate medical decision making by the Practitioner; and/or  In rare instances, security protocols could fail, causing a breach of personal health information.  Furthermore, I acknowledge that it is my responsibility to provide information about my medical history, conditions and care that is complete and accurate to the best of my ability. I acknowledge that Practitioner's advice, recommendations, and/or decision may be based on factors not within their control, such as incomplete or inaccurate data provided by me or distortions of diagnostic images or specimens that may result from electronic transmissions. I understand that the practice of medicine is not an exact science and that Practitioner makes no warranties or guarantees regarding treatment outcomes. I acknowledge that a copy of this consent can be made available to me via my patient portal [facility] MyChart), or I can request a printed copy by calling the office of [facility].    I understand that my insurance will be billed for this visit.   I have read or had this consent read to me. I understand the contents of this consent, which adequately explains the benefits and risks of the Services being provided via telemedicine.  I have been provided ample opportunity to ask questions regarding this consent and the Services and have had my questions answered to my satisfaction. I give my informed consent for the services to be provided through the use of telemedicine in my medical care

## 2024-10-03 NOTE — Telephone Encounter (Signed)
 Pt has been scheduled tele preop appt 10/09/24. Med rec and consent are done. Per pt procedure not scheduled until clearance has been given.

## 2024-10-07 ENCOUNTER — Other Ambulatory Visit (HOSPITAL_COMMUNITY): Payer: Self-pay

## 2024-10-09 ENCOUNTER — Ambulatory Visit: Attending: Cardiology

## 2024-10-09 DIAGNOSIS — Z01810 Encounter for preprocedural cardiovascular examination: Secondary | ICD-10-CM

## 2024-10-09 NOTE — Progress Notes (Signed)
 Virtual Visit via Telephone Note   Because of Deanna Simmons co-morbid illnesses, she is at least at moderate risk for complications without adequate follow up.  This format is felt to be most appropriate for this patient at this time.  Due to technical limitations with video connection (technology), today's appointment will be conducted as an audio only telehealth visit, and Deanna Simmons verbally agreed to proceed in this manner.   All issues noted in this document were discussed and addressed.  No physical exam could be performed with this format.  Evaluation Performed:  Preoperative cardiovascular risk assessment _____________   Date:  10/09/2024   Patient ID:  Deanna Simmons, DOB August 16, 1951, MRN [id number] Patient Location:  Home Provider location:   Office  Primary Care Provider:  Dwight Trula SQUIBB, MD Primary Cardiologist:  Ozell Fell, MD  Chief Complaint / Patient Profile   73 y.o. y/o female with a h/o coronary artery disease, aortic stenosis status post TAVR, hyperlipidemia, hypertension who is pending right foot second hammertoe correction with possible pinning on date TBD with [facility] and presents today for telephonic preoperative cardiovascular risk assessment.  History of Present Illness    Deanna Simmons is a 73 y.o. female who presents via audio/video conferencing for a telehealth visit today.  Pt was last seen in cardiology clinic on 05/23/2024 by Dr. Fell.  At that time Deanna Simmons was doing well.  The patient is now pending procedure as outlined above. Since her last visit, she denies chest pain, shortness of breath, lower extremity edema, fatigue, palpitations, melena, hematuria, hemoptysis, diaphoresis, weakness, presyncope, syncope, orthopnea, and PND.  Today patient is doing well without acute cardiovascular concerns.  She continues to work without limitation or exertional symptoms.  She denies any symptoms concerning for angina.  She is able to  complete greater than 4 METS.  Past Medical History    Past Medical History:  Diagnosis Date   Abnormal finding on Pap smear, ASCUS    Anemia    Asthma    Chondromalacia of knee    Coronary artery disease    Diabetes mellitus without complication (HCC)    Diverticulum 12/11/2002   in cecum   Fibroids    h/o   GERD (gastroesophageal reflux disease)    H/O tinea cruris 02/28/2010   Heart murmur    History of CVA (cerebrovascular accident)    Hypercholesteremia    Hypertension    Lupus    Obesity    Pneumonia 07/01/2008   S/P TAVR (transcatheter aortic valve replacement) 07/13/2021   s/p TAVR w/ a 23mm Edwards S3U via the TF approach by Dr. Fell and Dr. Lucas.   Synovitis of knee    Systemic lupus erythematosus (HCC)    with nephritis   Vulvitis    h/o   Past Surgical History:  Procedure Laterality Date   CARDIAC CATHETERIZATION     CATARACT EXTRACTION, BILATERAL     CESAREAN SECTION     CORONARY ARTERY BYPASS GRAFT     Endometrial curettage  03/29/2000   INTRAOPERATIVE TRANSTHORACIC ECHOCARDIOGRAM Left 07/13/2021   Procedure: INTRAOPERATIVE TRANSTHORACIC ECHOCARDIOGRAM;  Surgeon: Fell Ozell, MD;  Location: [facility] OR;  Service: Open Heart Surgery;  Laterality: Left;   RIGHT/LEFT HEART CATH AND CORONARY/GRAFT ANGIOGRAPHY N/A 06/11/2021   Procedure: RIGHT/LEFT HEART CATH AND CORONARY/GRAFT ANGIOGRAPHY;  Surgeon: Claudene Victory ORN, MD;  Location: [facility] INVASIVE CV LAB;  Service: Cardiovascular;  Laterality: N/A;   ROTATOR CUFF REPAIR Right  TOTAL KNEE ARTHROPLASTY Left 07/10/2023   Procedure: TOTAL KNEE ARTHROPLASTY;  Surgeon: Melodi Lerner, MD;  Location: [facility] ORS;  Service: Orthopedics;  Laterality: Left;   TRANSCATHETER AORTIC VALVE REPLACEMENT, TRANSFEMORAL N/A 07/13/2021   Procedure: TRANSCATHETER AORTIC VALVE REPLACEMENT, TRANSFEMORAL;  Surgeon: Wonda Sharper, MD;  Location: [facility] OR;  Service: Open Heart Surgery;  Laterality: N/A;   TUBAL LIGATION     ULTRASOUND  GUIDANCE FOR VASCULAR ACCESS Bilateral 07/13/2021   Procedure: ULTRASOUND GUIDANCE FOR VASCULAR ACCESS;  Surgeon: Wonda Sharper, MD;  Location: [facility] OR;  Service: Open Heart Surgery;  Laterality: Bilateral;   UPPER GASTROINTESTINAL ENDOSCOPY  12/11/2002    Allergies  Allergies  Allergen Reactions   Cardiolite  [Technetium-26m] Itching   Ciprofloxacin  Itching   Clarithromycin Itching   Erythromycin Base Rash   Iodinated Contrast Media Itching    Pt has a potential contrast allergy; She states in 2012 her cardiologist told her she had a reaction during the study. We are unable to determine the study or confirm a reaction. The patient will have a 13 hr prep for future studies. Molecular Imaging states their injection does not cause any reactions.   Niacin Itching   Niaspan [Niacin Er (Antihyperlipidemic)] Other (See Comments)    flushing   Other Itching    Chemical Use And Stress Cardiolite  Testing (Uncoded)   Penicillins Hives, Itching and Rash    Reaction: over 10 years Tolerated Cephalosporin Date: 07/11/23.     Prilosec [Omeprazole] Itching   Singulair  [Montelukast ] Hives and Itching   Ticlid [Ticlopidine Hcl] Itching    Home Medications    Prior to Admission medications   Medication Sig Start Date End Date Taking? Authorizing Provider  albuterol  (VENTOLIN  HFA) 108 (90 Base) MCG/ACT inhaler Inhale 2 puffs into the lungs every 4 (four) hours as needed for wheezing or shortness of breath. 03/19/24   Kozlow, Camellia PARAS, MD  azithromycin  (ZITHROMAX ) 500 MG tablet Take 1 tablet by mouth 1 hour prior to dental treatment. 06/19/23     beclomethasone (QVAR ) 80 MCG/ACT inhaler Inhale 1 puff into the lungs daily as needed (asthma).    [provider]  cetirizine  (ZYRTEC ) 10 MG tablet Take 1 tablet (10 mg total) by mouth daily as needed for allergies (Can use an extra dose during flares). 03/19/24   Kozlow, Camellia PARAS, MD  clopidogrel  (PLAVIX ) 75 MG tablet Take 1 tablet (75 mg total) by  mouth daily. 05/15/24   Wonda Sharper, MD  dapagliflozin  propanediol (FARXIGA ) 10 MG TABS tablet Take 1 tablet by mouth daily 05/01/23     Estradiol  10 MCG TABS vaginal tablet Place 1 tablet (10 mcg total) vaginally 2 (two) times a week as directed. Patient taking differently: as needed (vaginal irritation). 10/17/23     Evolocumab  (REPATHA  SURECLICK) 140 MG/ML SOAJ Inject 140 mg into the skin every 14 (fourteen) days. 09/17/24   Wonda Sharper, MD  famotidine  (PEPCID ) 40 MG tablet Take 1 tablet (40 mg total) by mouth daily as needed for heartburn or indigestion (Can add another dose during flare ups.). 03/19/24   Kozlow, Camellia PARAS, MD  ferrous sulfate  325 (65 FE) MG tablet Take 325 mg by mouth daily.    [provider]  fluticasone  (FLONASE ) 50 MCG/ACT nasal spray Use 1 spray in each nostril 3-7 times per week depending on disease activity. Patient taking differently: daily. Use 1 spray in each nostril 3-7 times per week depending on disease activity. 03/19/24   Kozlow, Camellia PARAS, MD  Fluticasone  Furoate (ARNUITY ELLIPTA )  200 MCG/ACT AEPB Inhale 1 puff into the lungs 2 (two) times daily. Use during asthma flare ups. 03/19/24   Kozlow, Camellia PARAS, MD  fluticasone -salmeterol (ADVAIR ) 250-50 MCG/ACT AEPB Inhale 1 puff into the lungs in the morning and at bedtime. 03/19/24   Kozlow, Camellia PARAS, MD  glucose blood test strip Use to test blood glucose twice daily before meals. 02/07/22     metoprolol  succinate (TOPROL -XL) 25 MG 24 hr tablet Take 1 tablet (25 mg total) by mouth daily. 07/03/24   Wonda Sharper, MD  Multiple Vitamins-Minerals (MULTIVITAL PO) Take 1 tablet by mouth daily.    [provider]  nitroGLYCERIN  (NITROSTAT ) 0.4 MG SL tablet Place 0.4 mg under the tongue every 5 (five) minutes as needed for chest pain.    [provider]  nitroGLYCERIN  (NITROSTAT ) 0.4 MG SL tablet Place 1 tablet (0.4 mg total) under the tongue every 5 (five) minutes as needed for chest pain. 08/03/23 10/03/24   Wonda Sharper, MD  nystatin  (MYCOSTATIN /NYSTOP ) powder Apply to affected area 2 times a day as needed for 7 days Patient taking differently: 1 Application 2 (two) times daily as needed (irritation). 05/19/23     Probiotic Product (PROBIOTIC DAILY PO) Take 1 capsule by mouth at bedtime. Ultra Flora IB    [provider]  rosuvastatin  (CRESTOR ) 40 MG tablet Take 1 tablet (40 mg total) by mouth daily. 05/05/23   Wonda Sharper, MD  Tiotropium Bromide  Monohydrate (SPIRIVA  RESPIMAT) 1.25 MCG/ACT AERS Take 2 Inhalations by mouth every morning. Patient taking differently: Take 2 Inhalations by mouth as needed (sob). 03/19/24   Kozlow, Camellia PARAS, MD  traMADol  (ULTRAM ) 50 MG tablet as needed for severe pain (pain score 7-10). 05/20/24   [provider]  triamcinolone  (KENALOG ) 0.025 % cream Apply to affected area once a day as directed for 10 days. Patient taking differently: Apply topically as needed (rash). 12/11/23     triamcinolone  ointment (KENALOG ) 0.1 % Apply once a daily in the evening for 2-4 weeks Patient taking differently: Apply 1 Application topically daily as needed (irritation). 05/30/23     valACYclovir  (VALTREX ) 500 MG tablet TAKE 1 TABLET BY MOUTH TWICE DAILY AS NEEDED 12/19/22     vitamin C (ASCORBIC ACID) 500 MG tablet Take 500 mg by mouth daily.    [provider]  Vitamin D-Vitamin K (VITAMIN K2-VITAMIN D3 PO) Take 1 tablet by mouth daily.    [provider]  zinc  gluconate 50 MG tablet Take 50 mg by mouth every evening.    [provider]  metoprolol  tartrate (LOPRESSOR ) 25 MG tablet Take 1 tablet (25 mg total) by mouth 2 (two) times daily with a meal. 01/10/23 01/10/23      Physical Exam    Vital Signs:  Deanna Simmons does not have vital signs available for review today.  Given telephonic nature of communication, physical exam is limited. AAOx3. NAD. Normal affect.  Speech and respirations are unlabored.  Accessory Clinical Findings     None  Assessment & Plan    1.  Preoperative Cardiovascular Risk Assessment: According to the Revised Cardiac Risk Index (RCRI), her Perioperative Risk of Major Cardiac Event is (%): 0.9. Her Functional Capacity in METs is: 5.07 according to the [facility] Activity Status Index (DASI).  Therefore, based on ACC/AHA guidelines, patient would be at acceptable risk for the planned procedure without further cardiovascular testing. I will route this recommendation to the requesting party via Epic fax function.  The patient was advised  that if she develops new symptoms prior to surgery to contact our office to arrange for a follow-up visit, and she verbalized understanding.  She may hold Plavix  for 5-7 days prior to procedure and should resume as soon as hemodynamically stable postoperatively.   A copy of this note will be routed to requesting surgeon.  Time:   Today, I have spent 8 minutes with the patient with telehealth technology discussing medical history, symptoms, and management plan.     Lum LITTIE Louis, NP  10/09/2024, 11:08 AM

## 2024-10-29 ENCOUNTER — Other Ambulatory Visit: Payer: Self-pay | Admitting: Cardiovascular Disease

## 2024-10-29 ENCOUNTER — Other Ambulatory Visit (HOSPITAL_COMMUNITY): Payer: Self-pay

## 2024-10-30 ENCOUNTER — Telehealth: Payer: Self-pay | Admitting: *Deleted

## 2024-10-30 ENCOUNTER — Other Ambulatory Visit (HOSPITAL_COMMUNITY): Payer: Self-pay

## 2024-10-30 MED ORDER — ROSUVASTATIN CALCIUM 40 MG PO TABS
40.0000 mg | ORAL_TABLET | Freq: Every day | ORAL | 2 refills | Status: AC
  Filled 2024-10-30: qty 90, 90d supply, fill #0

## 2024-10-30 NOTE — Telephone Encounter (Signed)
 FMLA paperwork has been filled out and placed on Dr. Rowan desk for him to review and sign. Patient has an office visit on 11/05/2024. We can fax her FMLA forms for her and give her a copy of the forms at that time.

## 2024-11-02 ENCOUNTER — Other Ambulatory Visit: Payer: Self-pay | Admitting: Allergy and Immunology

## 2024-11-04 ENCOUNTER — Other Ambulatory Visit: Payer: Self-pay | Admitting: Internal Medicine

## 2024-11-04 DIAGNOSIS — Z1231 Encounter for screening mammogram for malignant neoplasm of breast: Secondary | ICD-10-CM

## 2024-11-05 ENCOUNTER — Other Ambulatory Visit: Payer: Self-pay

## 2024-11-05 ENCOUNTER — Other Ambulatory Visit (HOSPITAL_COMMUNITY): Payer: Self-pay

## 2024-11-05 ENCOUNTER — Encounter: Payer: Self-pay | Admitting: Allergy and Immunology

## 2024-11-05 ENCOUNTER — Ambulatory Visit (INDEPENDENT_AMBULATORY_CARE_PROVIDER_SITE_OTHER): Admitting: Allergy and Immunology

## 2024-11-05 VITALS — BP 110/78 | HR 84 | Temp 98.4°F | Resp 16 | Ht 62.0 in | Wt 195.2 lb

## 2024-11-05 DIAGNOSIS — K219 Gastro-esophageal reflux disease without esophagitis: Secondary | ICD-10-CM

## 2024-11-05 DIAGNOSIS — J4540 Moderate persistent asthma, uncomplicated: Secondary | ICD-10-CM | POA: Diagnosis not present

## 2024-11-05 DIAGNOSIS — J3089 Other allergic rhinitis: Secondary | ICD-10-CM

## 2024-11-05 DIAGNOSIS — R040 Epistaxis: Secondary | ICD-10-CM | POA: Diagnosis not present

## 2024-11-05 DIAGNOSIS — J301 Allergic rhinitis due to pollen: Secondary | ICD-10-CM | POA: Diagnosis not present

## 2024-11-05 MED ORDER — FAMOTIDINE 40 MG PO TABS
40.0000 mg | ORAL_TABLET | Freq: Two times a day (BID) | ORAL | 1 refills | Status: AC
  Filled 2024-11-05: qty 180, 90d supply, fill #0

## 2024-11-05 MED ORDER — SPIRIVA RESPIMAT 1.25 MCG/ACT IN AERS
2.0000 | INHALATION_SPRAY | Freq: Every day | RESPIRATORY_TRACT | 2 refills | Status: AC
  Filled 2024-11-05: qty 4, 30d supply, fill #0

## 2024-11-05 MED ORDER — FLUTICASONE PROPIONATE 50 MCG/ACT NA SUSP
NASAL | 1 refills | Status: AC
  Filled 2024-11-05: qty 48, 90d supply, fill #0

## 2024-11-05 MED ORDER — FLUTICASONE-SALMETEROL 250-50 MCG/ACT IN AEPB
1.0000 | INHALATION_SPRAY | Freq: Two times a day (BID) | RESPIRATORY_TRACT | 5 refills | Status: AC
  Filled 2024-11-05: qty 60, 30d supply, fill #0

## 2024-11-05 MED ORDER — CETIRIZINE HCL 10 MG PO TABS
10.0000 mg | ORAL_TABLET | Freq: Every day | ORAL | 1 refills | Status: AC | PRN
  Filled 2024-11-05: qty 90, 90d supply, fill #0

## 2024-11-05 MED ORDER — ALBUTEROL SULFATE HFA 108 (90 BASE) MCG/ACT IN AERS
2.0000 | INHALATION_SPRAY | RESPIRATORY_TRACT | 1 refills | Status: AC | PRN
  Filled 2024-11-05: qty 6.7, 30d supply, fill #0

## 2024-11-05 NOTE — Progress Notes (Unsigned)
 "  [place] [facility] [place] - [place] - [place] - [place]   Follow-up Note  Referring Provider: Dwight Trula SQUIBB, MD Primary Provider: Dwight Trula SQUIBB, MD Date of Office Visit: 11/05/2024  Subjective:   Deanna Simmons (DOB: [birth year]) is a 74 y.o. female who [facility] in re-evaluation of the following:  HPI: Deanna Simmons returns to this clinic in evaluation of asthma, allergic rhinitis, LPR.  I last saw her in this clinic 19 Mar 2024.  She has really done very well since her last visit regarding control of her airway issue while using her Advair  mostly 1 time per day and her Flonase  a few times per week.  She did have a nasal bleed recently from her right nostril.  She has not had an exacerbation of her asthma and has had to activate her action plan.  She has not required a systemic steroid or an antibiotic for any type of airway issue.  Her reflux does okay but when she wakes up in the morning she does have some regurgitation and has some throat clearing.  She is using her famotidine  in the morning.  She is not using any famotidine  in the evening.  She did receive this years flu vaccine.  She will be having removal of a corn from her right #2 toe mid February 2025.  Allergies as of 11/05/2024       Reactions   Cardiolite  [technetium-2m] Itching   Ciprofloxacin  Itching   Clarithromycin Itching   Erythromycin Base Rash   Iodinated Contrast Media Itching   Pt has a potential contrast allergy; She states in 2012 her cardiologist told her she had a reaction during the study. We are unable to determine the study or confirm a reaction. The patient will have a 13 hr prep for future studies. Molecular Imaging states their injection does not cause any reactions.   Niacin Itching   Niaspan [niacin Er (antihyperlipidemic)] Other (See Comments)   flushing   Other Itching   Chemical Use And Stress Cardiolite  Testing (Uncoded)   Penicillins Hives,  Itching, Rash   Reaction: over 10 years Tolerated Cephalosporin Date: 07/11/23.   Prilosec [omeprazole] Itching   Singulair  [montelukast ] Hives, Itching   Ticlid [ticlopidine Hcl] Itching        Medication List    albuterol  108 (90 Base) MCG/ACT inhaler Commonly known as: Ventolin  HFA Inhale 2 puffs into the lungs every 4 (four) hours as needed for wheezing or shortness of breath.   Arnuity Ellipta  200 MCG/ACT Aepb Generic drug: Fluticasone  Furoate Inhale 1 puff into the lungs 2 (two) times daily. Use during asthma flare ups.   ascorbic acid 500 MG tablet Commonly known as: VITAMIN C Take 500 mg by mouth daily.   azithromycin  500 MG tablet Commonly known as: ZITHROMAX  Take 1 tablet by mouth 1 hour prior to dental treatment.   beclomethasone 80 MCG/ACT inhaler Commonly known as: QVAR  Inhale 1 puff into the lungs daily as needed (asthma).   cetirizine  10 MG tablet Commonly known as: ZYRTEC  Take 1 tablet (10 mg total) by mouth daily as needed for allergies (Can use an extra dose during flares).   clopidogrel  75 MG tablet Commonly known as: PLAVIX  Take 1 tablet (75 mg total) by mouth daily.   Estradiol  10 MCG Tabs vaginal tablet Place 1 tablet (10 mcg total) vaginally 2 (two) times a week as directed.   famotidine  40 MG tablet Commonly known as: PEPCID  TAKE 1 TABLET  BY MOUTH EVERY DAY AS NEEDED FOR HEARTBURN OR INDIGESTION. CAN ADD ADDITIONAL DOSE DURING FLARE UPS   Farxiga  10 MG Tabs tablet Generic drug: dapagliflozin  propanediol Take 1 tablet by mouth daily   ferrous sulfate  325 (65 FE) MG tablet Take 325 mg by mouth daily.   fluticasone  50 MCG/ACT nasal spray Commonly known as: FLONASE  Use 1 spray in each nostril 3-7 times per week depending on disease activity.   fluticasone -salmeterol 250-50 MCG/ACT Aepb Commonly known as: ADVAIR  Inhale 1 puff into the lungs in the morning and at bedtime.   glucose blood test strip Use to test blood glucose twice daily  before meals.   metoprolol  succinate 25 MG 24 hr tablet Commonly known as: TOPROL -XL Take 1 tablet (25 mg total) by mouth daily.   MULTIVITAL PO Take 1 tablet by mouth daily.   nitroGLYCERIN  0.4 MG SL tablet Commonly known as: NITROSTAT  Place 0.4 mg under the tongue every 5 (five) minutes as needed for chest pain.   nitroGLYCERIN  0.4 MG SL tablet Commonly known as: NITROSTAT  Place 1 tablet (0.4 mg total) under the tongue every 5 (five) minutes as needed for chest pain.   nystatin  powder Commonly known as: MYCOSTATIN /NYSTOP  Apply to affected area 2 times a day as needed for 7 days   PROBIOTIC DAILY PO Take 1 capsule by mouth at bedtime. Ultra Flora IB   Repatha  SureClick 140 MG/ML Soaj Generic drug: Evolocumab  Inject 140 mg into the skin every 14 (fourteen) days.   rosuvastatin  40 MG tablet Commonly known as: CRESTOR  Take 1 tablet (40 mg total) by mouth daily.   Spiriva  Respimat 1.25 MCG/ACT Aers Generic drug: Tiotropium Bromide  Take 2 Inhalations by mouth every morning.   traMADol  50 MG tablet Commonly known as: ULTRAM  as needed for severe pain (pain score 7-10).   triamcinolone  ointment 0.1 % Commonly known as: KENALOG  Apply once a daily in the evening for 2-4 weeks   triamcinolone  0.025 % cream Commonly known as: KENALOG  Apply to affected area once a day as directed for 10 days.   valACYclovir  500 MG tablet Commonly known as: VALTREX  TAKE 1 TABLET BY MOUTH TWICE DAILY AS NEEDED   VITAMIN K2-VITAMIN D3 PO Take 1 tablet by mouth daily.   zinc  gluconate 50 MG tablet Take 50 mg by mouth every evening.    Past Medical History:  Diagnosis Date   Abnormal finding on Pap smear, ASCUS    Anemia    Asthma    Chondromalacia of knee    Coronary artery disease    Diabetes mellitus without complication (HCC)    Diverticulum 12/11/2002   in cecum   Fibroids    h/o   GERD (gastroesophageal reflux disease)    H/O tinea cruris 02/28/2010   Heart murmur     History of CVA (cerebrovascular accident)    Hypercholesteremia    Hypertension    Lupus    Obesity    Pneumonia 07/01/2008   S/P TAVR (transcatheter aortic valve replacement) 07/13/2021   s/p TAVR w/ a 23mm Edwards S3U via the TF approach by Dr. Wonda and Dr. Lucas.   Synovitis of knee    Systemic lupus erythematosus (HCC)    with nephritis   Vulvitis    h/o    Past Surgical History:  Procedure Laterality Date   CARDIAC CATHETERIZATION     CATARACT EXTRACTION, BILATERAL     CESAREAN SECTION     CORONARY ARTERY BYPASS GRAFT     Endometrial curettage  03/29/2000  INTRAOPERATIVE TRANSTHORACIC ECHOCARDIOGRAM Left 07/13/2021   Procedure: INTRAOPERATIVE TRANSTHORACIC ECHOCARDIOGRAM;  Surgeon: Wonda Sharper, MD;  Location: [facility] OR;  Service: Open Heart Surgery;  Laterality: Left;   RIGHT/LEFT HEART CATH AND CORONARY/GRAFT ANGIOGRAPHY N/A 06/11/2021   Procedure: RIGHT/LEFT HEART CATH AND CORONARY/GRAFT ANGIOGRAPHY;  Surgeon: Claudene Victory ORN, MD;  Location: [facility] INVASIVE CV LAB;  Service: Cardiovascular;  Laterality: N/A;   ROTATOR CUFF REPAIR Right    TOTAL KNEE ARTHROPLASTY Left 07/10/2023   Procedure: TOTAL KNEE ARTHROPLASTY;  Surgeon: Melodi Lerner, MD;  Location: [facility] ORS;  Service: Orthopedics;  Laterality: Left;   TRANSCATHETER AORTIC VALVE REPLACEMENT, TRANSFEMORAL N/A 07/13/2021   Procedure: TRANSCATHETER AORTIC VALVE REPLACEMENT, TRANSFEMORAL;  Surgeon: Wonda Sharper, MD;  Location: [facility] OR;  Service: Open Heart Surgery;  Laterality: N/A;   TUBAL LIGATION     ULTRASOUND GUIDANCE FOR VASCULAR ACCESS Bilateral 07/13/2021   Procedure: ULTRASOUND GUIDANCE FOR VASCULAR ACCESS;  Surgeon: Wonda Sharper, MD;  Location: [facility] OR;  Service: Open Heart Surgery;  Laterality: Bilateral;   UPPER GASTROINTESTINAL ENDOSCOPY  12/11/2002    Review of systems negative except as noted in HPI / PMHx or noted below:  Review of Systems  Constitutional: Negative.   HENT: Negative.    Eyes:  Negative.   Respiratory: Negative.    Cardiovascular: Negative.   Gastrointestinal: Negative.   Genitourinary: Negative.   Musculoskeletal: Negative.   Skin: Negative.   Neurological: Negative.   Endo/Heme/Allergies: Negative.   Psychiatric/Behavioral: Negative.       Objective:   Vitals:   11/05/24 1638  BP: 110/78  Pulse: 84  Resp: 16  Temp: 98.4 F (36.9 C)  SpO2: 98%   Height: 5' 2 (157.5 cm)  Weight: 195 lb 3.2 oz (88.5 kg)   Physical Exam Constitutional:      Appearance: She is not diaphoretic.  HENT:     Head: Normocephalic.     Right Ear: Tympanic membrane, ear canal and external ear normal.     Left Ear: Tympanic membrane, ear canal and external ear normal.     Nose: Nose normal. No mucosal edema or rhinorrhea.     Mouth/Throat:     Pharynx: Uvula midline. No oropharyngeal exudate.  Eyes:     Conjunctiva/sclera: Conjunctivae normal.  Neck:     Thyroid : No thyromegaly.     Trachea: Trachea normal. No tracheal tenderness or tracheal deviation.  Cardiovascular:     Rate and Rhythm: Normal rate and regular rhythm.     Heart sounds: Normal heart sounds, S1 normal and S2 normal. No murmur heard. Pulmonary:     Effort: No respiratory distress.     Breath sounds: Normal breath sounds. No stridor. No wheezing or rales.  Lymphadenopathy:     Head:     Right side of head: No tonsillar adenopathy.     Left side of head: No tonsillar adenopathy.     Cervical: No cervical adenopathy.  Skin:    Findings: No erythema or rash.     Nails: There is no clubbing.  Neurological:     Mental Status: She is alert.     Diagnostics: Spirometry was performed and demonstrated an FEV1 of 2.11 at 125 % of predicted.  Assessment and Plan:   1. Asthma, moderate persistent, well-controlled   2. Perennial allergic rhinitis   3. Seasonal allergic rhinitis due to pollen   4. LPRD (laryngopharyngeal reflux disease)   5. Epistaxis    1.  Continue Advair  250 1 inhalation 1-2  times per  day    2.  Continue Flonase  1 spray each nostril 3-7 times per week  (none x 10 days right nostril)  3.  Continue Famotidine  40 mg tablet 1-2 times per day  4.  Action plan for asthma flare:   A. Albuterol  2 inhalations + Arnuity 200 1 inhalations - every 4-6 hours  B. Spiriva  1.25 Respimat - 2 inhalations 1 time per day   5. If needed:  A. Zyrtec  10 mg one tablet one time per day  B. Nasal saline  6. Influenza = Tamiflu. Covid = molnupiravir   7. Return to clinic in 6 months or earlier if problem  Deanna Simmons appears to be doing quite well with her airway issue and she has a very good understanding of her disease state and how her medications work and appropriate dosing of her medications depending on disease activity.  Her reflux is a little active and she can increase her famotidine  to twice a day if needed.  There is a selection of agents that she can utilize should they be required.  I encouraged her not to use any Flonase  in her right nostril at least for the next 10 days as she did have an episode of epistaxis in that nostril.  If she does well I will see her in this clinic in 6 months or earlier if there is a problem.    Deanna Denis, MD Allergy / Immunology [facility] "

## 2024-11-05 NOTE — Telephone Encounter (Signed)
 Patient's FMLA paperwork has been signed by Dr. Kozlow and faxed to [facility]. A copy has been made and has been placed in bulk scanning. The original has been given to the patient.

## 2024-11-05 NOTE — Patient Instructions (Addendum)
" °  1.  Continue Advair  250 1 inhalation 1-2 times per day    2.  Continue Flonase  1 spray each nostril 3-7 times per week  (none x 10 days right nostril)  3.  Continue Famotidine  40 mg tablet 1-2 times per day  4.  Action plan for asthma flare:   A. Albuterol  2 inhalations + Arnuity 200 1 inhalations - every 4-6 hours  B. Spiriva  1.25 Respimat - 2 inhalations 1 time per day   5. If needed:  A. Zyrtec  10 mg one tablet one time per day  B. Nasal saline  6. Influenza = Tamiflu. Covid = molnupiravir   7. Return to clinic in 6 months or earlier if problem          "

## 2024-11-06 ENCOUNTER — Other Ambulatory Visit (HOSPITAL_COMMUNITY): Payer: Self-pay

## 2024-11-06 ENCOUNTER — Encounter: Payer: Self-pay | Admitting: Allergy and Immunology

## 2024-11-06 ENCOUNTER — Other Ambulatory Visit: Payer: Self-pay

## 2024-11-08 ENCOUNTER — Ambulatory Visit
Admission: RE | Admit: 2024-11-08 | Discharge: 2024-11-08 | Disposition: A | Source: Ambulatory Visit | Attending: Internal Medicine | Admitting: Internal Medicine

## 2024-11-08 DIAGNOSIS — Z1231 Encounter for screening mammogram for malignant neoplasm of breast: Secondary | ICD-10-CM

## 2024-11-15 ENCOUNTER — Other Ambulatory Visit (HOSPITAL_COMMUNITY): Payer: Self-pay

## 2024-12-04 ENCOUNTER — Other Ambulatory Visit: Payer: Self-pay

## 2024-12-04 ENCOUNTER — Encounter (HOSPITAL_BASED_OUTPATIENT_CLINIC_OR_DEPARTMENT_OTHER): Payer: Self-pay | Admitting: Orthopaedic Surgery

## 2024-12-04 NOTE — Progress Notes (Signed)
 Reviewed chart with Dr. Cleotilde, ok for [facility]

## 2024-12-04 NOTE — Progress Notes (Signed)
" °   12/04/24 1506  PAT Phone Screen  Is the patient taking a GLP-1 receptor agonist? No  Do You Have Diabetes? (S)  Yes  Do You Have Hypertension? (S)  Yes  Have You Ever Been to the ER for Asthma? No  Have You Taken Oral Steroids in the Past 3 Months? No  Do you Take Phenteramine or any Other Diet Drugs? No  Recent  Lab Work, EKG, CXR? No  Do you have a history of heart problems? (S)  Yes (TAVR prosthesis 2022 for CVA)  Cardiologist Name Lum Louis, NP  Have you ever had tests on your heart? (S)  Yes  What cardiac tests were performed? (S)  Echo  What date/year were cardiac tests completed? (S)  Echo 2024  Results viewable: [facility] Media Tab;Paper Copy in Chart  Any Recent Hospitalizations? No  Height 5' 5 (1.651 m)  Weight 88.9 kg  Pat Appointment Scheduled (S)  Yes (BMP, EKG)   Cardiac clearance 12/10, plavix  hold 5-7 days before, reviewed chart with Dr. Cleotilde.  "

## 2024-12-05 ENCOUNTER — Other Ambulatory Visit: Payer: Self-pay | Admitting: Cardiovascular Disease

## 2024-12-06 ENCOUNTER — Encounter (HOSPITAL_BASED_OUTPATIENT_CLINIC_OR_DEPARTMENT_OTHER): Admission: RE | Admit: 2024-12-06 | Source: Ambulatory Visit

## 2024-12-06 LAB — BASIC METABOLIC PANEL WITH GFR
Anion gap: 11 (ref 5–15)
BUN: 12 mg/dL (ref 8–23)
CO2: 24 mmol/L (ref 22–32)
Calcium: 9.5 mg/dL (ref 8.9–10.3)
Chloride: 105 mmol/L (ref 98–111)
Creatinine, Ser: 0.78 mg/dL (ref 0.44–1.00)
GFR, Estimated: >60 mL/min
Glucose, Bld: 103 mg/dL — ABNORMAL HIGH (ref 70–99)
Potassium: 5.2 mmol/L — ABNORMAL HIGH (ref 3.5–5.1)
Sodium: 140 mmol/L (ref 135–145)

## 2024-12-11 ENCOUNTER — Ambulatory Visit (HOSPITAL_BASED_OUTPATIENT_CLINIC_OR_DEPARTMENT_OTHER): Admit: 2024-12-11 | Admitting: Orthopaedic Surgery

## 2024-12-11 ENCOUNTER — Encounter (HOSPITAL_BASED_OUTPATIENT_CLINIC_OR_DEPARTMENT_OTHER): Payer: Self-pay

## 2024-12-11 DIAGNOSIS — I10 Essential (primary) hypertension: Secondary | ICD-10-CM

## 2024-12-11 DIAGNOSIS — E1159 Type 2 diabetes mellitus with other circulatory complications: Secondary | ICD-10-CM

## 2025-05-06 ENCOUNTER — Ambulatory Visit: Admitting: Allergy and Immunology

## 2025-05-23 ENCOUNTER — Other Ambulatory Visit (HOSPITAL_COMMUNITY)
